# Patient Record
Sex: Female | Born: 1960 | Race: Black or African American | Hispanic: No | Smoking: Never smoker
Health system: Southern US, Community
[De-identification: ages and names within clinical notes are randomized; demographics above are authoritative.]

## PROBLEM LIST (undated history)

## (undated) DIAGNOSIS — E785 Hyperlipidemia, unspecified: Secondary | ICD-10-CM

## (undated) DIAGNOSIS — M5136 Other intervertebral disc degeneration, lumbar region: Secondary | ICD-10-CM

## (undated) DIAGNOSIS — H15009 Unspecified scleritis, unspecified eye: Secondary | ICD-10-CM

## (undated) DIAGNOSIS — M5030 Other cervical disc degeneration, unspecified cervical region: Secondary | ICD-10-CM

## (undated) DIAGNOSIS — E119 Type 2 diabetes mellitus without complications: Secondary | ICD-10-CM

## (undated) DIAGNOSIS — J302 Other seasonal allergic rhinitis: Secondary | ICD-10-CM

## (undated) DIAGNOSIS — I10 Essential (primary) hypertension: Secondary | ICD-10-CM

## (undated) DIAGNOSIS — J45909 Unspecified asthma, uncomplicated: Secondary | ICD-10-CM

## (undated) DIAGNOSIS — M069 Rheumatoid arthritis, unspecified: Secondary | ICD-10-CM

## (undated) DIAGNOSIS — K219 Gastro-esophageal reflux disease without esophagitis: Secondary | ICD-10-CM

## (undated) HISTORY — PX: KNEE ARTHROSCOPY: SHX127

## (undated) HISTORY — DX: Hyperlipidemia, unspecified: E78.5

## (undated) HISTORY — DX: Gastro-esophageal reflux disease without esophagitis: K21.9

## (undated) HISTORY — DX: Other cervical disc degeneration, unspecified cervical region: M50.30

## (undated) HISTORY — DX: Rheumatoid arthritis, unspecified: M06.9

## (undated) HISTORY — PX: KNEE ARTHROPLASTY: SHX992

## (undated) HISTORY — DX: Other intervertebral disc degeneration, lumbar region: M51.36

## (undated) HISTORY — PX: CATARACT EXTRACTION W/ INTRAOCULAR LENS  IMPLANT, BILATERAL: SHX1307

## (undated) HISTORY — DX: Unspecified scleritis, unspecified eye: H15.009

## (undated) HISTORY — PX: MYOMECTOMY: SHX85

---
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
## 2006-02-17 ENCOUNTER — Emergency Department (HOSPITAL_COMMUNITY): Admission: EM | Admit: 2006-02-17 | Discharge: 2006-02-17 | Payer: Self-pay | Admitting: Emergency Medicine

## 2009-05-07 ENCOUNTER — Encounter: Admission: RE | Admit: 2009-05-07 | Discharge: 2009-05-07 | Payer: Self-pay | Admitting: Family Medicine

## 2010-12-28 ENCOUNTER — Encounter: Payer: 59 | Attending: Physical Medicine and Rehabilitation | Admitting: Physical Medicine and Rehabilitation

## 2010-12-28 ENCOUNTER — Other Ambulatory Visit: Payer: Self-pay | Admitting: Physical Medicine and Rehabilitation

## 2010-12-28 ENCOUNTER — Ambulatory Visit (HOSPITAL_COMMUNITY)
Admission: RE | Admit: 2010-12-28 | Discharge: 2010-12-28 | Disposition: A | Discharge status: Home / Self Care | Payer: 59 | Source: Ambulatory Visit | Attending: Physical Medicine and Rehabilitation | Admitting: Physical Medicine and Rehabilitation

## 2010-12-28 DIAGNOSIS — M79609 Pain in unspecified limb: Secondary | ICD-10-CM | POA: Insufficient documentation

## 2010-12-28 DIAGNOSIS — M542 Cervicalgia: Secondary | ICD-10-CM | POA: Insufficient documentation

## 2010-12-28 DIAGNOSIS — M25569 Pain in unspecified knee: Secondary | ICD-10-CM | POA: Insufficient documentation

## 2010-12-28 DIAGNOSIS — Z79899 Other long term (current) drug therapy: Secondary | ICD-10-CM | POA: Insufficient documentation

## 2010-12-28 DIAGNOSIS — M069 Rheumatoid arthritis, unspecified: Secondary | ICD-10-CM | POA: Insufficient documentation

## 2010-12-28 DIAGNOSIS — M25559 Pain in unspecified hip: Secondary | ICD-10-CM | POA: Insufficient documentation

## 2010-12-28 DIAGNOSIS — R52 Pain, unspecified: Secondary | ICD-10-CM

## 2010-12-28 DIAGNOSIS — M545 Low back pain, unspecified: Secondary | ICD-10-CM

## 2010-12-28 DIAGNOSIS — R209 Unspecified disturbances of skin sensation: Secondary | ICD-10-CM | POA: Insufficient documentation

## 2010-12-28 DIAGNOSIS — I10 Essential (primary) hypertension: Secondary | ICD-10-CM | POA: Insufficient documentation

## 2010-12-28 DIAGNOSIS — M25549 Pain in joints of unspecified hand: Secondary | ICD-10-CM

## 2010-12-28 DIAGNOSIS — M4000 Postural kyphosis, site unspecified: Secondary | ICD-10-CM | POA: Insufficient documentation

## 2010-12-28 DIAGNOSIS — R29898 Other symptoms and signs involving the musculoskeletal system: Secondary | ICD-10-CM | POA: Insufficient documentation

## 2010-12-28 DIAGNOSIS — M47812 Spondylosis without myelopathy or radiculopathy, cervical region: Secondary | ICD-10-CM | POA: Insufficient documentation
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
## 2011-01-05 ENCOUNTER — Other Ambulatory Visit: Payer: Self-pay | Admitting: Physical Medicine and Rehabilitation

## 2011-01-05 DIAGNOSIS — R52 Pain, unspecified: Secondary | ICD-10-CM

## 2011-01-11 ENCOUNTER — Encounter: Payer: 59 | Attending: Physical Medicine and Rehabilitation | Admitting: Physical Medicine and Rehabilitation

## 2011-01-11 ENCOUNTER — Ambulatory Visit: Payer: Self-pay | Admitting: Physical Medicine and Rehabilitation

## 2011-01-11 DIAGNOSIS — M5030 Other cervical disc degeneration, unspecified cervical region: Secondary | ICD-10-CM | POA: Insufficient documentation

## 2011-01-11 DIAGNOSIS — M25569 Pain in unspecified knee: Secondary | ICD-10-CM | POA: Insufficient documentation

## 2011-01-11 DIAGNOSIS — M542 Cervicalgia: Secondary | ICD-10-CM

## 2011-01-11 DIAGNOSIS — M25676 Stiffness of unspecified foot, not elsewhere classified: Secondary | ICD-10-CM | POA: Insufficient documentation

## 2011-01-11 DIAGNOSIS — R209 Unspecified disturbances of skin sensation: Secondary | ICD-10-CM

## 2011-01-11 DIAGNOSIS — M25673 Stiffness of unspecified ankle, not elsewhere classified: Secondary | ICD-10-CM | POA: Insufficient documentation

## 2011-01-11 DIAGNOSIS — M069 Rheumatoid arthritis, unspecified: Secondary | ICD-10-CM | POA: Insufficient documentation

## 2011-01-11 DIAGNOSIS — M79609 Pain in unspecified limb: Secondary | ICD-10-CM

## 2011-01-11 DIAGNOSIS — M25539 Pain in unspecified wrist: Secondary | ICD-10-CM | POA: Insufficient documentation

## 2011-01-11 DIAGNOSIS — M47812 Spondylosis without myelopathy or radiculopathy, cervical region: Secondary | ICD-10-CM | POA: Insufficient documentation

## 2011-01-11 DIAGNOSIS — G894 Chronic pain syndrome: Secondary | ICD-10-CM

## 2011-01-12 ENCOUNTER — Ambulatory Visit (HOSPITAL_COMMUNITY)
Admission: RE | Admit: 2011-01-12 | Discharge: 2011-01-12 | Disposition: A | Discharge status: Home / Self Care | Payer: 59 | Source: Ambulatory Visit | Attending: Physical Medicine and Rehabilitation | Admitting: Physical Medicine and Rehabilitation

## 2011-01-12 DIAGNOSIS — M542 Cervicalgia: Secondary | ICD-10-CM | POA: Insufficient documentation

## 2011-01-12 DIAGNOSIS — M47812 Spondylosis without myelopathy or radiculopathy, cervical region: Secondary | ICD-10-CM | POA: Insufficient documentation

## 2011-01-12 DIAGNOSIS — R52 Pain, unspecified: Secondary | ICD-10-CM

## 2011-01-12 DIAGNOSIS — M5030 Other cervical disc degeneration, unspecified cervical region: Secondary | ICD-10-CM | POA: Insufficient documentation

## 2011-01-25 ENCOUNTER — Ambulatory Visit (HOSPITAL_COMMUNITY)
Admission: RE | Admit: 2011-01-25 | Discharge: 2011-01-25 | Disposition: A | Discharge status: Home / Self Care | Payer: 59 | Source: Ambulatory Visit | Attending: Physical Medicine and Rehabilitation | Admitting: Physical Medicine and Rehabilitation

## 2011-01-25 ENCOUNTER — Other Ambulatory Visit: Payer: Self-pay | Admitting: Physical Medicine and Rehabilitation

## 2011-01-25 ENCOUNTER — Encounter: Payer: 59 | Attending: Physical Medicine and Rehabilitation | Admitting: Physical Medicine and Rehabilitation

## 2011-01-25 DIAGNOSIS — M47812 Spondylosis without myelopathy or radiculopathy, cervical region: Secondary | ICD-10-CM | POA: Insufficient documentation

## 2011-01-25 DIAGNOSIS — R52 Pain, unspecified: Secondary | ICD-10-CM

## 2011-01-25 DIAGNOSIS — G894 Chronic pain syndrome: Secondary | ICD-10-CM

## 2011-01-25 DIAGNOSIS — M542 Cervicalgia: Secondary | ICD-10-CM | POA: Insufficient documentation

## 2011-01-25 DIAGNOSIS — M25676 Stiffness of unspecified foot, not elsewhere classified: Secondary | ICD-10-CM | POA: Insufficient documentation

## 2011-01-25 DIAGNOSIS — M25569 Pain in unspecified knee: Secondary | ICD-10-CM | POA: Insufficient documentation

## 2011-01-25 DIAGNOSIS — M25539 Pain in unspecified wrist: Secondary | ICD-10-CM | POA: Insufficient documentation

## 2011-01-25 DIAGNOSIS — M25549 Pain in joints of unspecified hand: Secondary | ICD-10-CM

## 2011-01-25 DIAGNOSIS — M069 Rheumatoid arthritis, unspecified: Secondary | ICD-10-CM | POA: Insufficient documentation

## 2011-01-25 DIAGNOSIS — M7989 Other specified soft tissue disorders: Secondary | ICD-10-CM | POA: Insufficient documentation

## 2011-01-25 DIAGNOSIS — M25579 Pain in unspecified ankle and joints of unspecified foot: Secondary | ICD-10-CM | POA: Insufficient documentation

## 2011-01-25 DIAGNOSIS — M25673 Stiffness of unspecified ankle, not elsewhere classified: Secondary | ICD-10-CM | POA: Insufficient documentation

## 2011-01-26 NOTE — Assessment & Plan Note (Signed)
[name] is a very pleasant 50 year old African American woman who is the patient of Dr. Providence Lanius.  She also had been seeing Dr. Kellie Simmering, Rheumatology, however, he has discontinued seeing him.  She is back in today for brief recheck.  She has multiple pain complaints including cervicalgia, bilateral wrist pain, some ankle stiffness, left knee pain and cervicalgia.  She also was noted to have hyperreflexia in her upper extremities and cervical MRI was obtained to rule out evidence of myelopathy.  MRI was done January 12, 2011, and was reviewed with her.  There is no significant abnormal cord signal identified.  She does have cervical spondylosis and degenerative disk disease resulting in varying degrees with foraminal impingement C3-4, C4-5, C6-7, C7-T1 and T1-T2.  This was reviewed with her today.  Her average pain is about 7 on a scale of 10.  Sleep is fair.  Pain is worse with inactivity; improves with heat or ice.  She gets fair relief with her current medication.  She has been on prednisone per primary care.  She is not using any medications through [facility].  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  No changes with respect to bowel or bladder.  Denies depression, anxiety or suicidal ideation.  REVIEW OF SYSTEMS:  Otherwise negative.  PAST MEDICAL, SOCIAL AND FAMILY HISTORY:  Unchanged.  On exam, her blood pressure is 145/81, pulse 64, respirations 14 and 96% saturated on room air.  She is a well-developed and mildly obese [place] American woman who does not appear in any distress.  She is oriented x3. Speech is clear.  Affect is bright, alert, cooperative and pleasant. Follows commands without difficulty, answers my questions appropriately. Cranial nerves and coordination are intact.  Her reflexes are slightly brisk both upper and lower extremities.  Hoffmann sign is negative.  No clonus is  appreciated.  She reports no sensory deficits.  Her motor strength is quite good in both upper and lower extremities.  She has some mildly decreased range of motion in her neck and mild difficulty with tandem gait.  She has some mild joint thickening in both wrists, suggesting pannus also noted in her ankles.  She has some tenderness at her elbows.  The joints did not feel particularly warm and are not red today.  She does have some thickening, however.  She has had tenderness at the MCPs, DIPs and PIP joints today.  I do not appreciate any obvious swelling, however, or erythema in the hands.  IMPRESSION: 1. Suggestion of pannus involving wrist and ankle tenderness in the     hands, wrists, elbows and ankles. 2. History of rheumatoid arthritis. 3. Multilevel cervical spondylosis per recent MRI without cord     compression. 4. History of left knee pain without instability.  PLAN:  We will obtain some blood work today including uric acid, rheumatoid factor and ANA.  We will obtain a cervical collar for her to use intermittently at work.  I have asked her to follow back up with rheumatologist.  We will start her on some gabapentin for the cervicalgia/shoulder pain.  We started a low-dose 300 mg at night titrating up to 3 times a day.  I have also written for her to start water based arthritis program at the [facility] if she can find sometime to do that.  She is in agreement with these recommendations.  I have answered all her questions.     Wendy Woods  Wendy Woods, M.D. Electronically Signed    DMK/MedQ D:  01/25/2011 12:58:21  T:  01/25/2011 14:05:56  Job #:  [id number]

## 2011-02-20 ENCOUNTER — Encounter: Payer: 59 | Attending: Physical Medicine and Rehabilitation | Admitting: Physical Medicine and Rehabilitation

## 2011-02-20 DIAGNOSIS — G894 Chronic pain syndrome: Secondary | ICD-10-CM

## 2011-02-20 DIAGNOSIS — M545 Low back pain, unspecified: Secondary | ICD-10-CM | POA: Insufficient documentation

## 2011-02-20 DIAGNOSIS — M1710 Unilateral primary osteoarthritis, unspecified knee: Secondary | ICD-10-CM

## 2011-02-20 DIAGNOSIS — M069 Rheumatoid arthritis, unspecified: Secondary | ICD-10-CM | POA: Insufficient documentation

## 2011-02-20 DIAGNOSIS — M47812 Spondylosis without myelopathy or radiculopathy, cervical region: Secondary | ICD-10-CM | POA: Insufficient documentation

## 2011-02-20 DIAGNOSIS — M25539 Pain in unspecified wrist: Secondary | ICD-10-CM

## 2011-02-20 DIAGNOSIS — M542 Cervicalgia: Secondary | ICD-10-CM | POA: Insufficient documentation

## 2011-02-20 DIAGNOSIS — M25569 Pain in unspecified knee: Secondary | ICD-10-CM | POA: Insufficient documentation

## 2011-02-21 NOTE — Assessment & Plan Note (Signed)
[name] is a very pleasant 50 year old African American woman who is a patient of Dr. Providence Lanius.  She has also seen Dr. Kellie Simmering for rheumatoid arthritis, however she has discontinued seeing him.  She tells me today that she does have an appointment set up with another rheumatologist in the next couple of weeks.  She is back in today and reports somewhat of an improvement in her wrist. She does have continued neck pain, occasional low back pain, and her left knee, although it is somewhat achy, is somewhat improved from last visit as well.  Her average pain scores are about 5 on a scale of 10, which is somewhat of an improvement from previous month which was a 7 on a scale of 10.  Her sleep is fair.  She reports good relief with the current medications.  She was trialed on gabapentin 300 mg 3 times a day.  She found this to be somewhat helpful, especially with her neck pain.  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  She is independent with self-care, living independently.  She can climb stairs.  She is driving.  No new problems with respect to bowel or bladder, numbness, tremors, tingling.  Denies depression, anxiety, or suicidal ideation.  She does report slight weight gain.  No other change in past medical, social, or family history.  PHYSICAL EXAMINATION:  VITAL SIGNS:  On exam, her blood pressure is 148/77, pulse 69, respirations 18, 96% saturated on room air. GENERAL:  She is a well-developed, mildly obese, African American woman who does not appear in any distress. NEUROLOGIC:  She is oriented x3.  Speech is clear.  Affect is bright. She is alert, cooperative, and pleasant.  Follows commands without difficulty.  Answers my questions appropriately.  Cranial nerves, coordination are intact.  Her reflexes are slightly brisk, both upper and lower extremities.  Hoffmann sign is negative.  No clonus is noted. No sensory deficits are  appreciated. MUSCULOSKELETAL: She has good strength in both upper and lower extremities.  Her range in neck motion as seems somewhat improved today. Tandem gait and Romberg test are performed adequately. Her joints are evaluated, she has some mild synovial thickening in both wrists which seems somewhat improved from previous visit.  Also at the ankles, a little tenderness is noted in her lateral elbow region.  None of the joints are warm or red.  Bilateral hand radiographs were done January 25, 2011, showing some mild soft tissue swelling about the wrist bilaterally.  Blood work which was done on January 25, 2011, sed rate was slightly elevated at 36.  Uric acid was within normal limits at 6.3, normal range 2.4 to 7.0, rheumatoid factor elevated 41, normal less than 14 and ANA was negative.  IMPRESSION: 1. Pannus involvement in the wrist and ankles, possible elbows. 2. History of rheumatoid arthritis. 3. Multilevel cervical spondylosis per MRI without cord compression. 4. History of left knee pain, overall improved.  PLAN:  I encouraged her to follow up with Rheumatology for further evaluation and possible treatment.  I will refill her gabapentin today, increase it slightly from 300 mg t.i.d. to 300 mg q.i.d.  She understands risks and benefits of this medication as well as side effects.  I will see her back in a month.  I have answered all her questions.  She is comfortable with our plan at this time.     Brantley Stage, M.D. Electronically Signed  DMK/MedQ D:  02/20/2011 12:00:08  T:  02/21/2011 01:30:44  Job #:  [id number]

## 2011-03-03 ENCOUNTER — Ambulatory Visit (HOSPITAL_COMMUNITY)
Admission: RE | Admit: 2011-03-03 | Discharge: 2011-03-03 | Disposition: A | Discharge status: Home / Self Care | Payer: 59 | Source: Ambulatory Visit | Attending: Rheumatology | Admitting: Rheumatology

## 2011-03-03 ENCOUNTER — Other Ambulatory Visit (HOSPITAL_COMMUNITY): Payer: Self-pay | Admitting: Rheumatology

## 2011-03-03 DIAGNOSIS — M129 Arthropathy, unspecified: Secondary | ICD-10-CM | POA: Insufficient documentation

## 2011-03-03 DIAGNOSIS — M4120 Other idiopathic scoliosis, site unspecified: Secondary | ICD-10-CM | POA: Insufficient documentation

## 2011-03-03 DIAGNOSIS — Z0189 Encounter for other specified special examinations: Secondary | ICD-10-CM

## 2011-03-03 DIAGNOSIS — Z01818 Encounter for other preprocedural examination: Secondary | ICD-10-CM | POA: Insufficient documentation

## 2011-05-05 ENCOUNTER — Ambulatory Visit: Payer: 59 | Admitting: Physical Medicine and Rehabilitation

## 2011-05-12 ENCOUNTER — Encounter: Payer: 59 | Attending: Physical Medicine and Rehabilitation | Admitting: Physical Medicine and Rehabilitation

## 2011-09-30 ENCOUNTER — Encounter (HOSPITAL_COMMUNITY): Payer: Self-pay | Admitting: Emergency Medicine

## 2011-09-30 ENCOUNTER — Emergency Department (HOSPITAL_COMMUNITY)
Admission: EM | Admit: 2011-09-30 | Discharge: 2011-09-30 | Disposition: A | Discharge status: Home / Self Care | Payer: 59 | Attending: Emergency Medicine | Admitting: Emergency Medicine

## 2011-09-30 ENCOUNTER — Emergency Department (HOSPITAL_COMMUNITY): Payer: 59

## 2011-09-30 DIAGNOSIS — G5600 Carpal tunnel syndrome, unspecified upper limb: Secondary | ICD-10-CM

## 2011-09-30 DIAGNOSIS — I10 Essential (primary) hypertension: Secondary | ICD-10-CM | POA: Insufficient documentation

## 2011-09-30 HISTORY — DX: Essential (primary) hypertension: I10

## 2011-09-30 HISTORY — DX: Rheumatoid arthritis, unspecified: M06.9

## 2011-09-30 MED ORDER — HYDROCODONE-ACETAMINOPHEN 5-325 MG PO TABS
2.0000 | ORAL_TABLET | Freq: Once | ORAL | Status: AC
  Administered 2011-09-30: 2 via ORAL
  Filled 2011-09-30: qty 2

## 2011-09-30 NOTE — ED Notes (Signed)
Pt c/o right wrist pain that began Thursday. Pt is unsure of origin of pain and does not remember injuring the wrist. Pt is unable to move wrist due to pain.

## 2011-09-30 NOTE — Discharge Instructions (Signed)
Carpal Tunnel Syndrome The carpal tunnel is a narrow hollow area in the wrist. It is formed by the wrist bones and ligaments. Nerves, blood vessels, and tendons (cord like structures which attach muscle to bone) on the palm side (the side of your hand in the direction your fingers bend) of your hand pass through the carpal tunnel. Repeated wrist motion or certain diseases may cause swelling within the tunnel. (That is why these are called repetitive trauma (damage caused by over use) disorders. It is also a common problem in late pregnancy.) This swelling pinches the main nerve in the wrist (median nerve) and causes the painful condition called carpal tunnel syndrome. A feeling of "pins and needles" may be noticed in the fingers or hand; however, the entire arm may ache from this condition. Carpal tunnel syndrome may clear up by itself. Cortisone injections may help. Sometimes, an operation may be needed to free the pinched nerve. An electromyogram (a type of test) may be needed to confirm this diagnosis (learning what is wrong). This is a test which measures nerve conduction. The nerve conduction is usually slowed in a carpal tunnel syndrome. HOME CARE INSTRUCTIONS   If your caregiver prescribed medication to help reduce swelling, take as directed.   If you were given a splint to keep your wrist from bending, use it as instructed. It is important to wear the splint at night. Use the splint for as long as you have pain or numbness in your hand, arm or wrist. This may take 1 to 2 months.   If you have pain at night, it may help to rub or shake your hand, or elevate your hand above the level of your heart (the center of your chest).   It is important to give your wrist a rest by stopping the activities that are causing the problem. If your symptoms (problems) are work-related, you may need to talk to your employer about changing to a job that does not require using your wrist.   Only take over-the-counter  or prescription medicines for pain, discomfort, or fever as directed by your caregiver.   Following periods of extended use, particularly strenuous use, apply an ice pack wrapped in a towel to the anterior (palm) side of the affected wrist for 20 to 30 minutes. Repeat as needed three to four times per day. This will help reduce the swelling.   Follow all instructions for follow-up with your caregiver. This includes any orthopedic referrals, physical therapy, and rehabilitation. Any delay in obtaining necessary care could result in a delay or failure of your condition to heal.  SEEK IMMEDIATE MEDICAL CARE IF:   You are still having pain and numbness following a week of treatment.   You develop new, unexplained symptoms.   Your current symptoms are getting worse and are not helped or controlled with medications.  MAKE SURE YOU:   Understand these instructions.   Will watch your condition.   Will get help right away if you are not doing well or get worse.  Document Released: 04/14/2000 Document Revised: 04/06/2011 Document Reviewed: 03/03/2011 Exit[employer] Patient Information 2012 [employer]. 

## 2011-09-30 NOTE — ED Provider Notes (Signed)
History     CSN: [id number]  Arrival date & time 09/30/11  2124   First MD Initiated Contact with Patient 09/30/11 2220      Chief Complaint  Patient presents with  . Wrist Injury    (Consider location/radiation/quality/duration/timing/severity/associated sxs/prior treatment) HPI Comments: Patient comes in today with a chief complaint of pain of the right wrist for 2 days.  No acute injury or trauma.  She reports that she first noticed the pain after hearing a pop while throwing a football with her son.  She reports that the pain is associated with numbness of the 1st, 2nd, and 3rd digits.  She reports that her job involves a lot of typing and that several of her coworkers have been diagnosed with carpal tunnel.  She denies any erythema, edema, or warmth of the wrist.  No prior history of Gout or Arthritis.    Patient is a 51 y.o. female presenting with wrist pain. The history is provided by the patient.  Wrist Pain This is a new problem. Episode onset: two days ago. The problem has been gradually worsening. Associated symptoms include numbness. Pertinent negatives include no chills, fever, joint swelling or weakness. The symptoms are aggravated by bending. She has tried NSAIDs for the symptoms. The treatment provided mild relief.    Past Medical History  Diagnosis Date  . Hypertension   . Rheumatoid arthritis     History reviewed. No pertinent past surgical history.  History reviewed. No pertinent family history.  History  Substance Use Topics  . Smoking status: Not on file  . Smokeless tobacco: Not on file  . Alcohol Use:     OB History    Grav Para Term Preterm Abortions TAB SAB Ect Mult Living                  Review of Systems  Constitutional: Negative for fever and chills.  Respiratory: Negative for shortness of breath.   Musculoskeletal: Negative for joint swelling.  Skin: Negative for color change and wound.  Neurological: Positive for numbness. Negative for  weakness.    Allergies  Latex and Penicillins  Home Medications   Current Outpatient Rx  Name Route Sig Dispense Refill  . CIPROFLOXACIN HCL 0.3 % OP SOLN Both Eyes Place 1 drop into both eyes 4 (four) times daily.    Marland Kitchen ETANERCEPT 50 MG/ML [place] SOLN Subcutaneous Inject 50 mg into the skin once a week.    Marland Kitchen FERROUS FUMARATE 325 (106 FE) MG PO TABS Oral Take 1 tablet by mouth daily.    Marland Kitchen LISINOPRIL 10 MG PO TABS Oral Take 10 mg by mouth daily.      BP 154/76  Pulse 74  Temp(Src) 98.3 F (36.8 C) (Oral)  Resp 18  SpO2 98%  LMP 09/07/2011  Physical Exam  Nursing note and vitals reviewed. Constitutional: She appears well-developed and well-nourished. No distress.  HENT:  Head: Normocephalic and atraumatic.  Neck: Normal range of motion. Neck supple.  Cardiovascular: Normal rate, regular rhythm and normal heart sounds.   Pulses:      Radial pulses are 2+ on the right side, and 2+ on the left side.  Pulmonary/Chest: Effort normal and breath sounds normal.  Musculoskeletal:       Right elbow: She exhibits normal range of motion, no swelling and no deformity. no tenderness found.       Right wrist: She exhibits no bony tenderness, no swelling, no effusion and no deformity.  Positive Tinel's sign Positive Phalen's sign Pain with ROM of right wrist  Neurological: She is alert.  Skin: Skin is warm and dry. No bruising noted. She is not diaphoretic. No erythema.       No erythema, edema, or warmth to the touch of right wrist  Psychiatric: She has a normal mood and affect.    ED Course  Procedures (including critical care time)  Labs Reviewed - No data to display Dg Wrist Complete Right  09/30/2011  *RADIOLOGY REPORT*  Clinical Data: Ulnar right wrist pain after hearing an audible pop.  RIGHT WRIST - COMPLETE 3+ VIEW  Comparison: None.  Findings: Normal appearing bones and soft tissues without fracture or dislocation.  IMPRESSION: Normal examination.  Original Report  Authenticated By: Darrol Angel, M.D.     No diagnosis found.    MDM  Negative xray.  Numbness of the 2nd and 3rd digits along with Positive Tinel's sign and Phalen's sign is consistent with Carpal Tunnel Syndrome.  Along with patient's job that requires a lot of typing.  No erythema, swelling, or warmth of the wrist.  Patient given wrist splint and instructed to follow up with PCP.        Pascal Lux [place], PA-C 10/01/11 1147

## 2011-10-01 NOTE — ED Provider Notes (Signed)
Medical screening examination/treatment/procedure(s) were performed by non-physician practitioner and as supervising physician I was immediately available for consultation/collaboration.  Toy Baker, MD 10/01/11 [phone]

## 2011-10-19 ENCOUNTER — Other Ambulatory Visit: Payer: Self-pay | Admitting: Orthopedic Surgery

## 2011-10-19 DIAGNOSIS — M25532 Pain in left wrist: Secondary | ICD-10-CM

## 2011-10-27 ENCOUNTER — Ambulatory Visit
Admission: RE | Admit: 2011-10-27 | Discharge: 2011-10-27 | Disposition: A | Discharge status: Home / Self Care | Payer: 59 | Source: Ambulatory Visit | Attending: Orthopedic Surgery | Admitting: Orthopedic Surgery

## 2011-10-27 DIAGNOSIS — M25532 Pain in left wrist: Secondary | ICD-10-CM

## 2011-10-27 MED ORDER — IOHEXOL 180 MG/ML  SOLN: 1.5000 mL | Freq: Once | INTRAMUSCULAR | Status: AC | PRN

## 2012-07-03 ENCOUNTER — Encounter (HOSPITAL_COMMUNITY): Payer: Self-pay | Admitting: *Deleted

## 2012-07-03 ENCOUNTER — Observation Stay (HOSPITAL_COMMUNITY)
Admission: EM | Admit: 2012-07-03 | Discharge: 2012-07-06 | Disposition: A | Discharge status: Home / Self Care | Payer: 59 | Attending: Internal Medicine | Admitting: Internal Medicine

## 2012-07-03 DIAGNOSIS — T50905A Adverse effect of unspecified drugs, medicaments and biological substances, initial encounter: Secondary | ICD-10-CM

## 2012-07-03 DIAGNOSIS — IMO0002 Reserved for concepts with insufficient information to code with codable children: Secondary | ICD-10-CM | POA: Insufficient documentation

## 2012-07-03 DIAGNOSIS — E1100 Type 2 diabetes mellitus with hyperosmolarity without nonketotic hyperglycemic-hyperosmolar coma (NKHHC): Secondary | ICD-10-CM

## 2012-07-03 DIAGNOSIS — E871 Hypo-osmolality and hyponatremia: Secondary | ICD-10-CM | POA: Insufficient documentation

## 2012-07-03 DIAGNOSIS — E1101 Type 2 diabetes mellitus with hyperosmolarity with coma: Secondary | ICD-10-CM

## 2012-07-03 DIAGNOSIS — E119 Type 2 diabetes mellitus without complications: Secondary | ICD-10-CM | POA: Diagnosis present

## 2012-07-03 DIAGNOSIS — K219 Gastro-esophageal reflux disease without esophagitis: Secondary | ICD-10-CM | POA: Insufficient documentation

## 2012-07-03 DIAGNOSIS — M069 Rheumatoid arthritis, unspecified: Secondary | ICD-10-CM | POA: Diagnosis present

## 2012-07-03 DIAGNOSIS — E118 Type 2 diabetes mellitus with unspecified complications: Principal | ICD-10-CM | POA: Insufficient documentation

## 2012-07-03 DIAGNOSIS — I10 Essential (primary) hypertension: Secondary | ICD-10-CM | POA: Diagnosis present

## 2012-07-03 DIAGNOSIS — Z7952 Long term (current) use of systemic steroids: Secondary | ICD-10-CM

## 2012-07-03 LAB — LACTIC ACID, PLASMA: Lactic Acid, Venous: 2.4 mmol/L — ABNORMAL HIGH (ref 0.5–2.2)

## 2012-07-03 LAB — CBC WITH DIFFERENTIAL/PLATELET
Basophils Absolute: 0 10*3/uL (ref 0.0–0.1)
Basophils Relative: 0 % (ref 0–1)
Eosinophils Absolute: 0 10*3/uL (ref 0.0–0.7)
Eosinophils Relative: 0 % (ref 0–5)
HCT: 36.3 % (ref 36.0–46.0)
Hemoglobin: 13.3 g/dL (ref 12.0–15.0)
Lymphocytes Relative: 22 % (ref 12–46)
Lymphs Abs: 4 10*3/uL (ref 0.7–4.0)
MCH: 31.6 pg (ref 26.0–34.0)
MCHC: 36.6 g/dL — ABNORMAL HIGH (ref 30.0–36.0)
MCV: 86.2 fL (ref 78.0–100.0)
Monocytes Absolute: 0.8 10*3/uL (ref 0.1–1.0)
Monocytes Relative: 4 % (ref 3–12)
Neutro Abs: 13.5 10*3/uL — ABNORMAL HIGH (ref 1.7–7.7)
Neutrophils Relative %: 74 % (ref 43–77)
Platelets: 401 10*3/uL — ABNORMAL HIGH (ref 150–400)
RBC: 4.21 MIL/uL (ref 3.87–5.11)
RDW: 13.1 % (ref 11.5–15.5)
WBC: 18.3 10*3/uL — ABNORMAL HIGH (ref 4.0–10.5)

## 2012-07-03 LAB — COMPREHENSIVE METABOLIC PANEL
ALT: 19 U/L (ref 0–35)
AST: 10 U/L (ref 0–37)
Albumin: 4.3 g/dL (ref 3.5–5.2)
Alkaline Phosphatase: 67 U/L (ref 39–117)
BUN: 32 mg/dL — ABNORMAL HIGH (ref 6–23)
CO2: 28 mEq/L (ref 19–32)
Calcium: 11.4 mg/dL — ABNORMAL HIGH (ref 8.4–10.5)
Chloride: 80 mEq/L — ABNORMAL LOW (ref 96–112)
Creatinine, Ser: 1.07 mg/dL (ref 0.50–1.10)
GFR calc Af Amer: 68 mL/min — ABNORMAL LOW (ref >90)
GFR calc non Af Amer: 59 mL/min — ABNORMAL LOW (ref >90)
Glucose, Bld: 664 mg/dL (ref 70–99)
Potassium: 4.4 mEq/L (ref 3.5–5.1)
Sodium: 124 mEq/L — ABNORMAL LOW (ref 135–145)
Total Bilirubin: 0.3 mg/dL (ref 0.3–1.2)
Total Protein: 8.5 g/dL — ABNORMAL HIGH (ref 6.0–8.3)

## 2012-07-03 LAB — GLUCOSE, CAPILLARY: Glucose-Capillary: 403 mg/dL — ABNORMAL HIGH (ref 70–99)

## 2012-07-03 LAB — POCT I-STAT 3, VENOUS BLOOD GAS (G3P V)
Acid-Base Excess: 7 mmol/L — ABNORMAL HIGH (ref 0.0–2.0)
Bicarbonate: 32.8 mEq/L — ABNORMAL HIGH (ref 20.0–24.0)
O2 Saturation: 61 %
TCO2: 34 mmol/L (ref 0–100)
pCO2, Ven: 49.8 mmHg (ref 45.0–50.0)
pH, Ven: 7.426 — ABNORMAL HIGH (ref 7.250–7.300)
pO2, Ven: 32 mmHg (ref 30.0–45.0)

## 2012-07-03 LAB — URINALYSIS, ROUTINE W REFLEX MICROSCOPIC
Bilirubin Urine: NEGATIVE
Glucose, UA: >1000 mg/dL — AB
Ketones, ur: NEGATIVE mg/dL
Leukocytes, UA: NEGATIVE
Nitrite: NEGATIVE
Protein, ur: NEGATIVE mg/dL
Specific Gravity, Urine: 1.035 — ABNORMAL HIGH (ref 1.005–1.030)
Urobilinogen, UA: 0.2 mg/dL (ref 0.0–1.0)
pH: 5 (ref 5.0–8.0)

## 2012-07-03 LAB — URINE MICROSCOPIC-ADD ON

## 2012-07-03 LAB — PREGNANCY, URINE: Preg Test, Ur: NEGATIVE

## 2012-07-03 MED ORDER — METFORMIN HCL 500 MG PO TABS
500.0000 mg | ORAL_TABLET | Freq: Two times a day (BID) | ORAL | Status: DC
  Administered 2012-07-04 – 2012-07-06 (×5): 500 mg via ORAL
  Filled 2012-07-03 (×7): qty 1

## 2012-07-03 MED ORDER — GLIPIZIDE 5 MG PO TABS
5.0000 mg | ORAL_TABLET | Freq: Every day | ORAL | Status: DC
  Administered 2012-07-04 – 2012-07-06 (×3): 5 mg via ORAL
  Filled 2012-07-03 (×4): qty 1

## 2012-07-03 MED ORDER — ACETAMINOPHEN 500 MG PO TABS
1000.0000 mg | ORAL_TABLET | ORAL | Status: DC | PRN
  Administered 2012-07-04 – 2012-07-05 (×5): 1000 mg via ORAL
  Filled 2012-07-03 (×6): qty 2

## 2012-07-03 MED ORDER — SODIUM CHLORIDE 0.9 % IV BOLUS (SEPSIS)
1000.0000 mL | Freq: Once | INTRAVENOUS | Status: AC
  Administered 2012-07-03: 1000 mL via INTRAVENOUS

## 2012-07-03 MED ORDER — METHOTREXATE 2.5 MG PO TABS: 2.5000 mg | ORAL_TABLET | ORAL | Status: DC

## 2012-07-03 MED ORDER — PREDNISONE 10 MG PO TABS: 10.0000 mg | ORAL_TABLET | Freq: Every day | ORAL | Status: DC

## 2012-07-03 MED ORDER — INSULIN ASPART 100 UNIT/ML ~~LOC~~ SOLN
0.0000 [IU] | Freq: Every day | SUBCUTANEOUS | Status: DC
  Administered 2012-07-03: 5 [IU] via SUBCUTANEOUS
  Administered 2012-07-04: 3 [IU] via SUBCUTANEOUS

## 2012-07-03 MED ORDER — ONDANSETRON HCL 4 MG PO TABS: 4.0000 mg | ORAL_TABLET | Freq: Four times a day (QID) | ORAL | Status: DC | PRN

## 2012-07-03 MED ORDER — INSULIN REGULAR HUMAN 100 UNIT/ML IJ SOLN
INTRAMUSCULAR | Status: DC
  Filled 2012-07-03: qty 1

## 2012-07-03 MED ORDER — SODIUM CHLORIDE 0.9 % IJ SOLN: 3.0000 mL | Freq: Two times a day (BID) | INTRAMUSCULAR | Status: DC

## 2012-07-03 MED ORDER — PREDNISONE 5 MG PO TABS: 15.0000 mg | ORAL_TABLET | Freq: Every day | ORAL | Status: DC

## 2012-07-03 MED ORDER — INSULIN ASPART 100 UNIT/ML ~~LOC~~ SOLN
0.0000 [IU] | Freq: Three times a day (TID) | SUBCUTANEOUS | Status: DC
  Administered 2012-07-04: 7 [IU] via SUBCUTANEOUS
  Administered 2012-07-04 – 2012-07-05 (×3): 11 [IU] via SUBCUTANEOUS

## 2012-07-03 MED ORDER — PREDNISONE 20 MG PO TABS: 20.0000 mg | ORAL_TABLET | Freq: Every day | ORAL | Status: DC

## 2012-07-03 MED ORDER — ONDANSETRON HCL 4 MG/2ML IJ SOLN
4.0000 mg | Freq: Four times a day (QID) | INTRAMUSCULAR | Status: DC | PRN
  Administered 2012-07-03 – 2012-07-04 (×2): 4 mg via INTRAVENOUS
  Filled 2012-07-03 (×2): qty 2

## 2012-07-03 MED ORDER — POTASSIUM CHLORIDE IN NACL 20-0.9 MEQ/L-% IV SOLN
INTRAVENOUS | Status: DC
  Administered 2012-07-03: 1000 mL via INTRAVENOUS
  Administered 2012-07-04 – 2012-07-05 (×3): via INTRAVENOUS
  Filled 2012-07-03 (×6): qty 1000

## 2012-07-03 MED ORDER — PREDNISONE 5 MG PO TABS
25.0000 mg | ORAL_TABLET | Freq: Every day | ORAL | Status: DC
  Administered 2012-07-04 – 2012-07-06 (×3): 25 mg via ORAL
  Filled 2012-07-03 (×5): qty 1

## 2012-07-03 MED ORDER — ONDANSETRON HCL 4 MG/2ML IJ SOLN
4.0000 mg | Freq: Once | INTRAMUSCULAR | Status: AC
  Administered 2012-07-03: 4 mg via INTRAVENOUS
  Filled 2012-07-03: qty 2

## 2012-07-03 MED ORDER — LISINOPRIL 40 MG PO TABS
40.0000 mg | ORAL_TABLET | Freq: Every day | ORAL | Status: DC
  Administered 2012-07-04 – 2012-07-05 (×2): 40 mg via ORAL
  Filled 2012-07-03 (×3): qty 1

## 2012-07-03 NOTE — Progress Notes (Signed)
Report obtained from [facility], RN in ED.  Requested pt's b.s. Be obtained again d/t last b.s. Being greater than 650.

## 2012-07-03 NOTE — H&P (Signed)
[facility] History and Physical  [name] WUJ:[id number] DOB: 1961-04-08    PCP:   None.  She goes to the urgent care.  Has a rheumatologist.   Chief Complaint: Blurry vision.  HPI: Wendy Woods is an 52 y.o. female with history of rheumatoid arthritis on chronic tapering steroids currently on 5mg  per day, hx of HTN, sent in from the ER as she was found to have BS over 600.  She denied any chest pain or shortness of breath, lightheadedness, fever or chills.  Evaluation in the ER showed BS 664, Cr of 1.07, Na 124, normal LFTs and bicarb of 28 with ABG PH of 7.42.  She was also found to have BP 150/105.  Hospitalist was asked to admit patient for steroid induced hyperglycemia.  Rewiew of Systems:  Constitutional: Negative for malaise, fever and chills. No significant weight loss or weight gain Eyes: Negative for eye pain, redness and discharge, diplopia,  or flashes of light. ENMT: Negative for ear pain, hoarseness, nasal congestion, sinus pressure and sore throat. No headaches; tinnitus, drooling, or problem swallowing. Cardiovascular: Negative for chest pain, palpitations, diaphoresis, dyspnea and peripheral edema. ; No orthopnea, PND Respiratory: Negative for cough, hemoptysis, wheezing and stridor. No pleuritic chestpain. Gastrointestinal: Negative for nausea, vomiting, diarrhea, constipation, abdominal pain, melena, blood in stool, hematemesis, jaundice and rectal bleeding.    Genitourinary: Negative for frequency, dysuria, incontinence,flank pain and hematuria; Musculoskeletal: Negative for back pain and neck pain. Negative for swelling and trauma.;  Skin: . Negative for pruritus, rash, abrasions, bruising and skin lesion.; ulcerations Neuro: Negative for headache, lightheadedness and neck stiffness. Negative for weakness, altered level of consciousness , altered mental status, extremity weakness, burning feet, involuntary movement, seizure and syncope.  Psych: negative for  anxiety, depression, insomnia, tearfulness, panic attacks, hallucinations, paranoia, suicidal or homicidal ideation.   Past Medical History  Diagnosis Date  . Hypertension   . Rheumatoid arthritis     History reviewed. No pertinent past surgical history.  Medications:  HOME MEDS: Prior to Admission medications   Medication Sig Start Date End Date Taking? Authorizing Provider  acetaminophen (TYLENOL) 500 MG tablet Take 1,000 mg by mouth every 6 (six) hours as needed for pain.   Yes Historical Provider, MD  lisinopril-hydrochlorothiazide (PRINZIDE,ZESTORETIC) 20-25 MG per tablet Take 1 tablet by mouth daily.   Yes Historical Provider, MD  methotrexate (RHEUMATREX) 2.5 MG tablet Take 2.5 mg by mouth once a week. Caution:Chemotherapy. Protect from light.   Yes Historical Provider, MD  predniSONE (DELTASONE) 5 MG tablet Take 10-40 mg by mouth daily. Started 8 tabs daily for 2 weeks; then decrease to 7 tabs daily for 2 weeks, then decrease to 6 tabs daily for 2 weeks, continue to decrease by 1 tablet daily every 2 weeks until goal of 2 tablets daily (continuously) is achieved.  Currently at 5 tablets daily for 2 weeks dose @ 07/03/12.   Yes Historical Provider, MD  sodium chloride 0.9 % SOLN with inFLIXimab 100 MG SOLR Inject 5 mg/kg into the vein every 8 (eight) weeks.   Yes Historical Provider, MD     Allergies:  Allergies  Allergen Reactions  . Latex Anaphylaxis, Hives, Itching, Swelling, Rash and Other (See Comments)    Causes bad reactions.    . Penicillins Anaphylaxis, Hives, Itching, Nausea And Vomiting, Rash and Other (See Comments)    Loss of consciousness also.     Social History:   reports that she has never smoked. She does not have any smokeless  tobacco history on file. She reports that she does not drink alcohol. Her drug history is not on file.  Family History: No family history on file.   Physical Exam: Filed Vitals:   07/03/12 1630 07/03/12 2030 07/03/12 2116  BP:  129/91 140/73 155/78  Pulse: 105 101 105  Temp: 97.9 F (36.6 C)    Resp: 20 16 14   SpO2: 98% 98% 99%   Blood pressure 155/78, pulse 105, temperature 97.9 F (36.6 C), resp. rate 14, last menstrual period 07/03/2012, SpO2 99.00%.  GEN:  Pleasant patient lying in the stretcher in no acute distress; cooperative with exam. PSYCH:  alert and oriented x4; does not appear anxious or depressed; affect is appropriate. HEENT: Mucous membranes pink and anicteric; PERRLA; EOM intact; no cervical lymphadenopathy nor thyromegaly or carotid bruit; no JVD; There were no stridor. Neck is very supple. Breasts:: Not examined CHEST WALL: No tenderness CHEST: Normal respiration, clear to auscultation bilaterally.  HEART: Regular rate and rhythm.  There are no murmur, rub, or gallops.   BACK: No kyphosis or scoliosis; no CVA tenderness ABDOMEN: soft and non-tender; no masses, no organomegaly, normal abdominal bowel sounds; no pannus; no intertriginous candida. There is no rebound and no distention. Rectal Exam: Not done EXTREMITIES: No bone or joint deformity; age-appropriate arthropathy of the hands and knees; no edema; no ulcerations.  There is no calf tenderness. Genitalia: not examined PULSES: 2+ and symmetric SKIN: Normal hydration no rash or ulceration CNS: Cranial nerves 2-12 grossly intact no focal lateralizing neurologic deficit.  Speech is fluent; uvula elevated with phonation, facial symmetry and tongue midline. DTR are normal bilaterally, cerebella exam is intact, barbinski is negative and strengths are equaled bilaterally.  No sensory loss.   Labs on Admission:  Basic Metabolic Panel:  Recent Labs Lab 07/03/12 1636  NA 124*  K 4.4  CL 80*  CO2 28  GLUCOSE 664*  BUN 32*  CREATININE 1.07  CALCIUM 11.4*   Liver Function Tests:  Recent Labs Lab 07/03/12 1636  AST 10  ALT 19  ALKPHOS 67  BILITOT 0.3  PROT 8.5*  ALBUMIN 4.3   No results found for this basename: LIPASE, AMYLASE,   in the last 168 hours No results found for this basename: AMMONIA,  in the last 168 hours CBC:  Recent Labs Lab 07/03/12 1636  WBC 18.3*  NEUTROABS 13.5*  HGB 13.3  HCT 36.3  MCV 86.2  PLT 401*   Cardiac Enzymes: No results found for this basename: CKTOTAL, CKMB, CKMBINDEX, TROPONINI,  in the last 168 hours  CBG: No results found for this basename: GLUCAP,  in the last 168 hours   Radiological Exams on Admission: No results found.   Assessment/Plan Present on Admission:  . Type 2 diabetes mellitus with hyperosmolar nonketotic hyperglycemia . HTN (hypertension) . Rheumatoid arthritis Hypercalcemia Volume depletion.  PLAN:  She clearly is volume depleted and is having hyperosmolar hyperglycemia.  Her elevated Calcium is from the HCTZ.  Her BP is elevated as well,  and she has been on Zestreretic.  Will admit her for IVF, give SSI, and start her on Metformin along with Glipizide.  For her BP, will continue the lisinopril and d/c the HCTZ.  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  She is stable, full code, and will be admitted to [facility] service.  I have consulted diabetic teaching as well.  Thank you for allowing me to partake  in the care of your patient.  Other plans as per orders.  Code Status:  Full code.   Houston Siren, MD. [facility] Pager [phone] 7pm to 7am.  07/03/2012, 9:58 PM

## 2012-07-03 NOTE — ED Notes (Signed)
Waiting on insulin to be delivered from pharmacy; per ED protocol, insulin drip to be started after NS liter bolus completed.

## 2012-07-03 NOTE — ED Notes (Signed)
Per LAB GLUCOSE 664

## 2012-07-03 NOTE — ED Notes (Signed)
Calling report now. 

## 2012-07-03 NOTE — ED Notes (Signed)
CBG 403 

## 2012-07-03 NOTE — ED Notes (Signed)
Report given to Melody, RN on 6N.  No further questions/concerns.  Informed RN that she can call back with any questions/concerns once patient arrives to floor.  Preparing patient for transport.

## 2012-07-03 NOTE — ED Notes (Signed)
The pt  Was seen at her eye doctor and her blood glucose was in the 600s.  She takes prednisone and she has not been diagnosed with diabetes

## 2012-07-03 NOTE — ED Notes (Signed)
Patient reconnected to monitor after ambulated back from restroom; patient denies any other needs at this time.

## 2012-07-03 NOTE — ED Notes (Signed)
Patient given water and warm blankets, per request.

## 2012-07-03 NOTE — ED Notes (Signed)
Resident currently at bedside assessing patient.

## 2012-07-03 NOTE — ED Provider Notes (Signed)
History     CSN: [id number]  Arrival date & time 07/03/12  1626   First MD Initiated Contact with Patient 07/03/12 1901      Chief Complaint  Patient presents with  . Hyperglycemia    HPI 52 year old female presents in the emergency department after referral from her ophthalmologist for concern of hyperglycemia. Patient seeing ophthalmology for chronic blurry vision and cataracts. At ophthalmologist glucose was checked and found to be greater than 600. Patient on prednisone. Ophthalmologist spoke with rheumatologist and patient referred here for further evaluation. Review of systems she notes that she has had polyuria and polydipsia for the last week. No history of diabetes. Not taking any diabetes medications. He does take prednisone for rheumatoid arthritis.  Past Medical History  Diagnosis Date  . Hypertension   . Rheumatoid arthritis     History reviewed. No pertinent past surgical history.  Family History: Reviewed.  None pertinent.    History  Substance Use Topics  . Smoking status: Never Smoker   . Smokeless tobacco: Not on file  . Alcohol Use: No    OB History   Grav Para Term Preterm Abortions TAB SAB Ect Mult Living                  Review of Systems  Constitutional: Negative for fever and chills.  HENT: Negative for congestion, rhinorrhea, neck pain and neck stiffness.   Respiratory: Negative for cough and shortness of breath.   Cardiovascular: Negative for chest pain.  Gastrointestinal: Negative for nausea, vomiting, abdominal pain, diarrhea and abdominal distention.  Endocrine: Positive for polydipsia, polyphagia and polyuria.  Genitourinary: Negative for dysuria.  Skin: Negative for rash.  Neurological: Negative for headaches.  Psychiatric/Behavioral: Negative.   All other systems reviewed and are negative.    Allergies  Latex and Penicillins  Home Medications   Current Outpatient Rx  Name  Route  Sig  Dispense  Refill  . acetaminophen  (TYLENOL) 500 MG tablet   Oral   Take 1,000 mg by mouth every 6 (six) hours as needed for pain.         Marland Kitchen lisinopril-hydrochlorothiazide (PRINZIDE,ZESTORETIC) 20-25 MG per tablet   Oral   Take 1 tablet by mouth daily.         . methotrexate (RHEUMATREX) 2.5 MG tablet   Oral   Take 2.5 mg by mouth once a week. Caution:Chemotherapy. Protect from light.         . predniSONE (DELTASONE) 5 MG tablet   Oral   Take 10-40 mg by mouth daily. Started 8 tabs daily for 2 weeks; then decrease to 7 tabs daily for 2 weeks, then decrease to 6 tabs daily for 2 weeks, continue to decrease by 1 tablet daily every 2 weeks until goal of 2 tablets daily (continuously) is achieved.  Currently at 5 tablets daily for 2 weeks dose @ 07/03/12.         . sodium chloride 0.9 % SOLN with inFLIXimab 100 MG SOLR   Intravenous   Inject 5 mg/kg into the vein every 8 (eight) weeks.           BP 129/91  Pulse 105  Temp(Src) 97.9 F (36.6 C)  Resp 20  SpO2 98%  LMP 07/03/2012  Physical Exam  Nursing note and vitals reviewed. Constitutional: She is oriented to person, place, and time. She appears well-developed and well-nourished. No distress.  HENT:  Head: Normocephalic and atraumatic.  Right Ear: External ear normal.  Left Ear:  External ear normal.  Nose: Nose normal.  Mouth/Throat: Oropharynx is clear and moist. No oropharyngeal exudate.  Eyes: EOM are normal. Pupils are equal, round, and reactive to light.  Neck: Normal range of motion. Neck supple. No tracheal deviation present.  Cardiovascular: Normal rate.   Pulmonary/Chest: Effort normal and breath sounds normal. No stridor. No respiratory distress. She has no wheezes. She has no rales.  Abdominal: Soft. She exhibits no distension. There is no tenderness. There is no rebound.  Musculoskeletal: Normal range of motion.  Neurological: She is alert and oriented to person, place, and time.  Skin: Skin is warm and dry. She is not diaphoretic.     ED Course  Procedures (including critical care time)  Labs Reviewed  URINALYSIS, ROUTINE W REFLEX MICROSCOPIC - Abnormal; Notable for the following:    Specific Gravity, Urine 1.035 (*)    Glucose, UA >1000 (*)    Hgb urine dipstick TRACE (*)    All other components within normal limits  CBC WITH DIFFERENTIAL - Abnormal; Notable for the following:    WBC 18.3 (*)    MCHC 36.6 (*)    Platelets 401 (*)    Neutro Abs 13.5 (*)    All other components within normal limits  COMPREHENSIVE METABOLIC PANEL - Abnormal; Notable for the following:    Sodium 124 (*)    Chloride 80 (*)    Glucose, Bld 664 (*)    BUN 32 (*)    Calcium 11.4 (*)    Total Protein 8.5 (*)    GFR calc non Af Amer 59 (*)    GFR calc Af Amer 68 (*)    All other components within normal limits  LACTIC ACID, PLASMA - Abnormal; Notable for the following:    Lactic Acid, Venous 2.4 (*)    All other components within normal limits  POCT I-STAT 3, BLOOD GAS (G3P V) - Abnormal; Notable for the following:    pH, Ven 7.426 (*)    Bicarbonate 32.8 (*)    Acid-Base Excess 7.0 (*)    All other components within normal limits  PREGNANCY, URINE  URINE MICROSCOPIC-ADD ON  BLOOD GAS, VENOUS   No results found.   1. Type 2 diabetes mellitus with hyperosmolar nonketotic hyperglycemia   2. Chronic use of steroids   3. HTN (hypertension)   4. 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   5. Rheumatoid arthritis     MDM   52 year old female presents to the emergency department with new-onset diabetes. History of rheumatoid arthritis on prednisone. Labs remarkable for a glucose of 664 with gap of 16. VBG not indicating acidosis.  Contraction alkalosis present.  Patient rehydrated with normal saline. Hospitalists consulted for admission.  Patient admitted.  No other acute concerns while in ED.       Arloa Koh, MD 07/03/12 2211

## 2012-07-03 NOTE — ED Notes (Signed)
Received bedside report from [place], Charity fundraiser.  Patient currently resting quietly in bed; no respiratory or acute distress noted.  Patient updated on plan of care; informed patient that EDP has made consult to internal medicine.  Patient denies any other needs at this time; will continue to monitor.

## 2012-07-04 LAB — COMPREHENSIVE METABOLIC PANEL
BUN: 23 mg/dL (ref 6–23)
CO2: 26 mEq/L (ref 19–32)
Calcium: 8.8 mg/dL (ref 8.4–10.5)
Creatinine, Ser: 0.9 mg/dL (ref 0.50–1.10)
GFR calc Af Amer: 84 mL/min — ABNORMAL LOW (ref >90)
GFR calc non Af Amer: 73 mL/min — ABNORMAL LOW (ref >90)
Glucose, Bld: 304 mg/dL — ABNORMAL HIGH (ref 70–99)
Total Protein: 5.8 g/dL — ABNORMAL LOW (ref 6.0–8.3)

## 2012-07-04 LAB — HEMOGLOBIN A1C
Hgb A1c MFr Bld: 12.9 % — ABNORMAL HIGH (ref <5.7)
Mean Plasma Glucose: 324 mg/dL — ABNORMAL HIGH (ref <117)

## 2012-07-04 LAB — GLUCOSE, CAPILLARY
Glucose-Capillary: 283 mg/dL — ABNORMAL HIGH (ref 70–99)
Glucose-Capillary: 223 mg/dL — ABNORMAL HIGH (ref 70–99)

## 2012-07-04 LAB — CBC
Hemoglobin: 10.5 g/dL — ABNORMAL LOW (ref 12.0–15.0)
MCHC: 35.7 g/dL (ref 30.0–36.0)
RBC: 3.39 MIL/uL — ABNORMAL LOW (ref 3.87–5.11)
WBC: 13.8 10*3/uL — ABNORMAL HIGH (ref 4.0–10.5)

## 2012-07-04 MED ORDER — INSULIN ASPART 100 UNIT/ML ~~LOC~~ SOLN
5.0000 [IU] | Freq: Three times a day (TID) | SUBCUTANEOUS | Status: DC
  Administered 2012-07-04 – 2012-07-06 (×3): 5 [IU] via SUBCUTANEOUS

## 2012-07-04 MED ORDER — PANTOPRAZOLE SODIUM 40 MG PO TBEC
40.0000 mg | DELAYED_RELEASE_TABLET | Freq: Two times a day (BID) | ORAL | Status: DC
  Administered 2012-07-04 – 2012-07-06 (×4): 40 mg via ORAL
  Filled 2012-07-04 (×4): qty 1

## 2012-07-04 MED ORDER — METHOTREXATE 2.5 MG PO TABS
10.0000 mg | ORAL_TABLET | ORAL | Status: DC
  Administered 2012-07-05: 10 mg via ORAL
  Filled 2012-07-04: qty 4

## 2012-07-04 MED ORDER — LIVING WELL WITH DIABETES BOOK
Freq: Once | Status: DC
  Filled 2012-07-04: qty 1

## 2012-07-04 MED ORDER — INSULIN GLARGINE 100 UNIT/ML ~~LOC~~ SOLN
15.0000 [IU] | Freq: Every day | SUBCUTANEOUS | Status: DC
  Administered 2012-07-04 – 2012-07-05 (×2): 15 [IU] via SUBCUTANEOUS

## 2012-07-04 NOTE — Progress Notes (Signed)
RN did some diabetic teaching with pt.  Pt given several exit care notes:  Diabetes and Standards of Medical Care, Diabetic Neuropathy, FAQs, and Meal Planning Guide.  Will cont to monitor.

## 2012-07-04 NOTE — Progress Notes (Signed)
[facility] PROGRESS NOTE  Wendy Woods ZOX:[id number] DOB: January 22, 1961 DOA: 07/03/2012 PCP: Pcp Not In System  Assessment/Plan: Type 2 diabetes with hyperosmolar nonketotic hyperglycemia worsened due to steroids. Patient on glipizide.  Continue metformin.  Sliding scale insulin.  Hemoglobin A1c pending.  If hemoglobin A1c greater than 10 will likely need insulin.  Continue diabetic diet.  Hypertension Stable.  Continue  Hyponatremia Likely due to hyperosmolar hyperglycemia, improved with hydration and blood sugar control.  Hypercalcemia Resolved with hydration.  Dehydration/volume depletion Improved with hydration.  Headaches/blurry vision Likely due to uncontrolled diabetes.  Continue to monitor.  Neurologically intact.  Rheumatoid arthritis Continue prednisone.  Management as per her rheumatologist.  GERD Start PPI.  Code Status: Full code. Family Communication: No family present. Disposition Plan: Pending.   Consultants:  None  Procedures:  None.  Antibiotics:  None.  HPI/Subjective: Complaining of headache with blurry vision which she has had for the last few days.  Also has been complaining of heartburn symptoms for over the last week.  Objective: Filed Vitals:   07/03/12 2030 07/03/12 2116 07/03/12 2230 07/04/12 0515  BP: 140/73 155/78 159/95 107/69  Pulse: 101 105 111 100  Temp:   98 F (36.7 C) 98.3 F (36.8 C)  TempSrc:   Oral Oral  Resp: 16 14 16 18   SpO2: 98% 99% 99% 99%    Intake/Output Summary (Last 24 hours) at 07/04/12 1347 Last data filed at 07/04/12 0400  Gross per 24 hour  Intake 1428.33 ml  Output      0 ml  Net 1428.33 ml   There were no vitals filed for this visit.  Exam: Physical Exam: General: Awake, Oriented, No acute distress. HEENT: EOMI. Neck: Supple CV: S1 and S2 Lungs: Clear to ascultation bilaterally Abdomen: Soft, Nontender, Nondistended, +bowel sounds. Ext: Good pulses. Trace edema.  Data  Reviewed: Basic Metabolic Panel:  Recent Labs Lab 07/03/12 1636 07/04/12 0635  NA 124* 132*  K 4.4 3.9  CL 80* 95*  CO2 28 26  GLUCOSE 664* 304*  BUN 32* 23  CREATININE 1.07 0.90  CALCIUM 11.4* 8.8   Liver Function Tests:  Recent Labs Lab 07/03/12 1636 07/04/12 0635  AST 10 9  ALT 19 12  ALKPHOS 67 43  BILITOT 0.3 0.3  PROT 8.5* 5.8*  ALBUMIN 4.3 2.7*   No results found for this basename: LIPASE, AMYLASE,  in the last 168 hours No results found for this basename: AMMONIA,  in the last 168 hours CBC:  Recent Labs Lab 07/03/12 1636 07/04/12 0635  WBC 18.3* 13.8*  NEUTROABS 13.5*  --   HGB 13.3 10.5*  HCT 36.3 29.4*  MCV 86.2 86.7  PLT 401* 292   Cardiac Enzymes: No results found for this basename: CKTOTAL, CKMB, CKMBINDEX, TROPONINI,  in the last 168 hours BNP (last 3 results) No results found for this basename: PROBNP,  in the last 8760 hours CBG:  Recent Labs Lab 07/03/12 2201 07/04/12 0754 07/04/12 1153  GLUCAP 403* 283* 277*    No results found for this or any previous visit (from the past 240 hour(s)).   Studies: No results found.  Scheduled Meds: . glipiZIDE  5 mg Oral QAC breakfast  . insulin aspart  0-20 Units Subcutaneous TID WC  . insulin aspart  0-5 Units Subcutaneous QHS  . lisinopril  40 mg Oral Daily  . metFORMIN  500 mg Oral BID WC  . [START ON 07/05/2012] methotrexate  10 mg Oral Weekly  . pantoprazole  40 mg Oral BID AC  . predniSONE  25 mg Oral Q breakfast   Followed by  . [START ON 07/14/2012] predniSONE  20 mg Oral Q breakfast   Followed by  . [START ON 07/28/2012] predniSONE  15 mg Oral Q breakfast   Followed by  . [START ON 08/11/2012] predniSONE  10 mg Oral Q breakfast  . sodium chloride  3 mL Intravenous Q12H   Continuous Infusions: . 0.9 % NaCl with KCl 20 mEq / L 100 mL/hr at 07/04/12 0945    Principal Problem:   Type 2 diabetes mellitus with hyperosmolar nonketotic hyperglycemia Active Problems:   HTN  (hypertension)   Rheumatoid arthritis   Chronic use of steroids   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    REDDY,SRIKAR A, MD  [facility] Pager [phone]. If 7PM-7AM, please contact night-coverage at [email or web address], password [facility] 07/04/2012, 1:47 PM  LOS: 1 day

## 2012-07-04 NOTE — Progress Notes (Signed)
Inpatient Diabetes Program Recommendations  AACE/ADA: New Consensus Statement on Inpatient Glycemic Control (2013)  Target Ranges:  Prepandial:   less than 140 mg/dL      Peak postprandial:   less than 180 mg/dL (1-2 hours)      Critically ill patients:  140 - 180 mg/dL  Results for MATILYNN, DACEY (MRN [id number]) as of 07/04/2012 16:20  Ref. Range 07/03/2012 22:01 07/04/2012 07:54 07/04/2012 11:53  Glucose-Capillary Latest Range: 70-99 mg/dL 782 (H) 956 (H) 213 (H)   Inpatient Diabetes Program Recommendations Insulin - Basal: Start Lantus 10 units Diabetes Coordinator spoke with patient concerning A1C=12.9 and no previous diagnosis of DM.  Patient states that this is all new to her.   She understands that steroids plays a role but that she will have to manage her glucose at home.  Discussed getting a glucose meter for home use and going to the [facility] (Nutrition and Diabetes Management Center).  She is willing to go for OP education.  Also discussed the possibility of going home on insulin.  Patient understands that it may be necessary and maybe temporarily.  RN has begun basic bedside education. Will follow. Thank you  Piedad Climes BSN, RN,CDE Inpatient Diabetes Coordinator [phone] (team pager)

## 2012-07-05 LAB — CBC
MCH: 31.3 pg (ref 26.0–34.0)
MCHC: 35.4 g/dL (ref 30.0–36.0)
Platelets: 264 10*3/uL (ref 150–400)
RBC: 3.1 MIL/uL — ABNORMAL LOW (ref 3.87–5.11)
RDW: 13.3 % (ref 11.5–15.5)

## 2012-07-05 LAB — BASIC METABOLIC PANEL
Calcium: 8.6 mg/dL (ref 8.4–10.5)
GFR calc Af Amer: >90 mL/min (ref >90)
GFR calc non Af Amer: 80 mL/min — ABNORMAL LOW (ref >90)
Sodium: 133 mEq/L — ABNORMAL LOW (ref 135–145)

## 2012-07-05 LAB — GLUCOSE, CAPILLARY
Glucose-Capillary: 186 mg/dL — ABNORMAL HIGH (ref 70–99)
Glucose-Capillary: 281 mg/dL — ABNORMAL HIGH (ref 70–99)

## 2012-07-05 MED ORDER — INSULIN ASPART 100 UNIT/ML ~~LOC~~ SOLN
0.0000 [IU] | Freq: Three times a day (TID) | SUBCUTANEOUS | Status: DC
  Administered 2012-07-05: 5 [IU] via SUBCUTANEOUS
  Administered 2012-07-06: 3 [IU] via SUBCUTANEOUS

## 2012-07-05 MED ORDER — INSULIN ASPART 100 UNIT/ML ~~LOC~~ SOLN: 0.0000 [IU] | Freq: Every day | SUBCUTANEOUS | Status: DC

## 2012-07-05 NOTE — Progress Notes (Signed)
Inpatient Diabetes Program Recommendations  AACE/ADA: New Consensus Statement on Inpatient Glycemic Control (2013)  Target Ranges:  Prepandial:   less than 140 mg/dL      Peak postprandial:   less than 180 mg/dL (1-2 hours)      Critically ill patients:  140 - 180 mg/dL    Order placed for Outpatient DM education at the [facility] Nutrition and DM Management center today per Dr. Jae Dire orders.  Ambrose Finland RN, MSN, CDE Diabetes Coordinator Inpatient Diabetes Program [phone]

## 2012-07-05 NOTE — Progress Notes (Signed)
  RD consulted for nutrition education regarding diabetes.   Lab Results  Component Value Date   HGBA1C 12.9* 07/04/2012    RD provided "Carbohydrate Counting for People with Diabetes" handout from the Academy of Nutrition and Dietetics. Discussed different food groups and their effects on blood sugar, emphasizing carbohydrate-containing foods. Provided list of carbohydrates and recommended serving sizes of common foods.  Discussed importance of controlled and consistent carbohydrate intake throughout the day. Provided examples of ways to balance meals/snacks and encouraged intake of high-fiber, whole grain complex carbohydrates.  Also discussed wt management and diabetes. Teach back method used.  Expect good compliance. Pt with appropriate and well-thought questions.  She reports strong desire for self-care and improvement in blood sugars.   Current diet order is CHO Modified Med, patient is consuming approximately 75% of meals at this time per her report. Labs and medications reviewed. No further nutrition interventions warranted at this time. RD contact information provided. If additional nutrition issues arise, please re-consult RD.  Loyce Dys, MS RD LDN Clinical Inpatient Dietitian Pager: [phone] Weekend/After hours pager: [phone]

## 2012-07-05 NOTE — Progress Notes (Signed)
[facility] PROGRESS NOTE  Jessika Rothery WUJ:[id number] DOB: 01-28-1961 DOA: 07/03/2012 PCP: Pcp Not In System  Assessment/Plan: Type 2 diabetes with hyperosmolar nonketotic hyperglycemia worsened due to steroids. Patient on glipizide.  Continue metformin.  Sliding scale insulin.  Hemoglobin A1c 12.9, suggesting an average blood sugar of 324.  Continue diabetic diet.  Started on Lantus 15 units each bedtime, NovoLog 5 units with meals.  Patient had relative hypoglycemia with a blood sugar of 82 and was symptomatic, changed resistant sliding scale to sensitive SSI. Suspect as patient is weaned of prednisone her insulin requirements may decline. Will arrange for outpatient diabetes education.  Hypertension Stable.  Continue  Hyponatremia Likely due to hyperosmolar hyperglycemia, improved with hydration and blood sugar control.  Hypercalcemia Resolved with hydration.  Dehydration/volume depletion Improved with hydration.  Headaches/blurry vision Likely due to uncontrolled diabetes.  Continue to monitor.  Neurologically intact.  Rheumatoid arthritis Continue prednisone.  Management as per her rheumatologist.  GERD Start PPI.  Code Status: Full code. Family Communication: No family present. Disposition Plan: Pending. Likely plan for discharge tomorrow.   Consultants:  None  Procedures:  None.  Antibiotics:  None.  HPI/Subjective: Was symptomatic with a blood sugar on 82. No other specific concerns.  Objective: Filed Vitals:   07/04/12 0515 07/04/12 1414 07/04/12 2136 07/05/12 0525  BP: 107/69 144/83 115/85 127/83  Pulse: 100 109 98 97  Temp: 98.3 F (36.8 C) 98.2 F (36.8 C) 98.4 F (36.9 C) 98.1 F (36.7 C)  TempSrc: Oral Oral Oral Oral  Resp: 18 16 18 16   SpO2: 99% 98% 98% 100%    Intake/Output Summary (Last 24 hours) at 07/05/12 1317 Last data filed at 07/05/12 0900  Gross per 24 hour  Intake   2040 ml  Output      0 ml  Net   2040 ml   There  were no vitals filed for this visit.  Exam: Physical Exam: General: Awake, Oriented, No acute distress. HEENT: EOMI. Neck: Supple CV: S1 and S2 Lungs: Clear to ascultation bilaterally Abdomen: Soft, Nontender, Nondistended, +bowel sounds. Ext: Good pulses. Trace edema.  Data Reviewed: Basic Metabolic Panel:  Recent Labs Lab 07/03/12 1636 07/04/12 0635 07/05/12 0432  NA 124* 132* 133*  K 4.4 3.9 4.4  CL 80* 95* 99  CO2 28 26 24   GLUCOSE 664* 304* 258*  BUN 32* 23 13  CREATININE 1.07 0.90 0.83  CALCIUM 11.4* 8.8 8.6   Liver Function Tests:  Recent Labs Lab 07/03/12 1636 07/04/12 0635  AST 10 9  ALT 19 12  ALKPHOS 67 43  BILITOT 0.3 0.3  PROT 8.5* 5.8*  ALBUMIN 4.3 2.7*   No results found for this basename: LIPASE, AMYLASE,  in the last 168 hours No results found for this basename: AMMONIA,  in the last 168 hours CBC:  Recent Labs Lab 07/03/12 1636 07/04/12 0635 07/05/12 0432  WBC 18.3* 13.8* 15.3*  NEUTROABS 13.5*  --   --   HGB 13.3 10.5* 9.7*  HCT 36.3 29.4* 27.4*  MCV 86.2 86.7 88.4  PLT 401* 292 264   Cardiac Enzymes: No results found for this basename: CKTOTAL, CKMB, CKMBINDEX, TROPONINI,  in the last 168 hours BNP (last 3 results) No results found for this basename: PROBNP,  in the last 8760 hours CBG:  Recent Labs Lab 07/04/12 0754 07/04/12 1153 07/04/12 1712 07/04/12 2140 07/05/12 0819  GLUCAP 283* 277* 223* 280* 254*    No results found for this or any previous visit (  from the past 240 hour(s)).   Studies: No results found.  Scheduled Meds: . glipiZIDE  5 mg Oral QAC breakfast  . insulin aspart  0-5 Units Subcutaneous QHS  . insulin aspart  0-9 Units Subcutaneous TID WC  . insulin aspart  5 Units Subcutaneous TID WC  . insulin glargine  15 Units Subcutaneous QHS  . lisinopril  40 mg Oral Daily  . living well with diabetes book   Does not apply Once  . metFORMIN  500 mg Oral BID WC  . methotrexate  10 mg Oral Weekly  .  pantoprazole  40 mg Oral BID AC  . predniSONE  25 mg Oral Q breakfast   Followed by  . [START ON 07/14/2012] predniSONE  20 mg Oral Q breakfast   Followed by  . [START ON 07/28/2012] predniSONE  15 mg Oral Q breakfast   Followed by  . [START ON 08/11/2012] predniSONE  10 mg Oral Q breakfast  . sodium chloride  3 mL Intravenous Q12H   Continuous Infusions:    Principal Problem:   Type 2 diabetes mellitus with hyperosmolar nonketotic hyperglycemia Active Problems:   HTN (hypertension)   Rheumatoid arthritis   Chronic use of steroids   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    Hanford Lust A, MD  [facility] Pager [phone]. If 7PM-7AM, please contact night-coverage at [email or web address], password [facility] 07/05/2012, 1:17 PM  LOS: 2 days

## 2012-07-06 MED ORDER — METFORMIN HCL 500 MG PO TABS: 500.0000 mg | ORAL_TABLET | Freq: Two times a day (BID) | ORAL | Status: DC

## 2012-07-06 MED ORDER — INSULIN GLARGINE 100 UNIT/ML ~~LOC~~ SOLN: 15.0000 [IU] | Freq: Every day | SUBCUTANEOUS | Status: DC

## 2012-07-06 MED ORDER — INSULIN ASPART 100 UNIT/ML ~~LOC~~ SOLN: 5.0000 [IU] | Freq: Three times a day (TID) | SUBCUTANEOUS | Status: DC

## 2012-07-06 MED ORDER — OMEPRAZOLE 20 MG PO CPDR: 20.0000 mg | DELAYED_RELEASE_CAPSULE | Freq: Every day | ORAL | Status: DC

## 2012-07-06 MED ORDER — GLIPIZIDE 5 MG PO TABS: 5.0000 mg | ORAL_TABLET | Freq: Every day | ORAL | Status: DC

## 2012-07-06 NOTE — Progress Notes (Signed)
Pt discharged to home by self, Rx given and explained.  Pt will obtain CBG meter for home use.  Pt will follow up with PCP and will monitor glucose at home.  Packet of information given and explained pt feels secure in her home self care.

## 2012-07-06 NOTE — Discharge Summary (Signed)
Physician Discharge Summary  Wendy Woods ONG:[id number] DOB: [birth year] DOA: 07/03/2012  PCP: [facility]  Admit date: 07/03/2012 Discharge date: 07/06/2012  Time spent: 25 minutes  Recommendations for Outpatient Follow-up:  Followup with [facility] (PCP) in 1 week.  Discharge Diagnoses:  Principal Problem:   Type 2 diabetes mellitus with hyperosmolar nonketotic hyperglycemia Active Problems:   HTN (hypertension)   Rheumatoid arthritis   Chronic use of steroids   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   Discharge Condition: Stable  Diet recommendation: Diabetic diet.  There were no vitals filed for this visit.  History of present illness:  On admission: "Wendy Woods is an 52 y.o. female with history of rheumatoid arthritis on chronic tapering steroids currently on 5mg  per day, hx of HTN, sent in from the ER as she was found to have BS over 600."  Hospital Course:  New diagnosis of type 2 diabetes with hyperosmolar nonketotic hyperglycemia worsened due to steroids. Patient on glipizide.  Continue metformin.  Sliding scale insulin.  Hemoglobin A1c 12.9, suggesting an average blood sugar of 324.  Continue diabetic diet.  Continue on Lantus 15 units each bedtime, NovoLog 5 units with meals.  Suspect as patient is weaned of prednisone her insulin requirements may decline. Will arrange for outpatient diabetes education. To followup with PCP in 1 week for further management.  Hypertension Stable.  Continue  Hyponatremia Likely due to hyperosmolar hyperglycemia, improved with hydration and blood sugar control.  Hypercalcemia Resolved with hydration.  Dehydration/volume depletion Improved with hydration.  Headaches/blurry vision Likely due to uncontrolled diabetes, improved during the hospital stay.  Continue to monitor.  Neurologically intact.  Rheumatoid arthritis Continue prednisone.  Management as per her rheumatologist.  GERD Continue PPI while on  steroids.  Consultants:  None  Procedures:  None.  Antibiotics:  None.  Discharge Exam: Filed Vitals:   07/05/12 1359 07/05/12 1828 07/05/12 2233 07/06/12 0623  BP: 120/73 140/90 111/64 111/51  Pulse: 82 88 71 71  Temp: 98.1 F (36.7 C) 98.3 F (36.8 C) 97.9 F (36.6 C) 98.6 F (37 C)  TempSrc:   Oral Oral  Resp: 18 18 18 15   SpO2: 98% 100% 100% 99%   Discharge Instructions      Discharge Orders   Future Orders Complete By Expires     Ambulatory referral to Nutrition and Diabetic Education  As directed     Comments:      New onset diabetes this admission.  A1c 12.9%.  May go home on insulin.    Diet Carb Modified  As directed     Discharge instructions  As directed     Comments:      Please followup with [facility] (PCP) in 1 week.    Increase activity slowly  As directed         Medication List    TAKE these medications       acetaminophen 500 MG tablet  Commonly known as:  TYLENOL  Take 1,000 mg by mouth every 6 (six) hours as needed for pain.     glipiZIDE 5 MG tablet  Commonly known as:  GLUCOTROL  Take 1 tablet (5 mg total) by mouth daily before breakfast.     insulin aspart 100 UNIT/ML injection  Commonly known as:  novoLOG  Inject 5 Units into the skin 3 (three) times daily with meals.     insulin glargine 100 UNIT/ML injection  Commonly known as:  LANTUS  Inject 15 Units into the skin at bedtime.  lisinopril-hydrochlorothiazide 20-25 MG per tablet  Commonly known as:  PRINZIDE,ZESTORETIC  Take 1 tablet by mouth daily.     metFORMIN 500 MG tablet  Commonly known as:  GLUCOPHAGE  Take 1 tablet (500 mg total) by mouth 2 (two) times daily with a meal.     methotrexate 2.5 MG tablet  Commonly known as:  RHEUMATREX  Take 10 mg by mouth once a week. Caution:Chemotherapy. Protect from light.     omeprazole 20 MG capsule  Commonly known as:  PRILOSEC  Take 1 capsule (20 mg total) by mouth daily.     predniSONE 5 MG tablet  Commonly  known as:  DELTASONE  Take 10-40 mg by mouth daily. Started 8 tabs daily for 2 weeks; then decrease to 7 tabs daily for 2 weeks, then decrease to 6 tabs daily for 2 weeks, continue to decrease by 1 tablet daily every 2 weeks until goal of 2 tablets daily (continuously) is achieved.  Currently at 5 tablets daily for 2 weeks dose @ 07/03/12.     sodium chloride 0.9 % SOLN with inFLIXimab 100 MG SOLR  Inject 5 mg/kg into the vein every 8 (eight) weeks.       Follow-up Information   Follow up with [facility]. Schedule an appointment as soon as possible for a visit in 1 week.       The results of significant diagnostics from this hospitalization (including imaging, microbiology, ancillary and laboratory) are listed below for reference.    Significant Diagnostic Studies: No results found.  Microbiology: No results found for this or any previous visit (from the past 240 hour(s)).   Labs: Basic Metabolic Panel:  Recent Labs Lab 07/03/12 1636 07/04/12 0635 07/05/12 0432  NA 124* 132* 133*  K 4.4 3.9 4.4  CL 80* 95* 99  CO2 28 26 24   GLUCOSE 664* 304* 258*  BUN 32* 23 13  CREATININE 1.07 0.90 0.83  CALCIUM 11.4* 8.8 8.6   Liver Function Tests:  Recent Labs Lab 07/03/12 1636 07/04/12 0635  AST 10 9  ALT 19 12  ALKPHOS 67 43  BILITOT 0.3 0.3  PROT 8.5* 5.8*  ALBUMIN 4.3 2.7*   No results found for this basename: LIPASE, AMYLASE,  in the last 168 hours No results found for this basename: AMMONIA,  in the last 168 hours CBC:  Recent Labs Lab 07/03/12 1636 07/04/12 0635 07/05/12 0432  WBC 18.3* 13.8* 15.3*  NEUTROABS 13.5*  --   --   HGB 13.3 10.5* 9.7*  HCT 36.3 29.4* 27.4*  MCV 86.2 86.7 88.4  PLT 401* 292 264   Cardiac Enzymes: No results found for this basename: CKTOTAL, CKMB, CKMBINDEX, TROPONINI,  in the last 168 hours BNP: BNP (last 3 results) No results found for this basename: PROBNP,  in the last 8760 hours CBG:  Recent Labs Lab 07/05/12 1242  07/05/12 1417 07/05/12 1724 07/05/12 2231 07/06/12 0759  GLUCAP 82 186* 281* 198* 221*       Signed:  REDDY,SRIKAR A  [facility] 07/06/2012, 8:44 AM

## 2012-07-06 NOTE — Progress Notes (Signed)
[facility] PROGRESS NOTE  Wendy Woods ZOX:[id number] DOB: 1960/08/12 DOA: 07/03/2012 PCP: [facility].  Assessment/Plan: Type 2 diabetes with hyperosmolar nonketotic hyperglycemia worsened due to steroids. Patient on glipizide.  Continue metformin.  Sliding scale insulin.  Hemoglobin A1c 12.9, suggesting an average blood sugar of 324.  Continue diabetic diet.  Continue on Lantus 15 units each bedtime, NovoLog 5 units with meals.  Suspect as patient is weaned of prednisone her insulin requirements may decline. Will arrange for outpatient diabetes education. To followup with PCP in 1 week for further management.  Hypertension Stable.  Continue  Hyponatremia Likely due to hyperosmolar hyperglycemia, improved with hydration and blood sugar control.  Hypercalcemia Resolved with hydration.  Dehydration/volume depletion Improved with hydration.  Headaches/blurry vision Likely due to uncontrolled diabetes.  Continue to monitor.  Neurologically intact.  Rheumatoid arthritis Continue prednisone.  Management as per her rheumatologist.  GERD Start PPI.  Code Status: Full code. Family Communication: No family present. Disposition Plan:DC home today.   Consultants:  None  Procedures:  None.  Antibiotics:  None.  HPI/Subjective: No specific concerns except about her diabetes.  Objective: Filed Vitals:   07/05/12 1359 07/05/12 1828 07/05/12 2233 07/06/12 0623  BP: 120/73 140/90 111/64 111/51  Pulse: 82 88 71 71  Temp: 98.1 F (36.7 C) 98.3 F (36.8 C) 97.9 F (36.6 C) 98.6 F (37 C)  TempSrc:   Oral Oral  Resp: 18 18 18 15   SpO2: 98% 100% 100% 99%    Intake/Output Summary (Last 24 hours) at 07/06/12 8119 Last data filed at 07/05/12 1459  Gross per 24 hour  Intake   1400 ml  Output      0 ml  Net   1400 ml   There were no vitals filed for this visit.  Exam: Physical Exam: General: Awake, Oriented, No acute distress. HEENT: EOMI. Neck: Supple CV: S1  and S2 Lungs: Clear to ascultation bilaterally Abdomen: Soft, Nontender, Nondistended, +bowel sounds. Ext: Good pulses. Trace edema.  Data Reviewed: Basic Metabolic Panel:  Recent Labs Lab 07/03/12 1636 07/04/12 0635 07/05/12 0432  NA 124* 132* 133*  K 4.4 3.9 4.4  CL 80* 95* 99  CO2 28 26 24   GLUCOSE 664* 304* 258*  BUN 32* 23 13  CREATININE 1.07 0.90 0.83  CALCIUM 11.4* 8.8 8.6   Liver Function Tests:  Recent Labs Lab 07/03/12 1636 07/04/12 0635  AST 10 9  ALT 19 12  ALKPHOS 67 43  BILITOT 0.3 0.3  PROT 8.5* 5.8*  ALBUMIN 4.3 2.7*   No results found for this basename: LIPASE, AMYLASE,  in the last 168 hours No results found for this basename: AMMONIA,  in the last 168 hours CBC:  Recent Labs Lab 07/03/12 1636 07/04/12 0635 07/05/12 0432  WBC 18.3* 13.8* 15.3*  NEUTROABS 13.5*  --   --   HGB 13.3 10.5* 9.7*  HCT 36.3 29.4* 27.4*  MCV 86.2 86.7 88.4  PLT 401* 292 264   Cardiac Enzymes: No results found for this basename: CKTOTAL, CKMB, CKMBINDEX, TROPONINI,  in the last 168 hours BNP (last 3 results) No results found for this basename: PROBNP,  in the last 8760 hours CBG:  Recent Labs Lab 07/05/12 1242 07/05/12 1417 07/05/12 1724 07/05/12 2231 07/06/12 0759  GLUCAP 82 186* 281* 198* 221*    No results found for this or any previous visit (from the past 240 hour(s)).   Studies: No results found.  Scheduled Meds: . glipiZIDE  5 mg Oral QAC  breakfast  . insulin aspart  0-5 Units Subcutaneous QHS  . insulin aspart  0-9 Units Subcutaneous TID WC  . insulin aspart  5 Units Subcutaneous TID WC  . insulin glargine  15 Units Subcutaneous QHS  . lisinopril  40 mg Oral Daily  . living well with diabetes book   Does not apply Once  . metFORMIN  500 mg Oral BID WC  . methotrexate  10 mg Oral Weekly  . pantoprazole  40 mg Oral BID AC  . predniSONE  25 mg Oral Q breakfast   Followed by  . [START ON 07/14/2012] predniSONE  20 mg Oral Q breakfast    Followed by  . [START ON 07/28/2012] predniSONE  15 mg Oral Q breakfast   Followed by  . [START ON 08/11/2012] predniSONE  10 mg Oral Q breakfast  . sodium chloride  3 mL Intravenous Q12H   Continuous Infusions:    Principal Problem:   Type 2 diabetes mellitus with hyperosmolar nonketotic hyperglycemia Active Problems:   HTN (hypertension)   Rheumatoid arthritis   Chronic use of steroids   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    REDDY,SRIKAR A, MD  [facility] Pager [phone]. If 7PM-7AM, please contact night-coverage at [email or web address], password [facility] 07/06/2012, 8:33 AM  LOS: 3 days

## 2012-07-11 NOTE — ED Provider Notes (Signed)
I saw and evaluated the patient, reviewed the resident's note and I agree with the findings and plan.   Raeford Razor, MD 07/11/12 [phone]

## 2012-08-06 ENCOUNTER — Encounter: Payer: 59 | Attending: Internal Medicine

## 2012-08-09 ENCOUNTER — Encounter: Payer: Self-pay | Admitting: *Deleted

## 2012-11-27 ENCOUNTER — Other Ambulatory Visit (HOSPITAL_COMMUNITY): Payer: Self-pay | Admitting: *Deleted

## 2012-11-28 ENCOUNTER — Encounter (HOSPITAL_COMMUNITY)
Admission: RE | Admit: 2012-11-28 | Discharge: 2012-11-28 | Disposition: A | Discharge status: Home / Self Care | Payer: 59 | Source: Ambulatory Visit | Attending: Rheumatology | Admitting: Rheumatology

## 2012-11-28 DIAGNOSIS — M069 Rheumatoid arthritis, unspecified: Secondary | ICD-10-CM | POA: Insufficient documentation

## 2012-11-28 LAB — CBC
Hemoglobin: 11.2 g/dL — ABNORMAL LOW (ref 12.0–15.0)
MCH: 30 pg (ref 26.0–34.0)
MCHC: 33.6 g/dL (ref 30.0–36.0)
MCV: 89.3 fL (ref 78.0–100.0)
Platelets: 473 10*3/uL — ABNORMAL HIGH (ref 150–400)
RBC: 3.73 MIL/uL — ABNORMAL LOW (ref 3.87–5.11)

## 2012-11-28 LAB — DIFFERENTIAL
Lymphs Abs: 1.9 10*3/uL (ref 0.7–4.0)
Monocytes Absolute: 0.3 10*3/uL (ref 0.1–1.0)
Monocytes Relative: 3 % (ref 3–12)
Neutro Abs: 7.7 10*3/uL (ref 1.7–7.7)
Neutrophils Relative %: 76 % (ref 43–77)

## 2012-11-28 LAB — ALBUMIN: Albumin: 3.3 g/dL — ABNORMAL LOW (ref 3.5–5.2)

## 2012-11-28 MED ORDER — DIPHENHYDRAMINE HCL 25 MG PO CAPS
ORAL_CAPSULE | ORAL | Status: AC
  Administered 2012-11-28: 25 mg
  Filled 2012-11-28: qty 1

## 2012-11-28 MED ORDER — ACETAMINOPHEN 325 MG PO TABS: 650.0000 mg | ORAL_TABLET | ORAL | Status: DC

## 2012-11-28 MED ORDER — SODIUM CHLORIDE 0.9 % IV SOLN: INTRAVENOUS | Status: DC

## 2012-11-28 MED ORDER — ACETAMINOPHEN 325 MG PO TABS
ORAL_TABLET | ORAL | Status: AC
  Administered 2012-11-28: 650 mg
  Filled 2012-11-28: qty 2

## 2012-11-28 MED ORDER — DIPHENHYDRAMINE HCL 25 MG PO TABS
25.0000 mg | ORAL_TABLET | ORAL | Status: DC
  Filled 2012-11-28: qty 1

## 2012-11-28 MED ORDER — SODIUM CHLORIDE 0.9 % IV SOLN
500.0000 mg | INTRAVENOUS | Status: DC
  Administered 2012-11-28: 500 mg via INTRAVENOUS
  Filled 2012-11-28: qty 50

## 2013-01-09 ENCOUNTER — Encounter (HOSPITAL_COMMUNITY)
Admission: RE | Admit: 2013-01-09 | Discharge: 2013-01-09 | Disposition: A | Discharge status: Home / Self Care | Payer: 59 | Source: Ambulatory Visit | Attending: Rheumatology | Admitting: Rheumatology

## 2013-01-09 ENCOUNTER — Other Ambulatory Visit (HOSPITAL_COMMUNITY): Payer: Self-pay | Admitting: *Deleted

## 2013-01-09 DIAGNOSIS — M069 Rheumatoid arthritis, unspecified: Secondary | ICD-10-CM | POA: Insufficient documentation

## 2013-01-09 MED ORDER — ACETAMINOPHEN 325 MG PO TABS: 650.0000 mg | ORAL_TABLET | ORAL | Status: DC

## 2013-01-09 MED ORDER — SODIUM CHLORIDE 0.9 % IV SOLN: INTRAVENOUS | Status: DC

## 2013-01-09 MED ORDER — DIPHENHYDRAMINE HCL 25 MG PO TABS: 25.0000 mg | ORAL_TABLET | ORAL | Status: DC

## 2013-01-09 MED ORDER — SODIUM CHLORIDE 0.9 % IV SOLN: 500.0000 mg | INTRAVENOUS | Status: DC

## 2013-01-09 NOTE — Progress Notes (Signed)
Patient's bp was high on admission today. Rechecked several times. Called Dr. Fatima Sanger office and reported to nurse  Misty Stanley. Orders received from Dr. Corliss Skains. Patient verbalizes understanding. Sent instructions home with patient in writing.

## 2013-01-23 ENCOUNTER — Encounter (HOSPITAL_COMMUNITY)
Admission: RE | Admit: 2013-01-23 | Discharge: 2013-01-23 | Disposition: A | Discharge status: Home / Self Care | Payer: 59 | Source: Ambulatory Visit | Attending: Rheumatology | Admitting: Rheumatology

## 2013-01-23 LAB — CREATININE, SERUM
Creatinine, Ser: 0.98 mg/dL (ref 0.50–1.10)
GFR calc non Af Amer: 66 mL/min — ABNORMAL LOW (ref >90)

## 2013-01-23 LAB — CBC
Hemoglobin: 12.8 g/dL (ref 12.0–15.0)
MCHC: 33 g/dL (ref 30.0–36.0)
Platelets: 485 10*3/uL — ABNORMAL HIGH (ref 150–400)
RBC: 4.47 MIL/uL (ref 3.87–5.11)

## 2013-01-23 LAB — ALT: ALT: 11 U/L (ref 0–35)

## 2013-01-23 LAB — DIFFERENTIAL
Basophils Absolute: 0 10*3/uL (ref 0.0–0.1)
Basophils Relative: 0 % (ref 0–1)
Eosinophils Absolute: 0 10*3/uL (ref 0.0–0.7)
Monocytes Absolute: 0.5 10*3/uL (ref 0.1–1.0)
Neutro Abs: 9.7 10*3/uL — ABNORMAL HIGH (ref 1.7–7.7)
Neutrophils Relative %: 77 % (ref 43–77)

## 2013-01-23 LAB — AST: AST: 13 U/L (ref 0–37)

## 2013-01-23 LAB — ALBUMIN: Albumin: 3.7 g/dL (ref 3.5–5.2)

## 2013-01-23 MED ORDER — ACETAMINOPHEN 325 MG PO TABS: 650.0000 mg | ORAL_TABLET | Freq: Once | ORAL | Status: DC

## 2013-01-23 MED ORDER — ACETAMINOPHEN 325 MG PO TABS
ORAL_TABLET | ORAL | Status: AC
  Administered 2013-01-23: 650 mg
  Filled 2013-01-23: qty 2

## 2013-01-23 MED ORDER — SODIUM CHLORIDE 0.9 % IV SOLN
500.0000 mg | Freq: Once | INTRAVENOUS | Status: AC
  Administered 2013-01-23: 500 mg via INTRAVENOUS
  Filled 2013-01-23: qty 50

## 2013-01-23 MED ORDER — DIPHENHYDRAMINE HCL 25 MG PO TABS
25.0000 mg | ORAL_TABLET | Freq: Once | ORAL | Status: DC
  Filled 2013-01-23: qty 1

## 2013-01-23 MED ORDER — DIPHENHYDRAMINE HCL 25 MG PO CAPS
ORAL_CAPSULE | ORAL | Status: AC
  Administered 2013-01-23: 25 mg
  Filled 2013-01-23: qty 1

## 2013-01-23 MED ORDER — SODIUM CHLORIDE 0.9 % IV SOLN
Freq: Once | INTRAVENOUS | Status: AC
  Administered 2013-01-23: 09:00:00 via INTRAVENOUS

## 2013-03-06 ENCOUNTER — Encounter (HOSPITAL_COMMUNITY)
Admission: RE | Admit: 2013-03-06 | Discharge: 2013-03-06 | Disposition: A | Discharge status: Home / Self Care | Payer: 59 | Source: Ambulatory Visit | Attending: Rheumatology | Admitting: Rheumatology

## 2013-03-06 ENCOUNTER — Other Ambulatory Visit: Payer: Self-pay

## 2013-03-06 DIAGNOSIS — M069 Rheumatoid arthritis, unspecified: Secondary | ICD-10-CM | POA: Insufficient documentation

## 2013-03-06 LAB — DIFFERENTIAL
Basophils Absolute: 0 10*3/uL (ref 0.0–0.1)
Eosinophils Absolute: 0.1 10*3/uL (ref 0.0–0.7)
Eosinophils Relative: 1 % (ref 0–5)
Lymphocytes Relative: 22 % (ref 12–46)
Neutrophils Relative %: 72 % (ref 43–77)

## 2013-03-06 LAB — CBC
MCH: 28.9 pg (ref 26.0–34.0)
MCV: 87.3 fL (ref 78.0–100.0)
Platelets: 391 10*3/uL (ref 150–400)
RDW: 14.8 % (ref 11.5–15.5)
WBC: 8.3 10*3/uL (ref 4.0–10.5)

## 2013-03-06 LAB — COMPREHENSIVE METABOLIC PANEL
ALT: 13 U/L (ref 0–35)
AST: 15 U/L (ref 0–37)
Alkaline Phosphatase: 36 U/L — ABNORMAL LOW (ref 39–117)
CO2: 26 mEq/L (ref 19–32)
Calcium: 9.3 mg/dL (ref 8.4–10.5)
Chloride: 100 mEq/L (ref 96–112)
GFR calc non Af Amer: 70 mL/min — ABNORMAL LOW (ref >90)
Glucose, Bld: 172 mg/dL — ABNORMAL HIGH (ref 70–99)
Potassium: 3.2 mEq/L — ABNORMAL LOW (ref 3.5–5.1)
Sodium: 138 mEq/L (ref 135–145)
Total Protein: 7.2 g/dL (ref 6.0–8.3)

## 2013-03-06 MED ORDER — DIPHENHYDRAMINE HCL 25 MG PO CAPS
ORAL_CAPSULE | ORAL | Status: AC
  Administered 2013-03-06: 25 mg via ORAL
  Filled 2013-03-06: qty 1

## 2013-03-06 MED ORDER — SODIUM CHLORIDE 0.9 % IV SOLN
INTRAVENOUS | Status: DC
  Administered 2013-03-06: 10:00:00 via INTRAVENOUS

## 2013-03-06 MED ORDER — DIPHENHYDRAMINE HCL 25 MG PO TABS
25.0000 mg | ORAL_TABLET | ORAL | Status: DC
  Administered 2013-03-06: 25 mg via ORAL
  Filled 2013-03-06: qty 1

## 2013-03-06 MED ORDER — ACETAMINOPHEN 325 MG PO TABS
650.0000 mg | ORAL_TABLET | ORAL | Status: DC
  Administered 2013-03-06: 650 mg via ORAL

## 2013-03-06 MED ORDER — SODIUM CHLORIDE 0.9 % IV SOLN
6.0000 mg/kg | INTRAVENOUS | Status: DC
  Administered 2013-03-06: 500 mg via INTRAVENOUS
  Filled 2013-03-06: qty 50

## 2013-03-06 MED ORDER — ACETAMINOPHEN 325 MG PO TABS
ORAL_TABLET | ORAL | Status: AC
  Filled 2013-03-06: qty 2

## 2013-04-16 ENCOUNTER — Other Ambulatory Visit (HOSPITAL_COMMUNITY): Payer: Self-pay | Admitting: *Deleted

## 2013-04-17 ENCOUNTER — Encounter (HOSPITAL_COMMUNITY)
Admission: RE | Admit: 2013-04-17 | Discharge: 2013-04-17 | Disposition: A | Discharge status: Home / Self Care | Payer: 59 | Source: Ambulatory Visit | Attending: Rheumatology | Admitting: Rheumatology

## 2013-04-17 DIAGNOSIS — M069 Rheumatoid arthritis, unspecified: Secondary | ICD-10-CM | POA: Insufficient documentation

## 2013-04-17 LAB — COMPREHENSIVE METABOLIC PANEL
Albumin: 3.6 g/dL (ref 3.5–5.2)
BUN: 13 mg/dL (ref 6–23)
Chloride: 99 mEq/L (ref 96–112)
Creatinine, Ser: 0.88 mg/dL (ref 0.50–1.10)
GFR calc non Af Amer: 75 mL/min — ABNORMAL LOW (ref >90)
Total Bilirubin: 0.3 mg/dL (ref 0.3–1.2)
Total Protein: 8 g/dL (ref 6.0–8.3)

## 2013-04-17 LAB — CBC WITH DIFFERENTIAL/PLATELET
Basophils Relative: 1 % (ref 0–1)
Eosinophils Absolute: 0.2 10*3/uL (ref 0.0–0.7)
Eosinophils Relative: 3 % (ref 0–5)
HCT: 39.1 % (ref 36.0–46.0)
Hemoglobin: 12.9 g/dL (ref 12.0–15.0)
Lymphs Abs: 1.8 10*3/uL (ref 0.7–4.0)
MCH: 28.5 pg (ref 26.0–34.0)
MCHC: 33 g/dL (ref 30.0–36.0)
Monocytes Absolute: 0.5 10*3/uL (ref 0.1–1.0)
Monocytes Relative: 6 % (ref 3–12)
Neutro Abs: 5.6 10*3/uL (ref 1.7–7.7)
Neutrophils Relative %: 68 % (ref 43–77)
RDW: 16 % — ABNORMAL HIGH (ref 11.5–15.5)
WBC: 8.1 10*3/uL (ref 4.0–10.5)

## 2013-04-17 MED ORDER — DIPHENHYDRAMINE HCL 25 MG PO TABS
25.0000 mg | ORAL_TABLET | ORAL | Status: DC
  Administered 2013-04-17: 25 mg via ORAL
  Filled 2013-04-17: qty 1

## 2013-04-17 MED ORDER — DIPHENHYDRAMINE HCL 25 MG PO CAPS
ORAL_CAPSULE | ORAL | Status: AC
  Filled 2013-04-17: qty 1

## 2013-04-17 MED ORDER — ACETAMINOPHEN 325 MG PO TABS
ORAL_TABLET | ORAL | Status: AC
  Filled 2013-04-17: qty 2

## 2013-04-17 MED ORDER — ACETAMINOPHEN 325 MG PO TABS
650.0000 mg | ORAL_TABLET | ORAL | Status: DC
  Administered 2013-04-17: 650 mg via ORAL

## 2013-04-17 MED ORDER — SODIUM CHLORIDE 0.9 % IV SOLN
6.0000 mg/kg | INTRAVENOUS | Status: DC
  Administered 2013-04-17: 500 mg via INTRAVENOUS
  Filled 2013-04-17: qty 50

## 2013-04-17 MED ORDER — SODIUM CHLORIDE 0.9 % IV SOLN
INTRAVENOUS | Status: DC
  Administered 2013-04-17: 08:00:00 via INTRAVENOUS

## 2013-05-29 ENCOUNTER — Encounter (HOSPITAL_COMMUNITY)
Admission: RE | Admit: 2013-05-29 | Discharge: 2013-05-29 | Disposition: A | Discharge status: Home / Self Care | Payer: 59 | Source: Ambulatory Visit | Attending: Rheumatology | Admitting: Rheumatology

## 2013-05-29 DIAGNOSIS — M069 Rheumatoid arthritis, unspecified: Secondary | ICD-10-CM | POA: Insufficient documentation

## 2013-05-29 LAB — DIFFERENTIAL
Basophils Absolute: 0.1 10*3/uL (ref 0.0–0.1)
Basophils Relative: 1 % (ref 0–1)
Eosinophils Absolute: 0.1 10*3/uL (ref 0.0–0.7)
Eosinophils Relative: 1 % (ref 0–5)
LYMPHS PCT: 45 % (ref 12–46)
Lymphs Abs: 5.1 10*3/uL — ABNORMAL HIGH (ref 0.7–4.0)
MONOS PCT: 6 % (ref 3–12)
Monocytes Absolute: 0.7 10*3/uL (ref 0.1–1.0)
NEUTROS PCT: 47 % (ref 43–77)
Neutro Abs: 5.4 10*3/uL (ref 1.7–7.7)

## 2013-05-29 LAB — COMPREHENSIVE METABOLIC PANEL
ALBUMIN: 3.6 g/dL (ref 3.5–5.2)
ALT: 12 U/L (ref 0–35)
AST: 14 U/L (ref 0–37)
Alkaline Phosphatase: 40 U/L (ref 39–117)
BILIRUBIN TOTAL: 0.3 mg/dL (ref 0.3–1.2)
BUN: 13 mg/dL (ref 6–23)
CHLORIDE: 98 meq/L (ref 96–112)
CO2: 29 mEq/L (ref 19–32)
CREATININE: 0.92 mg/dL (ref 0.50–1.10)
Calcium: 9.6 mg/dL (ref 8.4–10.5)
GFR calc Af Amer: 82 mL/min — ABNORMAL LOW (ref >90)
GFR, EST NON AFRICAN AMERICAN: 71 mL/min — AB (ref >90)
Glucose, Bld: 99 mg/dL (ref 70–99)
Potassium: 3.7 mEq/L (ref 3.7–5.3)
Sodium: 140 mEq/L (ref 137–147)
Total Protein: 7.8 g/dL (ref 6.0–8.3)

## 2013-05-29 LAB — CBC
HCT: 37.7 % (ref 36.0–46.0)
Hemoglobin: 12.8 g/dL (ref 12.0–15.0)
MCH: 30.5 pg (ref 26.0–34.0)
MCHC: 34 g/dL (ref 30.0–36.0)
MCV: 89.8 fL (ref 78.0–100.0)
PLATELETS: 475 10*3/uL — AB (ref 150–400)
RBC: 4.2 MIL/uL (ref 3.87–5.11)
RDW: 14.2 % (ref 11.5–15.5)
WBC: 11.4 10*3/uL — AB (ref 4.0–10.5)

## 2013-05-29 MED ORDER — DIPHENHYDRAMINE HCL 25 MG PO TABS
25.0000 mg | ORAL_TABLET | ORAL | Status: DC
  Administered 2013-05-29: 25 mg via ORAL
  Filled 2013-05-29: qty 1

## 2013-05-29 MED ORDER — SODIUM CHLORIDE 0.9 % IV SOLN
INTRAVENOUS | Status: DC
  Administered 2013-05-29: 09:00:00 via INTRAVENOUS

## 2013-05-29 MED ORDER — INFLIXIMAB 100 MG IV SOLR
6.0000 mg/kg | INTRAVENOUS | Status: DC
  Administered 2013-05-29: 500 mg via INTRAVENOUS
  Filled 2013-05-29: qty 50

## 2013-05-29 MED ORDER — DIPHENHYDRAMINE HCL 25 MG PO CAPS
ORAL_CAPSULE | ORAL | Status: AC
  Filled 2013-05-29: qty 1

## 2013-05-29 MED ORDER — ACETAMINOPHEN 325 MG PO TABS
ORAL_TABLET | ORAL | Status: AC
  Filled 2013-05-29: qty 2

## 2013-05-29 MED ORDER — ACETAMINOPHEN 325 MG PO TABS
650.0000 mg | ORAL_TABLET | ORAL | Status: DC
  Administered 2013-05-29: 650 mg via ORAL

## 2013-07-09 ENCOUNTER — Other Ambulatory Visit (HOSPITAL_COMMUNITY): Payer: Self-pay | Admitting: *Deleted

## 2013-07-10 ENCOUNTER — Encounter (HOSPITAL_COMMUNITY)
Admission: RE | Admit: 2013-07-10 | Discharge: 2013-07-10 | Disposition: A | Discharge status: Home / Self Care | Payer: 59 | Source: Ambulatory Visit | Attending: Rheumatology | Admitting: Rheumatology

## 2013-07-10 DIAGNOSIS — M069 Rheumatoid arthritis, unspecified: Secondary | ICD-10-CM | POA: Insufficient documentation

## 2013-07-10 LAB — DIFFERENTIAL
BASOS ABS: 0.1 10*3/uL (ref 0.0–0.1)
Basophils Relative: 1 % (ref 0–1)
Eosinophils Absolute: 0.2 10*3/uL (ref 0.0–0.7)
Eosinophils Relative: 2 % (ref 0–5)
Lymphocytes Relative: 48 % — ABNORMAL HIGH (ref 12–46)
Lymphs Abs: 4.7 10*3/uL — ABNORMAL HIGH (ref 0.7–4.0)
MONO ABS: 0.6 10*3/uL (ref 0.1–1.0)
Monocytes Relative: 6 % (ref 3–12)
NEUTROS ABS: 4.4 10*3/uL (ref 1.7–7.7)
Neutrophils Relative %: 44 % (ref 43–77)

## 2013-07-10 LAB — COMPREHENSIVE METABOLIC PANEL
ALT: 11 U/L (ref 0–35)
AST: 14 U/L (ref 0–37)
Albumin: 3.3 g/dL — ABNORMAL LOW (ref 3.5–5.2)
Alkaline Phosphatase: 33 U/L — ABNORMAL LOW (ref 39–117)
BUN: 16 mg/dL (ref 6–23)
CHLORIDE: 98 meq/L (ref 96–112)
CO2: 29 meq/L (ref 19–32)
Calcium: 9.6 mg/dL (ref 8.4–10.5)
Creatinine, Ser: 0.94 mg/dL (ref 0.50–1.10)
GFR, EST AFRICAN AMERICAN: 79 mL/min — AB (ref >90)
GFR, EST NON AFRICAN AMERICAN: 69 mL/min — AB (ref >90)
GLUCOSE: 103 mg/dL — AB (ref 70–99)
Potassium: 3.8 mEq/L (ref 3.7–5.3)
Sodium: 139 mEq/L (ref 137–147)
Total Bilirubin: <0.2 mg/dL — ABNORMAL LOW (ref 0.3–1.2)
Total Protein: 7.1 g/dL (ref 6.0–8.3)

## 2013-07-10 LAB — CBC
HEMATOCRIT: 36.9 % (ref 36.0–46.0)
HEMOGLOBIN: 12.3 g/dL (ref 12.0–15.0)
MCH: 30.3 pg (ref 26.0–34.0)
MCHC: 33.3 g/dL (ref 30.0–36.0)
MCV: 90.9 fL (ref 78.0–100.0)
Platelets: 371 10*3/uL (ref 150–400)
RBC: 4.06 MIL/uL (ref 3.87–5.11)
RDW: 13.3 % (ref 11.5–15.5)
WBC: 10 10*3/uL (ref 4.0–10.5)

## 2013-07-10 MED ORDER — DIPHENHYDRAMINE HCL 25 MG PO CAPS
25.0000 mg | ORAL_CAPSULE | ORAL | Status: DC
  Administered 2013-07-10: 25 mg via ORAL

## 2013-07-10 MED ORDER — SODIUM CHLORIDE 0.9 % IV SOLN
6.0000 mg/kg | INTRAVENOUS | Status: DC
  Administered 2013-07-10: 500 mg via INTRAVENOUS
  Filled 2013-07-10: qty 50

## 2013-07-10 MED ORDER — ACETAMINOPHEN 325 MG PO TABS
ORAL_TABLET | ORAL | Status: AC
  Filled 2013-07-10: qty 2

## 2013-07-10 MED ORDER — ACETAMINOPHEN 325 MG PO TABS
650.0000 mg | ORAL_TABLET | ORAL | Status: DC
  Administered 2013-07-10: 650 mg via ORAL

## 2013-07-10 MED ORDER — DIPHENHYDRAMINE HCL 25 MG PO CAPS
ORAL_CAPSULE | ORAL | Status: AC
  Filled 2013-07-10: qty 1

## 2013-07-10 MED ORDER — SODIUM CHLORIDE 0.9 % IV SOLN
INTRAVENOUS | Status: DC
  Administered 2013-07-10: 09:00:00 via INTRAVENOUS

## 2013-08-21 ENCOUNTER — Encounter (HOSPITAL_COMMUNITY)
Admission: RE | Admit: 2013-08-21 | Discharge: 2013-08-21 | Disposition: A | Discharge status: Home / Self Care | Payer: 59 | Source: Ambulatory Visit | Attending: Rheumatology | Admitting: Rheumatology

## 2013-08-21 DIAGNOSIS — M069 Rheumatoid arthritis, unspecified: Secondary | ICD-10-CM | POA: Insufficient documentation

## 2013-08-21 LAB — COMPREHENSIVE METABOLIC PANEL
ALK PHOS: 35 U/L — AB (ref 39–117)
ALT: 13 U/L (ref 0–35)
AST: 14 U/L (ref 0–37)
Albumin: 3.2 g/dL — ABNORMAL LOW (ref 3.5–5.2)
BUN: 13 mg/dL (ref 6–23)
CHLORIDE: 98 meq/L (ref 96–112)
CO2: 26 mEq/L (ref 19–32)
Calcium: 9.5 mg/dL (ref 8.4–10.5)
Creatinine, Ser: 0.86 mg/dL (ref 0.50–1.10)
GFR calc Af Amer: 88 mL/min — ABNORMAL LOW (ref >90)
GFR calc non Af Amer: 76 mL/min — ABNORMAL LOW (ref >90)
Glucose, Bld: 136 mg/dL — ABNORMAL HIGH (ref 70–99)
Potassium: 3.8 mEq/L (ref 3.7–5.3)
SODIUM: 138 meq/L (ref 137–147)
Total Protein: 7.1 g/dL (ref 6.0–8.3)

## 2013-08-21 LAB — CBC WITH DIFFERENTIAL/PLATELET
BASOS PCT: 0 % (ref 0–1)
Basophils Absolute: 0 10*3/uL (ref 0.0–0.1)
Eosinophils Absolute: 0.1 10*3/uL (ref 0.0–0.7)
Eosinophils Relative: 1 % (ref 0–5)
HCT: 38.5 % (ref 36.0–46.0)
Hemoglobin: 12.8 g/dL (ref 12.0–15.0)
LYMPHS ABS: 4.4 10*3/uL — AB (ref 0.7–4.0)
Lymphocytes Relative: 30 % (ref 12–46)
MCH: 30.3 pg (ref 26.0–34.0)
MCHC: 33.2 g/dL (ref 30.0–36.0)
MCV: 91.2 fL (ref 78.0–100.0)
MONOS PCT: 5 % (ref 3–12)
Monocytes Absolute: 0.8 10*3/uL (ref 0.1–1.0)
NEUTROS ABS: 9.4 10*3/uL — AB (ref 1.7–7.7)
Neutrophils Relative %: 64 % (ref 43–77)
PLATELETS: 386 10*3/uL (ref 150–400)
RBC: 4.22 MIL/uL (ref 3.87–5.11)
RDW: 13.8 % (ref 11.5–15.5)
WBC: 14.8 10*3/uL — ABNORMAL HIGH (ref 4.0–10.5)

## 2013-08-21 MED ORDER — ACETAMINOPHEN 325 MG PO TABS
ORAL_TABLET | ORAL | Status: AC
  Administered 2013-08-21: 650 mg
  Filled 2013-08-21: qty 2

## 2013-08-21 MED ORDER — SODIUM CHLORIDE 0.9 % IV SOLN
6.0000 mg/kg | INTRAVENOUS | Status: DC
  Administered 2013-08-21: 500 mg via INTRAVENOUS
  Filled 2013-08-21: qty 50

## 2013-08-21 MED ORDER — DIPHENHYDRAMINE HCL 25 MG PO CAPS
ORAL_CAPSULE | ORAL | Status: AC
  Filled 2013-08-21: qty 1

## 2013-08-21 MED ORDER — DIPHENHYDRAMINE HCL 25 MG PO CAPS
ORAL_CAPSULE | ORAL | Status: AC
  Administered 2013-08-21: 25 mg
  Filled 2013-08-21: qty 1

## 2013-08-21 MED ORDER — ACETAMINOPHEN 325 MG PO TABS: 650.0000 mg | ORAL_TABLET | ORAL | Status: DC

## 2013-08-21 MED ORDER — DIPHENHYDRAMINE HCL 25 MG PO CAPS: 25.0000 mg | ORAL_CAPSULE | ORAL | Status: DC

## 2013-08-21 MED ORDER — SODIUM CHLORIDE 0.9 % IV SOLN: INTRAVENOUS | Status: DC

## 2013-10-01 ENCOUNTER — Other Ambulatory Visit (HOSPITAL_COMMUNITY): Payer: Self-pay | Admitting: *Deleted

## 2013-10-02 ENCOUNTER — Encounter (HOSPITAL_COMMUNITY)
Admission: RE | Admit: 2013-10-02 | Discharge: 2013-10-02 | Disposition: A | Discharge status: Home / Self Care | Payer: 59 | Source: Ambulatory Visit | Attending: Rheumatology | Admitting: Rheumatology

## 2013-10-02 DIAGNOSIS — M069 Rheumatoid arthritis, unspecified: Secondary | ICD-10-CM | POA: Insufficient documentation

## 2013-10-02 LAB — DIFFERENTIAL
BASOS PCT: 0 % (ref 0–1)
Basophils Absolute: 0 10*3/uL (ref 0.0–0.1)
Eosinophils Absolute: 0.4 10*3/uL (ref 0.0–0.7)
Eosinophils Relative: 5 % (ref 0–5)
Lymphocytes Relative: 24 % (ref 12–46)
Lymphs Abs: 2.3 10*3/uL (ref 0.7–4.0)
Monocytes Absolute: 0.6 10*3/uL (ref 0.1–1.0)
Monocytes Relative: 6 % (ref 3–12)
NEUTROS PCT: 65 % (ref 43–77)
Neutro Abs: 6.2 10*3/uL (ref 1.7–7.7)

## 2013-10-02 LAB — CBC
HEMATOCRIT: 38.9 % (ref 36.0–46.0)
Hemoglobin: 13.5 g/dL (ref 12.0–15.0)
MCH: 31.6 pg (ref 26.0–34.0)
MCHC: 34.7 g/dL (ref 30.0–36.0)
MCV: 91.1 fL (ref 78.0–100.0)
PLATELETS: 407 10*3/uL — AB (ref 150–400)
RBC: 4.27 MIL/uL (ref 3.87–5.11)
RDW: 13.6 % (ref 11.5–15.5)
WBC: 9.6 10*3/uL (ref 4.0–10.5)

## 2013-10-02 LAB — COMPREHENSIVE METABOLIC PANEL
ALT: 15 U/L (ref 0–35)
AST: 18 U/L (ref 0–37)
Albumin: 3.7 g/dL (ref 3.5–5.2)
Alkaline Phosphatase: 47 U/L (ref 39–117)
BILIRUBIN TOTAL: 0.3 mg/dL (ref 0.3–1.2)
BUN: 25 mg/dL — AB (ref 6–23)
CHLORIDE: 98 meq/L (ref 96–112)
CO2: 27 mEq/L (ref 19–32)
CREATININE: 1.08 mg/dL (ref 0.50–1.10)
Calcium: 10 mg/dL (ref 8.4–10.5)
GFR calc Af Amer: 67 mL/min — ABNORMAL LOW (ref >90)
GFR calc non Af Amer: 58 mL/min — ABNORMAL LOW (ref >90)
Glucose, Bld: 125 mg/dL — ABNORMAL HIGH (ref 70–99)
Potassium: 4.2 mEq/L (ref 3.7–5.3)
Sodium: 139 mEq/L (ref 137–147)
TOTAL PROTEIN: 8 g/dL (ref 6.0–8.3)

## 2013-10-02 MED ORDER — SODIUM CHLORIDE 0.9 % IV SOLN
500.0000 mg | INTRAVENOUS | Status: DC
  Administered 2013-10-02: 500 mg via INTRAVENOUS
  Filled 2013-10-02: qty 50

## 2013-10-02 MED ORDER — SODIUM CHLORIDE 0.9 % IV SOLN
INTRAVENOUS | Status: DC
  Administered 2013-10-02: 09:00:00 via INTRAVENOUS

## 2013-10-02 MED ORDER — ACETAMINOPHEN 325 MG PO TABS
ORAL_TABLET | ORAL | Status: AC
  Filled 2013-10-02: qty 2

## 2013-10-02 MED ORDER — ACETAMINOPHEN 325 MG PO TABS
650.0000 mg | ORAL_TABLET | ORAL | Status: DC
  Administered 2013-10-02: 650 mg via ORAL

## 2013-10-02 MED ORDER — DIPHENHYDRAMINE HCL 25 MG PO CAPS
ORAL_CAPSULE | ORAL | Status: AC
  Filled 2013-10-02: qty 1

## 2013-10-02 MED ORDER — DIPHENHYDRAMINE HCL 25 MG PO TABS
25.0000 mg | ORAL_TABLET | ORAL | Status: DC
  Administered 2013-10-02: 25 mg via ORAL
  Filled 2013-10-02: qty 1

## 2013-11-13 ENCOUNTER — Encounter (HOSPITAL_COMMUNITY)
Admission: RE | Admit: 2013-11-13 | Discharge: 2013-11-13 | Disposition: A | Discharge status: Home / Self Care | Payer: 59 | Source: Ambulatory Visit | Attending: Rheumatology | Admitting: Rheumatology

## 2013-11-13 DIAGNOSIS — M069 Rheumatoid arthritis, unspecified: Secondary | ICD-10-CM | POA: Diagnosis present

## 2013-11-13 LAB — COMPREHENSIVE METABOLIC PANEL
ALK PHOS: 33 U/L — AB (ref 39–117)
ALT: 18 U/L (ref 0–35)
ANION GAP: 16 — AB (ref 5–15)
AST: 37 U/L (ref 0–37)
Albumin: 3.7 g/dL (ref 3.5–5.2)
BUN: 20 mg/dL (ref 6–23)
CO2: 27 meq/L (ref 19–32)
Calcium: 9.4 mg/dL (ref 8.4–10.5)
Chloride: 96 mEq/L (ref 96–112)
Creatinine, Ser: 1.04 mg/dL (ref 0.50–1.10)
GFR, EST AFRICAN AMERICAN: 70 mL/min — AB (ref >90)
GFR, EST NON AFRICAN AMERICAN: 61 mL/min — AB (ref >90)
GLUCOSE: 215 mg/dL — AB (ref 70–99)
POTASSIUM: 5.3 meq/L (ref 3.7–5.3)
SODIUM: 139 meq/L (ref 137–147)
Total Bilirubin: 0.2 mg/dL — ABNORMAL LOW (ref 0.3–1.2)
Total Protein: 8.2 g/dL (ref 6.0–8.3)

## 2013-11-13 LAB — CBC
HEMATOCRIT: 40.5 % (ref 36.0–46.0)
Hemoglobin: 13.5 g/dL (ref 12.0–15.0)
MCH: 31 pg (ref 26.0–34.0)
MCHC: 33.3 g/dL (ref 30.0–36.0)
MCV: 92.9 fL (ref 78.0–100.0)
Platelets: 397 10*3/uL (ref 150–400)
RBC: 4.36 MIL/uL (ref 3.87–5.11)
RDW: 14.4 % (ref 11.5–15.5)
WBC: 9.9 10*3/uL (ref 4.0–10.5)

## 2013-11-13 LAB — DIFFERENTIAL
BASOS ABS: 0 10*3/uL (ref 0.0–0.1)
BASOS PCT: 0 % (ref 0–1)
EOS PCT: 2 % (ref 0–5)
Eosinophils Absolute: 0.2 10*3/uL (ref 0.0–0.7)
Lymphocytes Relative: 21 % (ref 12–46)
Lymphs Abs: 2 10*3/uL (ref 0.7–4.0)
Monocytes Absolute: 0.4 10*3/uL (ref 0.1–1.0)
Monocytes Relative: 4 % (ref 3–12)
Neutro Abs: 7.3 10*3/uL (ref 1.7–7.7)
Neutrophils Relative %: 73 % (ref 43–77)

## 2013-11-13 MED ORDER — ACETAMINOPHEN 325 MG PO TABS
650.0000 mg | ORAL_TABLET | ORAL | Status: DC
  Administered 2013-11-13: 650 mg via ORAL

## 2013-11-13 MED ORDER — SODIUM CHLORIDE 0.9 % IV SOLN
500.0000 mg | INTRAVENOUS | Status: DC
  Administered 2013-11-13: 500 mg via INTRAVENOUS
  Filled 2013-11-13: qty 50

## 2013-11-13 MED ORDER — SODIUM CHLORIDE 0.9 % IV SOLN
INTRAVENOUS | Status: DC
  Administered 2013-11-13: 09:00:00 via INTRAVENOUS

## 2013-11-13 MED ORDER — DIPHENHYDRAMINE HCL 25 MG PO CAPS
ORAL_CAPSULE | ORAL | Status: AC
  Filled 2013-11-13: qty 1

## 2013-11-13 MED ORDER — ACETAMINOPHEN 325 MG PO TABS
ORAL_TABLET | ORAL | Status: AC
  Filled 2013-11-13: qty 2

## 2013-11-13 MED ORDER — DIPHENHYDRAMINE HCL 25 MG PO TABS
25.0000 mg | ORAL_TABLET | ORAL | Status: DC
  Administered 2013-11-13: 25 mg via ORAL
  Filled 2013-11-13: qty 1

## 2013-12-25 ENCOUNTER — Inpatient Hospital Stay (HOSPITAL_COMMUNITY): Admission: RE | Admit: 2013-12-25 | Payer: 59 | Source: Ambulatory Visit

## 2014-01-14 ENCOUNTER — Other Ambulatory Visit (HOSPITAL_COMMUNITY): Payer: Self-pay | Admitting: *Deleted

## 2014-01-15 ENCOUNTER — Encounter (HOSPITAL_COMMUNITY)
Admission: RE | Admit: 2014-01-15 | Discharge: 2014-01-15 | Disposition: A | Discharge status: Home / Self Care | Payer: 59 | Source: Ambulatory Visit | Attending: Rheumatology | Admitting: Rheumatology

## 2014-01-15 DIAGNOSIS — M069 Rheumatoid arthritis, unspecified: Secondary | ICD-10-CM | POA: Diagnosis present

## 2014-01-15 LAB — CBC
HCT: 38.6 % (ref 36.0–46.0)
HEMOGLOBIN: 12.9 g/dL (ref 12.0–15.0)
MCH: 30.7 pg (ref 26.0–34.0)
MCHC: 33.4 g/dL (ref 30.0–36.0)
MCV: 91.9 fL (ref 78.0–100.0)
Platelets: 410 10*3/uL — ABNORMAL HIGH (ref 150–400)
RBC: 4.2 MIL/uL (ref 3.87–5.11)
RDW: 13.2 % (ref 11.5–15.5)
WBC: 9 10*3/uL (ref 4.0–10.5)

## 2014-01-15 LAB — COMPREHENSIVE METABOLIC PANEL
ALK PHOS: 39 U/L (ref 39–117)
ALT: 12 U/L (ref 0–35)
AST: 14 U/L (ref 0–37)
Albumin: 3.4 g/dL — ABNORMAL LOW (ref 3.5–5.2)
Anion gap: 14 (ref 5–15)
BUN: 21 mg/dL (ref 6–23)
CALCIUM: 9.3 mg/dL (ref 8.4–10.5)
CO2: 27 meq/L (ref 19–32)
Chloride: 98 mEq/L (ref 96–112)
Creatinine, Ser: 1.17 mg/dL — ABNORMAL HIGH (ref 0.50–1.10)
GFR calc Af Amer: 61 mL/min — ABNORMAL LOW (ref >90)
GFR, EST NON AFRICAN AMERICAN: 53 mL/min — AB (ref >90)
Glucose, Bld: 152 mg/dL — ABNORMAL HIGH (ref 70–99)
POTASSIUM: 3.4 meq/L — AB (ref 3.7–5.3)
SODIUM: 139 meq/L (ref 137–147)
Total Bilirubin: <0.2 mg/dL — ABNORMAL LOW (ref 0.3–1.2)
Total Protein: 7.2 g/dL (ref 6.0–8.3)

## 2014-01-15 MED ORDER — DIPHENHYDRAMINE HCL 25 MG PO CAPS
25.0000 mg | ORAL_CAPSULE | ORAL | Status: DC
  Administered 2014-01-15: 25 mg via ORAL

## 2014-01-15 MED ORDER — SODIUM CHLORIDE 0.9 % IV SOLN
6.0000 mg/kg | INTRAVENOUS | Status: DC
  Administered 2014-01-15: 500 mg via INTRAVENOUS
  Filled 2014-01-15: qty 50

## 2014-01-15 MED ORDER — SODIUM CHLORIDE 0.9 % IV SOLN
INTRAVENOUS | Status: DC
  Administered 2014-01-15: 250 mL via INTRAVENOUS

## 2014-01-15 MED ORDER — ACETAMINOPHEN 325 MG PO TABS
650.0000 mg | ORAL_TABLET | ORAL | Status: DC
  Administered 2014-01-15: 650 mg via ORAL

## 2014-01-15 MED ORDER — DIPHENHYDRAMINE HCL 25 MG PO CAPS
ORAL_CAPSULE | ORAL | Status: AC
  Administered 2014-01-15: 25 mg via ORAL
  Filled 2014-01-15: qty 1

## 2014-01-15 MED ORDER — ACETAMINOPHEN 325 MG PO TABS
ORAL_TABLET | ORAL | Status: AC
  Administered 2014-01-15: 650 mg via ORAL
  Filled 2014-01-15: qty 2

## 2014-02-25 ENCOUNTER — Other Ambulatory Visit (HOSPITAL_COMMUNITY): Payer: Self-pay | Admitting: *Deleted

## 2014-02-26 ENCOUNTER — Encounter (HOSPITAL_COMMUNITY)
Admission: RE | Admit: 2014-02-26 | Discharge: 2014-02-26 | Disposition: A | Discharge status: Home / Self Care | Payer: 59 | Source: Ambulatory Visit | Attending: Rheumatology | Admitting: Rheumatology

## 2014-02-26 DIAGNOSIS — M069 Rheumatoid arthritis, unspecified: Secondary | ICD-10-CM | POA: Diagnosis present

## 2014-02-26 LAB — COMPREHENSIVE METABOLIC PANEL
ALT: 14 U/L (ref 0–35)
AST: 14 U/L (ref 0–37)
Albumin: 3.6 g/dL (ref 3.5–5.2)
Alkaline Phosphatase: 51 U/L (ref 39–117)
Anion gap: 15 (ref 5–15)
BUN: 15 mg/dL (ref 6–23)
CALCIUM: 9.9 mg/dL (ref 8.4–10.5)
CO2: 25 meq/L (ref 19–32)
CREATININE: 0.99 mg/dL (ref 0.50–1.10)
Chloride: 98 mEq/L (ref 96–112)
GFR calc Af Amer: 75 mL/min — ABNORMAL LOW (ref >90)
GFR, EST NON AFRICAN AMERICAN: 64 mL/min — AB (ref >90)
Glucose, Bld: 131 mg/dL — ABNORMAL HIGH (ref 70–99)
Potassium: 3.8 mEq/L (ref 3.7–5.3)
Sodium: 138 mEq/L (ref 137–147)
TOTAL PROTEIN: 7.8 g/dL (ref 6.0–8.3)
Total Bilirubin: 0.3 mg/dL (ref 0.3–1.2)

## 2014-02-26 LAB — CBC WITH DIFFERENTIAL/PLATELET
BASOS ABS: 0 10*3/uL (ref 0.0–0.1)
BASOS PCT: 0 % (ref 0–1)
EOS ABS: 0.1 10*3/uL (ref 0.0–0.7)
EOS PCT: 1 % (ref 0–5)
HCT: 40.4 % (ref 36.0–46.0)
Hemoglobin: 13.5 g/dL (ref 12.0–15.0)
Lymphocytes Relative: 18 % (ref 12–46)
Lymphs Abs: 1.6 10*3/uL (ref 0.7–4.0)
MCH: 30.8 pg (ref 26.0–34.0)
MCHC: 33.4 g/dL (ref 30.0–36.0)
MCV: 92 fL (ref 78.0–100.0)
MONO ABS: 0.4 10*3/uL (ref 0.1–1.0)
Monocytes Relative: 5 % (ref 3–12)
Neutro Abs: 6.8 10*3/uL (ref 1.7–7.7)
Neutrophils Relative %: 76 % (ref 43–77)
Platelets: 407 10*3/uL — ABNORMAL HIGH (ref 150–400)
RBC: 4.39 MIL/uL (ref 3.87–5.11)
RDW: 13.2 % (ref 11.5–15.5)
WBC: 8.9 10*3/uL (ref 4.0–10.5)

## 2014-02-26 MED ORDER — ACETAMINOPHEN 325 MG PO TABS
ORAL_TABLET | ORAL | Status: AC
  Filled 2014-02-26: qty 2

## 2014-02-26 MED ORDER — SODIUM CHLORIDE 0.9 % IV SOLN
6.0000 mg/kg | INTRAVENOUS | Status: DC
  Administered 2014-02-26: 500 mg via INTRAVENOUS
  Filled 2014-02-26: qty 50

## 2014-02-26 MED ORDER — DIPHENHYDRAMINE HCL 25 MG PO CAPS
25.0000 mg | ORAL_CAPSULE | ORAL | Status: DC
  Administered 2014-02-26: 25 mg via ORAL

## 2014-02-26 MED ORDER — SODIUM CHLORIDE 0.9 % IV SOLN
INTRAVENOUS | Status: DC
  Administered 2014-02-26: 09:00:00 via INTRAVENOUS

## 2014-02-26 MED ORDER — DIPHENHYDRAMINE HCL 25 MG PO CAPS
ORAL_CAPSULE | ORAL | Status: AC
  Filled 2014-02-26: qty 1

## 2014-02-26 MED ORDER — ACETAMINOPHEN 325 MG PO TABS
650.0000 mg | ORAL_TABLET | ORAL | Status: DC
  Administered 2014-02-26: 650 mg via ORAL

## 2014-04-09 ENCOUNTER — Encounter (HOSPITAL_COMMUNITY)
Admission: RE | Admit: 2014-04-09 | Discharge: 2014-04-09 | Disposition: A | Discharge status: Home / Self Care | Payer: 59 | Source: Ambulatory Visit | Attending: Rheumatology | Admitting: Rheumatology

## 2014-04-09 DIAGNOSIS — M069 Rheumatoid arthritis, unspecified: Secondary | ICD-10-CM | POA: Insufficient documentation

## 2014-04-09 LAB — CBC WITH DIFFERENTIAL/PLATELET
Basophils Absolute: 0.1 10*3/uL (ref 0.0–0.1)
Basophils Relative: 2 % — ABNORMAL HIGH (ref 0–1)
EOS PCT: 6 % — AB (ref 0–5)
Eosinophils Absolute: 0.3 10*3/uL (ref 0.0–0.7)
HCT: 41.3 % (ref 36.0–46.0)
Hemoglobin: 13.5 g/dL (ref 12.0–15.0)
LYMPHS ABS: 1.6 10*3/uL (ref 0.7–4.0)
LYMPHS PCT: 32 % (ref 12–46)
MCH: 29.3 pg (ref 26.0–34.0)
MCHC: 32.7 g/dL (ref 30.0–36.0)
MCV: 89.8 fL (ref 78.0–100.0)
Monocytes Absolute: 0.4 10*3/uL (ref 0.1–1.0)
Monocytes Relative: 7 % (ref 3–12)
Neutro Abs: 2.8 10*3/uL (ref 1.7–7.7)
Neutrophils Relative %: 53 % (ref 43–77)
Platelets: 278 10*3/uL (ref 150–400)
RBC: 4.6 MIL/uL (ref 3.87–5.11)
RDW: 13.4 % (ref 11.5–15.5)
WBC: 5.2 10*3/uL (ref 4.0–10.5)

## 2014-04-09 LAB — COMPREHENSIVE METABOLIC PANEL
ALT: 17 U/L (ref 0–35)
AST: 22 U/L (ref 0–37)
Albumin: 4 g/dL (ref 3.5–5.2)
Alkaline Phosphatase: 42 U/L (ref 39–117)
Anion gap: 14 (ref 5–15)
BILIRUBIN TOTAL: 0.4 mg/dL (ref 0.3–1.2)
BUN: 21 mg/dL (ref 6–23)
CALCIUM: 10.2 mg/dL (ref 8.4–10.5)
CO2: 26 meq/L (ref 19–32)
Chloride: 97 mEq/L (ref 96–112)
Creatinine, Ser: 0.92 mg/dL (ref 0.50–1.10)
GFR, EST AFRICAN AMERICAN: 82 mL/min — AB (ref >90)
GFR, EST NON AFRICAN AMERICAN: 70 mL/min — AB (ref >90)
GLUCOSE: 161 mg/dL — AB (ref 70–99)
POTASSIUM: 4.3 meq/L (ref 3.7–5.3)
Sodium: 137 mEq/L (ref 137–147)
Total Protein: 8 g/dL (ref 6.0–8.3)

## 2014-04-09 MED ORDER — ACETAMINOPHEN 325 MG PO TABS
650.0000 mg | ORAL_TABLET | ORAL | Status: AC
  Administered 2014-04-09: 650 mg via ORAL

## 2014-04-09 MED ORDER — SODIUM CHLORIDE 0.9 % IV SOLN
6.0000 mg/kg | INTRAVENOUS | Status: AC
  Administered 2014-04-09: 500 mg via INTRAVENOUS
  Filled 2014-04-09: qty 50

## 2014-04-09 MED ORDER — DIPHENHYDRAMINE HCL 25 MG PO CAPS
ORAL_CAPSULE | ORAL | Status: AC
  Filled 2014-04-09: qty 1

## 2014-04-09 MED ORDER — ACETAMINOPHEN 325 MG PO TABS
ORAL_TABLET | ORAL | Status: AC
  Filled 2014-04-09: qty 2

## 2014-04-09 MED ORDER — DIPHENHYDRAMINE HCL 25 MG PO CAPS
25.0000 mg | ORAL_CAPSULE | ORAL | Status: AC
  Administered 2014-04-09: 25 mg via ORAL

## 2014-04-09 MED ORDER — SODIUM CHLORIDE 0.9 % IV SOLN
INTRAVENOUS | Status: AC
  Administered 2014-04-09: 250 mL via INTRAVENOUS

## 2014-04-10 ENCOUNTER — Other Ambulatory Visit: Payer: Self-pay

## 2014-04-10 DIAGNOSIS — Z1231 Encounter for screening mammogram for malignant neoplasm of breast: Secondary | ICD-10-CM

## 2014-04-13 ENCOUNTER — Other Ambulatory Visit: Payer: Self-pay | Admitting: Internal Medicine

## 2014-04-13 ENCOUNTER — Encounter: Payer: Self-pay | Admitting: Internal Medicine

## 2014-04-13 DIAGNOSIS — E2839 Other primary ovarian failure: Secondary | ICD-10-CM

## 2014-05-04 ENCOUNTER — Ambulatory Visit
Admission: RE | Admit: 2014-05-04 | Discharge: 2014-05-04 | Disposition: A | Discharge status: Home / Self Care | Payer: Medicaid Other | Source: Ambulatory Visit

## 2014-05-04 DIAGNOSIS — Z1231 Encounter for screening mammogram for malignant neoplasm of breast: Secondary | ICD-10-CM

## 2014-05-07 ENCOUNTER — Ambulatory Visit
Admission: RE | Admit: 2014-05-07 | Discharge: 2014-05-07 | Disposition: A | Discharge status: Home / Self Care | Payer: Medicaid Other | Source: Ambulatory Visit | Attending: Internal Medicine | Admitting: Internal Medicine

## 2014-05-07 DIAGNOSIS — E2839 Other primary ovarian failure: Secondary | ICD-10-CM

## 2014-05-21 ENCOUNTER — Encounter (HOSPITAL_COMMUNITY)
Admission: RE | Admit: 2014-05-21 | Discharge: 2014-05-21 | Disposition: A | Discharge status: Home / Self Care | Payer: Medicaid Other | Source: Ambulatory Visit | Attending: Rheumatology | Admitting: Rheumatology

## 2014-05-21 DIAGNOSIS — M069 Rheumatoid arthritis, unspecified: Secondary | ICD-10-CM | POA: Insufficient documentation

## 2014-05-21 LAB — COMPREHENSIVE METABOLIC PANEL
ALK PHOS: 40 U/L (ref 39–117)
ALT: 17 U/L (ref 0–35)
AST: 23 U/L (ref 0–37)
Albumin: 3.6 g/dL (ref 3.5–5.2)
Anion gap: 6 (ref 5–15)
BILIRUBIN TOTAL: 0.5 mg/dL (ref 0.3–1.2)
BUN: 14 mg/dL (ref 6–23)
CALCIUM: 9.2 mg/dL (ref 8.4–10.5)
CO2: 28 mmol/L (ref 19–32)
Chloride: 100 mEq/L (ref 96–112)
Creatinine, Ser: 1.15 mg/dL — ABNORMAL HIGH (ref 0.50–1.10)
GFR calc Af Amer: 62 mL/min — ABNORMAL LOW (ref >90)
GFR, EST NON AFRICAN AMERICAN: 54 mL/min — AB (ref >90)
Glucose, Bld: 219 mg/dL — ABNORMAL HIGH (ref 70–99)
Potassium: 3.7 mmol/L (ref 3.5–5.1)
SODIUM: 134 mmol/L — AB (ref 135–145)
Total Protein: 7.4 g/dL (ref 6.0–8.3)

## 2014-05-21 LAB — CBC WITH DIFFERENTIAL/PLATELET
BASOS PCT: 0 % (ref 0–1)
Basophils Absolute: 0 10*3/uL (ref 0.0–0.1)
Eosinophils Absolute: 0.1 10*3/uL (ref 0.0–0.7)
Eosinophils Relative: 1 % (ref 0–5)
HCT: 39.8 % (ref 36.0–46.0)
HEMOGLOBIN: 13.4 g/dL (ref 12.0–15.0)
LYMPHS PCT: 15 % (ref 12–46)
Lymphs Abs: 1.4 10*3/uL (ref 0.7–4.0)
MCH: 30.3 pg (ref 26.0–34.0)
MCHC: 33.7 g/dL (ref 30.0–36.0)
MCV: 90 fL (ref 78.0–100.0)
Monocytes Absolute: 0.5 10*3/uL (ref 0.1–1.0)
Monocytes Relative: 5 % (ref 3–12)
Neutro Abs: 7.6 10*3/uL (ref 1.7–7.7)
Neutrophils Relative %: 79 % — ABNORMAL HIGH (ref 43–77)
PLATELETS: 372 10*3/uL (ref 150–400)
RBC: 4.42 MIL/uL (ref 3.87–5.11)
RDW: 13.9 % (ref 11.5–15.5)
WBC: 9.5 10*3/uL (ref 4.0–10.5)

## 2014-05-21 MED ORDER — DIPHENHYDRAMINE HCL 25 MG PO CAPS
ORAL_CAPSULE | ORAL | Status: AC
Start: 2014-05-21 — End: 2014-05-21
  Filled 2014-05-21: qty 1

## 2014-05-21 MED ORDER — SODIUM CHLORIDE 0.9 % IV SOLN
INTRAVENOUS | Status: DC
  Administered 2014-05-21: 09:00:00 via INTRAVENOUS

## 2014-05-21 MED ORDER — ACETAMINOPHEN 325 MG PO TABS
ORAL_TABLET | ORAL | Status: AC
  Filled 2014-05-21: qty 2

## 2014-05-21 MED ORDER — SODIUM CHLORIDE 0.9 % IV SOLN
500.0000 mg | INTRAVENOUS | Status: DC
  Administered 2014-05-21: 500 mg via INTRAVENOUS
  Filled 2014-05-21: qty 50

## 2014-05-21 MED ORDER — ACETAMINOPHEN 325 MG PO TABS
650.0000 mg | ORAL_TABLET | ORAL | Status: DC
  Administered 2014-05-21: 650 mg via ORAL

## 2014-05-21 MED ORDER — DIPHENHYDRAMINE HCL 25 MG PO CAPS
25.0000 mg | ORAL_CAPSULE | ORAL | Status: DC
  Administered 2014-05-21: 25 mg via ORAL

## 2014-05-25 LAB — QUANTIFERON IN TUBE
QFT TB AG MINUS NIL VALUE: 0 [IU]/mL
QUANTIFERON MITOGEN VALUE: 2.77 IU/mL
QUANTIFERON TB AG VALUE: 0.02 IU/mL
QUANTIFERON TB GOLD: NEGATIVE
Quantiferon Nil Value: 0.02 IU/mL

## 2014-05-25 LAB — QUANTIFERON TB GOLD ASSAY (BLOOD)

## 2014-06-15 ENCOUNTER — Ambulatory Visit (AMBULATORY_SURGERY_CENTER): Payer: Medicaid Other | Admitting: *Deleted

## 2014-06-15 VITALS — Ht 64.0 in | Wt 190.0 lb

## 2014-06-15 DIAGNOSIS — Z1211 Encounter for screening for malignant neoplasm of colon: Secondary | ICD-10-CM

## 2014-06-15 MED ORDER — MOVIPREP 100 G PO SOLR: 1.0000 | Freq: Once | ORAL | Status: DC

## 2014-06-15 NOTE — Progress Notes (Signed)
No home 02 use No diet pills No egg or soy allergy No issues with past sedation emmi video to e mail

## 2014-06-29 ENCOUNTER — Ambulatory Visit (AMBULATORY_SURGERY_CENTER): Payer: Medicaid Other | Admitting: Internal Medicine

## 2014-06-29 ENCOUNTER — Encounter: Payer: Self-pay | Admitting: Internal Medicine

## 2014-06-29 VITALS — BP 129/63 | HR 72 | Temp 99.0°F | Resp 15 | Ht 64.0 in | Wt 190.0 lb

## 2014-06-29 DIAGNOSIS — Z1211 Encounter for screening for malignant neoplasm of colon: Secondary | ICD-10-CM

## 2014-06-29 LAB — GLUCOSE, CAPILLARY
Glucose-Capillary: 87 mg/dL (ref 70–99)
Glucose-Capillary: 81 mg/dL (ref 70–99)

## 2014-06-29 MED ORDER — SODIUM CHLORIDE 0.9 % IV SOLN: 500.0000 mL | INTRAVENOUS | Status: DC

## 2014-06-29 NOTE — Patient Instructions (Signed)
YOU HAD AN ENDOSCOPIC PROCEDURE TODAY AT THE  ENDOSCOPY CENTER:   Refer to the procedure report that was given to you for any specific questions about what was found during the examination.  If the procedure report does not answer your questions, please call your gastroenterologist to clarify.  If you requested that your care partner not be given the details of your procedure findings, then the procedure report has been included in a sealed envelope for you to review at your convenience later.  YOU SHOULD EXPECT: Some feelings of bloating in the abdomen. Passage of more gas than usual.  Walking can help get rid of the air that was put into your GI tract during the procedure and reduce the bloating. If you had a lower endoscopy (such as a colonoscopy or flexible sigmoidoscopy) you may notice spotting of blood in your stool or on the toilet paper. If you underwent a bowel prep for your procedure, you may not have a normal bowel movement for a few days.  Please Note:  You might notice some irritation and congestion in your nose or some drainage.  This is from the oxygen used during your procedure.  There is no need for concern and it should clear up in a day or so.  SYMPTOMS TO REPORT IMMEDIATELY:   Following lower endoscopy (colonoscopy or flexible sigmoidoscopy):  Excessive amounts of blood in the stool  Significant tenderness or worsening of abdominal pains  Swelling of the abdomen that is new, acute  Fever of 100F or higher   A gastroenterologist can be reached at any hour by calling [phone].   DIET: Your first meal following the procedure should be a small meal and then it is ok to progress to your normal diet. Heavy or fried foods are harder to digest and may make you feel nauseous or bloated.  Likewise, meals heavy in dairy and vegetables can increase bloating.  Drink plenty of fluids but you should avoid alcoholic beverages for 24 hours.  ACTIVITY:  You should plan to take it  easy for the rest of today and you should NOT DRIVE or use heavy machinery until tomorrow (because of the sedation medicines used during the test).    FOLLOW UP: Our staff will call the number listed on your records the next business day following your procedure to check on you and address any questions or concerns that you may have regarding the information given to you following your procedure. If we do not reach you, we will leave a message.  However, if you are feeling well and you are not experiencing any problems, there is no need to return our call.  We will assume that you have returned to your regular daily activities without incident.  If any biopsies were taken you will be contacted by phone or by letter within the next 1-3 weeks.  Please call us at [phone] if you have not heard about the biopsies in 3 weeks.    SIGNATURES/CONFIDENTIALITY: You and/or your care partner have signed paperwork which will be entered into your electronic medical record.  These signatures attest to the fact that that the information above on your After Visit Summary has been reviewed and is understood.  Full responsibility of the confidentiality of this discharge information lies with you and/or your care-partner. 

## 2014-06-29 NOTE — Progress Notes (Signed)
Report to PACU, RN, vss, BBS= Clear.  

## 2014-06-29 NOTE — Op Note (Signed)
[facility] [address] [name], [address]   COLONOSCOPY PROCEDURE REPORT  PATIENT: Wendy Woods, Wendy Woods  MR#: [id number] BIRTHDATE: [birth year] , 54  yrs. old GENDER: female ENDOSCOPIST: Beverley Fiedler, MD REFERRED XM:IWOEH Concepcion Elk, M.D. PROCEDURE DATE:  06/29/2014 PROCEDURE:   Colonoscopy, screening First Screening Colonoscopy - Avg.  risk and is 50 yrs.  old or older Yes.  Prior Negative Screening - Now for repeat screening. N/A  History of Adenoma - Now for follow-up colonoscopy & has been > or = to 3 yrs.  N/A  Polyps Removed Today? No.  Polyps Removed Today? No.  Recommend repeat exam, <10 yrs? Polyps Removed Today? No.  Recommend repeat exam, <10 yrs? No. ASA CLASS:   Class III INDICATIONS:average risk patient for colon cancer and 1st colonoscopy. MEDICATIONS: Monitored anesthesia care and Propofol 200 mg IV  DESCRIPTION OF PROCEDURE:   After the risks benefits and alternatives of the procedure were thoroughly explained, informed consent was obtained.  The digital rectal exam revealed no abnormalities of the rectum.   The [id number]  endoscope was introduced through the anus and advanced to the cecum, which was identified by both the appendix and ileocecal valve. No adverse events experienced.   The quality of the prep was good, using MoviPrep  The instrument was then slowly withdrawn as the colon was fully examined.   COLON FINDINGS: A normal appearing cecum, ileocecal valve, and appendiceal orifice were identified.  the ascending, transverse, descending, sigmoid colon, and rectum appeared unremarkable. Retroflexed views revealed no abnormalities. The time to cecum=3 minutes 16 seconds.  Withdrawal time=7 minutes 53 seconds.  The scope was withdrawn and the procedure completed.  COMPLICATIONS: There were no complications.  ENDOSCOPIC IMPRESSION: Normal colonoscopy  RECOMMENDATIONS: You should continue to follow colorectal cancer screening  guidelines for "routine risk" patients with a repeat colonoscopy in 10 years. There is no need for FOBT (stool) testing for at least 5 years.  eSigned:  Beverley Fiedler, MD 06/29/2014 11:08 AM   cc: Fleet Contras, MD and The Patient

## 2014-06-30 ENCOUNTER — Telehealth: Payer: Self-pay | Admitting: *Deleted

## 2014-06-30 NOTE — Telephone Encounter (Signed)
  Follow up Call-  Call back number 06/29/2014  Post procedure Call Back phone  # [phone]  Permission to leave phone message Yes     Patient questions:  Do you have a fever, pain , or abdominal swelling? No. Pain Score  0 *  Have you tolerated food without any problems? Yes.    Have you been able to return to your normal activities? Yes.    Do you have any questions about your discharge instructions: Diet   No. Medications  No. Follow up visit  No.  Do you have questions or concerns about your Care? No.  Actions: * If pain score is 4 or above: No action needed, pain <4.

## 2014-07-01 ENCOUNTER — Other Ambulatory Visit (HOSPITAL_COMMUNITY): Payer: Self-pay | Admitting: *Deleted

## 2014-07-02 ENCOUNTER — Encounter (HOSPITAL_COMMUNITY)
Admission: RE | Admit: 2014-07-02 | Discharge: 2014-07-02 | Disposition: A | Discharge status: Home / Self Care | Payer: Medicaid Other | Source: Ambulatory Visit | Attending: Rheumatology | Admitting: Rheumatology

## 2014-07-02 ENCOUNTER — Encounter (HOSPITAL_COMMUNITY): Payer: 59

## 2014-07-02 DIAGNOSIS — M069 Rheumatoid arthritis, unspecified: Secondary | ICD-10-CM | POA: Insufficient documentation

## 2014-07-02 LAB — CBC WITH DIFFERENTIAL/PLATELET
Basophils Absolute: 0.1 10*3/uL (ref 0.0–0.1)
Basophils Relative: 1 % (ref 0–1)
EOS PCT: 4 % (ref 0–5)
Eosinophils Absolute: 0.4 10*3/uL (ref 0.0–0.7)
HCT: 42.3 % (ref 36.0–46.0)
Hemoglobin: 14.1 g/dL (ref 12.0–15.0)
LYMPHS PCT: 35 % (ref 12–46)
Lymphs Abs: 3.2 10*3/uL (ref 0.7–4.0)
MCH: 30.5 pg (ref 26.0–34.0)
MCHC: 33.3 g/dL (ref 30.0–36.0)
MCV: 91.4 fL (ref 78.0–100.0)
Monocytes Absolute: 0.7 10*3/uL (ref 0.1–1.0)
Monocytes Relative: 7 % (ref 3–12)
NEUTROS PCT: 53 % (ref 43–77)
Neutro Abs: 5 10*3/uL (ref 1.7–7.7)
PLATELETS: 410 10*3/uL — AB (ref 150–400)
RBC: 4.63 MIL/uL (ref 3.87–5.11)
RDW: 13.8 % (ref 11.5–15.5)
WBC: 9.3 10*3/uL (ref 4.0–10.5)

## 2014-07-02 LAB — COMPREHENSIVE METABOLIC PANEL
ALT: 17 U/L (ref 0–35)
AST: 21 U/L (ref 0–37)
Albumin: 3.9 g/dL (ref 3.5–5.2)
Alkaline Phosphatase: 40 U/L (ref 39–117)
Anion gap: 6 (ref 5–15)
BUN: 16 mg/dL (ref 6–23)
CALCIUM: 9.3 mg/dL (ref 8.4–10.5)
CO2: 30 mmol/L (ref 19–32)
Chloride: 99 mmol/L (ref 96–112)
Creatinine, Ser: 1.14 mg/dL — ABNORMAL HIGH (ref 0.50–1.10)
GFR, EST AFRICAN AMERICAN: 62 mL/min — AB (ref >90)
GFR, EST NON AFRICAN AMERICAN: 54 mL/min — AB (ref >90)
GLUCOSE: 119 mg/dL — AB (ref 70–99)
Potassium: 3.5 mmol/L (ref 3.5–5.1)
SODIUM: 135 mmol/L (ref 135–145)
Total Bilirubin: 0.6 mg/dL (ref 0.3–1.2)
Total Protein: 7.8 g/dL (ref 6.0–8.3)

## 2014-07-02 MED ORDER — DIPHENHYDRAMINE HCL 25 MG PO CAPS
ORAL_CAPSULE | ORAL | Status: AC
  Filled 2014-07-02: qty 1

## 2014-07-02 MED ORDER — ACETAMINOPHEN 325 MG PO TABS
ORAL_TABLET | ORAL | Status: AC
  Filled 2014-07-02: qty 2

## 2014-07-02 MED ORDER — SODIUM CHLORIDE 0.9 % IV SOLN
INTRAVENOUS | Status: DC
  Administered 2014-07-02: 10:00:00 via INTRAVENOUS

## 2014-07-02 MED ORDER — SODIUM CHLORIDE 0.9 % IV SOLN
6.0000 mg/kg | INTRAVENOUS | Status: DC
  Administered 2014-07-02: 500 mg via INTRAVENOUS
  Filled 2014-07-02: qty 50

## 2014-07-02 MED ORDER — ACETAMINOPHEN 325 MG PO TABS
650.0000 mg | ORAL_TABLET | Freq: Four times a day (QID) | ORAL | Status: DC | PRN
  Administered 2014-07-02: 650 mg via ORAL

## 2014-07-02 MED ORDER — DIPHENHYDRAMINE HCL 25 MG PO TABS: 25.0000 mg | ORAL_TABLET | Freq: Once | ORAL | Status: DC

## 2014-07-02 MED ORDER — DIPHENHYDRAMINE HCL 25 MG PO CAPS
25.0000 mg | ORAL_CAPSULE | Freq: Once | ORAL | Status: AC
  Administered 2014-07-02: 25 mg via ORAL

## 2014-07-15 ENCOUNTER — Observation Stay (HOSPITAL_COMMUNITY)
Admission: EM | Admit: 2014-07-15 | Discharge: 2014-07-16 | Disposition: A | Discharge status: Home / Self Care | Payer: Medicaid Other | Attending: Internal Medicine | Admitting: Internal Medicine

## 2014-07-15 ENCOUNTER — Encounter (HOSPITAL_COMMUNITY): Payer: Self-pay | Admitting: Family Medicine

## 2014-07-15 DIAGNOSIS — K219 Gastro-esophageal reflux disease without esophagitis: Secondary | ICD-10-CM | POA: Insufficient documentation

## 2014-07-15 DIAGNOSIS — Z91040 Latex allergy status: Secondary | ICD-10-CM | POA: Diagnosis not present

## 2014-07-15 DIAGNOSIS — E785 Hyperlipidemia, unspecified: Secondary | ICD-10-CM | POA: Diagnosis not present

## 2014-07-15 DIAGNOSIS — Z9841 Cataract extraction status, right eye: Secondary | ICD-10-CM | POA: Diagnosis not present

## 2014-07-15 DIAGNOSIS — M069 Rheumatoid arthritis, unspecified: Secondary | ICD-10-CM | POA: Diagnosis not present

## 2014-07-15 DIAGNOSIS — I10 Essential (primary) hypertension: Secondary | ICD-10-CM | POA: Diagnosis present

## 2014-07-15 DIAGNOSIS — E119 Type 2 diabetes mellitus without complications: Secondary | ICD-10-CM

## 2014-07-15 DIAGNOSIS — Z9049 Acquired absence of other specified parts of digestive tract: Secondary | ICD-10-CM | POA: Diagnosis not present

## 2014-07-15 DIAGNOSIS — E1101 Type 2 diabetes mellitus with hyperosmolarity with coma: Secondary | ICD-10-CM

## 2014-07-15 DIAGNOSIS — T7840XA Allergy, unspecified, initial encounter: Secondary | ICD-10-CM

## 2014-07-15 DIAGNOSIS — E876 Hypokalemia: Secondary | ICD-10-CM | POA: Diagnosis not present

## 2014-07-15 DIAGNOSIS — Z9842 Cataract extraction status, left eye: Secondary | ICD-10-CM | POA: Diagnosis not present

## 2014-07-15 DIAGNOSIS — Z880 Allergy status to penicillin: Secondary | ICD-10-CM | POA: Diagnosis not present

## 2014-07-15 DIAGNOSIS — Z794 Long term (current) use of insulin: Secondary | ICD-10-CM | POA: Diagnosis not present

## 2014-07-15 DIAGNOSIS — T783XXA Angioneurotic edema, initial encounter: Principal | ICD-10-CM | POA: Diagnosis present

## 2014-07-15 HISTORY — DX: Type 2 diabetes mellitus without complications: E11.9

## 2014-07-15 LAB — BASIC METABOLIC PANEL
Anion gap: 9 (ref 5–15)
BUN: 17 mg/dL (ref 6–23)
CHLORIDE: 98 mmol/L (ref 96–112)
CO2: 30 mmol/L (ref 19–32)
Calcium: 10.1 mg/dL (ref 8.4–10.5)
Creatinine, Ser: 1.11 mg/dL — ABNORMAL HIGH (ref 0.50–1.10)
GFR calc Af Amer: 65 mL/min — ABNORMAL LOW (ref >90)
GFR calc non Af Amer: 56 mL/min — ABNORMAL LOW (ref >90)
GLUCOSE: 109 mg/dL — AB (ref 70–99)
Potassium: 3.1 mmol/L — ABNORMAL LOW (ref 3.5–5.1)
SODIUM: 137 mmol/L (ref 135–145)

## 2014-07-15 LAB — CBC WITH DIFFERENTIAL/PLATELET
BASOS ABS: 0 10*3/uL (ref 0.0–0.1)
Basophils Relative: 0 % (ref 0–1)
EOS PCT: 3 % (ref 0–5)
Eosinophils Absolute: 0.3 10*3/uL (ref 0.0–0.7)
HCT: 39.7 % (ref 36.0–46.0)
Hemoglobin: 13.4 g/dL (ref 12.0–15.0)
Lymphocytes Relative: 30 % (ref 12–46)
Lymphs Abs: 3 10*3/uL (ref 0.7–4.0)
MCH: 30.8 pg (ref 26.0–34.0)
MCHC: 33.8 g/dL (ref 30.0–36.0)
MCV: 91.3 fL (ref 78.0–100.0)
Monocytes Absolute: 0.7 10*3/uL (ref 0.1–1.0)
Monocytes Relative: 7 % (ref 3–12)
Neutro Abs: 6.1 10*3/uL (ref 1.7–7.7)
Neutrophils Relative %: 60 % (ref 43–77)
PLATELETS: 420 10*3/uL — AB (ref 150–400)
RBC: 4.35 MIL/uL (ref 3.87–5.11)
RDW: 13.8 % (ref 11.5–15.5)
WBC: 10.2 10*3/uL (ref 4.0–10.5)

## 2014-07-15 LAB — GLUCOSE, CAPILLARY: GLUCOSE-CAPILLARY: 173 mg/dL — AB (ref 70–99)

## 2014-07-15 LAB — CBG MONITORING, ED
GLUCOSE-CAPILLARY: 121 mg/dL — AB (ref 70–99)
Glucose-Capillary: 127 mg/dL — ABNORMAL HIGH (ref 70–99)
Glucose-Capillary: 126 mg/dL — ABNORMAL HIGH (ref 70–99)

## 2014-07-15 LAB — MRSA PCR SCREENING: MRSA by PCR: NEGATIVE

## 2014-07-15 MED ORDER — POTASSIUM CHLORIDE 20 MEQ/15ML (10%) PO SOLN
40.0000 meq | Freq: Once | ORAL | Status: DC
Start: 2014-07-15 — End: 2014-07-16

## 2014-07-15 MED ORDER — HYDROCHLOROTHIAZIDE 25 MG PO TABS: 25.0000 mg | ORAL_TABLET | Freq: Every day | ORAL | Status: DC

## 2014-07-15 MED ORDER — POTASSIUM CHLORIDE IN NACL 20-0.9 MEQ/L-% IV SOLN
INTRAVENOUS | Status: AC
  Administered 2014-07-15: 75 mL/h via INTRAVENOUS
  Filled 2014-07-15: qty 1000

## 2014-07-15 MED ORDER — INSULIN ASPART 100 UNIT/ML ~~LOC~~ SOLN
0.0000 [IU] | Freq: Three times a day (TID) | SUBCUTANEOUS | Status: DC
  Administered 2014-07-15 – 2014-07-16 (×2): 2 [IU] via SUBCUTANEOUS
  Filled 2014-07-15: qty 1

## 2014-07-15 MED ORDER — SODIUM CHLORIDE 0.9 % IJ SOLN
3.0000 mL | Freq: Two times a day (BID) | INTRAMUSCULAR | Status: DC
  Administered 2014-07-15: 3 mL via INTRAVENOUS

## 2014-07-15 MED ORDER — INSULIN ASPART 100 UNIT/ML ~~LOC~~ SOLN
0.0000 [IU] | Freq: Every day | SUBCUTANEOUS | Status: DC
Start: 2014-07-15 — End: 2014-07-16

## 2014-07-15 MED ORDER — ACETAMINOPHEN 650 MG RE SUPP: 650.0000 mg | Freq: Four times a day (QID) | RECTAL | Status: DC | PRN

## 2014-07-15 MED ORDER — OXYCODONE HCL 5 MG PO TABS: 5.0000 mg | ORAL_TABLET | ORAL | Status: DC | PRN

## 2014-07-15 MED ORDER — DIPHENHYDRAMINE HCL 25 MG PO TABS: 25.0000 mg | ORAL_TABLET | Freq: Four times a day (QID) | ORAL | Status: DC

## 2014-07-15 MED ORDER — AMLODIPINE BESYLATE 5 MG PO TABS
5.0000 mg | ORAL_TABLET | Freq: Every day | ORAL | Status: DC
  Administered 2014-07-15 – 2014-07-16 (×2): 5 mg via ORAL
  Filled 2014-07-15 (×2): qty 1

## 2014-07-15 MED ORDER — FAMOTIDINE IN NACL 20-0.9 MG/50ML-% IV SOLN
20.0000 mg | Freq: Three times a day (TID) | INTRAVENOUS | Status: DC
  Administered 2014-07-15 – 2014-07-16 (×3): 20 mg via INTRAVENOUS
  Filled 2014-07-15 (×6): qty 50

## 2014-07-15 MED ORDER — PREDNISONE 10 MG PO TABS: 40.0000 mg | ORAL_TABLET | Freq: Every day | ORAL | Status: DC

## 2014-07-15 MED ORDER — DIPHENHYDRAMINE HCL 50 MG/ML IJ SOLN
25.0000 mg | Freq: Once | INTRAMUSCULAR | Status: AC
  Administered 2014-07-15: 25 mg via INTRAVENOUS
  Filled 2014-07-15: qty 1

## 2014-07-15 MED ORDER — ALUM & MAG HYDROXIDE-SIMETH 200-200-20 MG/5ML PO SUSP: 30.0000 mL | Freq: Four times a day (QID) | ORAL | Status: DC | PRN

## 2014-07-15 MED ORDER — FAMOTIDINE 20 MG PO TABS: 20.0000 mg | ORAL_TABLET | Freq: Two times a day (BID) | ORAL | Status: DC

## 2014-07-15 MED ORDER — INSULIN GLARGINE 100 UNIT/ML ~~LOC~~ SOLN
10.0000 [IU] | Freq: Every day | SUBCUTANEOUS | Status: DC
  Filled 2014-07-15 (×2): qty 0.1

## 2014-07-15 MED ORDER — ONDANSETRON HCL 4 MG/2ML IJ SOLN: 4.0000 mg | Freq: Four times a day (QID) | INTRAMUSCULAR | Status: DC | PRN

## 2014-07-15 MED ORDER — ONDANSETRON HCL 4 MG PO TABS: 4.0000 mg | ORAL_TABLET | Freq: Four times a day (QID) | ORAL | Status: DC | PRN

## 2014-07-15 MED ORDER — SODIUM CHLORIDE 0.9 % IV SOLN: 250.0000 mL | INTRAVENOUS | Status: DC | PRN

## 2014-07-15 MED ORDER — HEPARIN SODIUM (PORCINE) 5000 UNIT/ML IJ SOLN
5000.0000 [IU] | Freq: Three times a day (TID) | INTRAMUSCULAR | Status: DC
  Administered 2014-07-15 – 2014-07-16 (×2): 5000 [IU] via SUBCUTANEOUS
  Filled 2014-07-15 (×6): qty 1

## 2014-07-15 MED ORDER — CANAGLIFLOZIN 100 MG PO TABS
100.0000 mg | ORAL_TABLET | Freq: Every day | ORAL | Status: DC
  Administered 2014-07-15 – 2014-07-16 (×2): 100 mg via ORAL
  Filled 2014-07-15 (×3): qty 1

## 2014-07-15 MED ORDER — METHYLPREDNISOLONE SODIUM SUCC 125 MG IJ SOLR
60.0000 mg | Freq: Three times a day (TID) | INTRAMUSCULAR | Status: DC
  Administered 2014-07-15 – 2014-07-16 (×3): 60 mg via INTRAVENOUS
  Filled 2014-07-15 (×2): qty 2
  Filled 2014-07-15 (×3): qty 0.96
  Filled 2014-07-15: qty 2

## 2014-07-15 MED ORDER — METHYLPREDNISOLONE SODIUM SUCC 125 MG IJ SOLR
125.0000 mg | Freq: Once | INTRAMUSCULAR | Status: AC
  Administered 2014-07-15: 125 mg via INTRAVENOUS
  Filled 2014-07-15: qty 2

## 2014-07-15 MED ORDER — FAMOTIDINE IN NACL 20-0.9 MG/50ML-% IV SOLN
20.0000 mg | Freq: Once | INTRAVENOUS | Status: AC
  Administered 2014-07-15: 20 mg via INTRAVENOUS
  Filled 2014-07-15: qty 50

## 2014-07-15 MED ORDER — DIPHENHYDRAMINE HCL 50 MG/ML IJ SOLN
25.0000 mg | Freq: Three times a day (TID) | INTRAMUSCULAR | Status: DC
  Administered 2014-07-15 – 2014-07-16 (×3): 25 mg via INTRAVENOUS
  Filled 2014-07-15: qty 0.5
  Filled 2014-07-15 (×3): qty 1
  Filled 2014-07-15 (×2): qty 0.5

## 2014-07-15 MED ORDER — EPINEPHRINE 0.3 MG/0.3ML IJ SOAJ
0.3000 mg | INTRAMUSCULAR | Status: DC | PRN
  Filled 2014-07-15: qty 0.6

## 2014-07-15 MED ORDER — MORPHINE SULFATE 2 MG/ML IJ SOLN: 1.0000 mg | INTRAMUSCULAR | Status: DC | PRN

## 2014-07-15 MED ORDER — SODIUM CHLORIDE 0.9 % IJ SOLN: 3.0000 mL | INTRAMUSCULAR | Status: DC | PRN

## 2014-07-15 MED ORDER — ACETAMINOPHEN 325 MG PO TABS: 650.0000 mg | ORAL_TABLET | Freq: Four times a day (QID) | ORAL | Status: DC | PRN

## 2014-07-15 NOTE — ED Notes (Signed)
PO challenge started.   Patient coughing during fluid challenge.  Dr. Madilyn Hook at bedside.

## 2014-07-15 NOTE — ED Provider Notes (Signed)
CSN: [id number]     Arrival date & time 07/15/14  0802 History   First MD Initiated Contact with Patient 07/15/14 0815     Chief Complaint  Patient presents with  . Allergic Reaction      Patient is a 54 y.o. female presenting with allergic reaction. The history is provided by the patient. No language interpreter was used.  Allergic Reaction  Wendy Woods presents for evaluation of allergic reaction.  About 7 PM last night she noticed swelling to her tongue and itching rash to her right upper arm. She feels as if there is some swelling in her throat as well. She has difficulty breathing when she lays back. It is hard for her to swallow but she is able swallow. She denies any new medications or exposures to known allergens. She's had similar symptoms previously but they were much milder in the past. She took Benadryl at home for the swelling and it helped her sleep but did not seem to change the swelling at all. The tongue swelling feels similar to before, but the rash on her arm feels like it's larger and more itchy than previously. Symptoms are moderate, constant, worsening.  Past Medical History  Diagnosis Date  . Hypertension   . Rheumatoid arthritis(714.0)   . Diabetes mellitus without complication   . Cataract   . GERD (gastroesophageal reflux disease)   . Hyperlipidemia    Past Surgical History  Procedure Laterality Date  . Cataract extraction  11-2013,12-2013    bilateral  . Appendectomy    . Myomectomy     Family History  Problem Relation Age of Onset  . Colon polyps Father   . Prostate cancer Father   . Colon cancer Neg Hx    History  Substance Use Topics  . Smoking status: Never Smoker   . Smokeless tobacco: Never Used  . Alcohol Use: No   OB History    No data available     Review of Systems  All other systems reviewed and are negative.     Allergies  Latex; Penicillins; Latex; and Penicillins  Home Medications   Prior to Admission medications    Medication Sig Start Date End Date Taking? Authorizing Provider  acetaminophen (TYLENOL) 500 MG tablet Take 1,000 mg by mouth every 6 (six) hours as needed for pain.    Historical Provider, MD  gabapentin (NEURONTIN) 300 MG capsule Take 300 mg by mouth 4 (four) times daily.    Historical Provider, MD  INVOKANA 100 MG TABS tablet  06/22/14   Historical Provider, MD  lisinopril-hydrochlorothiazide (PRINZIDE,ZESTORETIC) 20-25 MG per tablet Take 1 tablet by mouth daily.    Historical Provider, MD  metFORMIN (GLUCOPHAGE) 500 MG tablet Take 1 tablet (500 mg total) by mouth 2 (two) times daily with a meal. 07/06/12   Bynum Bellows, MD  Methotrexate, PF, 10 MG/0.2ML SOAJ Inject 1 Applicatorful into the skin once a week.    Historical Provider, MD  MOVIPREP 100 G SOLR Take 1 kit (200 g total) by mouth once. moviprep as directed. No substitutions 06/15/14   Jerene Bears, MD  omeprazole (PRILOSEC) 20 MG capsule Take 1 capsule (20 mg total) by mouth daily. Patient not taking: Reported on 06/15/2014 07/06/12   Bynum Bellows, MD  predniSONE (DELTASONE) 5 MG tablet Take 10-40 mg by mouth daily. Started 8 tabs daily for 2 weeks; then decrease to 7 tabs daily for 2 weeks, then decrease to 6 tabs daily for 2 weeks, continue  to decrease by 1 tablet daily every 2 weeks until goal of 2 tablets daily (continuously) is achieved.  Currently at 5 tablets daily for 2 weeks dose @ 07/03/12.    Historical Provider, MD  sodium chloride 0.9 % SOLN with inFLIXimab 100 MG SOLR Inject 5 mg/kg into the vein every 8 (eight) weeks. Pt states every 6 weeks    Historical Provider, MD   BP 137/90 mmHg  Pulse 81  Temp(Src) 98.3 F (36.8 C) (Oral)  Resp 18  SpO2 96%  LMP 02/12/2014 Physical Exam  Constitutional: She is oriented to person, place, and time. She appears well-developed and well-nourished.  HENT:  Head: Normocephalic and atraumatic.  Moderate diffuse swelling of the tongue. There is no appreciable swelling of the posterior  oropharynx. Swallow secretions.  Cardiovascular: Normal rate and regular rhythm.   No murmur heard. Pulmonary/Chest: Effort normal and breath sounds normal. No respiratory distress.  No stridor  Abdominal: Soft. There is no tenderness. There is no rebound and no guarding.  Musculoskeletal: She exhibits no edema or tenderness.  Neurological: She is alert and oriented to person, place, and time.  Skin: Skin is warm and dry.  Giant urticaria of the right upper arm.  Psychiatric: She has a normal mood and affect. Her behavior is normal.  Nursing note and vitals reviewed.   ED Course  Procedures (including critical care time) Labs Review Labs Reviewed  BASIC METABOLIC PANEL - Abnormal; Notable for the following:    Potassium 3.1 (*)    Glucose, Bld 109 (*)    Creatinine, Ser 1.11 (*)    GFR calc non Af Amer 56 (*)    GFR calc Af Amer 65 (*)    All other components within normal limits  CBC WITH DIFFERENTIAL/PLATELET - Abnormal; Notable for the following:    Platelets 420 (*)    All other components within normal limits  CBG MONITORING, ED - Abnormal; Notable for the following:    Glucose-Capillary 121 (*)    All other components within normal limits    Imaging Review No results found.   EKG Interpretation None      MDM   Final diagnoses:  Allergic reaction, initial encounter  Angioedema, initial encounter    Patient here for evaluation of tongue swelling and swelling on her right arm, initial evaluation was unclear if this is related to allergic reaction versus angioedema. Patient had no clinical response to medications making angioedema much more likely cause of her symptoms.  Recheck 1000 pt reports feeling improved with decreased tongue/throat swelling.  Exam is unchanged from prior.   Attempted by mouth challenge in patient with coughing and difficulty swallowing with by mouth challenge. After coughing spell her breathing improved. Discussed with medicine regarding  admission for observation given her swollen airway.   Quintella Reichert, MD 07/15/14 1600

## 2014-07-15 NOTE — ED Notes (Signed)
CBG 126  

## 2014-07-15 NOTE — ED Notes (Signed)
CBG 127 

## 2014-07-15 NOTE — H&P (Signed)
[facility] History and Physical                                                                                    Wendy Woods, is a 54 y.o. female  MRN: [id number]   DOB - 1961/01/10  Admit Date - 07/15/2014  Outpatient Primary MD for the patient is Dorrene German, MD  With History of -  Past Medical History  Diagnosis Date  . Hypertension   . Rheumatoid arthritis(714.0)   . Diabetes mellitus without complication   . Cataract   . GERD (gastroesophageal reflux disease)   . Hyperlipidemia       Past Surgical History  Procedure Laterality Date  . Cataract extraction  11-2013,12-2013    bilateral  . Appendectomy    . Myomectomy      in for   Chief Complaint  Patient presents with  . Allergic Reaction     HPI 54 year old female patient with past medical history of diabetes on Invokana, rheumatoid arthritis on prednisone and hypertension on ACE inhibitor who presented to the ER with acute onset of tongue and throat swelling as well as focal urticarial rash on right upper extremity. Patient reported symptoms began 24 hours prior after consuming a Chinese meal. She did not have any shortness of breath or stridor and has not had trouble swallowing her own secretions. She reports that 2 months ago she had a similar reaction with lip edema after consuming the re-dose that had red dye in them. Patient reports she has an allergy to red dye #6.  In the ER her respiratory status was stable, she had no stridor and was able to manage oral secretions although when initially tested was unable to swallow a sip of water. She was hemodynamically stable without evidence of shock or tachycardia. Laboratory data was unremarkable except for mild hypokalemia noting patient on thiazide diuretic prior to admission. In the ER the patient was given IV Benadryl, Solu-Medrol IV 125 mg and Pepcid. She has had minimal improvement in her symptoms noting she is now able to swallow a sip of water but  still has a muffled sounding quality to her voice.   Wendy Woods  is a 55 y.o. female,   Review of Systems   In addition to the HPI above,  No Fever-chills, myalgias or other constitutional symptoms No Headache, changes with Vision or hearing, new weakness, tingling, numbness in any extremity, No problems swallowing food or Liquids, indigestion/reflux No Chest pain, Cough or Shortness of Breath, palpitations, orthopnea or DOE No Abdominal pain, N/V; no melena or hematochezia, no dark tarry stools, Bowel movements are regular, No dysuria, hematuria or flank pain No new skin rashes, lesions, masses or bruises, No new joints pains-aches No recent weight gain or loss No polyuria, polydypsia or polyphagia,  *A full 10 point Review of Systems was done, except as stated above, all other Review of Systems were negative.  Social History History  Substance Use Topics  . Smoking status: Never Smoker   . Smokeless tobacco: Never Used  . Alcohol Use: No    Family History Family History  Problem Relation Age of Onset  . Colon  polyps Father   . Prostate cancer Father   . Colon cancer Neg Hx     Prior to Admission medications   Medication Sig Start Date End Date Taking? Authorizing Provider  acetaminophen (TYLENOL) 500 MG tablet Take 1,000 mg by mouth every 6 (six) hours as needed for pain.   Yes Historical Provider, MD  gabapentin (NEURONTIN) 300 MG capsule Take 300 mg by mouth 4 (four) times daily.   Yes Historical Provider, MD  INVOKANA 100 MG TABS tablet Take 100 mg by mouth daily.  06/22/14  Yes Historical Provider, MD  metFORMIN (GLUCOPHAGE) 500 MG tablet Take 1 tablet (500 mg total) by mouth 2 (two) times daily with a meal. 07/06/12  Yes Srikar Cherlynn Kaiser, MD  Methotrexate, PF, 10 MG/0.2ML SOAJ Inject 1 Applicatorful into the skin once a week.   Yes Historical Provider, MD  sodium chloride 0.9 % SOLN with inFLIXimab 100 MG SOLR Inject 5 mg/kg into the vein every 8 (eight) weeks. Pt  states every 6 weeks   Yes Historical Provider, MD  diphenhydrAMINE (BENADRYL) 25 MG tablet Take 1 tablet (25 mg total) by mouth every 6 (six) hours. 07/15/14   Tilden Fossa, MD  famotidine (PEPCID) 20 MG tablet Take 1 tablet (20 mg total) by mouth 2 (two) times daily. 07/15/14   Tilden Fossa, MD  hydrochlorothiazide (HYDRODIURIL) 25 MG tablet Take 1 tablet (25 mg total) by mouth daily. 07/15/14   Tilden Fossa, MD  predniSONE (DELTASONE) 10 MG tablet Take 4 tablets (40 mg total) by mouth daily. 07/15/14   Tilden Fossa, MD    Allergies  Allergen Reactions  . Latex Anaphylaxis, Hives, Itching, Swelling, Rash and Other (See Comments)    Causes bad reactions.    . Penicillins Anaphylaxis, Hives, Itching, Nausea And Vomiting, Rash and Other (See Comments)    Loss of consciousness also.   . Latex   . Penicillins     Physical Exam  Vitals  Blood pressure 131/76, pulse 70, temperature 98.3 F (36.8 C), temperature source Oral, resp. rate 16, last menstrual period 02/12/2014, SpO2 96 %.   General:  In no acute distress, appears healthy and well nourished  Psych:  Normal affect, Denies Suicidal or Homicidal ideations, Awake Alert, Oriented X 3. Speech and thought patterns are clear and appropriate, no apparent short term memory deficits  Neuro:   No focal neurological deficits, CN II through XII intact, Strength 5/5 all 4 extremities, Sensation intact all 4 extremities.  ENT:  Ears and Eyes appear Normal, Conjunctivae clear, PER. Moist oral mucosa without erythema or exudates. Tongue is swollen but uvula elevates properly without any visible posterior pharyngeal airway obstruction  Neck:  Supple, No lymphadenopathy appreciated  Respiratory:  Symmetrical chest wall movement, Good air movement bilaterally, CTAB without any wheezing or stridorous respiratory effort. Room Air  Cardiac:  RRR, No Murmurs, no LE edema noted, no JVD, No carotid bruits, peripheral pulses palpable at  2+  Abdomen:  Positive bowel sounds, Soft, Non tender, Non distended,  No masses appreciated, no obvious hepatosplenomegaly  Skin:  No Cyanosis, Normal Skin Turgor, focal linear circumferential urticarial rash right upper extremity in the bicep region  Extremities: Symmetrical without obvious trauma or injury,  no effusions.  Data Review  CBC  Recent Labs Lab 07/15/14 0840  WBC 10.2  HGB 13.4  HCT 39.7  PLT 420*  MCV 91.3  MCH 30.8  MCHC 33.8  RDW 13.8  LYMPHSABS 3.0  MONOABS 0.7  EOSABS 0.3  BASOSABS  0.0    Chemistries   Recent Labs Lab 07/15/14 0840  NA 137  K 3.1*  CL 98  CO2 30  GLUCOSE 109*  BUN 17  CREATININE 1.11*  CALCIUM 10.1    estimated creatinine clearance is 60.6 mL/min (by C-G formula based on Cr of 1.11).  No results for input(s): TSH, T4TOTAL, T3FREE, THYROIDAB in the last 72 hours.  Invalid input(s): FREET3  Coagulation profile No results for input(s): INR, PROTIME in the last 168 hours.  No results for input(s): DDIMER in the last 72 hours.  Cardiac Enzymes No results for input(s): CKMB, TROPONINI, MYOGLOBIN in the last 168 hours.  Invalid input(s): CK  Invalid input(s): POCBNP  Urinalysis    Component Value Date/Time   COLORURINE YELLOW 07/03/2012 1641   APPEARANCEUR CLEAR 07/03/2012 1641   LABSPEC 1.035* 07/03/2012 1641   PHURINE 5.0 07/03/2012 1641   GLUCOSEU >1000* 07/03/2012 1641   HGBUR TRACE* 07/03/2012 1641   BILIRUBINUR NEGATIVE 07/03/2012 1641   KETONESUR NEGATIVE 07/03/2012 1641   PROTEINUR NEGATIVE 07/03/2012 1641   UROBILINOGEN 0.2 07/03/2012 1641   NITRITE NEGATIVE 07/03/2012 1641   LEUKOCYTESUR NEGATIVE 07/03/2012 1641    Imaging results:   No results found.   Assessment & Plan  Principal Problem:   Angioedema -Admit to stepdown -Continue scheduled IV Solu-Medrol, Benadryl and Pepcid -We'll need EpiPen at discharge -Normally on prednisone 5 mg daily therefore will need much higher dosing for at  least the next 5-7 days in setting of acute allergic reaction -Discontinue ACE inhibitor since this is likely culprit for angioedema although certainly food allergies contributing as well -Clear liquids until can swallow safely then advance to carb modified diet  Active Problems:   Type 2 diabetes mellitus  -Based on reported history current outpatient diabetes well-controlled on Invokana alone -Watch for hyperglycemia with higher dose steroids -As precaution began Lantus 10 units daily -Provide moderate sliding scale insulin    HTN  -Replace ACE inhibitor with Norvasc -We'll need separate prescription for above medication as well as hydrochlorothiazide 25 mg at discharge    Hypokalemia -Oral replete and check lab in a.m.    Rheumatoid arthritis -Current low-dose prednisone on hold in favor of Solu-Medrol; recommend prednisone taper as above    DVT Prophylaxis: Subcutaneous heparin  Family Communication:   No family at bedside  Code Status:   Full code  Condition:  Stable  Time spent in minutes : 60   Wendy Woods L. ANP on 07/15/2014 at 11:18 AM  Between 7am to 7pm - Pager - [phone]  After 7pm go to [email or web address] - password [email or web address]  And look for the night coverage person covering me after hours  [facility]

## 2014-07-15 NOTE — Discharge Instructions (Signed)
Your reaction is related to allergy - possibly from your blood pressure medication (lisinopril).  Stop taking this immediately.  Follow up with your doctor in the next 2-3 days for recheck.  Return to the Emergency Room immediately if you develop worsening symptoms or breathing difficulties.   Angioedema Angioedema is a sudden swelling of tissues, often of the skin. It can occur on the face or genitals or in the abdomen or other body parts. The swelling usually develops over a short period and gets better in 24 to 48 hours. It often begins during the night and is found when the person wakes up. The person may also get red, itchy patches of skin (hives). Angioedema can be dangerous if it involves swelling of the air passages.  Depending on the cause, episodes of angioedema may only happen once, come back in unpredictable patterns, or repeat for several years and then gradually fade away.  CAUSES  Angioedema can be caused by an allergic reaction to various triggers. It can also result from nonallergic causes, including reactions to drugs, immune system disorders, viral infections, or an abnormal gene that is passed to you from your parents (hereditary). For some people with angioedema, the cause is unknown.  Some things that can trigger angioedema include:   Foods.   Medicines, such as ACE inhibitors, ARBs, nonsteroidal anti-inflammatory agents, or estrogen.   Latex.   Animal saliva.   Insect stings.   Dyes used in X-rays.   Mild injury.   Dental work.  Surgery.  Stress.   Sudden changes in temperature.   Exercise. SIGNS AND SYMPTOMS   Swelling of the skin.  Hives. If these are present, there is also intense itching.  Redness in the affected area.   Pain in the affected area.  Swollen lips or tongue.  Breathing problems. This may happen if the air passages swell.  Wheezing. If internal organs are involved, there may be:   Nausea.   Abdominal pain.    Vomiting.   Difficulty swallowing.   Difficulty passing urine. DIAGNOSIS   Your health care provider will examine the affected area and take a medical and family history.  Various tests may be done to help determine the cause. Tests may include:  Allergy skin tests to see if the problem is an allergic reaction.   Blood tests to check for hereditary angioedema.   Tests to check for underlying diseases that could cause the condition.   A review of your medicines, including over-the-counter medicines, may be done. TREATMENT  Treatment will depend on the cause of the angioedema. Possible treatments include:   Removal of anything that triggered the condition (such as stopping certain medicines).   Medicines to treat symptoms or prevent attacks. Medicines given may include:   Antihistamines.   Epinephrine injection.   Steroids.   Hospitalization may be required for severe attacks. If the air passages are affected, it can be an emergency. Tubes may need to be placed to keep the airway open. HOME CARE INSTRUCTIONS   Take all medicines as directed by your health care provider.  If you were given medicines for emergency allergy treatment, always carry them with you.  Wear a medical bracelet as directed by your health care provider.   Avoid known triggers. SEEK MEDICAL CARE IF:   You have repeat attacks of angioedema.   Your attacks are more frequent or more severe despite preventive measures.   You have hereditary angioedema and are considering having children. It is important to  discuss with your health care provider the risks of passing the condition on to your children. SEEK IMMEDIATE MEDICAL CARE IF:   You have severe swelling of the mouth, tongue, or lips.  You have difficulty breathing.   You have difficulty swallowing.   You faint. MAKE SURE YOU:  Understand these instructions.  Will watch your condition.  Will get help right away if you  are not doing well or get worse. Document Released: 06/26/2001 Document Revised: 09/01/2013 Document Reviewed: 12/09/2012 [facility] Patient Information 2015 [place], [place]. This information is not intended to replace advice given to you by your health care provider. Make sure you discuss any questions you have with your health care provider. Allergies Allergies may happen from anything your body is sensitive to. This may be food, medicines, pollens, chemicals, and nearly anything around you in everyday life that produces allergens. An allergen is anything that causes an allergy producing substance. Heredity is often a factor in causing these problems. This means you may have some of the same allergies as your parents. Food allergies happen in all age groups. Food allergies are some of the most severe and life threatening. Some common food allergies are cow's milk, seafood, eggs, nuts, wheat, and soybeans. SYMPTOMS   Swelling around the mouth.  An itchy red rash or hives.  Vomiting or diarrhea.  Difficulty breathing. SEVERE ALLERGIC REACTIONS ARE LIFE-THREATENING. This reaction is called anaphylaxis. It can cause the mouth and throat to swell and cause difficulty with breathing and swallowing. In severe reactions only a trace amount of food (for example, peanut oil in a salad) may cause death within seconds. Seasonal allergies occur in all age groups. These are seasonal because they usually occur during the same season every year. They may be a reaction to molds, grass pollens, or tree pollens. Other causes of problems are house dust mite allergens, pet dander, and mold spores. The symptoms often consist of nasal congestion, a runny itchy nose associated with sneezing, and tearing itchy eyes. There is often an associated itching of the mouth and ears. The problems happen when you come in contact with pollens and other allergens. Allergens are the particles in the air that the body reacts to with an  allergic reaction. This causes you to release allergic antibodies. Through a chain of events, these eventually cause you to release histamine into the blood stream. Although it is meant to be protective to the body, it is this release that causes your discomfort. This is why you were given anti-histamines to feel better. If you are unable to pinpoint the offending allergen, it may be determined by skin or blood testing. Allergies cannot be cured but can be controlled with medicine. Hay fever is a collection of all or some of the seasonal allergy problems. It may often be treated with simple over-the-counter medicine such as diphenhydramine. Take medicine as directed. Do not drink alcohol or drive while taking this medicine. Check with your caregiver or package insert for child dosages. If these medicines are not effective, there are many new medicines your caregiver can prescribe. Stronger medicine such as nasal spray, eye drops, and corticosteroids may be used if the first things you try do not work well. Other treatments such as immunotherapy or desensitizing injections can be used if all else fails. Follow up with your caregiver if problems continue. These seasonal allergies are usually not life threatening. They are generally more of a nuisance that can often be handled using medicine. HOME CARE INSTRUCTIONS  If unsure what causes a reaction, keep a diary of foods eaten and symptoms that follow. Avoid foods that cause reactions.  If hives or rash are present:  Take medicine as directed.  You may use an over-the-counter antihistamine (diphenhydramine) for hives and itching as needed.  Apply cold compresses (cloths) to the skin or take baths in cool water. Avoid hot baths or showers. Heat will make a rash and itching worse.  If you are severely allergic:  Following a treatment for a severe reaction, hospitalization is often required for closer follow-up.  Wear a medic-alert bracelet or  necklace stating the allergy.  You and your family must learn how to give adrenaline or use an anaphylaxis kit.  If you have had a severe reaction, always carry your anaphylaxis kit or EpiPen with you. Use this medicine as directed by your caregiver if a severe reaction is occurring. Failure to do so could have a fatal outcome. SEEK MEDICAL CARE IF:  You suspect a food allergy. Symptoms generally happen within 30 minutes of eating a food.  Your symptoms have not gone away within 2 days or are getting worse.  You develop new symptoms.  You want to retest yourself or your child with a food or drink you think causes an allergic reaction. Never do this if an anaphylactic reaction to that food or drink has happened before. Only do this under the care of a caregiver. SEEK IMMEDIATE MEDICAL CARE IF:   You have difficulty breathing, are wheezing, or have a tight feeling in your chest or throat.  You have a swollen mouth, or you have hives, swelling, or itching all over your body.  You have had a severe reaction that has responded to your anaphylaxis kit or an EpiPen. These reactions may return when the medicine has worn off. These reactions should be considered life threatening. MAKE SURE YOU:   Understand these instructions.  Will watch your condition.  Will get help right away if you are not doing well or get worse. Document Released: 07/11/2002 Document Revised: 08/12/2012 Document Reviewed: 12/16/2007 [facility] Patient Information 2015 [place], [place]. This information is not intended to replace advice given to you by your health care provider. Make sure you discuss any questions you have with your health care provider.

## 2014-07-15 NOTE — ED Notes (Signed)
Pt here for tongue swelling and arm swelling. sts started yesterday.pt sig swelling to tongue. Pt sts unsure of what caused the reaction. sts troub;e breathing when lying flat.

## 2014-07-15 NOTE — ED Notes (Signed)
Dr. Rees at bedside  

## 2014-07-15 NOTE — ED Notes (Signed)
Patient has swelling to the underside of the right upper arm below the armpit.  Area under arm is swollen with tenderness.

## 2014-07-16 DIAGNOSIS — E119 Type 2 diabetes mellitus without complications: Secondary | ICD-10-CM

## 2014-07-16 DIAGNOSIS — I10 Essential (primary) hypertension: Secondary | ICD-10-CM

## 2014-07-16 DIAGNOSIS — E876 Hypokalemia: Secondary | ICD-10-CM | POA: Diagnosis not present

## 2014-07-16 DIAGNOSIS — T783XXA Angioneurotic edema, initial encounter: Secondary | ICD-10-CM | POA: Diagnosis not present

## 2014-07-16 LAB — BASIC METABOLIC PANEL
Anion gap: 9 (ref 5–15)
BUN: 18 mg/dL (ref 6–23)
CALCIUM: 9.4 mg/dL (ref 8.4–10.5)
CHLORIDE: 102 mmol/L (ref 96–112)
CO2: 25 mmol/L (ref 19–32)
CREATININE: 1 mg/dL (ref 0.50–1.10)
GFR, EST AFRICAN AMERICAN: 73 mL/min — AB (ref >90)
GFR, EST NON AFRICAN AMERICAN: 63 mL/min — AB (ref >90)
Glucose, Bld: 128 mg/dL — ABNORMAL HIGH (ref 70–99)
Potassium: 3.6 mmol/L (ref 3.5–5.1)
SODIUM: 136 mmol/L (ref 135–145)

## 2014-07-16 LAB — GLUCOSE, CAPILLARY: Glucose-Capillary: 136 mg/dL — ABNORMAL HIGH (ref 70–99)

## 2014-07-16 MED ORDER — INSULIN PEN NEEDLE 29G X 12.7MM MISC
10.0000 [IU] | Freq: Every day | Status: DC
Start: 2014-07-16 — End: 2017-08-06

## 2014-07-16 MED ORDER — FAMOTIDINE 20 MG PO TABS: 20.0000 mg | ORAL_TABLET | Freq: Two times a day (BID) | ORAL | Status: AC

## 2014-07-16 MED ORDER — EPINEPHRINE 0.3 MG/0.3ML IJ SOAJ: 0.3000 mg | Freq: Once | INTRAMUSCULAR | Status: DC

## 2014-07-16 MED ORDER — INSULIN GLARGINE 100 UNIT/ML SOLOSTAR PEN
10.0000 [IU] | PEN_INJECTOR | Freq: Every day | SUBCUTANEOUS | Status: DC
Start: 2014-07-16 — End: 2014-07-16

## 2014-07-16 MED ORDER — PREDNISONE 10 MG PO TABS: 40.0000 mg | ORAL_TABLET | Freq: Every day | ORAL | Status: DC

## 2014-07-16 MED ORDER — DIPHENHYDRAMINE HCL 25 MG PO TABS: 25.0000 mg | ORAL_TABLET | Freq: Four times a day (QID) | ORAL | Status: DC

## 2014-07-16 MED ORDER — AMLODIPINE BESYLATE 5 MG PO TABS: 5.0000 mg | ORAL_TABLET | Freq: Every day | ORAL | Status: AC

## 2014-07-16 MED ORDER — INSULIN GLARGINE 100 UNIT/ML SOLOSTAR PEN: 10.0000 [IU] | PEN_INJECTOR | Freq: Every day | SUBCUTANEOUS | Status: DC

## 2014-07-16 MED ORDER — EPINEPHRINE HCL 1 MG/ML IJ SOLN: 1.0000 mg | INTRAMUSCULAR | Status: DC | PRN

## 2014-07-16 MED ORDER — HYDROCHLOROTHIAZIDE 25 MG PO TABS: 25.0000 mg | ORAL_TABLET | Freq: Every day | ORAL | Status: DC

## 2014-07-16 MED ORDER — AMLODIPINE BESYLATE 5 MG PO TABS: 5.0000 mg | ORAL_TABLET | Freq: Every day | ORAL | Status: DC

## 2014-07-16 MED ORDER — INJ DEVICE NEEDLE-FREE/INSULIN DEVI
10.0000 [IU] | Freq: Every day | Status: DC
Start: 2014-07-16 — End: 2014-07-16

## 2014-07-16 MED ORDER — PREDNISONE 50 MG PO TABS
60.0000 mg | ORAL_TABLET | Freq: Every day | ORAL | Status: DC
  Filled 2014-07-16: qty 1

## 2014-07-16 NOTE — Discharge Summary (Addendum)
Physician Discharge Summary  Nashira Mcglynn LAG:[id number] DOB: Mar 13, 1961 DOA: 07/15/2014  PCP: Dorrene German, MD  Admit date: 07/15/2014 Discharge date: 07/16/2014  Time spent: 40 minutes  Recommendations for Outpatient Follow-up:  Angioedema -Continue scheduled IV Solu-Medrol, Benadryl and Pepcid -Discharge with EpiPen -Normally on prednisone 5 mg daily therefore will discharge on prednisone 40 mg daily for 7 days in setting of acute allergic reaction. -Discontinue ACE inhibitor since this is likely culprit for angioedema although certainly food allergies contributing as well -Follow-up with PCP I recommended that patient have an updated allergy test performed   Type 2 diabetes mellitus  -Based on reported history current outpatient diabetes well-controlled on Invokana alone -Watch for hyperglycemia with higher dose steroids -Discharge on Lantus 10 units daily, secondary to increased steroid use -Follow-up with PCP   HTN  -Replace ACE inhibitor with Norvasc 5 mg daily -HCTZ 25 mg daily -We'll need separate prescription for above medication as well as hydrochlorothiazide 25 mg at discharge   Hypokalemia -Oral replete and check lab in a.m.   Rheumatoid arthritis -Will be covered by high-dose steroids for 7 days, then return to her previous home dose of Current low-dose prednisone 5 mg daily. -Follow-up with PCP within one week   Discharge Diagnoses:  Principal Problem:   Angioedema Active Problems:   Diabetes mellitus, controlled   HTN (hypertension)   Rheumatoid arthritis   Hypokalemia   Discharge Condition: Stable  Diet recommendation: Heart healthy/diabetic  Filed Weights   07/15/14 1835  Weight: 85.14 kg (187 lb 11.2 oz)    History of present illness:  54 year old BF PMHx diabetes on Invokana, rheumatoid arthritis on prednisone and hypertension on ACE inhibitor who presented to the ER with acute onset of tongue and throat swelling as well as focal  urticarial rash on right upper extremity. Patient reported symptoms began 24 hours prior after consuming a Chinese meal. She did not have any shortness of breath or stridor and has not had trouble swallowing her own secretions. She reports that 2 months ago she had a similar reaction with lip edema after consuming the re-dose that had red dye in them. Patient reports she has an allergy to red dye #6.  In the ER her respiratory status was stable, she had no stridor and was able to manage oral secretions although when initially tested was unable to swallow a sip of water. She was hemodynamically stable without evidence of shock or tachycardia. Laboratory data was unremarkable except for mild hypokalemia noting patient on thiazide diuretic prior to admission. In the ER the patient was given IV Benadryl, Solu-Medrol IV 125 mg and Pepcid. She has had minimal improvement in her symptoms noting she is now able to swallow a sip of water but still has a muffled sounding quality to her voice. Patient currently back to baseline, facial swelling, lip swelling, tongue swelling, shortness of breath has all resolved. Patient believes it was secondary team the Duck sauce at a [employer].     Discharge Exam: Filed Vitals:   07/16/14 0729 07/16/14 0800 07/16/14 1000 07/16/14 1141  BP: 115/66 132/61 122/61 122/61  Pulse: 57 50 68   Temp: 97.9 F (36.6 C)     TempSrc: Oral     Resp: 14 13 14    Height:      Weight:      SpO2: 100% 96% 100%     General: A/O 4, NAD, angioedema completely resolved. Cardiovascular: Regular rhythm or rate, negative murmurs rubs or gallops, normal S1/S2 Respiratory:  Clear to auscultation bilateral  Discharge Instructions     Medication List    STOP taking these medications        lisinopril-hydrochlorothiazide 20-25 MG per tablet  Commonly known as:  PRINZIDE,ZESTORETIC     MOVIPREP 100 G Solr  Generic drug:  peg 3350 powder     omeprazole 20 MG capsule   Commonly known as:  PRILOSEC     sodium chloride 0.9 % SOLN with inFLIXimab 100 MG SOLR      TAKE these medications        acetaminophen 500 MG tablet  Commonly known as:  TYLENOL  Take 1,000 mg by mouth every 6 (six) hours as needed for pain.     amLODipine 5 MG tablet  Commonly known as:  NORVASC  Take 1 tablet (5 mg total) by mouth daily.     diphenhydrAMINE 25 MG tablet  Commonly known as:  BENADRYL  Take 1 tablet (25 mg total) by mouth every 6 (six) hours.     EPINEPHrine 0.3 mg/0.3 mL Soaj injection  Commonly known as:  EPIPEN 2-PAK  Inject 0.3 mLs (0.3 mg total) into the muscle once.     famotidine 20 MG tablet  Commonly known as:  PEPCID  Take 1 tablet (20 mg total) by mouth 2 (two) times daily.     gabapentin 300 MG capsule  Commonly known as:  NEURONTIN  Take 300 mg by mouth 4 (four) times daily.     hydrochlorothiazide 25 MG tablet  Commonly known as:  HYDRODIURIL  Take 1 tablet (25 mg total) by mouth daily.     Insulin Glargine 100 UNIT/ML Solostar Pen  Commonly known as:  LANTUS SOLOSTAR  Inject 10 Units into the skin daily at 10 pm.     Insulin Pen Needle 29G X 12.7MM Misc  10 Units by Does not apply route at bedtime.     INVOKANA 100 MG Tabs tablet  Generic drug:  canagliflozin  Take 100 mg by mouth daily.     metFORMIN 500 MG tablet  Commonly known as:  GLUCOPHAGE  Take 1 tablet (500 mg total) by mouth 2 (two) times daily with a meal.     Methotrexate (PF) 10 MG/0.2ML Soaj  Inject 1 Applicatorful into the skin once a week.     predniSONE 10 MG tablet  Commonly known as:  DELTASONE  Take 4 tablets (40 mg total) by mouth daily.       Allergies  Allergen Reactions  . Latex Anaphylaxis, Hives, Itching, Swelling, Rash and Other (See Comments)    Causes bad reactions.    . Penicillins Anaphylaxis, Hives, Itching, Nausea And Vomiting, Rash and Other (See Comments)    Loss of consciousness also.   . Latex   . Penicillins    Follow-up  Information    Follow up with AVBUERE,EDWIN A, MD In 2 days.   Specialty:  Internal Medicine   Contact information:   [address] [name] [address] [phone]        The results of significant diagnostics from this hospitalization (including imaging, microbiology, ancillary and laboratory) are listed below for reference.    Significant Diagnostic Studies: No results found.  Microbiology: Recent Results (from the past 240 hour(s))  MRSA PCR Screening     Status: None   Collection Time: 07/15/14  6:00 PM  Result Value Ref Range Status   MRSA by PCR NEGATIVE NEGATIVE Final    Comment:  The GeneXpert MRSA Assay (FDA approved for NASAL specimens only), is one component of a comprehensive MRSA colonization surveillance program. It is not intended to diagnose MRSA infection nor to guide or monitor treatment for MRSA infections.      Labs: Basic Metabolic Panel:  Recent Labs Lab 07/15/14 0840 07/16/14 0312  NA 137 136  K 3.1* 3.6  CL 98 102  CO2 30 25  GLUCOSE 109* 128*  BUN 17 18  CREATININE 1.11* 1.00  CALCIUM 10.1 9.4   Liver Function Tests: No results for input(s): AST, ALT, ALKPHOS, BILITOT, PROT, ALBUMIN in the last 168 hours. No results for input(s): LIPASE, AMYLASE in the last 168 hours. No results for input(s): AMMONIA in the last 168 hours. CBC:  Recent Labs Lab 07/15/14 0840  WBC 10.2  NEUTROABS 6.1  HGB 13.4  HCT 39.7  MCV 91.3  PLT 420*   Cardiac Enzymes: No results for input(s): CKTOTAL, CKMB, CKMBINDEX, TROPONINI in the last 168 hours. BNP: BNP (last 3 results) No results for input(s): BNP in the last 8760 hours.  ProBNP (last 3 results) No results for input(s): PROBNP in the last 8760 hours.  CBG:  Recent Labs Lab 07/15/14 1220 07/15/14 1646 07/15/14 1742 07/15/14 2104 07/16/14 0736  GLUCAP 121* 127* 126* 173* 136*       Signed:  Carolyne Littles, MD [facility] [phone] pager

## 2014-07-16 NOTE — Progress Notes (Addendum)
[facility] called to seek clarification regarding d/c medication orders. Dr. Joseph Art paged. Await response.

## 2014-07-16 NOTE — Progress Notes (Signed)
Spoke with Dr. Joseph Art. He clarified pt orders to continue HCTZ with Norvasc in lieu of ace inhibitor to take lantus per order while on prednisone.  Pt advised of this and to be sure to follow up with PCP in 2 days. RN called PCP office and scheduled for first available appointment which is Tuesday 3/22 at 1115 at [facility]. Pt given information and address of MD office with D/C instructions. Pt d/c home at 1302.

## 2014-08-13 ENCOUNTER — Encounter (HOSPITAL_COMMUNITY): Payer: Self-pay

## 2014-08-17 ENCOUNTER — Encounter (HOSPITAL_COMMUNITY): Payer: Self-pay

## 2014-08-27 ENCOUNTER — Encounter (HOSPITAL_COMMUNITY)
Admission: RE | Admit: 2014-08-27 | Discharge: 2014-08-27 | Disposition: A | Discharge status: Home / Self Care | Payer: Medicaid Other | Source: Ambulatory Visit | Attending: Rheumatology | Admitting: Rheumatology

## 2014-08-27 DIAGNOSIS — M069 Rheumatoid arthritis, unspecified: Secondary | ICD-10-CM | POA: Diagnosis present

## 2014-08-27 LAB — CBC
HEMATOCRIT: 40.6 % (ref 36.0–46.0)
Hemoglobin: 13.8 g/dL (ref 12.0–15.0)
MCH: 30.7 pg (ref 26.0–34.0)
MCHC: 34 g/dL (ref 30.0–36.0)
MCV: 90.4 fL (ref 78.0–100.0)
Platelets: 140 10*3/uL — ABNORMAL LOW (ref 150–400)
RBC: 4.49 MIL/uL (ref 3.87–5.11)
RDW: 13.7 % (ref 11.5–15.5)
WBC: 7.6 10*3/uL (ref 4.0–10.5)

## 2014-08-27 LAB — COMPREHENSIVE METABOLIC PANEL
ALBUMIN: 3.6 g/dL (ref 3.5–5.2)
ALT: 19 U/L (ref 0–35)
AST: 44 U/L — ABNORMAL HIGH (ref 0–37)
Alkaline Phosphatase: 42 U/L (ref 39–117)
Anion gap: 11 (ref 5–15)
BUN: 20 mg/dL (ref 6–23)
CALCIUM: 9.6 mg/dL (ref 8.4–10.5)
CHLORIDE: 96 mmol/L (ref 96–112)
CO2: 28 mmol/L (ref 19–32)
CREATININE: 1.07 mg/dL (ref 0.50–1.10)
GFR calc Af Amer: 67 mL/min — ABNORMAL LOW (ref >90)
GFR calc non Af Amer: 58 mL/min — ABNORMAL LOW (ref >90)
GLUCOSE: 151 mg/dL — AB (ref 70–99)
Potassium: 4.7 mmol/L (ref 3.5–5.1)
Sodium: 135 mmol/L (ref 135–145)
TOTAL PROTEIN: 6.9 g/dL (ref 6.0–8.3)
Total Bilirubin: 1.4 mg/dL — ABNORMAL HIGH (ref 0.3–1.2)

## 2014-08-27 LAB — DIFFERENTIAL
Basophils Absolute: 0 10*3/uL (ref 0.0–0.1)
Basophils Relative: 1 % (ref 0–1)
Eosinophils Absolute: 0.1 10*3/uL (ref 0.0–0.7)
Eosinophils Relative: 2 % (ref 0–5)
Lymphocytes Relative: 40 % (ref 12–46)
Lymphs Abs: 3.1 10*3/uL (ref 0.7–4.0)
MONO ABS: 0.5 10*3/uL (ref 0.1–1.0)
MONOS PCT: 6 % (ref 3–12)
NEUTROS ABS: 3.9 10*3/uL (ref 1.7–7.7)
NEUTROS PCT: 51 % (ref 43–77)

## 2014-08-27 MED ORDER — ACETAMINOPHEN 325 MG PO TABS
650.0000 mg | ORAL_TABLET | Freq: Once | ORAL | Status: AC
  Administered 2014-08-27: 650 mg via ORAL

## 2014-08-27 MED ORDER — INFLIXIMAB 100 MG IV SOLR
500.0000 mg | INTRAVENOUS | Status: DC
  Administered 2014-08-27: 500 mg via INTRAVENOUS
  Filled 2014-08-27: qty 50

## 2014-08-27 MED ORDER — DIPHENHYDRAMINE HCL 25 MG PO CAPS
50.0000 mg | ORAL_CAPSULE | Freq: Once | ORAL | Status: AC
  Administered 2014-08-27: 25 mg via ORAL

## 2014-08-27 MED ORDER — SODIUM CHLORIDE 0.9 % IV SOLN
INTRAVENOUS | Status: DC
  Administered 2014-08-27: 09:00:00 via INTRAVENOUS

## 2014-08-27 MED ORDER — ACETAMINOPHEN 325 MG PO TABS
ORAL_TABLET | ORAL | Status: AC
  Administered 2014-08-27: 650 mg via ORAL
  Filled 2014-08-27: qty 2

## 2014-08-27 MED ORDER — DIPHENHYDRAMINE HCL 25 MG PO CAPS
ORAL_CAPSULE | ORAL | Status: AC
  Administered 2014-08-27: 25 mg via ORAL
  Filled 2014-08-27: qty 2

## 2014-10-07 ENCOUNTER — Other Ambulatory Visit (HOSPITAL_COMMUNITY): Payer: Self-pay | Admitting: *Deleted

## 2014-10-08 ENCOUNTER — Encounter (HOSPITAL_COMMUNITY)
Admission: RE | Admit: 2014-10-08 | Discharge: 2014-10-08 | Disposition: A | Discharge status: Home / Self Care | Payer: Medicaid Other | Source: Ambulatory Visit | Attending: Rheumatology | Admitting: Rheumatology

## 2014-10-08 DIAGNOSIS — M069 Rheumatoid arthritis, unspecified: Secondary | ICD-10-CM | POA: Insufficient documentation

## 2014-10-08 LAB — CBC
HCT: 40 % (ref 36.0–46.0)
Hemoglobin: 13.4 g/dL (ref 12.0–15.0)
MCH: 29.8 pg (ref 26.0–34.0)
MCHC: 33.5 g/dL (ref 30.0–36.0)
MCV: 89.1 fL (ref 78.0–100.0)
PLATELETS: 466 10*3/uL — AB (ref 150–400)
RBC: 4.49 MIL/uL (ref 3.87–5.11)
RDW: 13.1 % (ref 11.5–15.5)
WBC: 8 10*3/uL (ref 4.0–10.5)

## 2014-10-08 LAB — COMPREHENSIVE METABOLIC PANEL
ALT: 15 U/L (ref 14–54)
ANION GAP: 10 (ref 5–15)
AST: 21 U/L (ref 15–41)
Albumin: 3.4 g/dL — ABNORMAL LOW (ref 3.5–5.0)
Alkaline Phosphatase: 36 U/L — ABNORMAL LOW (ref 38–126)
BUN: 15 mg/dL (ref 6–20)
CO2: 28 mmol/L (ref 22–32)
CREATININE: 0.99 mg/dL (ref 0.44–1.00)
Calcium: 9.4 mg/dL (ref 8.9–10.3)
Chloride: 98 mmol/L — ABNORMAL LOW (ref 101–111)
GFR calc non Af Amer: >60 mL/min (ref >60)
GLUCOSE: 175 mg/dL — AB (ref 65–99)
POTASSIUM: 3.1 mmol/L — AB (ref 3.5–5.1)
SODIUM: 136 mmol/L (ref 135–145)
Total Bilirubin: 0.4 mg/dL (ref 0.3–1.2)
Total Protein: 6.9 g/dL (ref 6.5–8.1)

## 2014-10-08 LAB — DIFFERENTIAL
BASOS PCT: 0 % (ref 0–1)
Basophils Absolute: 0 10*3/uL (ref 0.0–0.1)
EOS PCT: 1 % (ref 0–5)
Eosinophils Absolute: 0 10*3/uL (ref 0.0–0.7)
LYMPHS PCT: 22 % (ref 12–46)
Lymphs Abs: 1.8 10*3/uL (ref 0.7–4.0)
MONOS PCT: 5 % (ref 3–12)
Monocytes Absolute: 0.4 10*3/uL (ref 0.1–1.0)
Neutro Abs: 5.8 10*3/uL (ref 1.7–7.7)
Neutrophils Relative %: 72 % (ref 43–77)

## 2014-10-08 MED ORDER — ACETAMINOPHEN 325 MG PO TABS
ORAL_TABLET | ORAL | Status: AC
  Filled 2014-10-08: qty 2

## 2014-10-08 MED ORDER — DIPHENHYDRAMINE HCL 25 MG PO CAPS
ORAL_CAPSULE | ORAL | Status: AC
  Filled 2014-10-08: qty 2

## 2014-10-08 MED ORDER — DIPHENHYDRAMINE HCL 25 MG PO CAPS
50.0000 mg | ORAL_CAPSULE | Freq: Once | ORAL | Status: AC
  Administered 2014-10-08: 50 mg via ORAL

## 2014-10-08 MED ORDER — INFLIXIMAB 100 MG IV SOLR
500.0000 mg | INTRAVENOUS | Status: DC
  Administered 2014-10-08: 500 mg via INTRAVENOUS
  Filled 2014-10-08: qty 50

## 2014-10-08 MED ORDER — ACETAMINOPHEN 325 MG PO TABS
650.0000 mg | ORAL_TABLET | Freq: Once | ORAL | Status: AC
  Administered 2014-10-08: 650 mg via ORAL

## 2014-10-08 MED ORDER — SODIUM CHLORIDE 0.9 % IV SOLN
INTRAVENOUS | Status: AC
  Administered 2014-10-08: 09:00:00 via INTRAVENOUS

## 2014-10-22 DIAGNOSIS — M069 Rheumatoid arthritis, unspecified: Secondary | ICD-10-CM | POA: Diagnosis not present

## 2014-10-26 ENCOUNTER — Other Ambulatory Visit: Payer: Self-pay

## 2014-11-18 ENCOUNTER — Encounter (HOSPITAL_COMMUNITY): Admission: RE | Admit: 2014-11-18 | Payer: Medicaid Other | Source: Ambulatory Visit

## 2014-11-19 ENCOUNTER — Encounter (HOSPITAL_COMMUNITY)
Admission: RE | Admit: 2014-11-19 | Discharge: 2014-11-19 | Disposition: A | Discharge status: Home / Self Care | Payer: Medicaid Other | Source: Ambulatory Visit | Attending: Rheumatology | Admitting: Rheumatology

## 2014-11-19 DIAGNOSIS — M069 Rheumatoid arthritis, unspecified: Secondary | ICD-10-CM | POA: Insufficient documentation

## 2014-11-19 LAB — COMPREHENSIVE METABOLIC PANEL
ALK PHOS: 44 U/L (ref 38–126)
ALT: 17 U/L (ref 14–54)
ANION GAP: 9 (ref 5–15)
AST: 20 U/L (ref 15–41)
Albumin: 3.7 g/dL (ref 3.5–5.0)
BUN: 15 mg/dL (ref 6–20)
CO2: 31 mmol/L (ref 22–32)
Calcium: 9.8 mg/dL (ref 8.9–10.3)
Chloride: 96 mmol/L — ABNORMAL LOW (ref 101–111)
Creatinine, Ser: 1.06 mg/dL — ABNORMAL HIGH (ref 0.44–1.00)
GFR calc Af Amer: >60 mL/min (ref >60)
GFR calc non Af Amer: 59 mL/min — ABNORMAL LOW (ref >60)
GLUCOSE: 220 mg/dL — AB (ref 65–99)
POTASSIUM: 3.1 mmol/L — AB (ref 3.5–5.1)
Sodium: 136 mmol/L (ref 135–145)
Total Bilirubin: 0.6 mg/dL (ref 0.3–1.2)
Total Protein: 8.1 g/dL (ref 6.5–8.1)

## 2014-11-19 LAB — CBC WITH DIFFERENTIAL/PLATELET
Basophils Absolute: 0 10*3/uL (ref 0.0–0.1)
Basophils Relative: 1 % (ref 0–1)
EOS ABS: 0.2 10*3/uL (ref 0.0–0.7)
Eosinophils Relative: 2 % (ref 0–5)
HCT: 41.8 % (ref 36.0–46.0)
HEMOGLOBIN: 13.8 g/dL (ref 12.0–15.0)
Lymphocytes Relative: 25 % (ref 12–46)
Lymphs Abs: 1.7 10*3/uL (ref 0.7–4.0)
MCH: 29.6 pg (ref 26.0–34.0)
MCHC: 33 g/dL (ref 30.0–36.0)
MCV: 89.7 fL (ref 78.0–100.0)
Monocytes Absolute: 0.4 10*3/uL (ref 0.1–1.0)
Monocytes Relative: 6 % (ref 3–12)
Neutro Abs: 4.7 10*3/uL (ref 1.7–7.7)
Neutrophils Relative %: 66 % (ref 43–77)
Platelets: 432 10*3/uL — ABNORMAL HIGH (ref 150–400)
RBC: 4.66 MIL/uL (ref 3.87–5.11)
RDW: 13.4 % (ref 11.5–15.5)
WBC: 7.1 10*3/uL (ref 4.0–10.5)

## 2014-11-19 MED ORDER — ACETAMINOPHEN 325 MG PO TABS
650.0000 mg | ORAL_TABLET | Freq: Once | ORAL | Status: AC
  Administered 2014-11-19: 650 mg via ORAL

## 2014-11-19 MED ORDER — SODIUM CHLORIDE 0.9 % IV SOLN
500.0000 mg | INTRAVENOUS | Status: AC
  Administered 2014-11-19: 500 mg via INTRAVENOUS
  Filled 2014-11-19: qty 50

## 2014-11-19 MED ORDER — ACETAMINOPHEN 325 MG PO TABS
ORAL_TABLET | ORAL | Status: AC
  Filled 2014-11-19: qty 2

## 2014-11-19 MED ORDER — DIPHENHYDRAMINE HCL 25 MG PO CAPS
50.0000 mg | ORAL_CAPSULE | Freq: Once | ORAL | Status: AC
  Administered 2014-11-19: 50 mg via ORAL

## 2014-11-19 MED ORDER — SODIUM CHLORIDE 0.9 % IV SOLN
INTRAVENOUS | Status: AC
  Administered 2014-11-19: 09:00:00 via INTRAVENOUS

## 2014-11-19 MED ORDER — DIPHENHYDRAMINE HCL 25 MG PO CAPS
ORAL_CAPSULE | ORAL | Status: AC
  Filled 2014-11-19: qty 1

## 2014-12-30 ENCOUNTER — Other Ambulatory Visit (HOSPITAL_COMMUNITY): Payer: Self-pay | Admitting: *Deleted

## 2014-12-31 ENCOUNTER — Encounter (HOSPITAL_COMMUNITY)
Admission: RE | Admit: 2014-12-31 | Discharge: 2014-12-31 | Disposition: A | Discharge status: Home / Self Care | Payer: Medicaid Other | Source: Ambulatory Visit | Attending: Rheumatology | Admitting: Rheumatology

## 2014-12-31 DIAGNOSIS — M069 Rheumatoid arthritis, unspecified: Secondary | ICD-10-CM | POA: Diagnosis present

## 2014-12-31 LAB — DIFFERENTIAL
BASOS ABS: 0.1 10*3/uL (ref 0.0–0.1)
Basophils Relative: 1 % (ref 0–1)
EOS ABS: 0.1 10*3/uL (ref 0.0–0.7)
Eosinophils Relative: 2 % (ref 0–5)
Lymphocytes Relative: 25 % (ref 12–46)
Lymphs Abs: 1.9 10*3/uL (ref 0.7–4.0)
MONOS PCT: 7 % (ref 3–12)
Monocytes Absolute: 0.5 10*3/uL (ref 0.1–1.0)
NEUTROS ABS: 4.9 10*3/uL (ref 1.7–7.7)
Neutrophils Relative %: 65 % (ref 43–77)

## 2014-12-31 LAB — COMPREHENSIVE METABOLIC PANEL
ALK PHOS: 45 U/L (ref 38–126)
ALT: 17 U/L (ref 14–54)
ANION GAP: 10 (ref 5–15)
AST: 23 U/L (ref 15–41)
Albumin: 3.6 g/dL (ref 3.5–5.0)
BILIRUBIN TOTAL: 0.6 mg/dL (ref 0.3–1.2)
BUN: 18 mg/dL (ref 6–20)
CO2: 27 mmol/L (ref 22–32)
CREATININE: 1.09 mg/dL — AB (ref 0.44–1.00)
Calcium: 9.6 mg/dL (ref 8.9–10.3)
Chloride: 99 mmol/L — ABNORMAL LOW (ref 101–111)
GFR, EST NON AFRICAN AMERICAN: 57 mL/min — AB (ref >60)
Glucose, Bld: 163 mg/dL — ABNORMAL HIGH (ref 65–99)
Potassium: 3.6 mmol/L (ref 3.5–5.1)
Sodium: 136 mmol/L (ref 135–145)
TOTAL PROTEIN: 7.9 g/dL (ref 6.5–8.1)

## 2014-12-31 LAB — CBC
HEMATOCRIT: 41.5 % (ref 36.0–46.0)
HEMOGLOBIN: 13.7 g/dL (ref 12.0–15.0)
MCH: 29.5 pg (ref 26.0–34.0)
MCHC: 33 g/dL (ref 30.0–36.0)
MCV: 89.2 fL (ref 78.0–100.0)
Platelets: 423 10*3/uL — ABNORMAL HIGH (ref 150–400)
RBC: 4.65 MIL/uL (ref 3.87–5.11)
RDW: 13.9 % (ref 11.5–15.5)
WBC: 7.5 10*3/uL (ref 4.0–10.5)

## 2014-12-31 MED ORDER — ACETAMINOPHEN 325 MG PO TABS
ORAL_TABLET | ORAL | Status: AC
  Filled 2014-12-31: qty 2

## 2014-12-31 MED ORDER — DIPHENHYDRAMINE HCL 25 MG PO TABS
50.0000 mg | ORAL_TABLET | ORAL | Status: DC
  Administered 2014-12-31: 50 mg via ORAL
  Filled 2014-12-31: qty 2

## 2014-12-31 MED ORDER — ACETAMINOPHEN 325 MG PO TABS
650.0000 mg | ORAL_TABLET | ORAL | Status: DC
  Administered 2014-12-31: 650 mg via ORAL

## 2014-12-31 MED ORDER — INFLIXIMAB 100 MG IV SOLR
6.0000 mg/kg | INTRAVENOUS | Status: DC
  Administered 2014-12-31: 500 mg via INTRAVENOUS
  Filled 2014-12-31: qty 50

## 2014-12-31 MED ORDER — DIPHENHYDRAMINE HCL 25 MG PO CAPS
ORAL_CAPSULE | ORAL | Status: AC
  Filled 2014-12-31: qty 2

## 2014-12-31 MED ORDER — SODIUM CHLORIDE 0.9 % IV SOLN
INTRAVENOUS | Status: DC
  Administered 2014-12-31: 09:00:00 via INTRAVENOUS

## 2015-02-11 ENCOUNTER — Encounter (HOSPITAL_COMMUNITY)
Admission: RE | Admit: 2015-02-11 | Discharge: 2015-02-11 | Disposition: A | Discharge status: Home / Self Care | Payer: Medicaid Other | Source: Ambulatory Visit | Attending: Rheumatology | Admitting: Rheumatology

## 2015-02-11 DIAGNOSIS — M069 Rheumatoid arthritis, unspecified: Secondary | ICD-10-CM | POA: Insufficient documentation

## 2015-02-11 LAB — CBC WITH DIFFERENTIAL/PLATELET
Basophils Absolute: 0 10*3/uL (ref 0.0–0.1)
Basophils Relative: 1 %
EOS PCT: 5 %
Eosinophils Absolute: 0.3 10*3/uL (ref 0.0–0.7)
HCT: 41.7 % (ref 36.0–46.0)
Hemoglobin: 13.7 g/dL (ref 12.0–15.0)
LYMPHS ABS: 3.1 10*3/uL (ref 0.7–4.0)
LYMPHS PCT: 45 %
MCH: 29.4 pg (ref 26.0–34.0)
MCHC: 32.9 g/dL (ref 30.0–36.0)
MCV: 89.5 fL (ref 78.0–100.0)
MONO ABS: 0.8 10*3/uL (ref 0.1–1.0)
MONOS PCT: 11 %
Neutro Abs: 2.6 10*3/uL (ref 1.7–7.7)
Neutrophils Relative %: 38 %
PLATELETS: 357 10*3/uL (ref 150–400)
RBC: 4.66 MIL/uL (ref 3.87–5.11)
RDW: 14 % (ref 11.5–15.5)
WBC: 6.9 10*3/uL (ref 4.0–10.5)

## 2015-02-11 LAB — COMPREHENSIVE METABOLIC PANEL
ALT: 20 U/L (ref 14–54)
AST: 28 U/L (ref 15–41)
Albumin: 3.6 g/dL (ref 3.5–5.0)
Alkaline Phosphatase: 40 U/L (ref 38–126)
Anion gap: 11 (ref 5–15)
BUN: 12 mg/dL (ref 6–20)
CHLORIDE: 101 mmol/L (ref 101–111)
CO2: 28 mmol/L (ref 22–32)
Calcium: 9.7 mg/dL (ref 8.9–10.3)
Creatinine, Ser: 1.02 mg/dL — ABNORMAL HIGH (ref 0.44–1.00)
Glucose, Bld: 121 mg/dL — ABNORMAL HIGH (ref 65–99)
POTASSIUM: 3.8 mmol/L (ref 3.5–5.1)
Sodium: 140 mmol/L (ref 135–145)
TOTAL PROTEIN: 7.4 g/dL (ref 6.5–8.1)
Total Bilirubin: 1.2 mg/dL (ref 0.3–1.2)

## 2015-02-11 MED ORDER — ACETAMINOPHEN 325 MG PO TABS
650.0000 mg | ORAL_TABLET | ORAL | Status: DC
  Administered 2015-02-11: 650 mg via ORAL

## 2015-02-11 MED ORDER — SODIUM CHLORIDE 0.9 % IV SOLN
INTRAVENOUS | Status: DC
  Administered 2015-02-11: 08:00:00 via INTRAVENOUS

## 2015-02-11 MED ORDER — ACETAMINOPHEN 325 MG PO TABS
ORAL_TABLET | ORAL | Status: AC
  Filled 2015-02-11: qty 2

## 2015-02-11 MED ORDER — SODIUM CHLORIDE 0.9 % IV SOLN
6.0000 mg/kg | INTRAVENOUS | Status: DC
  Administered 2015-02-11: 500 mg via INTRAVENOUS
  Filled 2015-02-11: qty 50

## 2015-02-11 MED ORDER — DIPHENHYDRAMINE HCL 25 MG PO CAPS: 50.0000 mg | ORAL_CAPSULE | ORAL | Status: DC

## 2015-02-11 MED ORDER — DIPHENHYDRAMINE HCL 25 MG PO CAPS: 25.0000 mg | ORAL_CAPSULE | ORAL | Status: DC

## 2015-02-11 MED ORDER — DIPHENHYDRAMINE HCL 25 MG PO CAPS
ORAL_CAPSULE | ORAL | Status: AC
  Administered 2015-02-11: 25 mg
  Filled 2015-02-11: qty 1

## 2015-03-24 ENCOUNTER — Encounter (HOSPITAL_COMMUNITY)
Admission: RE | Admit: 2015-03-24 | Discharge: 2015-03-24 | Disposition: A | Discharge status: Home / Self Care | Payer: Medicaid Other | Source: Ambulatory Visit | Attending: Rheumatology | Admitting: Rheumatology

## 2015-03-24 DIAGNOSIS — M069 Rheumatoid arthritis, unspecified: Secondary | ICD-10-CM | POA: Insufficient documentation

## 2015-03-24 LAB — CBC WITH DIFFERENTIAL/PLATELET
BASOS PCT: 1 %
Basophils Absolute: 0.1 10*3/uL (ref 0.0–0.1)
EOS ABS: 0.4 10*3/uL (ref 0.0–0.7)
EOS PCT: 5 %
HCT: 43 % (ref 36.0–46.0)
Hemoglobin: 14.5 g/dL (ref 12.0–15.0)
LYMPHS ABS: 3 10*3/uL (ref 0.7–4.0)
Lymphocytes Relative: 44 %
MCH: 30.4 pg (ref 26.0–34.0)
MCHC: 33.7 g/dL (ref 30.0–36.0)
MCV: 90.1 fL (ref 78.0–100.0)
MONO ABS: 0.5 10*3/uL (ref 0.1–1.0)
Monocytes Relative: 7 %
NEUTROS ABS: 3 10*3/uL (ref 1.7–7.7)
Neutrophils Relative %: 43 %
PLATELETS: 431 10*3/uL — AB (ref 150–400)
RBC: 4.77 MIL/uL (ref 3.87–5.11)
RDW: 13.7 % (ref 11.5–15.5)
WBC: 7 10*3/uL (ref 4.0–10.5)

## 2015-03-24 LAB — COMPREHENSIVE METABOLIC PANEL
ALT: 17 U/L (ref 14–54)
AST: 27 U/L (ref 15–41)
Albumin: 3.6 g/dL (ref 3.5–5.0)
Alkaline Phosphatase: 41 U/L (ref 38–126)
Anion gap: 10 (ref 5–15)
BUN: 15 mg/dL (ref 6–20)
CHLORIDE: 99 mmol/L — AB (ref 101–111)
CO2: 28 mmol/L (ref 22–32)
Calcium: 9.7 mg/dL (ref 8.9–10.3)
Creatinine, Ser: 1.06 mg/dL — ABNORMAL HIGH (ref 0.44–1.00)
GFR calc Af Amer: >60 mL/min (ref >60)
GFR calc non Af Amer: 59 mL/min — ABNORMAL LOW (ref >60)
GLUCOSE: 143 mg/dL — AB (ref 65–99)
POTASSIUM: 3.3 mmol/L — AB (ref 3.5–5.1)
Sodium: 137 mmol/L (ref 135–145)
Total Bilirubin: 1.1 mg/dL (ref 0.3–1.2)
Total Protein: 7.6 g/dL (ref 6.5–8.1)

## 2015-03-24 MED ORDER — SODIUM CHLORIDE 0.9 % IV SOLN
6.0000 mg/kg | Freq: Once | INTRAVENOUS | Status: AC
  Administered 2015-03-24: 500 mg via INTRAVENOUS
  Filled 2015-03-24: qty 50

## 2015-03-24 MED ORDER — SODIUM CHLORIDE 0.9 % IV SOLN
Freq: Once | INTRAVENOUS | Status: AC
  Administered 2015-03-24: 09:00:00 via INTRAVENOUS

## 2015-03-24 MED ORDER — DIPHENHYDRAMINE HCL 25 MG PO CAPS: 25.0000 mg | ORAL_CAPSULE | Freq: Once | ORAL | Status: DC

## 2015-03-24 MED ORDER — ACETAMINOPHEN 325 MG PO TABS: 650.0000 mg | ORAL_TABLET | Freq: Once | ORAL | Status: DC

## 2015-05-05 ENCOUNTER — Other Ambulatory Visit (HOSPITAL_COMMUNITY): Payer: Self-pay | Admitting: *Deleted

## 2015-05-06 ENCOUNTER — Encounter (HOSPITAL_COMMUNITY)
Admission: RE | Admit: 2015-05-06 | Discharge: 2015-05-06 | Disposition: A | Discharge status: Home / Self Care | Payer: Medicaid Other | Source: Ambulatory Visit | Attending: Rheumatology | Admitting: Rheumatology

## 2015-05-06 DIAGNOSIS — M069 Rheumatoid arthritis, unspecified: Secondary | ICD-10-CM | POA: Insufficient documentation

## 2015-05-06 LAB — CBC WITH DIFFERENTIAL/PLATELET
BASOS ABS: 0 10*3/uL (ref 0.0–0.1)
BASOS PCT: 1 %
Eosinophils Absolute: 0.2 10*3/uL (ref 0.0–0.7)
Eosinophils Relative: 3 %
HEMATOCRIT: 42.9 % (ref 36.0–46.0)
Hemoglobin: 14.3 g/dL (ref 12.0–15.0)
Lymphocytes Relative: 33 %
Lymphs Abs: 2.3 10*3/uL (ref 0.7–4.0)
MCH: 29.9 pg (ref 26.0–34.0)
MCHC: 33.3 g/dL (ref 30.0–36.0)
MCV: 89.7 fL (ref 78.0–100.0)
MONO ABS: 0.4 10*3/uL (ref 0.1–1.0)
Monocytes Relative: 5 %
NEUTROS ABS: 4.1 10*3/uL (ref 1.7–7.7)
NEUTROS PCT: 58 %
PLATELETS: 389 10*3/uL (ref 150–400)
RBC: 4.78 MIL/uL (ref 3.87–5.11)
RDW: 13.3 % (ref 11.5–15.5)
WBC: 7 10*3/uL (ref 4.0–10.5)

## 2015-05-06 LAB — COMPREHENSIVE METABOLIC PANEL
ALBUMIN: 3.3 g/dL — AB (ref 3.5–5.0)
ALT: 23 U/L (ref 14–54)
AST: 27 U/L (ref 15–41)
Alkaline Phosphatase: 44 U/L (ref 38–126)
Anion gap: 7 (ref 5–15)
BILIRUBIN TOTAL: 0.9 mg/dL (ref 0.3–1.2)
BUN: 16 mg/dL (ref 6–20)
CHLORIDE: 102 mmol/L (ref 101–111)
CO2: 29 mmol/L (ref 22–32)
Calcium: 9.5 mg/dL (ref 8.9–10.3)
Creatinine, Ser: 0.91 mg/dL (ref 0.44–1.00)
GFR calc Af Amer: >60 mL/min (ref >60)
GFR calc non Af Amer: >60 mL/min (ref >60)
GLUCOSE: 133 mg/dL — AB (ref 65–99)
POTASSIUM: 3.3 mmol/L — AB (ref 3.5–5.1)
Sodium: 138 mmol/L (ref 135–145)
Total Protein: 7.6 g/dL (ref 6.5–8.1)

## 2015-05-06 MED ORDER — SODIUM CHLORIDE 0.9 % IV SOLN
6.0000 mg/kg | INTRAVENOUS | Status: DC
  Administered 2015-05-06: 500 mg via INTRAVENOUS
  Filled 2015-05-06: qty 50

## 2015-05-06 MED ORDER — ACETAMINOPHEN 325 MG PO TABS
650.0000 mg | ORAL_TABLET | ORAL | Status: DC
  Administered 2015-05-06: 650 mg via ORAL

## 2015-05-06 MED ORDER — ACETAMINOPHEN 325 MG PO TABS
ORAL_TABLET | ORAL | Status: AC
  Filled 2015-05-06: qty 2

## 2015-05-06 MED ORDER — DIPHENHYDRAMINE HCL 25 MG PO CAPS
ORAL_CAPSULE | ORAL | Status: AC
  Filled 2015-05-06: qty 1

## 2015-05-06 MED ORDER — SODIUM CHLORIDE 0.9 % IV SOLN
INTRAVENOUS | Status: DC
  Administered 2015-05-06: 08:00:00 via INTRAVENOUS

## 2015-05-06 MED ORDER — DIPHENHYDRAMINE HCL 25 MG PO CAPS
25.0000 mg | ORAL_CAPSULE | ORAL | Status: DC
  Administered 2015-05-06: 25 mg via ORAL

## 2015-05-08 LAB — QUANTIFERON IN TUBE
QFT TB AG MINUS NIL VALUE: 0.01 [IU]/mL
QUANTIFERON NIL VALUE: 0.04 [IU]/mL
QUANTIFERON TB AG VALUE: 0.05 IU/mL
QUANTIFERON TB GOLD: NEGATIVE

## 2015-05-08 LAB — QUANTIFERON TB GOLD ASSAY (BLOOD)

## 2015-06-16 ENCOUNTER — Other Ambulatory Visit (HOSPITAL_COMMUNITY): Payer: Self-pay

## 2015-06-17 ENCOUNTER — Ambulatory Visit (HOSPITAL_COMMUNITY): Admission: RE | Admit: 2015-06-17 | Payer: Self-pay | Source: Ambulatory Visit

## 2015-07-01 ENCOUNTER — Ambulatory Visit (HOSPITAL_COMMUNITY)
Admission: RE | Admit: 2015-07-01 | Discharge: 2015-07-01 | Disposition: A | Discharge status: Home / Self Care | Payer: Medicaid Other | Source: Ambulatory Visit | Attending: Rheumatology | Admitting: Rheumatology

## 2015-07-01 DIAGNOSIS — M069 Rheumatoid arthritis, unspecified: Secondary | ICD-10-CM | POA: Diagnosis present

## 2015-07-01 LAB — CBC WITH DIFFERENTIAL/PLATELET
BASOS ABS: 0 10*3/uL (ref 0.0–0.1)
Basophils Relative: 1 %
EOS ABS: 0.3 10*3/uL (ref 0.0–0.7)
Eosinophils Relative: 6 %
HCT: 42.7 % (ref 36.0–46.0)
HEMOGLOBIN: 14.3 g/dL (ref 12.0–15.0)
LYMPHS PCT: 45 %
Lymphs Abs: 2 10*3/uL (ref 0.7–4.0)
MCH: 29.8 pg (ref 26.0–34.0)
MCHC: 33.5 g/dL (ref 30.0–36.0)
MCV: 89 fL (ref 78.0–100.0)
Monocytes Absolute: 0.4 10*3/uL (ref 0.1–1.0)
Monocytes Relative: 10 %
NEUTROS PCT: 38 %
Neutro Abs: 1.7 10*3/uL (ref 1.7–7.7)
Platelets: ADEQUATE 10*3/uL (ref 150–400)
RBC: 4.8 MIL/uL (ref 3.87–5.11)
RDW: 14 % (ref 11.5–15.5)
WBC: 4.4 10*3/uL (ref 4.0–10.5)

## 2015-07-01 LAB — COMPREHENSIVE METABOLIC PANEL
ALBUMIN: 3.6 g/dL (ref 3.5–5.0)
ALK PHOS: 43 U/L (ref 38–126)
ALT: 31 U/L (ref 14–54)
ANION GAP: 13 (ref 5–15)
AST: 45 U/L — ABNORMAL HIGH (ref 15–41)
BUN: 12 mg/dL (ref 6–20)
CALCIUM: 9.6 mg/dL (ref 8.9–10.3)
CHLORIDE: 100 mmol/L — AB (ref 101–111)
CO2: 26 mmol/L (ref 22–32)
Creatinine, Ser: 1.01 mg/dL — ABNORMAL HIGH (ref 0.44–1.00)
GFR calc Af Amer: >60 mL/min (ref >60)
GFR calc non Af Amer: >60 mL/min (ref >60)
GLUCOSE: 127 mg/dL — AB (ref 65–99)
Potassium: 4.1 mmol/L (ref 3.5–5.1)
SODIUM: 139 mmol/L (ref 135–145)
Total Bilirubin: 1 mg/dL (ref 0.3–1.2)
Total Protein: 6.8 g/dL (ref 6.5–8.1)

## 2015-07-01 MED ORDER — ACETAMINOPHEN 325 MG PO TABS
ORAL_TABLET | ORAL | Status: AC
  Filled 2015-07-01: qty 2

## 2015-07-01 MED ORDER — ACETAMINOPHEN 325 MG PO TABS
650.0000 mg | ORAL_TABLET | Freq: Once | ORAL | Status: AC
  Administered 2015-07-01: 650 mg via ORAL

## 2015-07-01 MED ORDER — DIPHENHYDRAMINE HCL 25 MG PO CAPS
25.0000 mg | ORAL_CAPSULE | Freq: Once | ORAL | Status: DC
Start: 2015-07-01 — End: 2015-07-02

## 2015-07-01 MED ORDER — ACETAMINOPHEN 325 MG PO TABS: 650.0000 mg | ORAL_TABLET | Freq: Once | ORAL | Status: DC

## 2015-07-01 MED ORDER — DIPHENHYDRAMINE HCL 25 MG PO CAPS
25.0000 mg | ORAL_CAPSULE | Freq: Once | ORAL | Status: AC
  Administered 2015-07-01: 25 mg via ORAL

## 2015-07-01 MED ORDER — DIPHENHYDRAMINE HCL 25 MG PO CAPS
ORAL_CAPSULE | ORAL | Status: AC
  Filled 2015-07-01: qty 1

## 2015-07-01 MED ORDER — SODIUM CHLORIDE 0.9 % IV SOLN: INTRAVENOUS | Status: DC

## 2015-07-01 MED ORDER — INFLIXIMAB 100 MG IV SOLR
6.0000 mg/kg | INTRAVENOUS | Status: DC
  Administered 2015-07-01: 500 mg via INTRAVENOUS
  Filled 2015-07-01: qty 50

## 2015-08-11 ENCOUNTER — Other Ambulatory Visit (HOSPITAL_COMMUNITY): Payer: Self-pay | Admitting: *Deleted

## 2015-08-12 ENCOUNTER — Encounter (HOSPITAL_COMMUNITY)
Admission: RE | Admit: 2015-08-12 | Discharge: 2015-08-12 | Disposition: A | Discharge status: Home / Self Care | Payer: Medicaid Other | Source: Ambulatory Visit | Attending: Rheumatology | Admitting: Rheumatology

## 2015-08-12 DIAGNOSIS — M069 Rheumatoid arthritis, unspecified: Secondary | ICD-10-CM | POA: Insufficient documentation

## 2015-08-12 LAB — CBC WITH DIFFERENTIAL/PLATELET
Basophils Absolute: 0 10*3/uL (ref 0.0–0.1)
Basophils Relative: 0 %
EOS ABS: 0.2 10*3/uL (ref 0.0–0.7)
EOS PCT: 5 %
HCT: 41.9 % (ref 36.0–46.0)
Hemoglobin: 13.9 g/dL (ref 12.0–15.0)
LYMPHS ABS: 1.9 10*3/uL (ref 0.7–4.0)
Lymphocytes Relative: 35 %
MCH: 30 pg (ref 26.0–34.0)
MCHC: 33.2 g/dL (ref 30.0–36.0)
MCV: 90.3 fL (ref 78.0–100.0)
Monocytes Absolute: 0.4 10*3/uL (ref 0.1–1.0)
Monocytes Relative: 7 %
Neutro Abs: 2.8 10*3/uL (ref 1.7–7.7)
Neutrophils Relative %: 53 %
PLATELETS: 327 10*3/uL (ref 150–400)
RBC: 4.64 MIL/uL (ref 3.87–5.11)
RDW: 13.8 % (ref 11.5–15.5)
WBC: 5.3 10*3/uL (ref 4.0–10.5)

## 2015-08-12 LAB — COMPREHENSIVE METABOLIC PANEL
ALT: 32 U/L (ref 14–54)
ANION GAP: 9 (ref 5–15)
AST: 25 U/L (ref 15–41)
Albumin: 3.6 g/dL (ref 3.5–5.0)
Alkaline Phosphatase: 49 U/L (ref 38–126)
BUN: 18 mg/dL (ref 6–20)
CHLORIDE: 103 mmol/L (ref 101–111)
CO2: 28 mmol/L (ref 22–32)
CREATININE: 1.01 mg/dL — AB (ref 0.44–1.00)
Calcium: 9.6 mg/dL (ref 8.9–10.3)
GFR calc non Af Amer: >60 mL/min (ref >60)
Glucose, Bld: 143 mg/dL — ABNORMAL HIGH (ref 65–99)
POTASSIUM: 4.3 mmol/L (ref 3.5–5.1)
SODIUM: 140 mmol/L (ref 135–145)
Total Bilirubin: 0.5 mg/dL (ref 0.3–1.2)
Total Protein: 7.4 g/dL (ref 6.5–8.1)

## 2015-08-12 MED ORDER — SODIUM CHLORIDE 0.9 % IV SOLN
6.0000 mg/kg | INTRAVENOUS | Status: DC
  Administered 2015-08-12: 500 mg via INTRAVENOUS
  Filled 2015-08-12: qty 50

## 2015-08-12 MED ORDER — ACETAMINOPHEN 325 MG PO TABS
ORAL_TABLET | ORAL | Status: AC
  Administered 2015-08-12: 650 mg
  Filled 2015-08-12: qty 2

## 2015-08-12 MED ORDER — ACETAMINOPHEN 325 MG PO TABS: 650.0000 mg | ORAL_TABLET | ORAL | Status: DC

## 2015-08-12 MED ORDER — DIPHENHYDRAMINE HCL 25 MG PO CAPS
ORAL_CAPSULE | ORAL | Status: AC
  Administered 2015-08-12: 25 mg
  Filled 2015-08-12: qty 1

## 2015-08-12 MED ORDER — SODIUM CHLORIDE 0.9 % IV SOLN
INTRAVENOUS | Status: DC
  Administered 2015-08-12: 09:00:00 via INTRAVENOUS

## 2015-08-12 MED ORDER — DIPHENHYDRAMINE HCL 25 MG PO CAPS: 25.0000 mg | ORAL_CAPSULE | ORAL | Status: DC

## 2015-09-22 ENCOUNTER — Other Ambulatory Visit (HOSPITAL_COMMUNITY): Payer: Self-pay | Admitting: *Deleted

## 2015-09-23 ENCOUNTER — Encounter (HOSPITAL_COMMUNITY)
Admission: RE | Admit: 2015-09-23 | Discharge: 2015-09-23 | Disposition: A | Discharge status: Home / Self Care | Payer: Medicaid Other | Source: Ambulatory Visit | Attending: Rheumatology | Admitting: Rheumatology

## 2015-09-23 DIAGNOSIS — Z91040 Latex allergy status: Secondary | ICD-10-CM | POA: Insufficient documentation

## 2015-09-23 DIAGNOSIS — Z880 Allergy status to penicillin: Secondary | ICD-10-CM | POA: Insufficient documentation

## 2015-09-23 DIAGNOSIS — M069 Rheumatoid arthritis, unspecified: Secondary | ICD-10-CM | POA: Diagnosis present

## 2015-09-23 LAB — DIFFERENTIAL
Basophils Absolute: 0.1 10*3/uL (ref 0.0–0.1)
Basophils Relative: 1 %
EOS PCT: 6 %
Eosinophils Absolute: 0.4 10*3/uL (ref 0.0–0.7)
LYMPHS ABS: 2.6 10*3/uL (ref 0.7–4.0)
LYMPHS PCT: 46 %
Monocytes Absolute: 0.6 10*3/uL (ref 0.1–1.0)
Monocytes Relative: 10 %
NEUTROS ABS: 2.2 10*3/uL (ref 1.7–7.7)
NEUTROS PCT: 37 %

## 2015-09-23 LAB — COMPREHENSIVE METABOLIC PANEL
ALBUMIN: 3.4 g/dL — AB (ref 3.5–5.0)
ALK PHOS: 51 U/L (ref 38–126)
ALT: 18 U/L (ref 14–54)
ANION GAP: 10 (ref 5–15)
AST: 24 U/L (ref 15–41)
BILIRUBIN TOTAL: 0.4 mg/dL (ref 0.3–1.2)
BUN: 18 mg/dL (ref 6–20)
CHLORIDE: 102 mmol/L (ref 101–111)
CO2: 25 mmol/L (ref 22–32)
Calcium: 9.8 mg/dL (ref 8.9–10.3)
Creatinine, Ser: 0.91 mg/dL (ref 0.44–1.00)
GFR calc Af Amer: >60 mL/min (ref >60)
GFR calc non Af Amer: >60 mL/min (ref >60)
Glucose, Bld: 143 mg/dL — ABNORMAL HIGH (ref 65–99)
POTASSIUM: 3.3 mmol/L — AB (ref 3.5–5.1)
SODIUM: 137 mmol/L (ref 135–145)
TOTAL PROTEIN: 7 g/dL (ref 6.5–8.1)

## 2015-09-23 LAB — CBC
HEMATOCRIT: 41.1 % (ref 36.0–46.0)
HEMOGLOBIN: 13.6 g/dL (ref 12.0–15.0)
MCH: 29.4 pg (ref 26.0–34.0)
MCHC: 33.1 g/dL (ref 30.0–36.0)
MCV: 88.8 fL (ref 78.0–100.0)
Platelets: 289 10*3/uL (ref 150–400)
RBC: 4.63 MIL/uL (ref 3.87–5.11)
RDW: 13.2 % (ref 11.5–15.5)
WBC: 5.8 10*3/uL (ref 4.0–10.5)

## 2015-09-23 MED ORDER — ACETAMINOPHEN 325 MG PO TABS: 650.0000 mg | ORAL_TABLET | ORAL | Status: DC

## 2015-09-23 MED ORDER — DIPHENHYDRAMINE HCL 25 MG PO CAPS: 25.0000 mg | ORAL_CAPSULE | ORAL | Status: DC

## 2015-09-23 MED ORDER — ACETAMINOPHEN 325 MG PO TABS
ORAL_TABLET | ORAL | Status: AC
  Administered 2015-09-23: 650 mg
  Filled 2015-09-23: qty 2

## 2015-09-23 MED ORDER — SODIUM CHLORIDE 0.9 % IV SOLN
INTRAVENOUS | Status: DC
  Administered 2015-09-23: 09:00:00 via INTRAVENOUS

## 2015-09-23 MED ORDER — SODIUM CHLORIDE 0.9 % IV SOLN
500.0000 mg | INTRAVENOUS | Status: DC
  Administered 2015-09-23: 500 mg via INTRAVENOUS
  Filled 2015-09-23: qty 50

## 2015-09-23 MED ORDER — DIPHENHYDRAMINE HCL 25 MG PO CAPS
ORAL_CAPSULE | ORAL | Status: AC
  Administered 2015-09-23: 25 mg
  Filled 2015-09-23: qty 1

## 2015-11-03 ENCOUNTER — Other Ambulatory Visit (HOSPITAL_COMMUNITY): Payer: Self-pay | Admitting: *Deleted

## 2015-11-04 ENCOUNTER — Encounter (HOSPITAL_COMMUNITY)
Admission: RE | Admit: 2015-11-04 | Discharge: 2015-11-04 | Disposition: A | Discharge status: Home / Self Care | Payer: Medicaid Other | Source: Ambulatory Visit | Attending: Rheumatology | Admitting: Rheumatology

## 2015-11-04 DIAGNOSIS — M069 Rheumatoid arthritis, unspecified: Secondary | ICD-10-CM | POA: Diagnosis present

## 2015-11-04 LAB — CBC WITH DIFFERENTIAL/PLATELET
BASOS ABS: 0 10*3/uL (ref 0.0–0.1)
BASOS PCT: 1 %
Eosinophils Absolute: 0.4 10*3/uL (ref 0.0–0.7)
Eosinophils Relative: 7 %
HEMATOCRIT: 41.7 % (ref 36.0–46.0)
Hemoglobin: 13.9 g/dL (ref 12.0–15.0)
LYMPHS PCT: 42 %
Lymphs Abs: 2.8 10*3/uL (ref 0.7–4.0)
MCH: 30 pg (ref 26.0–34.0)
MCHC: 33.3 g/dL (ref 30.0–36.0)
MCV: 90.1 fL (ref 78.0–100.0)
MONO ABS: 0.5 10*3/uL (ref 0.1–1.0)
Monocytes Relative: 7 %
NEUTROS ABS: 2.9 10*3/uL (ref 1.7–7.7)
Neutrophils Relative %: 43 %
PLATELETS: 385 10*3/uL (ref 150–400)
RBC: 4.63 MIL/uL (ref 3.87–5.11)
RDW: 13 % (ref 11.5–15.5)
WBC: 6.6 10*3/uL (ref 4.0–10.5)

## 2015-11-04 LAB — COMPREHENSIVE METABOLIC PANEL
ALBUMIN: 3.7 g/dL (ref 3.5–5.0)
ALT: 20 U/L (ref 14–54)
AST: 23 U/L (ref 15–41)
Alkaline Phosphatase: 57 U/L (ref 38–126)
Anion gap: 7 (ref 5–15)
BILIRUBIN TOTAL: 0.5 mg/dL (ref 0.3–1.2)
BUN: 16 mg/dL (ref 6–20)
CALCIUM: 10.3 mg/dL (ref 8.9–10.3)
CHLORIDE: 101 mmol/L (ref 101–111)
CO2: 30 mmol/L (ref 22–32)
Creatinine, Ser: 0.95 mg/dL (ref 0.44–1.00)
GFR calc Af Amer: >60 mL/min (ref >60)
GFR calc non Af Amer: >60 mL/min (ref >60)
GLUCOSE: 127 mg/dL — AB (ref 65–99)
POTASSIUM: 3.2 mmol/L — AB (ref 3.5–5.1)
Sodium: 138 mmol/L (ref 135–145)
TOTAL PROTEIN: 8.2 g/dL — AB (ref 6.5–8.1)

## 2015-11-04 MED ORDER — SODIUM CHLORIDE 0.9 % IV SOLN
500.0000 mg | INTRAVENOUS | Status: DC
  Administered 2015-11-04: 500 mg via INTRAVENOUS
  Filled 2015-11-04: qty 50

## 2015-11-04 MED ORDER — ACETAMINOPHEN 325 MG PO TABS
ORAL_TABLET | ORAL | Status: AC
  Filled 2015-11-04: qty 2

## 2015-11-04 MED ORDER — ACETAMINOPHEN 325 MG PO TABS
650.0000 mg | ORAL_TABLET | ORAL | Status: DC
  Administered 2015-11-04: 650 mg via ORAL

## 2015-11-04 MED ORDER — DIPHENHYDRAMINE HCL 25 MG PO CAPS
25.0000 mg | ORAL_CAPSULE | ORAL | Status: DC
  Administered 2015-11-04: 25 mg via ORAL

## 2015-11-04 MED ORDER — SODIUM CHLORIDE 0.9 % IV SOLN
INTRAVENOUS | Status: DC
  Administered 2015-11-04: 08:00:00 via INTRAVENOUS

## 2015-11-04 MED ORDER — DIPHENHYDRAMINE HCL 25 MG PO CAPS
ORAL_CAPSULE | ORAL | Status: AC
  Filled 2015-11-04: qty 1

## 2015-12-15 ENCOUNTER — Other Ambulatory Visit (HOSPITAL_COMMUNITY): Payer: Self-pay

## 2015-12-16 ENCOUNTER — Encounter (HOSPITAL_COMMUNITY)
Admission: RE | Admit: 2015-12-16 | Discharge: 2015-12-16 | Disposition: A | Discharge status: Home / Self Care | Payer: Medicaid Other | Source: Ambulatory Visit | Attending: Rheumatology | Admitting: Rheumatology

## 2015-12-16 DIAGNOSIS — M069 Rheumatoid arthritis, unspecified: Secondary | ICD-10-CM | POA: Insufficient documentation

## 2015-12-16 LAB — CBC WITH DIFFERENTIAL/PLATELET
BASOS ABS: 0.1 10*3/uL (ref 0.0–0.1)
Basophils Relative: 1 %
EOS ABS: 0.3 10*3/uL (ref 0.0–0.7)
EOS PCT: 6 %
HCT: 40.9 % (ref 36.0–46.0)
Hemoglobin: 13.5 g/dL (ref 12.0–15.0)
Lymphocytes Relative: 47 %
Lymphs Abs: 2.9 10*3/uL (ref 0.7–4.0)
MCH: 29.9 pg (ref 26.0–34.0)
MCHC: 33 g/dL (ref 30.0–36.0)
MCV: 90.7 fL (ref 78.0–100.0)
Monocytes Absolute: 0.3 10*3/uL (ref 0.1–1.0)
Monocytes Relative: 5 %
NEUTROS PCT: 41 %
Neutro Abs: 2.5 10*3/uL (ref 1.7–7.7)
PLATELETS: 323 10*3/uL (ref 150–400)
RBC: 4.51 MIL/uL (ref 3.87–5.11)
RDW: 13.2 % (ref 11.5–15.5)
WBC: 6 10*3/uL (ref 4.0–10.5)

## 2015-12-16 LAB — COMPREHENSIVE METABOLIC PANEL
ALBUMIN: 3.6 g/dL (ref 3.5–5.0)
ALT: 20 U/L (ref 14–54)
AST: 23 U/L (ref 15–41)
Alkaline Phosphatase: 50 U/L (ref 38–126)
Anion gap: 7 (ref 5–15)
BUN: 8 mg/dL (ref 6–20)
CHLORIDE: 100 mmol/L — AB (ref 101–111)
CO2: 29 mmol/L (ref 22–32)
CREATININE: 1.02 mg/dL — AB (ref 0.44–1.00)
Calcium: 9.8 mg/dL (ref 8.9–10.3)
GFR calc Af Amer: >60 mL/min (ref >60)
GFR calc non Af Amer: >60 mL/min (ref >60)
GLUCOSE: 139 mg/dL — AB (ref 65–99)
Potassium: 3 mmol/L — ABNORMAL LOW (ref 3.5–5.1)
SODIUM: 136 mmol/L (ref 135–145)
Total Bilirubin: 0.5 mg/dL (ref 0.3–1.2)
Total Protein: 7.1 g/dL (ref 6.5–8.1)

## 2015-12-16 MED ORDER — ACETAMINOPHEN 325 MG PO TABS: 650.0000 mg | ORAL_TABLET | ORAL | Status: DC

## 2015-12-16 MED ORDER — DIPHENHYDRAMINE HCL 25 MG PO CAPS
ORAL_CAPSULE | ORAL | Status: AC
  Administered 2015-12-16: 25 mg
  Filled 2015-12-16: qty 1

## 2015-12-16 MED ORDER — SODIUM CHLORIDE 0.9 % IV SOLN
INTRAVENOUS | Status: AC
  Administered 2015-12-16: 09:00:00 via INTRAVENOUS

## 2015-12-16 MED ORDER — INFLIXIMAB 100 MG IV SOLR
500.0000 mg | INTRAVENOUS | Status: AC
  Administered 2015-12-16: 500 mg via INTRAVENOUS
  Filled 2015-12-16: qty 30

## 2015-12-16 MED ORDER — DIPHENHYDRAMINE HCL 25 MG PO CAPS: 25.0000 mg | ORAL_CAPSULE | ORAL | Status: DC

## 2015-12-16 MED ORDER — ACETAMINOPHEN 325 MG PO TABS
ORAL_TABLET | ORAL | Status: AC
  Administered 2015-12-16: 650 mg
  Filled 2015-12-16: qty 2

## 2016-01-03 ENCOUNTER — Encounter: Payer: Self-pay | Admitting: Radiology

## 2016-01-03 DIAGNOSIS — M0579 Rheumatoid arthritis with rheumatoid factor of multiple sites without organ or systems involvement: Secondary | ICD-10-CM

## 2016-01-26 ENCOUNTER — Other Ambulatory Visit (HOSPITAL_COMMUNITY): Payer: Self-pay | Admitting: *Deleted

## 2016-01-27 ENCOUNTER — Encounter (HOSPITAL_COMMUNITY)
Admission: RE | Admit: 2016-01-27 | Discharge: 2016-01-27 | Disposition: A | Discharge status: Home / Self Care | Payer: Medicaid Other | Source: Ambulatory Visit | Attending: Rheumatology | Admitting: Rheumatology

## 2016-01-27 DIAGNOSIS — M069 Rheumatoid arthritis, unspecified: Secondary | ICD-10-CM | POA: Diagnosis present

## 2016-01-27 LAB — CBC WITH DIFFERENTIAL/PLATELET
BASOS ABS: 0 10*3/uL (ref 0.0–0.1)
BASOS PCT: 1 %
EOS ABS: 0.5 10*3/uL (ref 0.0–0.7)
Eosinophils Relative: 7 %
HCT: 41.3 % (ref 36.0–46.0)
Hemoglobin: 13.5 g/dL (ref 12.0–15.0)
LYMPHS ABS: 2.9 10*3/uL (ref 0.7–4.0)
LYMPHS PCT: 43 %
MCH: 29.8 pg (ref 26.0–34.0)
MCHC: 32.7 g/dL (ref 30.0–36.0)
MCV: 91.2 fL (ref 78.0–100.0)
MONOS PCT: 8 %
Monocytes Absolute: 0.5 10*3/uL (ref 0.1–1.0)
NEUTROS PCT: 41 %
Neutro Abs: 2.7 10*3/uL (ref 1.7–7.7)
PLATELETS: 411 10*3/uL — AB (ref 150–400)
RBC: 4.53 MIL/uL (ref 3.87–5.11)
RDW: 13.6 % (ref 11.5–15.5)
WBC: 6.7 10*3/uL (ref 4.0–10.5)

## 2016-01-27 LAB — COMPREHENSIVE METABOLIC PANEL
ALT: 15 U/L (ref 14–54)
AST: 21 U/L (ref 15–41)
Albumin: 3.6 g/dL (ref 3.5–5.0)
Alkaline Phosphatase: 56 U/L (ref 38–126)
Anion gap: 6 (ref 5–15)
BUN: 10 mg/dL (ref 6–20)
CALCIUM: 9.5 mg/dL (ref 8.9–10.3)
CO2: 32 mmol/L (ref 22–32)
CREATININE: 0.94 mg/dL (ref 0.44–1.00)
Chloride: 100 mmol/L — ABNORMAL LOW (ref 101–111)
GLUCOSE: 139 mg/dL — AB (ref 65–99)
Potassium: 3.4 mmol/L — ABNORMAL LOW (ref 3.5–5.1)
Sodium: 138 mmol/L (ref 135–145)
TOTAL PROTEIN: 7.3 g/dL (ref 6.5–8.1)
Total Bilirubin: 0.5 mg/dL (ref 0.3–1.2)

## 2016-01-27 MED ORDER — SODIUM CHLORIDE 0.9 % IV SOLN
6.0000 mg/kg | INTRAVENOUS | Status: DC
  Administered 2016-01-27: 500 mg via INTRAVENOUS
  Filled 2016-01-27: qty 50

## 2016-01-27 MED ORDER — ACETAMINOPHEN 325 MG PO TABS
ORAL_TABLET | ORAL | Status: AC
  Filled 2016-01-27: qty 2

## 2016-01-27 MED ORDER — DIPHENHYDRAMINE HCL 25 MG PO CAPS
ORAL_CAPSULE | ORAL | Status: AC
  Filled 2016-01-27: qty 1

## 2016-01-27 MED ORDER — SODIUM CHLORIDE 0.9 % IV SOLN
INTRAVENOUS | Status: AC
  Administered 2016-01-27: 08:00:00 via INTRAVENOUS

## 2016-01-27 MED ORDER — ACETAMINOPHEN 325 MG PO TABS
650.0000 mg | ORAL_TABLET | ORAL | Status: DC
  Administered 2016-01-27: 650 mg via ORAL

## 2016-01-27 MED ORDER — DIPHENHYDRAMINE HCL 25 MG PO CAPS
25.0000 mg | ORAL_CAPSULE | ORAL | Status: DC
Start: 2016-01-27 — End: 2016-01-28
  Administered 2016-01-27: 25 mg via ORAL

## 2016-02-27 ENCOUNTER — Other Ambulatory Visit: Payer: Self-pay | Admitting: Radiology

## 2016-02-27 DIAGNOSIS — M0579 Rheumatoid arthritis with rheumatoid factor of multiple sites without organ or systems involvement: Secondary | ICD-10-CM

## 2016-02-27 DIAGNOSIS — Z79899 Other long term (current) drug therapy: Secondary | ICD-10-CM

## 2016-02-28 ENCOUNTER — Other Ambulatory Visit: Payer: Self-pay | Admitting: Radiology

## 2016-03-09 ENCOUNTER — Encounter (HOSPITAL_COMMUNITY)
Admission: RE | Admit: 2016-03-09 | Discharge: 2016-03-09 | Disposition: A | Discharge status: Home / Self Care | Payer: Medicare Other | Source: Ambulatory Visit | Attending: Rheumatology | Admitting: Rheumatology

## 2016-03-09 DIAGNOSIS — Z79899 Other long term (current) drug therapy: Secondary | ICD-10-CM

## 2016-03-09 DIAGNOSIS — M0579 Rheumatoid arthritis with rheumatoid factor of multiple sites without organ or systems involvement: Secondary | ICD-10-CM

## 2016-03-09 LAB — COMPREHENSIVE METABOLIC PANEL
ALBUMIN: 3.4 g/dL — AB (ref 3.5–5.0)
ALT: 18 U/L (ref 14–54)
AST: 24 U/L (ref 15–41)
Alkaline Phosphatase: 50 U/L (ref 38–126)
Anion gap: 8 (ref 5–15)
BUN: 11 mg/dL (ref 6–20)
CHLORIDE: 101 mmol/L (ref 101–111)
CO2: 28 mmol/L (ref 22–32)
Calcium: 9.3 mg/dL (ref 8.9–10.3)
Creatinine, Ser: 0.94 mg/dL (ref 0.44–1.00)
GFR calc Af Amer: >60 mL/min (ref >60)
Glucose, Bld: 135 mg/dL — ABNORMAL HIGH (ref 65–99)
POTASSIUM: 3.3 mmol/L — AB (ref 3.5–5.1)
SODIUM: 137 mmol/L (ref 135–145)
Total Bilirubin: 0.5 mg/dL (ref 0.3–1.2)
Total Protein: 6.9 g/dL (ref 6.5–8.1)

## 2016-03-09 LAB — CBC WITH DIFFERENTIAL/PLATELET
BASOS ABS: 0.1 10*3/uL (ref 0.0–0.1)
BASOS PCT: 1 %
EOS ABS: 0.3 10*3/uL (ref 0.0–0.7)
EOS PCT: 5 %
HCT: 42 % (ref 36.0–46.0)
Hemoglobin: 14.2 g/dL (ref 12.0–15.0)
Lymphocytes Relative: 54 %
Lymphs Abs: 3 10*3/uL (ref 0.7–4.0)
MCH: 30.3 pg (ref 26.0–34.0)
MCHC: 33.8 g/dL (ref 30.0–36.0)
MCV: 89.7 fL (ref 78.0–100.0)
MONO ABS: 0.4 10*3/uL (ref 0.1–1.0)
Monocytes Relative: 6 %
Neutro Abs: 1.8 10*3/uL (ref 1.7–7.7)
Neutrophils Relative %: 34 %
PLATELETS: 331 10*3/uL (ref 150–400)
RBC: 4.68 MIL/uL (ref 3.87–5.11)
RDW: 13.5 % (ref 11.5–15.5)
WBC: 5.5 10*3/uL (ref 4.0–10.5)

## 2016-03-09 MED ORDER — ACETAMINOPHEN 325 MG PO TABS
ORAL_TABLET | ORAL | Status: AC
  Filled 2016-03-09: qty 2

## 2016-03-09 MED ORDER — DIPHENHYDRAMINE HCL 25 MG PO CAPS: 25.0000 mg | ORAL_CAPSULE | Freq: Once | ORAL | Status: DC

## 2016-03-09 MED ORDER — ACETAMINOPHEN 325 MG PO TABS
650.0000 mg | ORAL_TABLET | Freq: Once | ORAL | Status: AC
  Administered 2016-03-09: 650 mg via ORAL

## 2016-03-09 MED ORDER — SODIUM CHLORIDE 0.9 % IV SOLN
6.0000 mg/kg | INTRAVENOUS | Status: DC
  Administered 2016-03-09: 500 mg via INTRAVENOUS
  Filled 2016-03-09: qty 50

## 2016-03-22 ENCOUNTER — Encounter: Payer: Self-pay | Admitting: *Deleted

## 2016-03-22 DIAGNOSIS — H15009 Unspecified scleritis, unspecified eye: Secondary | ICD-10-CM | POA: Insufficient documentation

## 2016-03-22 DIAGNOSIS — M51369 Other intervertebral disc degeneration, lumbar region without mention of lumbar back pain or lower extremity pain: Secondary | ICD-10-CM | POA: Insufficient documentation

## 2016-03-22 DIAGNOSIS — M5030 Other cervical disc degeneration, unspecified cervical region: Secondary | ICD-10-CM

## 2016-03-22 DIAGNOSIS — M5136 Other intervertebral disc degeneration, lumbar region: Secondary | ICD-10-CM

## 2016-03-22 HISTORY — DX: Unspecified scleritis, unspecified eye: H15.009

## 2016-03-22 HISTORY — DX: Other intervertebral disc degeneration, lumbar region without mention of lumbar back pain or lower extremity pain: M51.369

## 2016-03-22 HISTORY — DX: Other cervical disc degeneration, unspecified cervical region: M50.30

## 2016-03-22 HISTORY — DX: Other intervertebral disc degeneration, lumbar region: M51.36

## 2016-03-23 NOTE — Progress Notes (Signed)
Office Visit Note  Patient: Wendy Woods             Date of Birth: 06-30-1960           MRN: [id number]             PCP: Dorrene German, MD Referring: Fleet Contras, MD Visit Date: 03/27/2016 Occupation: @GUAROCC @    Subjective:  Left shoulder pain  History of Present Illness: Wendy Woods is a 55 y.o. female with rheumatoid arthritis. She states she's been having discomfort in her left shoulder. She states she has had problems with left rotator cuff in the past. She was recently lifting groceries and felt that her left shoulder joint was strained again. She is also having some discomfort in her left middle finger, left wrist joint and bilateral knee joints. She denies any joint swelling. Her last Remicade infusion was on 03/09/2016. She is tolerating her medications well.  Activities of Daily Living:  Patient reports morning stiffness for 5-10 minutes.   Patient Reports nocturnal pain.  Difficulty dressing/grooming: Reports Difficulty climbing stairs: Denies Difficulty getting out of chair: Denies Difficulty using hands for taps, buttons, cutlery, and/or writing: Reports   Review of Systems  Constitutional: Positive for fatigue and weakness. Negative for night sweats, weight gain and weight loss.  HENT: Positive for mouth dryness. Negative for mouth sores, trouble swallowing, trouble swallowing and nose dryness.   Eyes: Positive for dryness. Negative for pain, redness and visual disturbance.  Respiratory: Negative for cough, shortness of breath and difficulty breathing.   Cardiovascular: Negative for chest pain, palpitations, hypertension, irregular heartbeat and swelling in legs/feet.  Gastrointestinal: Negative for blood in stool, constipation and diarrhea.  Endocrine: Negative for increased urination.  Genitourinary: Negative for vaginal dryness.  Musculoskeletal: Positive for arthralgias, joint pain and morning stiffness. Negative for joint swelling, myalgias, muscle  weakness, muscle tenderness and myalgias.  Skin: Negative for color change, rash, hair loss, skin tightness, ulcers and sensitivity to sunlight.  Allergic/Immunologic: Negative for susceptible to infections.  Neurological: Negative for dizziness, memory loss and night sweats.  Hematological: Negative for swollen glands.  Psychiatric/Behavioral: Negative for depressed mood and sleep disturbance. The patient is nervous/anxious.     PMFS History:  Patient Active Problem List   Diagnosis Date Noted  . DDD (degenerative disc disease), cervical 03/22/2016  . DDD (degenerative disc disease), lumbar 03/22/2016  . 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 Angioedema 07/15/2014  . Hypokalemia 07/15/2014  . Diabetes mellitus, controlled (HCC) 07/03/2012  . HTN (hypertension) 07/03/2012  . Rheumatoid arthritis (HCC) 07/03/2012  . Current chronic use of systemic steroids 07/03/2012  . 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 DDD (degenerative disc disease), cervical 03/22/2016  . DDD (degenerative disc disease), lumbar 03/22/2016  . GERD (gastroesophageal reflux disease)   . Hyperlipidemia   . Hypertension   . Rheumatoid arthritis(714.0)   . 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 Type II diabetes mellitus (HCC)     Family History  Problem Relation Age of Onset  . Colon polyps Father   . Prostate cancer Father   . Colon cancer Neg Hx    Past Surgical History:  Procedure Laterality Date  . APPENDECTOMY  1968  . CATARACT EXTRACTION W/ INTRAOCULAR LENS  IMPLANT, BILATERAL Bilateral 11/2013-12/2013  . KNEE ARTHROSCOPY Left 1980's-1990's X 3  . MYOMECTOMY  1990's   Social History   Social History Narrative  . No narrative on file     Objective:  Vital Signs: BP 137/87 (BP Location: Left Arm, Patient Position: Sitting, Cuff Size: Large)   Pulse 64   Resp 14   Ht 5\' 4"  (1.626 m)   Wt 172 lb (78 kg)   BMI 29.52 kg/m    Physical Exam  Constitutional: She is oriented to  person, place, and time. She appears well-developed and well-nourished.  HENT:  Head: Normocephalic and atraumatic.  Eyes: Conjunctivae and EOM are normal.  Neck: Normal range of motion.  Cardiovascular: Normal rate, regular rhythm, normal heart sounds and intact distal pulses.   Pulmonary/Chest: Effort normal and breath sounds normal.  Abdominal: Soft. Bowel sounds are normal.  Lymphadenopathy:    She has no cervical adenopathy.  Neurological: She is alert and oriented to person, place, and time.  Skin: Skin is warm and dry. Capillary refill takes less than 2 seconds.  Psychiatric: She has a normal mood and affect. Her behavior is normal.  Nursing note and vitals reviewed.    Musculoskeletal Exam: C-spine, thoracic, lumbar spine good range of motion. No SI joint tenderness. Right shoulder joint was good range of motion. Left shoulder joints she has painful range of motion. Her abduction was limited to 50. Elbow joints good range of motion she has tenderness over bilateral wrist joints with synovial thickening over her left wrist joint. She has tenderness on palpation over her left third digit DIP which is subluxed but no synovitis was noted. She is good range of motion of bilateral hip joints knee joints ankles with no synovitis.  CDAI Exam: CDAI Homunculus Exam:   Tenderness:  LUE: wrist Left hand: 3rd MCP  Joint Counts:  CDAI Tender Joint count: 2 CDAI Swollen Joint count: 0  Global Assessments:  Patient Global Assessment: 8 Provider Global Assessment: 6  CDAI Calculated Score: 16    Investigation: Findings:  January 2017 TB negative, generally 2012 chest x-ray normal, 01/27/2016 CMP glucose 139, CBC normal    Imaging: No results found.  Speciality Comments: No specialty comments available.    Procedures:  Large Joint Inj Date/Time: 03/27/2016 9:29 AM Performed by: Pollyann Savoy Authorized by: Pollyann Savoy   Consent Given by:  Patient Site marked:  the procedure site was marked   Timeout: prior to procedure the correct patient, procedure, and site was verified   Indications:  Pain Location:  Shoulder Site:  L glenohumeral Prep: patient was prepped and draped in usual sterile fashion   Needle Size:  27 G Needle Length:  1.5 inches Approach:  Posterior Ultrasound Guidance: No   Fluoroscopic Guidance: No   Arthrogram: No   Medications:  1 mL lidocaine 1 %; 30 mg triamcinolone acetonide 40 MG/ML Aspiration Attempted: Yes   Aspirate amount (mL):  0 Patient tolerance:  Patient tolerated the procedure well with no immediate complications   Allergies: Latex; Lisinopril; and Penicillins   Assessment / Plan:     Visit Diagnoses: Rheumatoid arthritis - RF positive, CCP positive, ANA negative. She's doing much better on Remicade infusions and methotrexate combination. She has some joint tenderness but not much synovitis. For the left third digit DIP subluxation and discomfort I will refer her to physical therapy for her wearing the splint. I'll also obtain x-ray of her bilateral hands today. Her x-ray showed mild changes consistent with thumb inflammatory arthritis. She also had left third digit DIP subluxation.  Left shoulder joint tendinopathy and discomfort: She is painful limited range of motion of her left shoulder after informed consent was obtained the left shoulder joint  was injected with cortisone and lidocaine as described above which she tolerated well. She reports some immediate relief after the injection. I obtain x-ray of her left shoulder joint today which was unremarkable except for type II acromion I will refer her to physical therapy for left shoulder joint tendinopathy.  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 Patient denies any recent episodes of scleritis.  High risk medication use - Remicade 6 mg/kg every 6 weeks, Otrexup 20 mg every week. Her labs have been stable.  Current chronic use of systemic  steroids in the past she is off prednisone and doing well.  DDD (degenerative disc disease), cervical  DDD (degenerative disc disease), lumbar  Trochanteric bursitis, left hip  Diabetes mellitus: Have advised her to monitor her blood sugars closely.  Essential hypertension: Advised her to monitor blood pressure closely after the cortisone injection.   Orders: Orders Placed This Encounter  Procedures  . Large Joint Injection/Arthrocentesis  . XR Shoulder Left  . XR Hand 2 View Right  . XR Hand 2 View Left   No orders of the defined types were placed in this encounter.   Face-to-face time spent with patient was 30 minutes. 50% of time was spent in counseling and coordination of care.  Follow-Up Instructions: Return in about 4 months (around 07/25/2016) for Rheumatoid arthritis.   Pollyann Savoy, MD

## 2016-03-27 ENCOUNTER — Ambulatory Visit (INDEPENDENT_AMBULATORY_CARE_PROVIDER_SITE_OTHER): Payer: Medicare Other

## 2016-03-27 ENCOUNTER — Ambulatory Visit (INDEPENDENT_AMBULATORY_CARE_PROVIDER_SITE_OTHER): Payer: Medicare Other | Admitting: Rheumatology

## 2016-03-27 ENCOUNTER — Telehealth: Payer: Self-pay | Admitting: Radiology

## 2016-03-27 ENCOUNTER — Encounter: Payer: Self-pay | Admitting: Rheumatology

## 2016-03-27 VITALS — BP 137/87 | HR 64 | Resp 14 | Ht 64.0 in | Wt 172.0 lb

## 2016-03-27 DIAGNOSIS — M0579 Rheumatoid arthritis with rheumatoid factor of multiple sites without organ or systems involvement: Secondary | ICD-10-CM | POA: Diagnosis not present

## 2016-03-27 DIAGNOSIS — M67912 Unspecified disorder of synovium and tendon, left shoulder: Secondary | ICD-10-CM | POA: Diagnosis not present

## 2016-03-27 DIAGNOSIS — M7062 Trochanteric bursitis, left hip: Secondary | ICD-10-CM | POA: Diagnosis not present

## 2016-03-27 DIAGNOSIS — E119 Type 2 diabetes mellitus without complications: Secondary | ICD-10-CM | POA: Diagnosis not present

## 2016-03-27 DIAGNOSIS — M25512 Pain in left shoulder: Secondary | ICD-10-CM

## 2016-03-27 DIAGNOSIS — M79642 Pain in left hand: Secondary | ICD-10-CM

## 2016-03-27 DIAGNOSIS — M5136 Other intervertebral disc degeneration, lumbar region: Secondary | ICD-10-CM | POA: Diagnosis not present

## 2016-03-27 DIAGNOSIS — Z79899 Other long term (current) drug therapy: Secondary | ICD-10-CM

## 2016-03-27 DIAGNOSIS — G8929 Other chronic pain: Secondary | ICD-10-CM | POA: Diagnosis not present

## 2016-03-27 DIAGNOSIS — Z7952 Long term (current) use of systemic steroids: Secondary | ICD-10-CM | POA: Diagnosis not present

## 2016-03-27 DIAGNOSIS — I10 Essential (primary) hypertension: Secondary | ICD-10-CM

## 2016-03-27 DIAGNOSIS — M5030 Other cervical disc degeneration, unspecified cervical region: Secondary | ICD-10-CM

## 2016-03-27 DIAGNOSIS — M79641 Pain in right hand: Secondary | ICD-10-CM

## 2016-03-27 DIAGNOSIS — H15009 Unspecified scleritis, unspecified eye: Secondary | ICD-10-CM

## 2016-03-27 MED ORDER — TRIAMCINOLONE ACETONIDE 40 MG/ML IJ SUSP
30.0000 mg | INTRAMUSCULAR | Status: AC | PRN
  Administered 2016-03-27: 30 mg via INTRA_ARTICULAR

## 2016-03-27 MED ORDER — LIDOCAINE HCL 1 % IJ SOLN
1.0000 mL | INTRAMUSCULAR | Status: AC | PRN
  Administered 2016-03-27: 1 mL

## 2016-03-27 NOTE — Progress Notes (Signed)
Rheumatology Medication Review by a Pharmacist Does the patient feel that his/her medications are working for him/her?  Yes Has the patient been experiencing any side effects to the medications prescribed?  No Does the patient have any problems obtaining medications?  No  Issues to address at subsequent visits: None   Pharmacist comments:  Wendy Woods is a pleasant 55 yo F who presents for follow up of her rheumatoid arthritis (positive rheumatoid factor, positive CCP).  Patient had most recent Remicade infusion on 03/09/16.  She had standing labs done at that time.  Most recent TB Gold was in January 2017.  She will be due for TB Gold again in January 2018.  Patient denied any medication related questions at this time.    Wendy Woods, Pharm.D., BCPS Clinical Pharmacist Pager: [phone] Phone: [phone] 03/27/2016 8:59 AM

## 2016-03-27 NOTE — Telephone Encounter (Signed)
Discussed Xrays with Dr Deveshwar/ my chart message sent.

## 2016-04-20 ENCOUNTER — Other Ambulatory Visit: Payer: Self-pay | Admitting: *Deleted

## 2016-04-20 ENCOUNTER — Encounter (HOSPITAL_COMMUNITY)
Admission: RE | Admit: 2016-04-20 | Discharge: 2016-04-20 | Disposition: A | Discharge status: Home / Self Care | Payer: Medicare Other | Source: Ambulatory Visit | Attending: Rheumatology | Admitting: Rheumatology

## 2016-04-20 DIAGNOSIS — M0579 Rheumatoid arthritis with rheumatoid factor of multiple sites without organ or systems involvement: Secondary | ICD-10-CM | POA: Insufficient documentation

## 2016-04-20 LAB — CBC WITH DIFFERENTIAL/PLATELET
BASOS PCT: 1 %
Basophils Absolute: 0 10*3/uL (ref 0.0–0.1)
EOS ABS: 0.2 10*3/uL (ref 0.0–0.7)
Eosinophils Relative: 2 %
HCT: 44.3 % (ref 36.0–46.0)
HEMOGLOBIN: 14.8 g/dL (ref 12.0–15.0)
Lymphocytes Relative: 53 %
Lymphs Abs: 3.7 10*3/uL (ref 0.7–4.0)
MCH: 29.9 pg (ref 26.0–34.0)
MCHC: 33.4 g/dL (ref 30.0–36.0)
MCV: 89.5 fL (ref 78.0–100.0)
MONO ABS: 0.4 10*3/uL (ref 0.1–1.0)
MONOS PCT: 5 %
NEUTROS PCT: 39 %
Neutro Abs: 2.7 10*3/uL (ref 1.7–7.7)
PLATELETS: 302 10*3/uL (ref 150–400)
RBC: 4.95 MIL/uL (ref 3.87–5.11)
RDW: 13.1 % (ref 11.5–15.5)
WBC: 7 10*3/uL (ref 4.0–10.5)

## 2016-04-20 LAB — COMPREHENSIVE METABOLIC PANEL
ALBUMIN: 3.7 g/dL (ref 3.5–5.0)
ALT: 19 U/L (ref 14–54)
ANION GAP: 9 (ref 5–15)
AST: 22 U/L (ref 15–41)
Alkaline Phosphatase: 64 U/L (ref 38–126)
BUN: 16 mg/dL (ref 6–20)
CHLORIDE: 99 mmol/L — AB (ref 101–111)
CO2: 30 mmol/L (ref 22–32)
Calcium: 10 mg/dL (ref 8.9–10.3)
Creatinine, Ser: 1.2 mg/dL — ABNORMAL HIGH (ref 0.44–1.00)
GFR calc Af Amer: 58 mL/min — ABNORMAL LOW (ref >60)
GFR calc non Af Amer: 50 mL/min — ABNORMAL LOW (ref >60)
GLUCOSE: 142 mg/dL — AB (ref 65–99)
POTASSIUM: 3.5 mmol/L (ref 3.5–5.1)
SODIUM: 138 mmol/L (ref 135–145)
Total Bilirubin: 0.6 mg/dL (ref 0.3–1.2)
Total Protein: 7.8 g/dL (ref 6.5–8.1)

## 2016-04-20 MED ORDER — METHOTREXATE (PF) 15 MG/0.4ML ~~LOC~~ SOAJ: 15.0000 mg | SUBCUTANEOUS | 2 refills | Status: DC

## 2016-04-20 MED ORDER — ACETAMINOPHEN 325 MG PO TABS
ORAL_TABLET | ORAL | Status: AC
  Administered 2016-04-20: 650 mg
  Filled 2016-04-20: qty 2

## 2016-04-20 MED ORDER — DIPHENHYDRAMINE HCL 25 MG PO CAPS: 25.0000 mg | ORAL_CAPSULE | ORAL | Status: DC

## 2016-04-20 MED ORDER — ACETAMINOPHEN 325 MG PO TABS: 650.0000 mg | ORAL_TABLET | ORAL | Status: DC

## 2016-04-20 MED ORDER — SODIUM CHLORIDE 0.9 % IV SOLN
6.0000 mg/kg | INTRAVENOUS | Status: DC
  Administered 2016-04-20: 500 mg via INTRAVENOUS
  Filled 2016-04-20: qty 50

## 2016-04-20 MED ORDER — DIPHENHYDRAMINE HCL 25 MG PO CAPS
ORAL_CAPSULE | ORAL | Status: AC
  Administered 2016-04-20: 25 mg
  Filled 2016-04-20: qty 1

## 2016-04-20 NOTE — Progress Notes (Signed)
Please ask patient to decrease of trucks up to 15 mg every every week. Repeat BMP with GFR in one month

## 2016-05-04 ENCOUNTER — Telehealth: Payer: Self-pay | Admitting: Pharmacist

## 2016-05-04 NOTE — Telephone Encounter (Signed)
I called patient to discuss.  Left message asking her to call me back.

## 2016-05-04 NOTE — Telephone Encounter (Signed)
Received a letter from [facility] stating patient was given a temporary supply of Otrexup but this medication is no longer on patient's insurance formulary.    Reviewed patient's chart and noted she was switched from oral methotrexate to [facility] in January 2015.    Also noted that following the most recent lab results decision was made to reduce Otrexup from 20 mg to 15 mg based on her renal function.  Chart reviewed and noted that patient has been taking Otrexup 10 mg weekly.  I spoke to patient who confirmed that she has been on Otrexup 10 mg.  She states she has not started the 15 mg dose at this time.    Dr. Corliss Skains, please advise what dose of methotrexate you would like for her to be on.

## 2016-05-04 NOTE — Telephone Encounter (Signed)
Her Cr is higher. Please, rx MTX 10mg  q wk.

## 2016-05-04 NOTE — Telephone Encounter (Signed)
Patient returned Rachel's call, please call back at [phone]. If patient doesn't answer, please leave a message

## 2016-05-04 NOTE — Telephone Encounter (Addendum)
Decrease methotrexate to 7.5 mg SQ every week. Patient may, in office for nurse visit for education.

## 2016-05-04 NOTE — Telephone Encounter (Signed)
MTX 10mg  weekly is what she was on when her Cr increased, do you want to reduce dose?  Patient would need to switch from Otrexup to MTX vials in order to reduce dose.

## 2016-05-04 NOTE — Telephone Encounter (Signed)
Called patient back.  She did not answer.  I left a message informing her that Dr. Corliss Skains wants her to reduce the dose to 7.5 mg SQ weekly.  Advised her that the pens do not come in that strength and she will need to switch to vials and syringe.  Advised patient we can educate her on how to use vial and syringe in the office.  Asked patient to call me back to discuss further.

## 2016-05-05 MED ORDER — METHOTREXATE SODIUM CHEMO INJECTION 50 MG/2ML: 7.5000 mg | INTRAMUSCULAR | 2 refills | Status: DC

## 2016-05-05 MED ORDER — "TUBERCULIN-ALLERGY SYRINGES 27G X 1/2"" 1 ML KIT": 1.0000 | PACK | 0 refills | Status: DC

## 2016-05-05 NOTE — Telephone Encounter (Signed)
Called and spoke to patient.  Reviewed with patient that Dr. Corliss Skains wants patient to reduce methotrexate dose to 7.5 mg (0.3 mL) SQ weekly.  Reviewed that Otrexup is not available in this dose and we will have to switch her to methotrexate vial and syringe.  Discussed having patient come to the office for education on vial and syringe.  Patient reports she has used vial and syringe before.  Reviewed with patient the appropriate storage of methotrexate and injection instructions.  Advised patient to call if she has any questions.  Patient voiced understanding.  Advised patient to continue taking folic acid daily.     Lilla Shook, Pharm.D., BCPS Clinical Pharmacist Pager: [phone] Phone: [phone] 05/05/2016 8:23 AM

## 2016-05-25 ENCOUNTER — Other Ambulatory Visit: Payer: Self-pay | Admitting: Radiology

## 2016-06-01 ENCOUNTER — Encounter (HOSPITAL_COMMUNITY)
Admission: RE | Admit: 2016-06-01 | Discharge: 2016-06-01 | Disposition: A | Discharge status: Home / Self Care | Payer: Medicare Other | Source: Ambulatory Visit | Attending: Rheumatology | Admitting: Rheumatology

## 2016-06-01 DIAGNOSIS — M0579 Rheumatoid arthritis with rheumatoid factor of multiple sites without organ or systems involvement: Secondary | ICD-10-CM | POA: Insufficient documentation

## 2016-06-01 MED ORDER — DIPHENHYDRAMINE HCL 25 MG PO CAPS
ORAL_CAPSULE | ORAL | Status: AC
  Filled 2016-06-01: qty 1

## 2016-06-01 MED ORDER — ACETAMINOPHEN 325 MG PO TABS
650.0000 mg | ORAL_TABLET | ORAL | Status: DC
  Administered 2016-06-01: 650 mg via ORAL

## 2016-06-01 MED ORDER — SODIUM CHLORIDE 0.9 % IV SOLN
INTRAVENOUS | Status: DC
  Administered 2016-06-01: 08:00:00 via INTRAVENOUS

## 2016-06-01 MED ORDER — ACETAMINOPHEN 325 MG PO TABS
ORAL_TABLET | ORAL | Status: AC
  Filled 2016-06-01: qty 2

## 2016-06-01 MED ORDER — DIPHENHYDRAMINE HCL 25 MG PO TABS
25.0000 mg | ORAL_TABLET | Freq: Once | ORAL | Status: AC
  Administered 2016-06-01: 25 mg via ORAL
  Filled 2016-06-01: qty 1

## 2016-06-01 MED ORDER — SODIUM CHLORIDE 0.9 % IV SOLN
6.0000 mg/kg | INTRAVENOUS | Status: AC
  Administered 2016-06-01: 500 mg via INTRAVENOUS
  Filled 2016-06-01: qty 50

## 2016-06-16 ENCOUNTER — Other Ambulatory Visit: Payer: Self-pay | Admitting: Internal Medicine

## 2016-06-16 DIAGNOSIS — Z1231 Encounter for screening mammogram for malignant neoplasm of breast: Secondary | ICD-10-CM

## 2016-06-16 DIAGNOSIS — E2839 Other primary ovarian failure: Secondary | ICD-10-CM

## 2016-06-27 ENCOUNTER — Other Ambulatory Visit: Payer: Self-pay | Admitting: Radiology

## 2016-06-27 DIAGNOSIS — M0579 Rheumatoid arthritis with rheumatoid factor of multiple sites without organ or systems involvement: Secondary | ICD-10-CM

## 2016-06-27 NOTE — Progress Notes (Signed)
Orders placed for CBC CMP every other infusion / tylenol benadryl 60 minutes with each infusion, and Remicade 6mg / kg

## 2016-07-04 ENCOUNTER — Ambulatory Visit
Admission: RE | Admit: 2016-07-04 | Discharge: 2016-07-04 | Disposition: A | Discharge status: Home / Self Care | Payer: Medicare Other | Source: Ambulatory Visit | Attending: Internal Medicine | Admitting: Internal Medicine

## 2016-07-04 DIAGNOSIS — E2839 Other primary ovarian failure: Secondary | ICD-10-CM

## 2016-07-04 DIAGNOSIS — Z1231 Encounter for screening mammogram for malignant neoplasm of breast: Secondary | ICD-10-CM

## 2016-07-12 ENCOUNTER — Other Ambulatory Visit (HOSPITAL_COMMUNITY): Payer: Self-pay | Admitting: *Deleted

## 2016-07-13 ENCOUNTER — Encounter (HOSPITAL_COMMUNITY)
Admission: RE | Admit: 2016-07-13 | Discharge: 2016-07-13 | Disposition: A | Discharge status: Home / Self Care | Payer: Medicare Other | Source: Ambulatory Visit | Attending: Rheumatology | Admitting: Rheumatology

## 2016-07-13 DIAGNOSIS — M0579 Rheumatoid arthritis with rheumatoid factor of multiple sites without organ or systems involvement: Secondary | ICD-10-CM | POA: Insufficient documentation

## 2016-07-13 LAB — CBC WITH DIFFERENTIAL/PLATELET
BASOS PCT: 1 %
Basophils Absolute: 0.1 10*3/uL (ref 0.0–0.1)
EOS PCT: 4 %
Eosinophils Absolute: 0.3 10*3/uL (ref 0.0–0.7)
HEMATOCRIT: 40.9 % (ref 36.0–46.0)
HEMOGLOBIN: 14.1 g/dL (ref 12.0–15.0)
LYMPHS PCT: 47 %
Lymphs Abs: 2.9 10*3/uL (ref 0.7–4.0)
MCH: 30.5 pg (ref 26.0–34.0)
MCHC: 34.5 g/dL (ref 30.0–36.0)
MCV: 88.5 fL (ref 78.0–100.0)
MONOS PCT: 8 %
Monocytes Absolute: 0.5 10*3/uL (ref 0.1–1.0)
NEUTROS ABS: 2.5 10*3/uL (ref 1.7–7.7)
Neutrophils Relative %: 40 %
Platelets: ADEQUATE 10*3/uL (ref 150–400)
RBC: 4.62 MIL/uL (ref 3.87–5.11)
RDW: 13.2 % (ref 11.5–15.5)
WBC: 6.3 10*3/uL (ref 4.0–10.5)

## 2016-07-13 LAB — COMPREHENSIVE METABOLIC PANEL
ALK PHOS: 51 U/L (ref 38–126)
ALT: 14 U/L (ref 14–54)
AST: 27 U/L (ref 15–41)
Albumin: 3.6 g/dL (ref 3.5–5.0)
Anion gap: 10 (ref 5–15)
BUN: 17 mg/dL (ref 6–20)
CALCIUM: 9.7 mg/dL (ref 8.9–10.3)
CO2: 29 mmol/L (ref 22–32)
CREATININE: 0.93 mg/dL (ref 0.44–1.00)
Chloride: 98 mmol/L — ABNORMAL LOW (ref 101–111)
GFR calc non Af Amer: >60 mL/min (ref >60)
GLUCOSE: 112 mg/dL — AB (ref 65–99)
Potassium: 3.5 mmol/L (ref 3.5–5.1)
SODIUM: 137 mmol/L (ref 135–145)
Total Bilirubin: 1 mg/dL (ref 0.3–1.2)
Total Protein: 7.3 g/dL (ref 6.5–8.1)

## 2016-07-13 MED ORDER — DIPHENHYDRAMINE HCL 25 MG PO CAPS
ORAL_CAPSULE | ORAL | Status: AC
  Filled 2016-07-13: qty 1

## 2016-07-13 MED ORDER — ACETAMINOPHEN 325 MG PO TABS
ORAL_TABLET | ORAL | Status: AC
  Filled 2016-07-13: qty 2

## 2016-07-13 MED ORDER — DIPHENHYDRAMINE HCL 25 MG PO CAPS
25.0000 mg | ORAL_CAPSULE | ORAL | Status: DC
  Administered 2016-07-13: 25 mg via ORAL

## 2016-07-13 MED ORDER — SODIUM CHLORIDE 0.9 % IV SOLN
6.0000 mg/kg | INTRAVENOUS | Status: DC
  Administered 2016-07-13: 500 mg via INTRAVENOUS
  Filled 2016-07-13: qty 50

## 2016-07-13 MED ORDER — ACETAMINOPHEN 325 MG PO TABS
650.0000 mg | ORAL_TABLET | ORAL | Status: DC
  Administered 2016-07-13: 650 mg via ORAL

## 2016-07-13 NOTE — Progress Notes (Signed)
Labs stable

## 2016-07-27 ENCOUNTER — Other Ambulatory Visit: Payer: Self-pay | Admitting: Radiology

## 2016-08-24 ENCOUNTER — Encounter (HOSPITAL_COMMUNITY): Payer: Self-pay

## 2016-08-25 ENCOUNTER — Other Ambulatory Visit: Payer: Self-pay | Admitting: Radiology

## 2016-08-25 NOTE — Progress Notes (Signed)
remicade CBC CMP Tylenol and bendadryl orders are in system TB gold due now, have placed order

## 2016-09-11 ENCOUNTER — Encounter (HOSPITAL_COMMUNITY): Payer: Self-pay

## 2016-09-15 ENCOUNTER — Other Ambulatory Visit (HOSPITAL_COMMUNITY): Payer: Self-pay | Admitting: *Deleted

## 2016-09-18 ENCOUNTER — Encounter (HOSPITAL_COMMUNITY)
Admission: RE | Admit: 2016-09-18 | Discharge: 2016-09-18 | Disposition: A | Discharge status: Home / Self Care | Payer: Medicare Other | Source: Ambulatory Visit | Attending: Rheumatology | Admitting: Rheumatology

## 2016-09-18 DIAGNOSIS — M0579 Rheumatoid arthritis with rheumatoid factor of multiple sites without organ or systems involvement: Secondary | ICD-10-CM | POA: Diagnosis present

## 2016-09-18 LAB — COMPREHENSIVE METABOLIC PANEL
ALT: 23 U/L (ref 14–54)
AST: 26 U/L (ref 15–41)
Albumin: 3.5 g/dL (ref 3.5–5.0)
Alkaline Phosphatase: 55 U/L (ref 38–126)
Anion gap: 9 (ref 5–15)
BILIRUBIN TOTAL: 0.4 mg/dL (ref 0.3–1.2)
BUN: 11 mg/dL (ref 6–20)
CO2: 31 mmol/L (ref 22–32)
CREATININE: 0.9 mg/dL (ref 0.44–1.00)
Calcium: 9.7 mg/dL (ref 8.9–10.3)
Chloride: 98 mmol/L — ABNORMAL LOW (ref 101–111)
GFR calc Af Amer: >60 mL/min (ref >60)
Glucose, Bld: 146 mg/dL — ABNORMAL HIGH (ref 65–99)
POTASSIUM: 3.5 mmol/L (ref 3.5–5.1)
Sodium: 138 mmol/L (ref 135–145)
TOTAL PROTEIN: 7 g/dL (ref 6.5–8.1)

## 2016-09-18 LAB — CBC WITH DIFFERENTIAL/PLATELET
BASOS ABS: 0 10*3/uL (ref 0.0–0.1)
Basophils Relative: 1 %
Eosinophils Absolute: 0.3 10*3/uL (ref 0.0–0.7)
Eosinophils Relative: 6 %
HEMATOCRIT: 39 % (ref 36.0–46.0)
Hemoglobin: 12.7 g/dL (ref 12.0–15.0)
LYMPHS ABS: 2.9 10*3/uL (ref 0.7–4.0)
LYMPHS PCT: 48 %
MCH: 29.2 pg (ref 26.0–34.0)
MCHC: 32.6 g/dL (ref 30.0–36.0)
MCV: 89.7 fL (ref 78.0–100.0)
MONO ABS: 0.5 10*3/uL (ref 0.1–1.0)
MONOS PCT: 8 %
NEUTROS ABS: 2.2 10*3/uL (ref 1.7–7.7)
Neutrophils Relative %: 37 %
Platelets: 379 10*3/uL (ref 150–400)
RBC: 4.35 MIL/uL (ref 3.87–5.11)
RDW: 13.2 % (ref 11.5–15.5)
WBC: 5.9 10*3/uL (ref 4.0–10.5)

## 2016-09-18 MED ORDER — DIPHENHYDRAMINE HCL 25 MG PO CAPS
25.0000 mg | ORAL_CAPSULE | ORAL | Status: DC
  Administered 2016-09-18: 25 mg via ORAL

## 2016-09-18 MED ORDER — SODIUM CHLORIDE 0.9 % IV SOLN
6.0000 mg/kg | INTRAVENOUS | Status: DC
  Administered 2016-09-18: 500 mg via INTRAVENOUS
  Filled 2016-09-18: qty 50

## 2016-09-18 MED ORDER — DIPHENHYDRAMINE HCL 25 MG PO CAPS
ORAL_CAPSULE | ORAL | Status: AC
  Filled 2016-09-18: qty 1

## 2016-09-18 MED ORDER — ACETAMINOPHEN 325 MG PO TABS
650.0000 mg | ORAL_TABLET | ORAL | Status: DC
  Administered 2016-09-18: 650 mg via ORAL

## 2016-09-18 MED ORDER — ACETAMINOPHEN 325 MG PO TABS
ORAL_TABLET | ORAL | Status: AC
  Filled 2016-09-18: qty 2

## 2016-09-18 NOTE — Progress Notes (Signed)
Glucose mildly elevated rest is normal

## 2016-09-20 LAB — QUANTIFERON IN TUBE
QFT TB AG MINUS NIL VALUE: 0.02 [IU]/mL
QUANTIFERON MITOGEN VALUE: 7.67 [IU]/mL
QUANTIFERON TB AG VALUE: 0.05 [IU]/mL
QUANTIFERON TB GOLD: NEGATIVE
Quantiferon Nil Value: 0.03 IU/mL

## 2016-09-20 LAB — QUANTIFERON TB GOLD ASSAY (BLOOD)

## 2016-09-20 NOTE — Progress Notes (Signed)
TB gold neg

## 2016-10-24 ENCOUNTER — Other Ambulatory Visit: Payer: Self-pay | Admitting: Radiology

## 2016-10-24 DIAGNOSIS — M0579 Rheumatoid arthritis with rheumatoid factor of multiple sites without organ or systems involvement: Secondary | ICD-10-CM

## 2016-10-24 NOTE — Progress Notes (Signed)
Infusion orders are current for patient CBC CMP Tylenol Benadryl TB gold not due yet, appointments are up to date and follow up appointment  is scheduled 

## 2016-11-06 ENCOUNTER — Encounter (HOSPITAL_COMMUNITY): Payer: Self-pay

## 2016-11-10 ENCOUNTER — Other Ambulatory Visit (HOSPITAL_COMMUNITY): Payer: Self-pay | Admitting: *Deleted

## 2016-11-13 ENCOUNTER — Encounter (HOSPITAL_COMMUNITY)
Admission: RE | Admit: 2016-11-13 | Discharge: 2016-11-13 | Disposition: A | Discharge status: Home / Self Care | Payer: Medicare Other | Source: Ambulatory Visit | Attending: Rheumatology | Admitting: Rheumatology

## 2016-11-13 DIAGNOSIS — M0579 Rheumatoid arthritis with rheumatoid factor of multiple sites without organ or systems involvement: Secondary | ICD-10-CM | POA: Diagnosis present

## 2016-11-13 LAB — COMPREHENSIVE METABOLIC PANEL
ALBUMIN: 3.5 g/dL (ref 3.5–5.0)
ALT: 20 U/L (ref 14–54)
ANION GAP: 9 (ref 5–15)
AST: 27 U/L (ref 15–41)
Alkaline Phosphatase: 57 U/L (ref 38–126)
BILIRUBIN TOTAL: 0.6 mg/dL (ref 0.3–1.2)
BUN: 13 mg/dL (ref 6–20)
CHLORIDE: 99 mmol/L — AB (ref 101–111)
CO2: 29 mmol/L (ref 22–32)
Calcium: 9.7 mg/dL (ref 8.9–10.3)
Creatinine, Ser: 1.01 mg/dL — ABNORMAL HIGH (ref 0.44–1.00)
GFR calc Af Amer: >60 mL/min (ref >60)
Glucose, Bld: 142 mg/dL — ABNORMAL HIGH (ref 65–99)
POTASSIUM: 3.1 mmol/L — AB (ref 3.5–5.1)
Sodium: 137 mmol/L (ref 135–145)
TOTAL PROTEIN: 7.4 g/dL (ref 6.5–8.1)

## 2016-11-13 LAB — CBC WITH DIFFERENTIAL/PLATELET
BASOS ABS: 0 10*3/uL (ref 0.0–0.1)
BASOS PCT: 1 %
EOS PCT: 8 %
Eosinophils Absolute: 0.7 10*3/uL (ref 0.0–0.7)
HEMATOCRIT: 37.8 % (ref 36.0–46.0)
Hemoglobin: 12.8 g/dL (ref 12.0–15.0)
Lymphocytes Relative: 38 %
Lymphs Abs: 3.1 10*3/uL (ref 0.7–4.0)
MCH: 30 pg (ref 26.0–34.0)
MCHC: 33.9 g/dL (ref 30.0–36.0)
MCV: 88.5 fL (ref 78.0–100.0)
MONOS PCT: 6 %
Monocytes Absolute: 0.5 10*3/uL (ref 0.1–1.0)
NEUTROS ABS: 3.9 10*3/uL (ref 1.7–7.7)
Neutrophils Relative %: 47 %
PLATELETS: 326 10*3/uL (ref 150–400)
RBC: 4.27 MIL/uL (ref 3.87–5.11)
RDW: 13.8 % (ref 11.5–15.5)
WBC: 8.2 10*3/uL (ref 4.0–10.5)

## 2016-11-13 MED ORDER — ACETAMINOPHEN 325 MG PO TABS
ORAL_TABLET | ORAL | Status: AC
  Filled 2016-11-13: qty 1

## 2016-11-13 MED ORDER — DIPHENHYDRAMINE HCL 25 MG PO CAPS: 25.0000 mg | ORAL_CAPSULE | ORAL | Status: DC

## 2016-11-13 MED ORDER — SODIUM CHLORIDE 0.9 % IV SOLN
6.0000 mg/kg | INTRAVENOUS | Status: DC
  Administered 2016-11-13: 500 mg via INTRAVENOUS
  Filled 2016-11-13: qty 50

## 2016-11-13 MED ORDER — ACETAMINOPHEN 325 MG PO TABS
650.0000 mg | ORAL_TABLET | ORAL | Status: DC
  Administered 2016-11-13: 325 mg via ORAL

## 2016-11-13 NOTE — Progress Notes (Signed)
We'll monitor for now Mild increase in creatinine

## 2016-11-14 ENCOUNTER — Telehealth: Payer: Self-pay | Admitting: Rheumatology

## 2016-11-14 NOTE — Telephone Encounter (Signed)
Patient returning your call.

## 2016-11-14 NOTE — Telephone Encounter (Signed)
Patient advised of lab results and verbalized understanding.  

## 2016-12-05 ENCOUNTER — Telehealth: Payer: Self-pay | Admitting: Rheumatology

## 2016-12-05 ENCOUNTER — Telehealth: Payer: Self-pay | Admitting: Radiology

## 2016-12-05 ENCOUNTER — Other Ambulatory Visit: Payer: Self-pay | Admitting: Radiology

## 2016-12-05 DIAGNOSIS — M0579 Rheumatoid arthritis with rheumatoid factor of multiple sites without organ or systems involvement: Secondary | ICD-10-CM

## 2016-12-05 NOTE — Progress Notes (Signed)
Office Visit Note  Patient: Wendy Woods             Date of Birth: [birth year]           MRN: [id number]             PCP: Fleet Contras, MD Referring: Fleet Contras, MD Visit Date: 12/08/2016 Occupation: @GUAROCC @    Subjective:  Joint is stiffness.   History of Present Illness: Wendy Woods is a 56 y.o. female medical history of sero positive rheumatoid arthritis. She states she is doing quite well on Remicade and methotrexate combination. She has noticed a bump on her left wrist she's concerned about. She also has noticed a knot on her left third finger. She is some stiffness in her joints but no joint swelling. She has some discomfort in her bilateral hands and bilateral feet off-and-on. She continues to have some discomfort in her neck and lower back. Patient reports that couple of weeks ago she had some irritation in her eyes and she was seen by Dr. 53 who gave her some teardrops.  Activities of Daily Living:  Patient reports morning stiffness for 15 minutes.   Patient Reports nocturnal pain. Lower back Difficulty dressing/grooming: Denies Difficulty climbing stairs: Denies Difficulty getting out of chair: Reports Difficulty using hands for taps, buttons, cutlery, and/or writing: Denies   Review of Systems  Constitutional: Negative for fatigue, night sweats, weight gain, weight loss and weakness.  HENT: Negative for mouth sores, trouble swallowing, trouble swallowing, mouth dryness and nose dryness.   Eyes: Positive for dryness. Negative for pain, redness and visual disturbance.  Respiratory: Negative for cough, shortness of breath and difficulty breathing.   Cardiovascular: Negative for chest pain, palpitations, hypertension, irregular heartbeat and swelling in legs/feet.  Gastrointestinal: Negative for blood in stool, constipation and diarrhea.  Endocrine: Negative for increased urination.  Genitourinary: Negative for vaginal dryness.  Musculoskeletal: Positive for  arthralgias, joint pain and morning stiffness. Negative for joint swelling, myalgias, muscle weakness, muscle tenderness and myalgias.  Skin: Negative for color change, rash, hair loss, skin tightness, ulcers and sensitivity to sunlight.  Allergic/Immunologic: Negative for susceptible to infections.  Neurological: Negative for dizziness, memory loss and night sweats.  Hematological: Negative for swollen glands.  Psychiatric/Behavioral: Negative for depressed mood. The patient is not nervous/anxious.     PMFS History:  Patient Active Problem List   Diagnosis Date Noted  . DDD (degenerative disc disease), cervical 03/22/2016  . DDD (degenerative disc disease), lumbar 03/22/2016  . 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 Angioedema 07/15/2014  . Hypokalemia 07/15/2014  . Diabetes mellitus, controlled (HCC) 07/03/2012  . HTN (hypertension) 07/03/2012  . Rheumatoid arthritis (HCC) 07/03/2012  . Current chronic use of systemic steroids 07/03/2012  . 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 DDD (degenerative disc disease), cervical 03/22/2016  . DDD (degenerative disc disease), lumbar 03/22/2016  . GERD (gastroesophageal reflux disease)   . Hyperlipidemia   . Hypertension   . Rheumatoid arthritis(714.0)   . 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 Type II diabetes mellitus (HCC)     Family History  Problem Relation Age of Onset  . Colon polyps Father   . Prostate cancer Father   . Colon cancer Neg Hx    Past Surgical History:  Procedure Laterality Date  . APPENDECTOMY  1968  . CATARACT EXTRACTION W/ INTRAOCULAR LENS  IMPLANT, BILATERAL Bilateral 11/2013-12/2013  . KNEE ARTHROPLASTY    . KNEE ARTHROSCOPY Left 1980's-1990's X  3  . MYOMECTOMY  1990's   Social History   Social History Narrative  . No narrative on file     Objective: Vital Signs: BP 134/76 (BP Location: Left Arm, Patient Position: Sitting, Cuff Size: Normal)   Pulse 65   Ht 5\' 4"  (1.626 m)   Wt  174 lb (78.9 kg)   LMP 07/09/2014 Comment: "irregular"  BMI 29.87 kg/m    Physical Exam  Constitutional: She is oriented to person, place, and time. She appears well-developed and well-nourished.  HENT:  Head: Normocephalic and atraumatic.  Eyes: Conjunctivae and EOM are normal.  Neck: Normal range of motion.  Cardiovascular: Normal rate, regular rhythm, normal heart sounds and intact distal pulses.   Pulmonary/Chest: Effort normal and breath sounds normal.  Abdominal: Soft. Bowel sounds are normal.  Lymphadenopathy:    She has no cervical adenopathy.  Neurological: She is alert and oriented to person, place, and time.  Skin: Skin is warm and dry. Capillary refill takes less than 2 seconds.  Psychiatric: She has a normal mood and affect. Her behavior is normal.  Nursing note and vitals reviewed.    Musculoskeletal Exam: C-spine and thoracic lumbar spine good range of motion. Shoulder joints elbow joints wrist joints MCPs PIPs with good range of motion with some limitation of the wrist joint. No warmth swelling or effusion was noted. She is some tenderness on palpation of her left wrist. Hip joints knee joints ankles MTPs PIPs with good range of motion with no synovitis. She describes some tenderness on palpation of her right ankle.  CDAI Exam: CDAI Homunculus Exam:   Tenderness:  LUE: wrist Right hand: 3rd DIP RLE: tibiotalar  Joint Counts:  CDAI Tender Joint count: 1 CDAI Swollen Joint count: 0  Global Assessments:  Patient Global Assessment: 5 Provider Global Assessment: 3  CDAI Calculated Score: 9    Investigation: TB GOLD 09/18/2016 negative No additional findings. CBC Latest Ref Rng & Units 11/13/2016 09/18/2016 07/13/2016  WBC 4.0 - 10.5 K/uL 8.2 5.9 6.3  Hemoglobin 12.0 - 15.0 g/dL 07/15/2016 26.8 34.1  Hematocrit 36.0 - 46.0 % 37.8 39.0 40.9  Platelets 150 - 400 K/uL 326 379 PLATELET CLUMPS NOTED ON SMEAR, COUNT APPEARS ADEQUATE   CMP     Component Value Date/Time    NA 137 11/13/2016 0827   K 3.1 (L) 11/13/2016 0827   CL 99 (L) 11/13/2016 0827   CO2 29 11/13/2016 0827   GLUCOSE 142 (H) 11/13/2016 0827   BUN 13 11/13/2016 0827   CREATININE 1.01 (H) 11/13/2016 0827   CALCIUM 9.7 11/13/2016 0827   PROT 7.4 11/13/2016 0827   ALBUMIN 3.5 11/13/2016 0827   AST 27 11/13/2016 0827   ALT 20 11/13/2016 0827   ALKPHOS 57 11/13/2016 0827   BILITOT 0.6 11/13/2016 0827   GFRNONAA >60 11/13/2016 0827   GFRAA >60 11/13/2016 0827   Imaging: Xr Foot 2 Views Left  Result Date: 12/08/2016 PIP/DIP narrowing was noted. No MTP joint narrowing or erosive changes were noted. No intertarsal joint space narrowing was noted. Impression: Findings are consistent with osteoarthritis.   Xr Foot 2 Views Right  Result Date: 12/08/2016 PIP/DIP narrowing was noted. No MTP joint narrowing or erosive changes were noted. No intertarsal joint space narrowing was noted. Impression: Findings are consistent with osteoarthritis.  Xr Hand 2 View Left  Result Date: 12/08/2016 PIP/DIP narrowing was noted. Subluxation of third digit DIP was noted. Juxta articular osteopenia was noted. Intercarpal and radiocarpal joint space narrowing was  noted. No erosive changes were noted. Impression: These findings are consistent with rheumatoid arthritis and osteoarthritis overlap.  Xr Hand 2 View Right  Result Date: 12/08/2016 Juxta articular osteopenia was noted. Narrowing of the right second MCP joint was noted. Radiocarpal and intercarpal joint space narrowing was noted. No erosive changes were noted. PIP/DIP narrowing was noted. Impression: These findings are consistent with rheumatoid arthritis and osteoarthritis overlap   Speciality Comments: No specialty comments available.    Procedures:  No procedures performed Allergies: Latex; Lisinopril; and Penicillins   Assessment / Plan:     Visit Diagnoses: Rheumatoid arthritis involving multiple sites with positive rheumatoid factor (HCC) -  + RF and + Anti-CCP, neg ANA  has had severe disease with recurrent scleritis in the past. She is currently doing well without any synovitis on examination although she complains of him arthralgias in her hands and feet.  Pain in both hands - Plan: XR Hand 2 View Right, XR Hand 2 View Left  Primary osteoarthritis of both hands: She has some DIP PIP changes consistent with osteoarthritis. She also has osteoarthritic changes in her right third digit DIP joint with mild subluxation. I've given her a prescription for finger splint which [facility] can [facility] from [facility].  Pain in both feet - Plan: XR Foot 2 Views Right, XR Foot 2 Views Left  Scleritis, unspecified laterality: Patient had recent issues with her eye. According to her she was seen by ophthalmologist and was given teardrops. I do not see any conjunctival injection today.  High risk medication use - Remicade 6 mg/kg every 6 weeks, Otrexup 20 mg every week, folic acid 2 mg by mouth daily. Her last labs show elevated glucose and low potassium. I've advised her to follow up with her PCP closely. She also sees endocrinologist. She will continue to get her labs monitored with her infusions.  DDD (degenerative disc disease), cervical: Chronic pain  DDD (degenerative disc disease), lumbar: Chronic pain  Current chronic use of systemic steroids in the past prior to control of her scleritis.  Controlled type 2 diabetes mellitus  Essential hypertension : Her blood pressure is well controlled.   Orders: Orders Placed This Encounter  Procedures  . XR Hand 2 View Right  . XR Hand 2 View Left  . XR Foot 2 Views Right  . XR Foot 2 Views Left   No orders of the defined types were placed in this encounter.   Face-to-face time spent with patient was 30 minutes. 50% of time was spent in counseling and coordination of care.  Follow-Up Instructions: Return in about 4 months (around 04/09/2017) for Rheumatoid arthritis.   Pollyann Savoy, MD  Note - This record has been created using Animal nutritionist.  Chart creation errors have been sought, but may not always  have been located. Such creation errors do not reflect on  the standard of medical care.

## 2016-12-05 NOTE — Telephone Encounter (Signed)
Left message on machine for patient to call back to schedule follow up.  

## 2016-12-05 NOTE — Telephone Encounter (Signed)
Please ask Pt to come in this Friday. We can not delay her tt for long.

## 2016-12-05 NOTE — Telephone Encounter (Signed)
Left message on machine for patient to call back to schedule for this Friday.

## 2016-12-05 NOTE — Telephone Encounter (Deleted)
-----   Message from Amy W Littrell, RT sent at 12/05/2016  2:46 PM EDT ----- Patient past due for appointment with Dr Deveshwar/ needs one scheduled before I can send medication orders to the hospital, will you call her to make appt,and let me know when it is scheduled ? 

## 2016-12-05 NOTE — Telephone Encounter (Deleted)
-----   Message from Wendy Woods, RT sent at 12/05/2016  2:46 PM EDT ----- Patient past due for appointment with Dr Deveshwar/ needs one scheduled before I can send medication orders to the hospital, will you call her to make appt,and let me know when it is scheduled ? 

## 2016-12-05 NOTE — Telephone Encounter (Signed)
-----   Message from Caffie Damme, RT sent at 12/05/2016  2:46 PM EDT ----- Patient past due for appointment with Dr Deveshwar/ needs one scheduled before I can send medication orders to the hospital, will you call her to make appt,and let me know when it is scheduled ?

## 2016-12-05 NOTE — Telephone Encounter (Signed)
Patient was last here for visit in November. She has cancelled her follow up visit. I can not put in infusion orders until she has appointment. Have sent message for front desk to make appointment for her. I will let you know when she has scheduled a follow up appointment.   To you FYI

## 2016-12-05 NOTE — Telephone Encounter (Signed)
Left message on machine for patient to call back to schedule follow up appt.  

## 2016-12-08 ENCOUNTER — Encounter: Payer: Self-pay | Admitting: Rheumatology

## 2016-12-08 ENCOUNTER — Ambulatory Visit (INDEPENDENT_AMBULATORY_CARE_PROVIDER_SITE_OTHER): Payer: Self-pay

## 2016-12-08 ENCOUNTER — Ambulatory Visit (INDEPENDENT_AMBULATORY_CARE_PROVIDER_SITE_OTHER): Payer: Medicare Other | Admitting: Rheumatology

## 2016-12-08 ENCOUNTER — Ambulatory Visit (INDEPENDENT_AMBULATORY_CARE_PROVIDER_SITE_OTHER): Payer: Medicare Other

## 2016-12-08 VITALS — BP 134/76 | HR 65 | Ht 64.0 in | Wt 174.0 lb

## 2016-12-08 DIAGNOSIS — E119 Type 2 diabetes mellitus without complications: Secondary | ICD-10-CM | POA: Diagnosis not present

## 2016-12-08 DIAGNOSIS — M79671 Pain in right foot: Secondary | ICD-10-CM

## 2016-12-08 DIAGNOSIS — M79641 Pain in right hand: Secondary | ICD-10-CM | POA: Diagnosis not present

## 2016-12-08 DIAGNOSIS — Z7952 Long term (current) use of systemic steroids: Secondary | ICD-10-CM | POA: Diagnosis not present

## 2016-12-08 DIAGNOSIS — M79642 Pain in left hand: Secondary | ICD-10-CM

## 2016-12-08 DIAGNOSIS — M51369 Other intervertebral disc degeneration, lumbar region without mention of lumbar back pain or lower extremity pain: Secondary | ICD-10-CM

## 2016-12-08 DIAGNOSIS — Z79899 Other long term (current) drug therapy: Secondary | ICD-10-CM

## 2016-12-08 DIAGNOSIS — H15009 Unspecified scleritis, unspecified eye: Secondary | ICD-10-CM | POA: Diagnosis not present

## 2016-12-08 DIAGNOSIS — M19041 Primary osteoarthritis, right hand: Secondary | ICD-10-CM

## 2016-12-08 DIAGNOSIS — M19042 Primary osteoarthritis, left hand: Secondary | ICD-10-CM

## 2016-12-08 DIAGNOSIS — M5136 Other intervertebral disc degeneration, lumbar region: Secondary | ICD-10-CM | POA: Diagnosis not present

## 2016-12-08 DIAGNOSIS — M0579 Rheumatoid arthritis with rheumatoid factor of multiple sites without organ or systems involvement: Secondary | ICD-10-CM | POA: Diagnosis not present

## 2016-12-08 DIAGNOSIS — M79672 Pain in left foot: Secondary | ICD-10-CM | POA: Diagnosis not present

## 2016-12-08 DIAGNOSIS — I10 Essential (primary) hypertension: Secondary | ICD-10-CM | POA: Diagnosis not present

## 2016-12-08 DIAGNOSIS — M5030 Other cervical disc degeneration, unspecified cervical region: Secondary | ICD-10-CM | POA: Diagnosis not present

## 2016-12-12 ENCOUNTER — Other Ambulatory Visit: Payer: Self-pay | Admitting: Radiology

## 2016-12-12 DIAGNOSIS — H15003 Unspecified scleritis, bilateral: Secondary | ICD-10-CM

## 2016-12-12 DIAGNOSIS — M0579 Rheumatoid arthritis with rheumatoid factor of multiple sites without organ or systems involvement: Secondary | ICD-10-CM

## 2016-12-12 NOTE — Progress Notes (Signed)
Infusion orders are current for patient CBC CMP Tylenol Benadryl appointments are up to date and follow up appointment  is scheduled TB gold not due yet, due on 08/2017 

## 2016-12-22 ENCOUNTER — Other Ambulatory Visit (HOSPITAL_COMMUNITY): Payer: Self-pay | Admitting: *Deleted

## 2016-12-25 ENCOUNTER — Encounter (HOSPITAL_COMMUNITY)
Admission: RE | Admit: 2016-12-25 | Discharge: 2016-12-25 | Disposition: A | Discharge status: Home / Self Care | Payer: Medicare Other | Source: Ambulatory Visit | Attending: Rheumatology | Admitting: Rheumatology

## 2016-12-25 DIAGNOSIS — M0579 Rheumatoid arthritis with rheumatoid factor of multiple sites without organ or systems involvement: Secondary | ICD-10-CM | POA: Diagnosis present

## 2016-12-25 MED ORDER — ACETAMINOPHEN 325 MG PO TABS: 650.0000 mg | ORAL_TABLET | ORAL | Status: DC

## 2016-12-25 MED ORDER — DIPHENHYDRAMINE HCL 25 MG PO CAPS: 25.0000 mg | ORAL_CAPSULE | ORAL | Status: DC

## 2016-12-25 MED ORDER — SODIUM CHLORIDE 0.9 % IV SOLN
6.0000 mg/kg | INTRAVENOUS | Status: DC
  Administered 2016-12-25: 500 mg via INTRAVENOUS
  Filled 2016-12-25: qty 50

## 2017-01-15 ENCOUNTER — Telehealth: Payer: Self-pay | Admitting: Rheumatology

## 2017-01-15 NOTE — Telephone Encounter (Signed)
Patient called in about left foot pain she is experiencing and it started yesterday. Please call about symptoms.

## 2017-01-15 NOTE — Telephone Encounter (Signed)
Should see PCP/ ortho/ urg care

## 2017-01-15 NOTE — Telephone Encounter (Signed)
Patient states she is having trouble putting pressure on her left foot. Patient states she is having the problem on the bottom of her foot. Patient states she was walking and felt like she step on something and states she has noticed swelling on the sole of her foot. Patient was requesting and appointment to be seen. Please advise.

## 2017-01-16 ENCOUNTER — Encounter (HOSPITAL_COMMUNITY): Payer: Self-pay | Admitting: *Deleted

## 2017-01-16 ENCOUNTER — Ambulatory Visit (HOSPITAL_COMMUNITY)
Admission: EM | Admit: 2017-01-16 | Discharge: 2017-01-16 | Disposition: A | Discharge status: Home / Self Care | Payer: Medicare Other | Attending: Family Medicine | Admitting: Family Medicine

## 2017-01-16 ENCOUNTER — Ambulatory Visit (INDEPENDENT_AMBULATORY_CARE_PROVIDER_SITE_OTHER): Payer: Medicare Other

## 2017-01-16 DIAGNOSIS — S93492A Sprain of other ligament of left ankle, initial encounter: Secondary | ICD-10-CM | POA: Diagnosis not present

## 2017-01-16 DIAGNOSIS — M79672 Pain in left foot: Secondary | ICD-10-CM | POA: Diagnosis not present

## 2017-01-16 NOTE — ED Provider Notes (Signed)
[facility]    CSN: [id number] Arrival date & time: 01/16/17  1000  History   Chief Complaint Chief Complaint  Patient presents with  . Foot Pain    HPI Wendy Woods is a 56 y.o. female.   HPI   Patient presents with L foot pain. Began two days ago. Reports that she thinks she stepped on something when she was walking barefoot in her house in the middle of the night and subsequently began to notice swelling on the bottom of that foot. Has also noted swelling in her L ankle as well. Last night pain became so severe that she is now having difficulty bearing weight on the L foot. Has been soaking her foot in Epsom salts and alcohol which she thinks improved the swelling. She examined her foot to see if there was anything in it but did not see any evidence of a foreign body. Denies any bleeding or discharge either at time of injury or afterwards.  Does have history of diabetes but denies neuropathy.     Past Medical History:  Diagnosis Date  . DDD (degenerative disc disease), cervical 03/22/2016  . DDD (degenerative disc disease), lumbar 03/22/2016  . GERD (gastroesophageal reflux disease)   . Hyperlipidemia   . Hypertension   . Rheumatoid arthritis(714.0)   . 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 Type II diabetes mellitus [facility])     Patient Active Problem List   Diagnosis Date Noted  . DDD (degenerative disc disease), cervical 03/22/2016  . DDD (degenerative disc disease), lumbar 03/22/2016  . 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 Angioedema 07/15/2014  . Hypokalemia 07/15/2014  . Diabetes mellitus, controlled ([place]) 07/03/2012  . HTN (hypertension) 07/03/2012  . Rheumatoid arthritis ([place]) 07/03/2012  . Current chronic use of systemic steroids 07/03/2012  . 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 APPENDECTOMY  1968  . CATARACT EXTRACTION W/ INTRAOCULAR LENS  IMPLANT, BILATERAL Bilateral 11/2013-12/2013  . KNEE ARTHROPLASTY    . KNEE  ARTHROSCOPY Left 1980's-1990's X 3  . MYOMECTOMY  1990's    OB History    No data available       Home Medications    Prior to Admission medications   Medication Sig Start Date End Date Taking? Authorizing Provider  acetaminophen (TYLENOL) 500 MG tablet Take 1,000 mg by mouth every 6 (six) hours as needed for pain.    [provider]  amLODipine (NORVASC) 5 MG tablet Take 1 tablet (5 mg total) by mouth daily. 07/16/14   Allie Bossier, MD  cholecalciferol (VITAMIN D) 1000 units tablet Take 400 Units by mouth daily.    [provider]  clindamycin-benzoyl peroxide (BENZACLIN) gel  11/27/16   [provider]  EPINEPHrine (EPIPEN 2-PAK) 0.3 mg/0.3 mL IJ SOAJ injection Inject 0.3 mLs (0.3 mg total) into the muscle once. 07/16/14   Allie Bossier, MD  famotidine (PEPCID) 20 MG tablet Take 1 tablet (20 mg total) by mouth 2 (two) times daily. 07/16/14   Allie Bossier, MD  folic acid (FOLVITE) 1 MG tablet Take 1 mg by mouth daily.    [provider]  gabapentin (NEURONTIN) 300 MG capsule Take 300 mg by mouth 4 (four) times daily.    [provider]  hydrochlorothiazide (HYDRODIURIL) 25 MG tablet Take 1 tablet (25 mg total) by mouth daily. 07/16/14   Allie Bossier, MD  inFLIXimab (REMICADE) 100 MG injection Inject into the vein.  [provider]  Insulin Glargine (LANTUS SOLOSTAR) 100 UNIT/ML Solostar Pen Inject 10 Units into the skin daily at 10 pm. Patient not taking: Reported on 03/27/2016 07/16/14   Allie Bossier, MD  Insulin Pen Needle 29G X 12.7MM MISC 10 Units by Does not apply route at bedtime. Patient not taking: Reported on 12/08/2016 07/16/14   Allie Bossier, MD  INVOKANA 100 MG TABS tablet Take 100 mg by mouth daily.  06/22/14   [provider]  metFORMIN (GLUCOPHAGE) 500 MG tablet Take 1 tablet (500 mg total) by mouth 2 (two) times daily with a meal. 07/06/12   Bynum Bellows, MD  methotrexate 50 MG/2ML injection  Inject 0.3 mLs (7.5 mg total) into the skin once a week. 05/05/16   Bo Merino, MD  Methotrexate, PF, 15 MG/0.4ML SOAJ Inject 15 mg into the skin once a week. Patient not taking: Reported on 12/08/2016 04/20/16   Bo Merino, MD  potassium chloride (K-DUR) 10 MEQ tablet Take 10 mEq by mouth daily. 03/03/16   [provider]  predniSONE (DELTASONE) 10 MG tablet Take 4 tablets (40 mg total) by mouth daily. Patient not taking: Reported on 03/27/2016 07/16/14   Allie Bossier, MD  Tuberculin-Allergy Syringes 27G X 1/2" 1 ML KIT Inject 1 Syringe into the skin once a week. 05/05/16   Bo Merino, MD    Family History Family History  Problem Relation Age of Onset  . Colon polyps Father   . Prostate cancer Father   . Colon cancer Neg Hx     Social History Social History  Substance Use Topics  . Smoking status: Never Smoker  . Smokeless tobacco: Never Used  . Alcohol use No     Allergies   Latex; Lisinopril; and Penicillins   Review of Systems Review of Systems  Skin: Negative for wound.  Neurological: Negative for dizziness, weakness and numbness.    Physical Exam Triage Vital Signs ED Triage Vitals [01/16/17 1021]  Enc Vitals Group     BP      Pulse      Resp      Temp      Temp src      SpO2      Weight      Height      Head Circumference      Peak Flow      Pain Score 10     Pain Loc      Pain Edu?      Excl. in [place]?    No data found.   Updated Vital Signs LMP 07/09/2014 Comment: "irregular"  Physical Exam  Constitutional: She is oriented to person, place, and time. She appears well-developed and well-nourished. No distress.  HENT:  Head: Normocephalic and atraumatic.  Nose: Nose normal.  Mouth/Throat: Oropharynx is clear and moist. No oropharyngeal exudate.  Eyes: Pupils are equal, round, and reactive to light. Conjunctivae and EOM are normal. Right eye exhibits no discharge. Left eye exhibits no discharge.  Neck: Normal range of  motion. Neck supple.  Cardiovascular: Normal rate, regular rhythm and normal heart sounds.   No murmur heard. Pulmonary/Chest: Effort normal and breath sounds normal. No respiratory distress. She has no wheezes.  Abdominal: Soft. Bowel sounds are normal. She exhibits no distension. There is no tenderness.  Musculoskeletal:  TTP of medial plantar surface of L foot as well as lateral L ankle and dorsal surface of L foot. Questionable bruising on ankle and plantar surface though difficult  to tell given patient's skin tone. No erythema noted. Swelling of L ankle, no swelling of plantar surface. No puncture marks on plantar surface, no bleeding or discharge or areas of fluctuance. Limited ROM of ankle due to pain. Is able to bear weight on L foot but with significant pain.   Neurological: She is alert and oriented to person, place, and time.  Sensation to light touch fully intact on feet bilaterally.   Skin: Skin is warm and dry.  Psychiatric: She has a normal mood and affect. Her behavior is normal.    UC Treatments / Results  Labs (all labs ordered are listed, but only abnormal results are displayed) Labs Reviewed - No data to display  EKG  EKG Interpretation None       Radiology Dg Foot Complete Left  Result Date: 01/16/2017 CLINICAL DATA:  Pain after dropping heavy object on foot EXAM: LEFT FOOT - COMPLETE 3+ VIEW COMPARISON:  December 08, 2016 FINDINGS: Frontal, oblique, and lateral views obtained. There is mild soft tissue swelling in the mid to forefoot regions. There is no fracture or dislocation. There is narrowing of the first MTP joint as well as all PIP and DIP joints. No erosive change. No radiopaque foreign body evident. There are foci of atherosclerotic vascular calcification posterior tibial artery as well as apparent phleboliths in the ankle region. IMPRESSION: Soft tissue swelling. No fracture or dislocation. Narrowing of multiple distal joints. No radiopaque foreign body.  Areas of atherosclerotic vascular calcification noted. Electronically Signed   By: Lowella Grip III M.D.   On: 01/16/2017 11:01    Procedures Procedures (including critical care time)  Medications Ordered in UC Medications - No data to display   Initial Impression / Assessment and Plan / UC Course  I have reviewed the triage vital signs and the nursing notes.  Pertinent labs & imaging results that were available during my care of the patient were reviewed by me and considered in my medical decision making (see chart for details).    Patient presenting with L foot and ankle pain. No signs of foreign body on exam. Plain film of foot did not display foreign body either. Radiolucent body could still be present, however difficult to determine as no signs of entry and no specific areas or erythema, fluctuance, or pain on physical exam. L ankle swollen and TTP over anterotalofibular ligament, suggesting ankle sprain. Though patient with diabetes, no history of neuropathy and full sensation on exam, which is reassuring. Most likely explanation for pain is unrealized ankle trauma during initial incident with subsequent pain in both ankle and unusual presentation of pain in plantar surface. Will treat as sprain with ibuprofen, ice, elevation, and exercises, however patient instructed to return if signs of infection present or if no improvement by the end of the week.   Final Clinical Impressions(s) / UC Diagnoses   Final diagnoses:  Sprain of anterior talofibular ligament of left ankle, initial encounter    New Prescriptions Discharge Medication List as of 01/16/2017 11:19 AM      Adin Hector, MD, MPH PGY-3     Verner Mould, MD 01/16/17 1131

## 2017-01-16 NOTE — Telephone Encounter (Addendum)
Patient states advised she should be seen by PCP, ortho or urgent care. Patient verbalized understanding.

## 2017-01-16 NOTE — ED Triage Notes (Addendum)
Patient reports Sunday night her left bottom of foot started hurting, patient unable to put pressure on foot. Concerned for infection as she is a diabetic.   On exam patient states the middle of her foot, no swelling, deformity, or infection noted.

## 2017-01-16 NOTE — Discharge Instructions (Signed)
It was nice meeting you today Ms. Chrisley!  There were no abnormalities on your Xray, so your foot pain and ankle swelling is most likely due to an ankle sprain. Take ibuprofen 400mg  (two tablets) every 6 hours for the next couple days to help with inflammation. Elevating your foot and putting ice on it can also help bring down the swelling.   If you notice that your foot is beginning to turn red, or if you start to have discharge or drainage from your foot, please return to the clinic. If you are not getting better by the end of the week, please also return to our clinic.   If you have any questions or concerns, please feel free to call the clinic.   Be well,  Dr. 

## 2017-01-22 ENCOUNTER — Other Ambulatory Visit: Payer: Self-pay | Admitting: Rheumatology

## 2017-01-22 NOTE — Progress Notes (Signed)
Infusion orders are current for patient CBC CMP Tylenol Benadryl appointments are up to date and follow up appointment  is scheduled TB gold not due yet, due on May 2019 

## 2017-02-02 ENCOUNTER — Other Ambulatory Visit (HOSPITAL_COMMUNITY): Payer: Self-pay | Admitting: *Deleted

## 2017-02-05 ENCOUNTER — Encounter (HOSPITAL_COMMUNITY)
Admission: RE | Admit: 2017-02-05 | Discharge: 2017-02-05 | Disposition: A | Discharge status: Home / Self Care | Payer: Medicare Other | Source: Ambulatory Visit | Attending: Rheumatology | Admitting: Rheumatology

## 2017-02-05 DIAGNOSIS — M0579 Rheumatoid arthritis with rheumatoid factor of multiple sites without organ or systems involvement: Secondary | ICD-10-CM | POA: Diagnosis present

## 2017-02-05 LAB — COMPREHENSIVE METABOLIC PANEL
ALBUMIN: 3.7 g/dL (ref 3.5–5.0)
ALK PHOS: 62 U/L (ref 38–126)
ALT: 18 U/L (ref 14–54)
AST: 22 U/L (ref 15–41)
Anion gap: 8 (ref 5–15)
BILIRUBIN TOTAL: 0.3 mg/dL (ref 0.3–1.2)
BUN: 14 mg/dL (ref 6–20)
CALCIUM: 9.8 mg/dL (ref 8.9–10.3)
CO2: 32 mmol/L (ref 22–32)
CREATININE: 0.91 mg/dL (ref 0.44–1.00)
Chloride: 99 mmol/L — ABNORMAL LOW (ref 101–111)
GFR calc non Af Amer: >60 mL/min (ref >60)
GLUCOSE: 120 mg/dL — AB (ref 65–99)
Potassium: 3.3 mmol/L — ABNORMAL LOW (ref 3.5–5.1)
SODIUM: 139 mmol/L (ref 135–145)
Total Protein: 7.8 g/dL (ref 6.5–8.1)

## 2017-02-05 LAB — CBC WITH DIFFERENTIAL/PLATELET
Basophils Absolute: 0 10*3/uL (ref 0.0–0.1)
Basophils Relative: 0 %
EOS ABS: 0.4 10*3/uL (ref 0.0–0.7)
Eosinophils Relative: 6 %
HEMATOCRIT: 40.2 % (ref 36.0–46.0)
HEMOGLOBIN: 13.2 g/dL (ref 12.0–15.0)
LYMPHS ABS: 3.1 10*3/uL (ref 0.7–4.0)
Lymphocytes Relative: 52 %
MCH: 29.3 pg (ref 26.0–34.0)
MCHC: 32.8 g/dL (ref 30.0–36.0)
MCV: 89.1 fL (ref 78.0–100.0)
Monocytes Absolute: 0.4 10*3/uL (ref 0.1–1.0)
Monocytes Relative: 6 %
NEUTROS ABS: 2.1 10*3/uL (ref 1.7–7.7)
NEUTROS PCT: 36 %
Platelets: 374 10*3/uL (ref 150–400)
RBC: 4.51 MIL/uL (ref 3.87–5.11)
RDW: 13.6 % (ref 11.5–15.5)
WBC: 6 10*3/uL (ref 4.0–10.5)

## 2017-02-05 MED ORDER — ACETAMINOPHEN 325 MG PO TABS
650.0000 mg | ORAL_TABLET | ORAL | Status: DC
Start: 2017-02-05 — End: 2017-02-06

## 2017-02-05 MED ORDER — INFLIXIMAB 100 MG IV SOLR
6.0000 mg/kg | INTRAVENOUS | Status: DC
  Administered 2017-02-05: 500 mg via INTRAVENOUS
  Filled 2017-02-05: qty 50

## 2017-02-05 MED ORDER — DIPHENHYDRAMINE HCL 25 MG PO CAPS: 25.0000 mg | ORAL_CAPSULE | ORAL | Status: DC

## 2017-02-05 NOTE — Progress Notes (Signed)
Potassium is low. Please notify patient and fax results to her PCP

## 2017-02-06 ENCOUNTER — Telehealth: Payer: Self-pay | Admitting: Radiology

## 2017-02-06 NOTE — Telephone Encounter (Signed)
Called pt to advise of labs

## 2017-02-06 NOTE — Telephone Encounter (Signed)
-----   Message from Pollyann Savoy, MD sent at 02/05/2017 10:15 AM EDT ----- Potassium is low. Please notify patient and fax results to her PCP.

## 2017-03-19 ENCOUNTER — Emergency Department (HOSPITAL_COMMUNITY)
Admission: EM | Admit: 2017-03-19 | Discharge: 2017-03-19 | Disposition: A | Discharge status: Home / Self Care | Payer: No Typology Code available for payment source | Attending: Emergency Medicine | Admitting: Emergency Medicine

## 2017-03-19 ENCOUNTER — Encounter (HOSPITAL_COMMUNITY)
Admission: RE | Admit: 2017-03-19 | Discharge: 2017-03-19 | Disposition: A | Discharge status: Home / Self Care | Payer: Medicare Other | Source: Ambulatory Visit | Attending: Rheumatology | Admitting: Rheumatology

## 2017-03-19 ENCOUNTER — Other Ambulatory Visit: Payer: Self-pay

## 2017-03-19 ENCOUNTER — Emergency Department (HOSPITAL_COMMUNITY): Payer: No Typology Code available for payment source

## 2017-03-19 ENCOUNTER — Other Ambulatory Visit: Payer: Self-pay | Admitting: *Deleted

## 2017-03-19 ENCOUNTER — Encounter (HOSPITAL_COMMUNITY): Payer: Self-pay | Admitting: *Deleted

## 2017-03-19 DIAGNOSIS — Y9389 Activity, other specified: Secondary | ICD-10-CM | POA: Diagnosis not present

## 2017-03-19 DIAGNOSIS — M0579 Rheumatoid arthritis with rheumatoid factor of multiple sites without organ or systems involvement: Secondary | ICD-10-CM | POA: Diagnosis present

## 2017-03-19 DIAGNOSIS — I10 Essential (primary) hypertension: Secondary | ICD-10-CM | POA: Diagnosis not present

## 2017-03-19 DIAGNOSIS — Z91040 Latex allergy status: Secondary | ICD-10-CM | POA: Diagnosis not present

## 2017-03-19 DIAGNOSIS — M25532 Pain in left wrist: Secondary | ICD-10-CM

## 2017-03-19 DIAGNOSIS — M25562 Pain in left knee: Secondary | ICD-10-CM | POA: Diagnosis not present

## 2017-03-19 DIAGNOSIS — Z96659 Presence of unspecified artificial knee joint: Secondary | ICD-10-CM | POA: Diagnosis not present

## 2017-03-19 DIAGNOSIS — Z794 Long term (current) use of insulin: Secondary | ICD-10-CM | POA: Insufficient documentation

## 2017-03-19 DIAGNOSIS — E119 Type 2 diabetes mellitus without complications: Secondary | ICD-10-CM | POA: Diagnosis not present

## 2017-03-19 DIAGNOSIS — Y92410 Unspecified street and highway as the place of occurrence of the external cause: Secondary | ICD-10-CM | POA: Diagnosis not present

## 2017-03-19 DIAGNOSIS — Y998 Other external cause status: Secondary | ICD-10-CM | POA: Insufficient documentation

## 2017-03-19 DIAGNOSIS — Z79899 Other long term (current) drug therapy: Secondary | ICD-10-CM | POA: Insufficient documentation

## 2017-03-19 DIAGNOSIS — M545 Low back pain, unspecified: Secondary | ICD-10-CM

## 2017-03-19 MED ORDER — CYCLOBENZAPRINE HCL 5 MG PO TABS: 5.0000 mg | ORAL_TABLET | Freq: Every evening | ORAL | 0 refills | Status: DC | PRN

## 2017-03-19 MED ORDER — SODIUM CHLORIDE 0.9 % IV SOLN
6.0000 mg/kg | INTRAVENOUS | Status: DC
  Administered 2017-03-19: 500 mg via INTRAVENOUS
  Filled 2017-03-19: qty 50

## 2017-03-19 MED ORDER — ACETAMINOPHEN 325 MG PO TABS
650.0000 mg | ORAL_TABLET | ORAL | Status: DC
  Administered 2017-03-19: 650 mg via ORAL

## 2017-03-19 MED ORDER — KETOROLAC TROMETHAMINE 30 MG/ML IJ SOLN
15.0000 mg | Freq: Once | INTRAMUSCULAR | Status: AC
  Administered 2017-03-19: 15 mg via INTRAVENOUS
  Filled 2017-03-19: qty 1

## 2017-03-19 MED ORDER — ACETAMINOPHEN 325 MG PO TABS
ORAL_TABLET | ORAL | Status: AC
  Filled 2017-03-19: qty 2

## 2017-03-19 MED ORDER — DIPHENHYDRAMINE HCL 25 MG PO CAPS
ORAL_CAPSULE | ORAL | Status: AC
  Filled 2017-03-19: qty 1

## 2017-03-19 MED ORDER — DIPHENHYDRAMINE HCL 25 MG PO CAPS
25.0000 mg | ORAL_CAPSULE | ORAL | Status: DC
  Administered 2017-03-19: 25 mg via ORAL
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
## 2017-03-19 NOTE — Discharge Instructions (Signed)
Please take Ibuprofen (Advil, motrin) and Tylenol (acetaminophen) to relieve your pain.  You may take up to 600 MG (3 pills) of normal strength ibuprofen every 8 hours as needed.  In between doses of ibuprofen you make take tylenol, up to 1,000 mg (two extra strength pills).  Do not take more than 3,000 mg tylenol in a 24 hour period.  Please check all medication labels as many medications such as pain and cold medications may contain tylenol.  Do not drink alcohol while taking these medications.  Do not take other NSAID'S while taking ibuprofen (such as aleve or naproxen).  Please take ibuprofen with food to decrease stomach upset.  You are being prescribed a medication which may make you sleepy (flexeril) . For 24 hours after one dose please do not drive, operate heavy machinery, care for a small child with out another adult present, or perform any activities that may cause harm to you or someone else if you were to fall asleep or be impaired.   The best way to get rid of muscle pain is by taking NSAIDS, using heat, massage therapy, and gentle stretching/range of motion exercises.  Please follow up with your primary care doctor.
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
## 2017-04-05 ENCOUNTER — Telehealth: Payer: Self-pay

## 2017-04-05 NOTE — Telephone Encounter (Signed)
Called the patient to verify benefits for 2019. Pt currently received infusions that may require a pre-certification. Spoke with patient who states that she will continue to have [employer] Medicare (Dual medicaid). Will submit an anuual reverification to [employer] and update once we received a response.   Alfretta Pinch, [place], CPhT 2:17 PM

## 2017-04-05 NOTE — Telephone Encounter (Signed)
Opened in error

## 2017-04-09 ENCOUNTER — Ambulatory Visit: Payer: Self-pay | Admitting: Rheumatology
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
## 2017-04-10 ENCOUNTER — Ambulatory Visit (INDEPENDENT_AMBULATORY_CARE_PROVIDER_SITE_OTHER): Payer: Medicare Other | Admitting: Rheumatology

## 2017-04-10 ENCOUNTER — Encounter: Payer: Self-pay | Admitting: Rheumatology

## 2017-04-10 VITALS — BP 156/81 | HR 65 | Resp 17 | Ht 64.0 in | Wt 182.0 lb

## 2017-04-10 DIAGNOSIS — M5136 Other intervertebral disc degeneration, lumbar region: Secondary | ICD-10-CM

## 2017-04-10 DIAGNOSIS — M5030 Other cervical disc degeneration, unspecified cervical region: Secondary | ICD-10-CM | POA: Diagnosis not present

## 2017-04-10 DIAGNOSIS — H15009 Unspecified scleritis, unspecified eye: Secondary | ICD-10-CM

## 2017-04-10 DIAGNOSIS — Z79899 Other long term (current) drug therapy: Secondary | ICD-10-CM

## 2017-04-10 DIAGNOSIS — M19042 Primary osteoarthritis, left hand: Secondary | ICD-10-CM

## 2017-04-10 DIAGNOSIS — Z8679 Personal history of other diseases of the circulatory system: Secondary | ICD-10-CM | POA: Diagnosis not present

## 2017-04-10 DIAGNOSIS — M0579 Rheumatoid arthritis with rheumatoid factor of multiple sites without organ or systems involvement: Secondary | ICD-10-CM | POA: Diagnosis not present

## 2017-04-10 DIAGNOSIS — Z8639 Personal history of other endocrine, nutritional and metabolic disease: Secondary | ICD-10-CM

## 2017-04-10 DIAGNOSIS — M19041 Primary osteoarthritis, right hand: Secondary | ICD-10-CM

## 2017-04-30 ENCOUNTER — Ambulatory Visit (HOSPITAL_COMMUNITY): Admission: RE | Admit: 2017-04-30 | Payer: No Typology Code available for payment source | Source: Ambulatory Visit

## 2017-05-09 ENCOUNTER — Other Ambulatory Visit: Payer: Self-pay | Admitting: *Deleted

## 2017-05-09 NOTE — Progress Notes (Signed)
Infusion orders are current for patient CBC CMP Tylenol Benadryl appointments are up to date and follow up appointment  is scheduled TB gold not due yet.  

## 2017-05-11 ENCOUNTER — Telehealth: Payer: Self-pay | Admitting: Rheumatology

## 2017-05-11 NOTE — Telephone Encounter (Signed)
Wendy Woods calling to get prior authorization for patients infusion. Please call to discuss.

## 2017-05-14 ENCOUNTER — Ambulatory Visit (HOSPITAL_COMMUNITY)
Admission: RE | Admit: 2017-05-14 | Discharge: 2017-05-14 | Disposition: A | Discharge status: Home / Self Care | Payer: Medicare Other | Source: Ambulatory Visit | Attending: Rheumatology | Admitting: Rheumatology

## 2017-05-14 DIAGNOSIS — M0579 Rheumatoid arthritis with rheumatoid factor of multiple sites without organ or systems involvement: Secondary | ICD-10-CM

## 2017-05-14 LAB — CBC
HCT: 40 % (ref 36.0–46.0)
Hemoglobin: 13.3 g/dL (ref 12.0–15.0)
MCH: 29.8 pg (ref 26.0–34.0)
MCHC: 33.3 g/dL (ref 30.0–36.0)
MCV: 89.7 fL (ref 78.0–100.0)
PLATELETS: 364 10*3/uL (ref 150–400)
RBC: 4.46 MIL/uL (ref 3.87–5.11)
RDW: 13.7 % (ref 11.5–15.5)
WBC: 6.3 10*3/uL (ref 4.0–10.5)

## 2017-05-14 MED ORDER — SODIUM CHLORIDE 0.9 % IV SOLN
6.0000 mg/kg | INTRAVENOUS | Status: DC
  Administered 2017-05-14: 500 mg via INTRAVENOUS
  Filled 2017-05-14: qty 50

## 2017-05-14 MED ORDER — DIPHENHYDRAMINE HCL 25 MG PO CAPS
25.0000 mg | ORAL_CAPSULE | ORAL | Status: DC
  Administered 2017-05-14: 25 mg via ORAL

## 2017-05-14 MED ORDER — ACETAMINOPHEN 325 MG PO TABS
ORAL_TABLET | ORAL | Status: AC
  Administered 2017-05-14: 650 mg via ORAL
  Filled 2017-05-14: qty 2

## 2017-05-14 MED ORDER — DIPHENHYDRAMINE HCL 25 MG PO CAPS
ORAL_CAPSULE | ORAL | Status: AC
  Administered 2017-05-14: 25 mg via ORAL
  Filled 2017-05-14: qty 1

## 2017-05-14 MED ORDER — ACETAMINOPHEN 325 MG PO TABS
650.0000 mg | ORAL_TABLET | ORAL | Status: DC
  Administered 2017-05-14: 650 mg via ORAL

## 2017-05-14 NOTE — Telephone Encounter (Signed)
Called pts insurance to check status of Remicaid per-certification. Spoke with Wendy Woods who states that a pre-cert is not required.   Returned Wendy Woods's call to give her an update. Left message.   Wendy Woods, [place], CPhT 11:30 AM

## 2017-05-14 NOTE — Progress Notes (Signed)
Within normal limits

## 2017-05-20 ENCOUNTER — Encounter (HOSPITAL_COMMUNITY): Payer: Self-pay | Admitting: *Deleted

## 2017-05-20 ENCOUNTER — Ambulatory Visit (HOSPITAL_COMMUNITY)
Admission: EM | Admit: 2017-05-20 | Discharge: 2017-05-20 | Disposition: A | Discharge status: Home / Self Care | Payer: Medicare Other | Attending: Internal Medicine | Admitting: Internal Medicine

## 2017-05-20 ENCOUNTER — Other Ambulatory Visit: Payer: Self-pay

## 2017-05-20 DIAGNOSIS — Z79899 Other long term (current) drug therapy: Secondary | ICD-10-CM

## 2017-05-20 DIAGNOSIS — R5381 Other malaise: Secondary | ICD-10-CM | POA: Diagnosis present

## 2017-05-20 DIAGNOSIS — J069 Acute upper respiratory infection, unspecified: Secondary | ICD-10-CM

## 2017-05-20 DIAGNOSIS — J029 Acute pharyngitis, unspecified: Secondary | ICD-10-CM | POA: Diagnosis present

## 2017-05-20 DIAGNOSIS — R0781 Pleurodynia: Secondary | ICD-10-CM | POA: Diagnosis present

## 2017-05-20 MED ORDER — PSEUDOEPHEDRINE HCL 60 MG PO TABS: 60.0000 mg | ORAL_TABLET | Freq: Three times a day (TID) | ORAL | 0 refills | Status: DC | PRN

## 2017-05-20 MED ORDER — AZITHROMYCIN 250 MG PO TABS: ORAL_TABLET | ORAL | 0 refills | Status: DC

## 2017-05-20 MED ORDER — CETIRIZINE HCL 10 MG PO TABS: 10.0000 mg | ORAL_TABLET | Freq: Every day | ORAL | 11 refills | Status: AC

## 2017-05-20 NOTE — ED Triage Notes (Signed)
Headache, nasal drainage, sore throat started yesterday.

## 2017-05-20 NOTE — Discharge Instructions (Signed)
For sore throat try using a honey-based tea. Use 3 teaspoons of honey with juice squeezed from half lemon. Place shaved pieces of ginger into 1/2-1 cup of water and warm over stove top. Then mix the ingredients and repeat every 4 hours as needed. If you have no improvement in 3-4 days with Zyrtec, Sudafed, hydrating well and rest, then fill the scrip for azithromycin to address possible bacterial infection. If you develop chest pain, shortness, fevers then you need to come back so that we can re-evaluate you. Otherwise, just check back with your PCP.

## 2017-05-20 NOTE — ED Provider Notes (Signed)
  MRN: [id number] DOB: 1960/07/15  Subjective:   Wendy Woods is a 57 y.o. female presenting for 1 day history of frontal headache, nasal congestion, mild chest congestion, rib pain, sore throat, malaise. Has tried APAP, Alka-seltzer cold/plus. Has tried to drink warm liquids, teas. Denies cough, shob, n/v, abdominal pain, ear pain, ear drainage. Has allergies, does not take anything for this. Denies smoking cigarettes.  Patient is currently on Invokana, metformin. She also takes Remicade for RA, last treatment was 05/14/2017.   Wendy Woods is allergic to latex; lisinopril; and penicillins.  Wendy Woods  has a past medical history of DDD (degenerative disc disease), cervical (03/22/2016), DDD (degenerative disc disease), lumbar (03/22/2016), GERD (gastroesophageal reflux disease), Hyperlipidemia, Hypertension, Rheumatoid arthritis(714.0), Scleritis (03/22/2016), and Type II diabetes mellitus (HCC). Also  has a past surgical history that includes Cataract extraction w/ intraocular lens  implant, bilateral (Bilateral, 11/2013-12/2013); Appendectomy (1968); Myomectomy (1990's); Knee arthroscopy (Left, [phone] X 3); and Knee Arthroplasty.  Objective:   Vitals: BP 134/64 (BP Location: Left Arm)   Pulse 63   Temp 98 F (36.7 C) (Oral)   LMP 07/09/2014 Comment: "irregular"  SpO2 100%   Physical Exam  Constitutional: She is oriented to person, place, and time. She appears well-developed and well-nourished.  HENT:  TM's intact bilaterally, no effusions or erythema. Nasal turbinates pink, dry, nasal passages patent. Maxillary sinus tenderness. Oropharynx with mild post-nasal drainage, mucous membranes moist.   Eyes: Right eye exhibits no discharge. Left eye exhibits no discharge.  Neck: Normal range of motion. Neck supple.  Cardiovascular: Normal rate, regular rhythm and intact distal pulses. Exam reveals no gallop and no friction rub.  No murmur heard. Pulmonary/Chest: No respiratory distress. She has no  wheezes. She has no rales. She exhibits no tenderness.  Lymphadenopathy:    She has cervical adenopathy.  Neurological: She is alert and oriented to person, place, and time.  Skin: Skin is warm and dry.  Psychiatric: She has a normal mood and affect.   Assessment and Plan :   Upper respiratory tract infection, unspecified type  Sore throat  Malaise  Immunosuppression due to drug therapy  Will start azithromycin given allergy to penicillin to address respiratory infection given immunosuppression. She is unable to take doxycycline due to interactions with her medications. Return-to-clinic precautions discussed, patient verbalized understanding.    Wallis Bamberg, PA-C 05/20/17 2228

## 2017-05-21 LAB — POCT RAPID STREP A: STREPTOCOCCUS, GROUP A SCREEN (DIRECT): NEGATIVE

## 2017-05-23 LAB — CULTURE, GROUP A STREP (THRC)

## 2017-05-31 ENCOUNTER — Other Ambulatory Visit: Payer: Self-pay | Admitting: Internal Medicine

## 2017-05-31 DIAGNOSIS — Z1231 Encounter for screening mammogram for malignant neoplasm of breast: Secondary | ICD-10-CM

## 2017-06-08 ENCOUNTER — Telehealth: Payer: Self-pay | Admitting: Rheumatology

## 2017-06-08 ENCOUNTER — Other Ambulatory Visit: Payer: Self-pay | Admitting: *Deleted

## 2017-06-08 NOTE — Progress Notes (Signed)
Infusion orders are current for patient CBC CMP Tylenol Benadryl appointments are up to date and follow up appointment  is scheduled TB gold not due yet.  

## 2017-06-08 NOTE — Telephone Encounter (Signed)
Wendy Woods from [employer] left a voicemail stating that they do not have a business associate agreement on file for this patient.  A fax will be sent today to be filled out.  Their fax # [phone]  Portal address:  [email or web address]

## 2017-06-14 ENCOUNTER — Ambulatory Visit (HOSPITAL_COMMUNITY): Admission: RE | Admit: 2017-06-14 | Payer: Medicare Other | Source: Ambulatory Visit

## 2017-07-02 ENCOUNTER — Other Ambulatory Visit: Payer: Self-pay | Admitting: *Deleted

## 2017-07-02 NOTE — Progress Notes (Signed)
Infusion orders are current for patient CBC CMP Tylenol Benadryl appointments are up to date and follow up appointment  is scheduled TB gold not due yet.  

## 2017-07-03 ENCOUNTER — Ambulatory Visit (HOSPITAL_COMMUNITY)
Admission: RE | Admit: 2017-07-03 | Discharge: 2017-07-03 | Disposition: A | Discharge status: Home / Self Care | Payer: Medicare Other | Source: Ambulatory Visit | Attending: Rheumatology | Admitting: Rheumatology

## 2017-07-03 DIAGNOSIS — M0579 Rheumatoid arthritis with rheumatoid factor of multiple sites without organ or systems involvement: Secondary | ICD-10-CM | POA: Insufficient documentation

## 2017-07-03 LAB — COMPREHENSIVE METABOLIC PANEL
ALT: 19 U/L (ref 14–54)
AST: 23 U/L (ref 15–41)
Albumin: 3.5 g/dL (ref 3.5–5.0)
Alkaline Phosphatase: 72 U/L (ref 38–126)
Anion gap: 9 (ref 5–15)
BUN: 12 mg/dL (ref 6–20)
CO2: 27 mmol/L (ref 22–32)
Calcium: 9.8 mg/dL (ref 8.9–10.3)
Chloride: 102 mmol/L (ref 101–111)
Creatinine, Ser: 0.99 mg/dL (ref 0.44–1.00)
GFR calc Af Amer: >60 mL/min (ref >60)
GFR calc non Af Amer: >60 mL/min (ref >60)
Glucose, Bld: 124 mg/dL — ABNORMAL HIGH (ref 65–99)
Potassium: 3.7 mmol/L (ref 3.5–5.1)
Sodium: 138 mmol/L (ref 135–145)
Total Bilirubin: 0.3 mg/dL (ref 0.3–1.2)
Total Protein: 7.6 g/dL (ref 6.5–8.1)

## 2017-07-03 LAB — CBC
HEMATOCRIT: 40 % (ref 36.0–46.0)
HEMOGLOBIN: 13.3 g/dL (ref 12.0–15.0)
MCH: 29.9 pg (ref 26.0–34.0)
MCHC: 33.3 g/dL (ref 30.0–36.0)
MCV: 89.9 fL (ref 78.0–100.0)
Platelets: 385 10*3/uL (ref 150–400)
RBC: 4.45 MIL/uL (ref 3.87–5.11)
RDW: 13.5 % (ref 11.5–15.5)
WBC: 6.5 10*3/uL (ref 4.0–10.5)

## 2017-07-03 MED ORDER — DIPHENHYDRAMINE HCL 25 MG PO CAPS
ORAL_CAPSULE | ORAL | Status: AC
  Filled 2017-07-03: qty 1

## 2017-07-03 MED ORDER — DIPHENHYDRAMINE HCL 25 MG PO CAPS
25.0000 mg | ORAL_CAPSULE | ORAL | Status: DC
  Administered 2017-07-03: 25 mg via ORAL

## 2017-07-03 MED ORDER — SODIUM CHLORIDE 0.9 % IV SOLN
6.0000 mg/kg | INTRAVENOUS | Status: DC
  Administered 2017-07-03: 500 mg via INTRAVENOUS
  Filled 2017-07-03: qty 50

## 2017-07-03 MED ORDER — ACETAMINOPHEN 325 MG PO TABS
ORAL_TABLET | ORAL | Status: AC
  Filled 2017-07-03: qty 2

## 2017-07-03 MED ORDER — ACETAMINOPHEN 325 MG PO TABS
650.0000 mg | ORAL_TABLET | ORAL | Status: DC
  Administered 2017-07-03: 650 mg via ORAL

## 2017-07-03 NOTE — Progress Notes (Signed)
stable °

## 2017-07-16 ENCOUNTER — Ambulatory Visit
Admission: RE | Admit: 2017-07-16 | Discharge: 2017-07-16 | Disposition: A | Discharge status: Home / Self Care | Payer: Medicare Other | Source: Ambulatory Visit | Attending: Internal Medicine | Admitting: Internal Medicine

## 2017-07-16 DIAGNOSIS — Z1231 Encounter for screening mammogram for malignant neoplasm of breast: Secondary | ICD-10-CM

## 2017-07-17 ENCOUNTER — Other Ambulatory Visit: Payer: Self-pay | Admitting: Internal Medicine

## 2017-07-17 DIAGNOSIS — R928 Other abnormal and inconclusive findings on diagnostic imaging of breast: Secondary | ICD-10-CM

## 2017-07-23 ENCOUNTER — Encounter (HOSPITAL_COMMUNITY): Payer: Self-pay

## 2017-07-23 ENCOUNTER — Ambulatory Visit
Admission: RE | Admit: 2017-07-23 | Discharge: 2017-07-23 | Disposition: A | Discharge status: Home / Self Care | Payer: Medicare Other | Source: Ambulatory Visit | Attending: Internal Medicine | Admitting: Internal Medicine

## 2017-07-23 ENCOUNTER — Ambulatory Visit: Payer: Self-pay

## 2017-07-23 DIAGNOSIS — R928 Other abnormal and inconclusive findings on diagnostic imaging of breast: Secondary | ICD-10-CM

## 2017-08-01 NOTE — Progress Notes (Signed)
Office Visit Note  Patient: Wendy Woods             Date of Birth: July 15, 1960           MRN: [id number]             PCP: Fleet Contras, MD Referring: Fleet Contras, MD Visit Date: 08/06/2017 Occupation: @GUAROCC @    Subjective:  Left knee pain    History of Present Illness: Wendy Woods is a 57 y.o. female with history of seropositive rheumatoid arthritis, osteoarthritis, and degenerative disc disease of the spine.  Patient states that she continues to get her Remicade infusions every 6 weeks and takes OTREXUP 20 mg weekly and folic acid 2 mg daily.  She denies any recent flares of her rheumatoid arthritis.  She states she continues to get labs with her infusions.  She states that she has some occasional discomfort in her neck and lower back.  She states that she is having pain and swelling in her left knee.  She states that the pain has been worsening over the past week.  She states that she is having some locking and catching sensation in her left knee.  She states that is also been giving way.  She states that she has recently started to gain more weight and attributes some of her pain due to the weight gain.  She denies any other joint swelling.  She states that she has pain of left trochanteric bursa.  She reports she sleeps on her left side at night.  She states she continues to have a ganglion cyst on her left wrist.  She states she experiences pain with range of motion of her left wrist and if she types for prolonged periods of time.  She reports her eyes have been itchy and dry lately.  She denies any redness.    Activities of Daily Living:  Patient reports morning stiffness for 5 minutes.   Patient Denies nocturnal pain.  Difficulty dressing/grooming: Denies Difficulty climbing stairs: Reports Difficulty getting out of chair: Reports Difficulty using hands for taps, buttons, cutlery, and/or writing: Denies   Review of Systems  Constitutional: Positive for fatigue.  HENT:  Negative for mouth sores, trouble swallowing, trouble swallowing, mouth dryness and nose dryness.   Eyes: Positive for itching and dryness. Negative for pain and visual disturbance.  Respiratory: Negative for cough, hemoptysis, shortness of breath and difficulty breathing.   Cardiovascular: Negative for chest pain, palpitations, hypertension and swelling in legs/feet.  Gastrointestinal: Negative for blood in stool, constipation and diarrhea.  Endocrine: Negative for increased urination.  Genitourinary: Negative for painful urination.  Musculoskeletal: Negative for arthralgias, joint pain, joint swelling, myalgias, muscle weakness, morning stiffness, muscle tenderness and myalgias.  Skin: Negative for color change, pallor, rash, hair loss, nodules/bumps, skin tightness, ulcers and sensitivity to sunlight.  Allergic/Immunologic: Negative for susceptible to infections.  Neurological: Negative for dizziness, numbness, headaches and weakness.  Hematological: Negative for swollen glands.  Psychiatric/Behavioral: Negative for depressed mood and sleep disturbance. The patient is not nervous/anxious.     PMFS History:  Patient Active Problem List   Diagnosis Date Noted  . DDD (degenerative disc disease), cervical 03/22/2016  . DDD (degenerative disc disease), lumbar 03/22/2016  . 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 Angioedema 07/15/2014  . Hypokalemia 07/15/2014  . Diabetes mellitus, controlled (HCC) 07/03/2012  . HTN (hypertension) 07/03/2012  . Rheumatoid arthritis (HCC) 07/03/2012  . Current chronic use of systemic steroids 07/03/2012  . 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 07/03/2012  Past Medical History:  Diagnosis Date  . DDD (degenerative disc disease), cervical 03/22/2016  . DDD (degenerative disc disease), lumbar 03/22/2016  . GERD (gastroesophageal reflux disease)   . Hyperlipidemia   . Hypertension   . Rheumatoid arthritis(714.0)   . 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 Type II diabetes mellitus (HCC)       Family History  Problem Relation Age of Onset  . Colon polyps Father   . Prostate cancer Father   . Breast cancer Cousin 26  . Colon cancer Neg Hx    Past Surgical History:  Procedure Laterality Date  . APPENDECTOMY  1968  . CATARACT EXTRACTION W/ INTRAOCULAR LENS  IMPLANT, BILATERAL Bilateral 11/2013-12/2013  . KNEE ARTHROPLASTY    . KNEE ARTHROSCOPY Left 1980's-1990's X 3  . MYOMECTOMY  1990's   Social History   Social History Narrative  . Not on file     Objective: Vital Signs: BP 126/72 (BP Location: Left Arm, Patient Position: Sitting, Cuff Size: Normal)   Pulse 84   Resp 15   Ht 5\' 4"  (1.626 m)   Wt 185 lb (83.9 kg)   LMP 07/09/2014   BMI 31.76 kg/m    Physical Exam  Constitutional: She is oriented to person, place, and time. She appears well-developed and well-nourished.  HENT:  Head: Normocephalic and atraumatic.  Eyes: Conjunctivae and EOM are normal.  Neck: Normal range of motion.  Cardiovascular: Normal rate, regular rhythm, normal heart sounds and intact distal pulses.  Pulmonary/Chest: Effort normal and breath sounds normal.  Abdominal: Soft. Bowel sounds are normal.  Lymphadenopathy:    She has no cervical adenopathy.  Neurological: She is alert and oriented to person, place, and time.  Skin: Skin is warm and dry. Capillary refill takes less than 2 seconds.  Psychiatric: She has a normal mood and affect. Her behavior is normal.  Nursing note and vitals reviewed.    Musculoskeletal Exam: She has limited range of motion of her C-spine with some discomfort. Thoracic and lumbar spine good ROM. No midline spinal tenderness.  No SI joint tenderness.  Shoulder joints, elbow joints, wrist joints, MCPs, PIPs, DIPs good range of motion with no synovitis.  Ganglion cyst of left wrist. Hip joints, knee joints, ankle joints, MTPs, PIPs, and DIPs good ROM with no synovitis. Tenderness of left trochanteric bursa. No warmth or effusion of bilateral knees.  She has  tenderness along the lateral joint line of left knee.      CDAI Exam: CDAI Homunculus Exam:   Tenderness:  LLE: tibiofemoral  Joint Counts:  CDAI Tender Joint count: 1 CDAI Swollen Joint count: 0  Global Assessments:  Patient Global Assessment: 7   CDAI Calculated Score: 8    Investigation: No additional findings. CBC Latest Ref Rng & Units 07/03/2017 05/14/2017 02/05/2017  WBC 4.0 - 10.5 K/uL 6.5 6.3 6.0  Hemoglobin 12.0 - 15.0 g/dL 04/07/2017 14.4 81.8  Hematocrit 36.0 - 46.0 % 40.0 40.0 40.2  Platelets 150 - 400 K/uL 385 364 374   CMP Latest Ref Rng & Units 07/03/2017 02/05/2017 11/13/2016  Glucose 65 - 99 mg/dL 11/15/2016) 149(F) 026(V)  BUN 6 - 20 mg/dL 12 14 13   Creatinine 0.44 - 1.00 mg/dL 785(Y 8.50)  Sodium 135 - 145 mmol/L 138 139 137  Potassium 3.5 - 5.1 mmol/L 3.7 3.3(L) 3.1(L)  Chloride 101 - 111 mmol/L 102 99(L) 99(L)  CO2 22 - 32 mmol/L 27 32 29  Calcium 8.9 - 10.3 mg/dL 9.8 9.8 9.7  Total  Protein 6.5 - 8.1 g/dL 7.6 7.8 7.4  Total Bilirubin 0.3 - 1.2 mg/dL 0.3 0.3 0.6  Alkaline Phos 38 - 126 U/L 72 62 57  AST 15 - 41 U/L 23 22 27   ALT 14 - 54 U/L 19 18 20     Imaging: Mm Diag Breast Tomo Uni Right  Result Date: 07/23/2017 CLINICAL DATA:  Patient was called back from screening mammogram for possible distortion in the right breast. EXAM: DIGITAL DIAGNOSTIC RIGHT MAMMOGRAM WITH TOMO COMPARISON:  Previous exam(s). ACR Breast Density Category b: There are scattered areas of fibroglandular density. FINDINGS: Additional imaging of the right breast was performed. No persistent mass, distortion or malignant type microcalcifications identified. IMPRESSION: No evidence of malignancy in the right breast. RECOMMENDATION: Bilateral screening mammogram in 1 year is recommended. I have discussed the findings and recommendations with the patient. Results were also provided in writing at the conclusion of the visit. If applicable, a reminder letter will be sent to the patient regarding  the next appointment. BI-RADS CATEGORY  1: Negative. Electronically Signed   By: Baird Lyons M.D.   On: 07/23/2017 15:07   Mm Screening Breast Tomo Bilateral  Result Date: 07/16/2017 CLINICAL DATA:  Screening. EXAM: DIGITAL SCREENING BILATERAL MAMMOGRAM WITH TOMO AND CAD COMPARISON:  Previous exam(s). ACR Breast Density Category c: The breast tissue is heterogeneously dense, which may obscure small masses. FINDINGS: In the right breast, possible distortion warrants further evaluation. In the left breast, no findings suspicious for malignancy. Images were processed with CAD. IMPRESSION: Further evaluation is suggested for possible distortion in the right breast. RECOMMENDATION: Diagnostic mammogram and possibly ultrasound of the right breast. (Code:[id number]) The patient will be contacted regarding the findings, and additional imaging will be scheduled. BI-RADS CATEGORY  0: Incomplete. Need additional imaging evaluation and/or prior mammograms for comparison. Electronically Signed   By: Hulan Saas M.D.   On: 07/16/2017 11:33    Speciality Comments: Remicade 6mg / kg every 6 weeks TB gold negative 08/2016    Procedures:  No procedures performed Allergies: Latex; Lisinopril; and Penicillins   Assessment / Plan:     Visit Diagnoses: Rheumatoid arthritis involving multiple sites with positive rheumatoid factor (HCC) - + RF, Anti-CCP +, ANA -: She has not had any recent flares of rheumatoid arthritis.  She continues to get Remicade infusions every 6 weeks. Otrexup 20 mg weekly, and folic acid 2 mg daily.  She feels that her rheumatoid arthritis has been very well controlled on his current treatment regimen.  She has no pain in her hands or feet at this time.  She has no synovitis on exam today.    High risk medication use - Remicade 6 mg/kg every 6 weeks, Otrexup 20 mg every week, folic acid 2 mg by mouth daily.  She has labs performed with her infusion.  Her last labs on 07/03/2017 revealed elevated  glucose labs are within normal limits.  DDD (degenerative disc disease), cervical: She has some limited range of motion of her C-spine with discomfort.  DDD (degenerative disc disease), lumbar: No midline spinal tenderness.  She experiences occasional discomfort.    Primary osteoarthritis of both hands: No discomfort at this time.  No synovitis noted.  Joint protection and muscle strengthening were discussed.    Trochanteric bursitis, left hip: She has tenderness of left trochanteric bursa on exam.  She declined a cortisone injection today in the office.  She was given a handout of exercises that she can perform at home.   Chronic  pain of left knee: She has no warmth or effusion on exam.  She has tenderness along the lateral joint line.  She has been experiencing mechanical symptoms of catching and locking.  She previously had two arthroscopic surgeries of her left knee about 20 years ago.  She feels her symptoms are similar to before her surgeries in the past.   She was given a handout of knee exercises.  She declined a cortisone injection today.  Previous cortisone injections caused hypopigmentation of the skin. She also has a history of diabetes and would not like cortisone to raise her blood glucose level. She had an x-ray of her left knee on 03/19/17 that revealed tricompartmental degenerative changes. She would like to move forward with a MRI of left knee today. We will place the order for a MRI today.  She does not have a history of claustrophobia or metal implants.   Scleritis, unspecified laterality: She reports eye dryness and itching.  She has no eye redness.  She was advised to follow up with her ophthalmologist if she continues to have worsening symptoms.    Ganglion cyst of dorsum of left wrist: She has tenderness with palpation of the cyst.  We discussed treatment options.  She would not like to move forward with an aspiration or a referral to Orthopedic surgery for removal of the cyst.     Other medical conditions are listed as follows:   History of diabetes mellitus, type II  Essential hypertension    Orders: No orders of the defined types were placed in this encounter.  No orders of the defined types were placed in this encounter.   Face-to-face time spent with patient was 30 minutes. >50% of time was spent in counseling and coordination of care.  Follow-Up Instructions: Return in about 5 months (around 01/06/2018) for Rheumatoid arthritis, DDD.   Gearldine Bienenstock, PA-C I examined and evaluated the patient with Sherron Ales PA. The plan of care was discussed as noted above.  Pollyann Savoy, MD Note - This record has been created using Animal nutritionist.  Chart creation errors have been sought, but may not always  have been located. Such creation errors do not reflect on  the standard of medical care.

## 2017-08-06 ENCOUNTER — Ambulatory Visit (INDEPENDENT_AMBULATORY_CARE_PROVIDER_SITE_OTHER): Payer: Medicare Other | Admitting: Rheumatology

## 2017-08-06 ENCOUNTER — Encounter: Payer: Self-pay | Admitting: Physician Assistant

## 2017-08-06 VITALS — BP 126/72 | HR 84 | Resp 15 | Ht 64.0 in | Wt 185.0 lb

## 2017-08-06 DIAGNOSIS — M67432 Ganglion, left wrist: Secondary | ICD-10-CM

## 2017-08-06 DIAGNOSIS — Z79899 Other long term (current) drug therapy: Secondary | ICD-10-CM

## 2017-08-06 DIAGNOSIS — M19042 Primary osteoarthritis, left hand: Secondary | ICD-10-CM | POA: Diagnosis not present

## 2017-08-06 DIAGNOSIS — M19041 Primary osteoarthritis, right hand: Secondary | ICD-10-CM | POA: Diagnosis not present

## 2017-08-06 DIAGNOSIS — M5030 Other cervical disc degeneration, unspecified cervical region: Secondary | ICD-10-CM

## 2017-08-06 DIAGNOSIS — Z8639 Personal history of other endocrine, nutritional and metabolic disease: Secondary | ICD-10-CM | POA: Diagnosis not present

## 2017-08-06 DIAGNOSIS — M5136 Other intervertebral disc degeneration, lumbar region: Secondary | ICD-10-CM

## 2017-08-06 DIAGNOSIS — M7062 Trochanteric bursitis, left hip: Secondary | ICD-10-CM | POA: Diagnosis not present

## 2017-08-06 DIAGNOSIS — M25562 Pain in left knee: Secondary | ICD-10-CM | POA: Diagnosis not present

## 2017-08-06 DIAGNOSIS — I10 Essential (primary) hypertension: Secondary | ICD-10-CM | POA: Diagnosis not present

## 2017-08-06 DIAGNOSIS — G8929 Other chronic pain: Secondary | ICD-10-CM

## 2017-08-06 DIAGNOSIS — H15009 Unspecified scleritis, unspecified eye: Secondary | ICD-10-CM

## 2017-08-06 DIAGNOSIS — M51369 Other intervertebral disc degeneration, lumbar region without mention of lumbar back pain or lower extremity pain: Secondary | ICD-10-CM

## 2017-08-06 DIAGNOSIS — M0579 Rheumatoid arthritis with rheumatoid factor of multiple sites without organ or systems involvement: Secondary | ICD-10-CM | POA: Diagnosis not present

## 2017-08-06 NOTE — Addendum Note (Signed)
Addended by: Ellen Henri on: 08/06/2017 03:53 PM   Modules accepted: Orders

## 2017-08-06 NOTE — Patient Instructions (Addendum)
Knee Exercises Ask your health care provider which exercises are safe for you. Do exercises exactly as told by your health care provider and adjust them as directed. It is normal to feel mild stretching, pulling, tightness, or discomfort as you do these exercises, but you should stop right away if you feel sudden pain or your pain gets worse.Do not begin these exercises until told by your health care provider. STRETCHING AND RANGE OF MOTION EXERCISES These exercises warm up your muscles and joints and improve the movement and flexibility of your knee. These exercises also help to relieve pain, numbness, and tingling. Exercise A: Knee Extension, Prone 1. Lie on your abdomen on a bed. 2. Place your left / right knee just beyond the edge of the surface so your knee is not on the bed. You can put a towel under your left / right thigh just above your knee for comfort. 3. Relax your leg muscles and allow gravity to straighten your knee. You should feel a stretch behind your left / right knee. 4. Hold this position for __________ seconds. 5. Scoot up so your knee is supported between repetitions. Repeat __________ times. Complete this stretch __________ times a day. Exercise B: Knee Flexion, Active  1. Lie on your back with both knees straight. If this causes back discomfort, bend your left / right knee so your foot is flat on the floor. 2. Slowly slide your left / right heel back toward your buttocks until you feel a gentle stretch in the front of your knee or thigh. 3. Hold this position for __________ seconds. 4. Slowly slide your left / right heel back to the starting position. Repeat __________ times. Complete this exercise __________ times a day. Exercise C: Quadriceps, Prone  1. Lie on your abdomen on a firm surface, such as a bed or padded floor. 2. Bend your left / right knee and hold your ankle. If you cannot reach your ankle or pant leg, loop a belt around your foot and grab the belt  instead. 3. Gently pull your heel toward your buttocks. Your knee should not slide out to the side. You should feel a stretch in the front of your thigh and knee. 4. Hold this position for __________ seconds. Repeat __________ times. Complete this stretch __________ times a day. Exercise D: Hamstring, Supine 1. Lie on your back. 2. Loop a belt or towel over the ball of your left / right foot. The ball of your foot is on the walking surface, right under your toes. 3. Straighten your left / right knee and slowly pull on the belt to raise your leg until you feel a gentle stretch behind your knee. ? Do not let your left / right knee bend while you do this. ? Keep your other leg flat on the floor. 4. Hold this position for __________ seconds. Repeat __________ times. Complete this stretch __________ times a day. STRENGTHENING EXERCISES These exercises build strength and endurance in your knee. Endurance is the ability to use your muscles for a long time, even after they get tired. Exercise E: Quadriceps, Isometric  1. Lie on your back with your left / right leg extended and your other knee bent. Put a rolled towel or small pillow under your knee if told by your health care provider. 2. Slowly tense the muscles in the front of your left / right thigh. You should see your kneecap slide up toward your hip or see increased dimpling just above the knee. This   motion will push the back of the knee toward the floor. 3. For __________ seconds, keep the muscle as tight as you can without increasing your pain. 4. Relax the muscles slowly and completely. Repeat __________ times. Complete this exercise __________ times a day. Exercise F: Straight Leg Raises - Quadriceps 1. Lie on your back with your left / right leg extended and your other knee bent. 2. Tense the muscles in the front of your left / right thigh. You should see your kneecap slide up or see increased dimpling just above the knee. Your thigh may  even shake a bit. 3. Keep these muscles tight as you raise your leg 4-6 inches (10-15 cm) off the floor. Do not let your knee bend. 4. Hold this position for __________ seconds. 5. Keep these muscles tense as you lower your leg. 6. Relax your muscles slowly and completely after each repetition. Repeat __________ times. Complete this exercise __________ times a day. Exercise G: Hamstring, Isometric 1. Lie on your back on a firm surface. 2. Bend your left / right knee approximately __________ degrees. 3. Dig your left / right heel into the surface as if you are trying to pull it toward your buttocks. Tighten the muscles in the back of your thighs to dig as hard as you can without increasing any pain. 4. Hold this position for __________ seconds. 5. Release the tension gradually and allow your muscles to relax completely for __________ seconds after each repetition. Repeat __________ times. Complete this exercise __________ times a day. Exercise H: Hamstring Curls  If told by your health care provider, do this exercise while wearing ankle weights. Begin with __________ weights. Then increase the weight by 1 lb (0.5 kg) increments. Do not wear ankle weights that are more than __________. 1. Lie on your abdomen with your legs straight. 2. Bend your left / right knee as far as you can without feeling pain. Keep your hips flat against the floor. 3. Hold this position for __________ seconds. 4. Slowly lower your leg to the starting position.  Repeat __________ times. Complete this exercise __________ times a day. Exercise I: Squats (Quadriceps) 1. Stand in front of a table, with your feet and knees pointing straight ahead. You may rest your hands on the table for balance but not for support. 2. Slowly bend your knees and lower your hips like you are going to sit in a chair. ? Keep your weight over your heels, not over your toes. ? Keep your lower legs upright so they are parallel with the table  legs. ? Do not let your hips go lower than your knees. ? Do not bend lower than told by your health care provider. ? If your knee pain increases, do not bend as low. 3. Hold the squat position for __________ seconds. 4. Slowly push with your legs to return to standing. Do not use your hands to pull yourself to standing. Repeat __________ times. Complete this exercise __________ times a day. Exercise J: Wall Slides (Quadriceps)  1. Lean your back against a smooth wall or door while you walk your feet out 18-24 inches (46-61 cm) from it. 2. Place your feet hip-width apart. 3. Slowly slide down the wall or door until your knees bend __________ degrees. Keep your knees over your heels, not over your toes. Keep your knees in line with your hips. 4. Hold for __________ seconds. Repeat __________ times. Complete this exercise __________ times a day. Exercise K: Straight Leg Raises -   Hip Abductors 1. Lie on your side with your left / right leg in the top position. Lie so your head, shoulder, knee, and hip line up. You may bend your bottom knee to help you keep your balance. 2. Roll your hips slightly forward so your hips are stacked directly over each other and your left / right knee is facing forward. 3. Leading with your heel, lift your top leg 4-6 inches (10-15 cm). You should feel the muscles in your outer hip lifting. ? Do not let your foot drift forward. ? Do not let your knee roll toward the ceiling. 4. Hold this position for __________ seconds. 5. Slowly return your leg to the starting position. 6. Let your muscles relax completely after each repetition. Repeat __________ times. Complete this exercise __________ times a day. Exercise L: Straight Leg Raises - Hip Extensors 1. Lie on your abdomen on a firm surface. You can put a pillow under your hips if that is more comfortable. 2. Tense the muscles in your buttocks and lift your left / right leg about 4-6 inches (10-15 cm). Keep your knee  straight as you lift your leg. 3. Hold this position for __________ seconds. 4. Slowly lower your leg to the starting position. 5. Let your leg relax completely after each repetition. Repeat __________ times. Complete this exercise __________ times a day. This information is not intended to replace advice given to you by your health care provider. Make sure you discuss any questions you have with your health care provider. Document Released: 03/01/2005 Document Revised: 01/10/2016 Document Reviewed: 02/21/2015 [employer] Interactive Patient Education  2018 [employer] Trochanteric Bursitis Rehab Ask your health care provider which exercises are safe for you. Do exercises exactly as told by your health care provider and adjust them as directed. It is normal to feel mild stretching, pulling, tightness, or discomfort as you do these exercises, but you should stop right away if you feel sudden pain or your pain gets worse.Do not begin these exercises until told by your health care provider. Stretching exercises These exercises warm up your muscles and joints and improve the movement and flexibility of your hip. These exercises also help to relieve pain and stiffness. Exercise A: Iliotibial band stretch  1. Lie on your side with your left / right leg in the top position. 2. Bend your left / right knee and grab your ankle. 3. Slowly bring your knee back so your thigh is behind your body. 4. Slowly lower your knee toward the floor until you feel a gentle stretch on the outside of your left / right thigh. If you do not feel a stretch and your knee will not fall farther, place the heel of your other foot on top of your outer knee and pull your thigh down farther. 5. Hold this position for __________ seconds. 6. Slowly return to the starting position. Repeat __________ times. Complete this exercise __________ times a day. Strengthening exercises These exercises build strength and endurance in your hip  and pelvis. Endurance is the ability to use your muscles for a long time, even after they get tired. Exercise B: Bridge ( hip extensors) 1. Lie on your back on a firm surface with your knees bent and your feet flat on the floor. 2. Tighten your buttocks muscles and lift your buttocks off the floor until your trunk is level with your thighs. You should feel the muscles working in your buttocks and the back of your thighs. If this exercise is  too easy, try doing it with your arms crossed over your chest. 3. Hold this position for __________ seconds. 4. Slowly return to the starting position. 5. Let your muscles relax completely between repetitions. Repeat __________ times. Complete this exercise __________ times a day. Exercise C: Squats ( knee extensors and  quadriceps) 1. Stand in front of a table, with your feet and knees pointing straight ahead. You may rest your hands on the table for balance but not for support. 2. Slowly bend your knees and lower your hips like you are going to sit in a chair. ? Keep your weight over your heels, not over your toes. ? Keep your lower legs upright so they are parallel with the table legs. ? Do not let your hips go lower than your knees. ? Do not bend lower than told by your health care provider. ? If your hip pain increases, do not bend as low. 3. Hold this position for __________ seconds. 4. Slowly push with your legs to return to standing. Do not use your hands to pull yourself to standing. Repeat __________ times. Complete this exercise __________ times a day. Exercise D: Hip hike 1. Stand sideways on a bottom step. Stand on your left / right leg with your other foot unsupported next to the step. You can hold onto the railing or wall if needed for balance. 2. Keeping your knees straight and your torso square, lift your left / right hip up toward the ceiling. 3. Hold this position for __________ seconds. 4. Slowly let your left / right hip lower toward  the floor, past the starting position. Your foot should get closer to the floor. Do not lean or bend your knees. Repeat __________ times. Complete this exercise __________ times a day. Exercise E: Single leg stand 1. Stand near a counter or door frame that you can hold onto for balance as needed. It is helpful to stand in front of a mirror for this exercise so you can watch your hip. 2. Squeeze your left / right buttock muscles then lift up your other foot. Do not let your left / right hip push out to the side. 3. Hold this position for __________ seconds. Repeat __________ times. Complete this exercise __________ times a day. This information is not intended to replace advice given to you by your health care provider. Make sure you discuss any questions you have with your health care provider. Document Released: 05/25/2004 Document Revised: 12/23/2015 Document Reviewed: 04/02/2015 [employer] Interactive Patient Education  2018 [employer]  

## 2017-08-07 ENCOUNTER — Telehealth (INDEPENDENT_AMBULATORY_CARE_PROVIDER_SITE_OTHER): Payer: Self-pay | Admitting: *Deleted

## 2017-08-07 NOTE — Telephone Encounter (Signed)
Pt is scheduled for MRI on Fri April 12 at 5pm at [facility], pt is to arrive 4:30pm. Left message on pt vm to return call for appt information

## 2017-08-10 ENCOUNTER — Ambulatory Visit (HOSPITAL_COMMUNITY)
Admission: RE | Admit: 2017-08-10 | Discharge: 2017-08-10 | Disposition: A | Discharge status: Home / Self Care | Payer: Medicare Other | Source: Ambulatory Visit | Attending: Physician Assistant | Admitting: Physician Assistant

## 2017-08-10 DIAGNOSIS — M1712 Unilateral primary osteoarthritis, left knee: Secondary | ICD-10-CM | POA: Diagnosis not present

## 2017-08-10 DIAGNOSIS — S83222A Peripheral tear of medial meniscus, current injury, left knee, initial encounter: Secondary | ICD-10-CM | POA: Insufficient documentation

## 2017-08-10 DIAGNOSIS — X58XXXA Exposure to other specified factors, initial encounter: Secondary | ICD-10-CM | POA: Insufficient documentation

## 2017-08-10 DIAGNOSIS — M25562 Pain in left knee: Secondary | ICD-10-CM | POA: Insufficient documentation

## 2017-08-10 NOTE — Telephone Encounter (Signed)
Pt aware of appt.

## 2017-08-13 ENCOUNTER — Ambulatory Visit: Payer: Self-pay | Admitting: Rheumatology

## 2017-08-14 ENCOUNTER — Telehealth: Payer: Self-pay | Admitting: Physician Assistant

## 2017-08-14 NOTE — Telephone Encounter (Signed)
I called the patient to discuss her MRI results of her left knee.  All questions were addressed.  We discussed treatment options.  She would like to move forward with a left knee cortisone injection.  She was scheduled for an injection on 08/16/2017 at 3 PM.

## 2017-08-15 ENCOUNTER — Other Ambulatory Visit: Payer: Self-pay | Admitting: *Deleted

## 2017-08-15 NOTE — Progress Notes (Signed)
Infusion orders are current for patient CBC CMP Tylenol Benadryl appointments are up to date and follow up appointment  is scheduled TB gold not due yet.  

## 2017-08-16 ENCOUNTER — Encounter: Payer: Self-pay | Admitting: Physician Assistant

## 2017-08-16 ENCOUNTER — Ambulatory Visit (INDEPENDENT_AMBULATORY_CARE_PROVIDER_SITE_OTHER): Payer: Medicare Other | Admitting: Physician Assistant

## 2017-08-16 VITALS — BP 128/79 | HR 64 | Resp 16 | Ht 64.0 in | Wt 185.0 lb

## 2017-08-16 DIAGNOSIS — M25562 Pain in left knee: Secondary | ICD-10-CM | POA: Diagnosis not present

## 2017-08-16 DIAGNOSIS — M1712 Unilateral primary osteoarthritis, left knee: Secondary | ICD-10-CM | POA: Diagnosis not present

## 2017-08-16 DIAGNOSIS — G8929 Other chronic pain: Secondary | ICD-10-CM | POA: Diagnosis not present

## 2017-08-16 MED ORDER — LIDOCAINE HCL 1 % IJ SOLN
1.5000 mL | INTRAMUSCULAR | Status: AC | PRN
  Administered 2017-08-16: 1.5 mL

## 2017-08-16 MED ORDER — TRIAMCINOLONE ACETONIDE 40 MG/ML IJ SUSP
40.0000 mg | INTRAMUSCULAR | Status: AC | PRN
  Administered 2017-08-16: 40 mg via INTRA_ARTICULAR

## 2017-08-16 NOTE — Progress Notes (Signed)
   Procedure Note  Patient: Wendy Woods             Date of Birth: May 13, 1960           MRN: [id number]             Visit Date: 08/16/2017  Procedures: Visit Diagnoses: Chronic pain of left knee  Primary osteoarthritis of left knee  Large Joint Inj: L knee on 08/16/2017 3:01 PM Indications: pain Details: 27 G 1.5 in needle, medial approach  Arthrogram: No  Medications: 1.5 mL lidocaine 1 %; 40 mg triamcinolone acetonide 40 MG/ML Aspirate: 0 mL Outcome: tolerated well, no immediate complications Procedure, treatment alternatives, risks and benefits explained, specific risks discussed. Consent was given by the patient. Immediately prior to procedure a time out was called to verify the correct patient, procedure, equipment, support staff and site/side marked as required. Patient was prepped and draped in the usual sterile fashion.     Patient tolerated the procedure well.  Sherron Ales, PA-C

## 2017-08-20 ENCOUNTER — Ambulatory Visit (HOSPITAL_COMMUNITY)
Admission: RE | Admit: 2017-08-20 | Discharge: 2017-08-20 | Disposition: A | Discharge status: Home / Self Care | Payer: Medicare Other | Source: Ambulatory Visit | Attending: Rheumatology | Admitting: Rheumatology

## 2017-08-20 DIAGNOSIS — M0579 Rheumatoid arthritis with rheumatoid factor of multiple sites without organ or systems involvement: Secondary | ICD-10-CM | POA: Diagnosis present

## 2017-08-20 MED ORDER — ACETAMINOPHEN 325 MG PO TABS
ORAL_TABLET | ORAL | Status: AC
  Filled 2017-08-20: qty 2

## 2017-08-20 MED ORDER — DIPHENHYDRAMINE HCL 25 MG PO CAPS
ORAL_CAPSULE | ORAL | Status: AC
  Filled 2017-08-20: qty 1

## 2017-08-20 MED ORDER — SODIUM CHLORIDE 0.9 % IV SOLN
6.0000 mg/kg | INTRAVENOUS | Status: DC
  Administered 2017-08-20: 500 mg via INTRAVENOUS
  Filled 2017-08-20: qty 50

## 2017-08-20 MED ORDER — DIPHENHYDRAMINE HCL 25 MG PO CAPS
25.0000 mg | ORAL_CAPSULE | ORAL | Status: DC
  Administered 2017-08-20: 25 mg via ORAL

## 2017-08-20 MED ORDER — ACETAMINOPHEN 325 MG PO TABS
650.0000 mg | ORAL_TABLET | ORAL | Status: DC
  Administered 2017-08-20: 650 mg via ORAL

## 2017-10-01 ENCOUNTER — Ambulatory Visit (HOSPITAL_COMMUNITY)
Admission: RE | Admit: 2017-10-01 | Discharge: 2017-10-01 | Disposition: A | Discharge status: Home / Self Care | Payer: Medicare Other | Source: Ambulatory Visit | Attending: Rheumatology | Admitting: Rheumatology

## 2017-10-01 DIAGNOSIS — M0579 Rheumatoid arthritis with rheumatoid factor of multiple sites without organ or systems involvement: Secondary | ICD-10-CM | POA: Insufficient documentation

## 2017-10-01 LAB — CBC
HCT: 41.1 % (ref 36.0–46.0)
Hemoglobin: 13.4 g/dL (ref 12.0–15.0)
MCH: 29.3 pg (ref 26.0–34.0)
MCHC: 32.6 g/dL (ref 30.0–36.0)
MCV: 89.9 fL (ref 78.0–100.0)
PLATELETS: 393 10*3/uL (ref 150–400)
RBC: 4.57 MIL/uL (ref 3.87–5.11)
RDW: 13.3 % (ref 11.5–15.5)
WBC: 5.9 10*3/uL (ref 4.0–10.5)

## 2017-10-01 LAB — COMPREHENSIVE METABOLIC PANEL
ALBUMIN: 3.4 g/dL — AB (ref 3.5–5.0)
ALT: 20 U/L (ref 14–54)
AST: 24 U/L (ref 15–41)
Alkaline Phosphatase: 70 U/L (ref 38–126)
Anion gap: 10 (ref 5–15)
BUN: 12 mg/dL (ref 6–20)
CHLORIDE: 98 mmol/L — AB (ref 101–111)
CO2: 30 mmol/L (ref 22–32)
CREATININE: 1.05 mg/dL — AB (ref 0.44–1.00)
Calcium: 9.8 mg/dL (ref 8.9–10.3)
GFR calc Af Amer: >60 mL/min (ref >60)
GFR, EST NON AFRICAN AMERICAN: 58 mL/min — AB (ref >60)
GLUCOSE: 118 mg/dL — AB (ref 65–99)
POTASSIUM: 3.6 mmol/L (ref 3.5–5.1)
SODIUM: 138 mmol/L (ref 135–145)
Total Bilirubin: 0.5 mg/dL (ref 0.3–1.2)
Total Protein: 7.9 g/dL (ref 6.5–8.1)

## 2017-10-01 MED ORDER — DIPHENHYDRAMINE HCL 25 MG PO CAPS
ORAL_CAPSULE | ORAL | Status: AC
  Filled 2017-10-01: qty 1

## 2017-10-01 MED ORDER — ACETAMINOPHEN 325 MG PO TABS
650.0000 mg | ORAL_TABLET | ORAL | Status: AC
  Administered 2017-10-01: 650 mg via ORAL

## 2017-10-01 MED ORDER — ACETAMINOPHEN 325 MG PO TABS
ORAL_TABLET | ORAL | Status: AC
  Filled 2017-10-01: qty 2

## 2017-10-01 MED ORDER — SODIUM CHLORIDE 0.9 % IV SOLN
6.0000 mg/kg | INTRAVENOUS | Status: DC
  Administered 2017-10-01: 500 mg via INTRAVENOUS
  Filled 2017-10-01: qty 50

## 2017-10-01 MED ORDER — DIPHENHYDRAMINE HCL 25 MG PO CAPS
25.0000 mg | ORAL_CAPSULE | ORAL | Status: AC
  Administered 2017-10-01: 25 mg via ORAL

## 2017-10-01 NOTE — Progress Notes (Signed)
Creatinine is mildly elevated.  Most likely due to diuretic use.  Please forward labs to her PCP.

## 2017-10-03 ENCOUNTER — Telehealth: Payer: Self-pay | Admitting: Rheumatology

## 2017-10-03 NOTE — Telephone Encounter (Signed)
Patient left a voicemail requesting the results from her labwork on 10/01/17.

## 2017-10-04 NOTE — Telephone Encounter (Signed)
Patient advised of lab results and copy sent to PCP.  

## 2017-11-06 ENCOUNTER — Encounter: Payer: Self-pay | Admitting: Neurology

## 2017-11-06 ENCOUNTER — Ambulatory Visit (INDEPENDENT_AMBULATORY_CARE_PROVIDER_SITE_OTHER): Payer: Medicare Other | Admitting: Neurology

## 2017-11-06 VITALS — BP 140/80 | HR 64 | Ht 64.0 in | Wt 176.0 lb

## 2017-11-06 DIAGNOSIS — R351 Nocturia: Secondary | ICD-10-CM | POA: Diagnosis not present

## 2017-11-06 DIAGNOSIS — R0683 Snoring: Secondary | ICD-10-CM

## 2017-11-06 DIAGNOSIS — E669 Obesity, unspecified: Secondary | ICD-10-CM

## 2017-11-06 DIAGNOSIS — R519 Headache, unspecified: Secondary | ICD-10-CM

## 2017-11-06 DIAGNOSIS — G4719 Other hypersomnia: Secondary | ICD-10-CM

## 2017-11-06 DIAGNOSIS — R51 Headache: Secondary | ICD-10-CM | POA: Diagnosis not present

## 2017-11-06 NOTE — Progress Notes (Signed)
Subjective:    Patient ID: Wendy Woods is a 57 y.o. female.  HPI     Star Age, MD, PhD [facility] [address], [address] [place], [place] [address]  Dear Dr. Virgina Jock,   I saw your patient, Wendy Woods, upon your kind request, in the neurologic clinic today for initial consultation of her sleep disorder, in particular, concern for underlying obstructive sleep apnea. The patient is unaccompanied today. As you know, Wendy Woods is a 57 year old right-handed woman with an underlying medical history of vitamin D deficiency, type 2 diabetes, hypertension, hyperlipidemia, rheumatoid arthritis, allergic rhinitis, and obesity, who reports snoring and excessive daytime somnolence. I reviewed your office note from 09/12/2017, which you kindly included. She has had complaints of shortness of breath. Her Epworth sleepiness score is 14 out of 24, fatigue score is 40 out of 63. She is divorced, she lives with her mother, she has no children. She is a nonsmoker and does not typically utilize any alcohol, drinks coffee occasionally, no day-to-day caffeine. Her mother moved in with her, she is 80 years old. Patient reports that she tends to listen out of her mom at night and is a light sleeper, restless as well. She has occasional or frequent morning headaches, up to 3-4 times per week, describes this as a dull achy headache. She has nocturia about once or twice per average night, she is not aware of any family history of OSA and denies restless leg symptoms or leg twitching at night. She goes to bed around 10 and rise time is around 7. She works part-time from home, in Therapist, art.  Her Past Medical History Is Significant For: Past Medical History:  Diagnosis Date  . DDD (degenerative disc disease), cervical 03/22/2016  . DDD (degenerative disc disease), lumbar 03/22/2016  . GERD (gastroesophageal reflux disease)   . Hyperlipidemia   . Hypertension   . Rheumatoid  arthritis ([place])   . Rheumatoid arthritis(714.0)   . 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 Type II diabetes mellitus ([place])     Her Past Surgical History Is Significant For: Past Surgical History:  Procedure Laterality Date  . APPENDECTOMY  1968  . CATARACT EXTRACTION W/ INTRAOCULAR LENS  IMPLANT, BILATERAL Bilateral 11/2013-12/2013  . KNEE ARTHROPLASTY    . KNEE ARTHROSCOPY Left 1980's-1990's X 3  . MYOMECTOMY  1990's    Her Family History Is Significant For: Family History  Problem Relation Age of Onset  . Colon polyps Father   . Prostate cancer Father   . Breast cancer Cousin 26  . Colon cancer Neg Hx     Her Social History Is Significant For: Social History   Socioeconomic History  . Marital status: Married    Spouse name: Not on file  . Number of children: Not on file  . Years of education: Not on file  . Highest education level: Not on file  Occupational History  . Not on file  Social Needs  . Financial resource strain: Not on file  . Food insecurity:    Worry: Not on file    Inability: Not on file  . Transportation needs:    Medical: Not on file    Non-medical: Not on file  Tobacco Use  . Smoking status: Never Smoker  . Smokeless tobacco: Never Used  Substance and Sexual Activity  . Alcohol use: No    Alcohol/week: 0.0 oz  . Drug use: No  . Sexual activity: Not Currently  Lifestyle  . Physical activity:  Days per week: Not on file    Minutes per session: Not on file  . Stress: Not on file  Relationships  . Social connections:    Talks on phone: Not on file    Gets together: Not on file    Attends religious service: Not on file    Active member of club or organization: Not on file    Attends meetings of clubs or organizations: Not on file    Relationship status: Not on file  Other Topics Concern  . Not on file  Social History Narrative  . Not on file    Her Allergies Are:  Allergies  Allergen Reactions  . Latex Anaphylaxis, Hives, Itching, Swelling,  Rash and Other (See Comments)    Causes bad reactions.    . Lisinopril Shortness Of Breath and Swelling    Face and tongue swelling, difficulty breathing, had to be admitted to the hospital  . Penicillins Anaphylaxis, Hives, Itching, Nausea And Vomiting, Rash and Other (See Comments)    Loss of consciousness also.   :   Her Current Medications Are:  Outpatient Encounter Medications as of 11/06/2017  Medication Sig  . acetaminophen (TYLENOL) 500 MG tablet Take 1,000 mg by mouth every 6 (six) hours as needed for pain.  Marland Kitchen amLODipine (NORVASC) 5 MG tablet Take 1 tablet (5 mg total) by mouth daily.  Marland Kitchen atorvastatin (LIPITOR) 40 MG tablet Take 40 mg by mouth daily.  Marland Kitchen azithromycin (ZITHROMAX) 250 MG tablet Start with 2 tablets today, then 1 daily thereafter.  . Canagliflozin-metFORMIN HCl (INVOKAMET PO) Take 300 mg by mouth daily.  . cetirizine (ZYRTEC ALLERGY) 10 MG tablet Take 1 tablet (10 mg total) by mouth daily.  . cholecalciferol (VITAMIN D) 1000 units tablet Take 400 Units by mouth daily.  . clindamycin-benzoyl peroxide (BENZACLIN) gel as needed.   . cyclobenzaprine (FLEXERIL) 5 MG tablet Take 1 tablet (5 mg total) at bedtime as needed by mouth for muscle spasms.  Marland Kitchen EPINEPHrine (EPIPEN 2-PAK) 0.3 mg/0.3 mL IJ SOAJ injection Inject 0.3 mLs (0.3 mg total) into the muscle once.  . ergocalciferol (VITAMIN D2) 50000 units capsule Take 50,000 Units by mouth once a week.  . famotidine (PEPCID) 20 MG tablet Take 1 tablet (20 mg total) by mouth 2 (two) times daily.  . folic acid (FOLVITE) 1 MG tablet Take 1 mg by mouth daily.  Marland Kitchen gabapentin (NEURONTIN) 300 MG capsule Take 300 mg by mouth 4 (four) times daily.  . hydrochlorothiazide (HYDRODIURIL) 12.5 MG tablet Take 12.5 mg by mouth daily.  Marland Kitchen inFLIXimab (REMICADE) 100 MG injection Inject into the vein.  . INVOKANA 100 MG TABS tablet Take 100 mg by mouth daily.   . metFORMIN (GLUCOPHAGE) 500 MG tablet Take 1 tablet (500 mg total) by mouth 2 (two) times  daily with a meal.  . methotrexate 50 MG/2ML injection Inject 0.3 mLs (7.5 mg total) into the skin once a week.  . Methotrexate, PF, 25 MG/0.4ML SOAJ Inject into the skin.  . potassium chloride (K-DUR) 10 MEQ tablet Take 10 mEq by mouth daily.  . pseudoephedrine (SUDAFED) 60 MG tablet Take 1 tablet (60 mg total) by mouth every 8 (eight) hours as needed for congestion.  Marland Kitchen tretinoin (RETIN-A) 0.025 % cream as needed.   . Tuberculin-Allergy Syringes 27G X 1/2" 1 ML KIT Inject 1 Syringe into the skin once a week.   No facility-administered encounter medications on file as of 11/06/2017.   :  Review of Systems:  Out  of a complete 14 point review of systems, all are reviewed and negative with the exception of these symptoms as listed below:  Review of Systems  Neurological:       Pt presents today to discuss her sleep. Pt has never had a sleep study but does endorse snoring. Pt is also complaining of a hard time with memory recall.  Epworth Sleepiness Scale 0= would never doze 1= slight chance of dozing 2= moderate chance of dozing 3= high chance of dozing  Sitting and reading: 2 Watching TV: 2 Sitting inactive in a public place (ex. Theater or meeting): 1 As a passenger in a car for an hour without a break: 2 Lying down to rest in the afternoon: 2 Sitting and talking to someone: 2 Sitting quietly after lunch (no alcohol): 2 In a car, while stopped in traffic: 1 Total: 14     Objective:  Neurological Exam  Physical Exam Physical Examination:   Vitals:   11/06/17 1601  BP: 140/80  Pulse: 64   General Examination: The patient is a very pleasant 58 y.o. female in no acute distress. She appears well-developed and well-nourished and well groomed.   HEENT: Normocephalic, atraumatic, pupils are equal, round and reactive to light and accommodation. Corrective eyeglasses in place, cataract repairs. Extraocular tracking is good without limitation to gaze excursion or nystagmus noted.  Normal smooth pursuit is noted. Hearing is grossly intact. Face is symmetric with normal facial animation and normal facial sensation. Speech is clear with no dysarthria noted. There is no hypophonia. There is no lip, neck/head, jaw or voice tremor. Neck is supple with full range of passive and active motion. There are no carotid bruits on auscultation. Oropharynx exam reveals: mild mouth dryness, adequate dental hygiene with dentures on top, mild airway crowding secondary to smaller airway entry, tonsils are the smaller side around 1+, uvula also on the smaller side. Mallampati is class II. Tongue protrudes centrally and palate elevates symmetrically. She has a minimal overbite, slightly skewed bite.   Chest: Clear to auscultation without wheezing, rhonchi or crackles noted.  Heart: S1+S2+0, regular and normal without murmurs, rubs or gallops noted.   Abdomen: Soft, non-tender and non-distended with normal bowel sounds appreciated on auscultation.  Extremities: There is no pitting edema in the distal lower extremities bilaterally. Pedal pulses are intact.  Skin: Warm and dry without trophic changes noted.  Musculoskeletal: exam reveals no obvious joint deformities, tenderness or joint swelling or erythema, scars from L knee arthroscopic surgeries.   Neurologically:  Mental status: The patient is awake, alert and oriented in all 4 spheres. Her immediate and remote memory, attention, language skills and fund of knowledge are appropriate. There is no evidence of aphasia, agnosia, apraxia or anomia. Speech is clear with normal prosody and enunciation. Thought process is linear. Mood is normal and affect is normal.  Cranial nerves II - XII are as described above under HEENT exam. In addition: shoulder shrug is normal with equal shoulder height noted. Motor exam: Normal bulk, strength and tone is noted. There is no drift, tremor or rebound. Romberg is negative. Reflexes are 2+ throughout. Fine motor  skills and coordination: intact with normal finger taps, normal hand movements, normal rapid alternating patting, normal foot taps and normal foot agility.  Cerebellar testing: No dysmetria or intention tremor on finger to nose testing. Heel to shin is unremarkable bilaterally. There is no truncal or gait ataxia.  Sensory exam: intact to light touch in the upper and lower extremities.  Gait, station and balance: She stands easily. No veering to one side is noted. No leaning to one side is noted. Posture is age-appropriate and stance is narrow based. Gait shows normal stride length and normal pace. No problems turning are noted. Tandem walk is unremarkable.   Assessment and Plan:  In summary, Tony Friscia is a very pleasant 57 y.o.-year old female with an underlying medical history of vitamin D deficiency, type 2 diabetes, hypertension, hyperlipidemia, rheumatoid arthritis, allergic rhinitis, and obesity, whose history and physical exam are concerning for obstructive sleep apnea (OSA). I had a long chat with the patient about my findings and the diagnosis of OSA, its prognosis and treatment options. We talked about medical treatments, surgical interventions and non-pharmacological approaches. I explained in particular the risks and ramifications of untreated moderate to severe OSA, especially with respect to developing cardiovascular disease down the Road, including congestive heart failure, difficult to treat hypertension, cardiac arrhythmias, or stroke. Even type 2 diabetes has, in part, been linked to untreated OSA. Symptoms of untreated OSA include daytime sleepiness, memory problems, mood irritability and mood disorder such as depression and anxiety, lack of energy, as well as recurrent headaches, especially morning headaches. We talked about trying to maintain a healthy lifestyle in general, as well as the importance of weight control. I encouraged the patient to eat healthy, exercise daily and keep well  hydrated, to keep a scheduled bedtime and wake time routine, to not skip any meals and eat healthy snacks in between meals. I advised the patient not to drive when feeling sleepy. I recommended the following at this time: sleep study with potential positive airway pressure titration. (We will score hypopneas at 4%).   I explained the sleep test procedure to the patient and also outlined possible surgical and non-surgical treatment options of OSA, including the use of a custom-made dental device (which would require a referral to a specialist dentist or oral surgeon), upper airway surgical options, such as pillar implants, radiofrequency surgery, tongue base surgery, and UPPP (which would involve a referral to an ENT surgeon). Rarely, jaw surgery such as mandibular advancement may be considered.  I also explained the CPAP treatment option to the patient, who indicated that she would be willing to try CPAP if the need arises. I explained the importance of being compliant with PAP treatment, not only for insurance purposes but primarily to improve Her symptoms, and for the patient's long term health benefit, including to reduce Her cardiovascular risks. I answered all her questions today and the patient was in agreement. I plan to see her back after the sleep study is completed and encouraged her to call with any interim questions, concerns, problems or updates.   Thank you very much for allowing me to participate in the care of this nice patient. If I can be of any further assistance to you please do not hesitate to call me at [phone].  Sincerely,   Star Age, MD, PhD

## 2017-11-06 NOTE — Patient Instructions (Signed)

## 2017-11-09 ENCOUNTER — Other Ambulatory Visit: Payer: Self-pay | Admitting: *Deleted

## 2017-11-09 DIAGNOSIS — M0579 Rheumatoid arthritis with rheumatoid factor of multiple sites without organ or systems involvement: Secondary | ICD-10-CM

## 2017-11-12 ENCOUNTER — Ambulatory Visit (HOSPITAL_COMMUNITY)
Admission: RE | Admit: 2017-11-12 | Discharge: 2017-11-12 | Disposition: A | Discharge status: Home / Self Care | Payer: Medicare Other | Source: Ambulatory Visit | Attending: Rheumatology | Admitting: Rheumatology

## 2017-11-12 DIAGNOSIS — M0579 Rheumatoid arthritis with rheumatoid factor of multiple sites without organ or systems involvement: Secondary | ICD-10-CM

## 2017-11-12 LAB — COMPREHENSIVE METABOLIC PANEL
ALBUMIN: 3.4 g/dL — AB (ref 3.5–5.0)
ALK PHOS: 58 U/L (ref 38–126)
ALT: 22 U/L (ref 0–44)
ANION GAP: 11 (ref 5–15)
AST: 28 U/L (ref 15–41)
BILIRUBIN TOTAL: 0.7 mg/dL (ref 0.3–1.2)
BUN: 14 mg/dL (ref 6–20)
CO2: 30 mmol/L (ref 22–32)
CREATININE: 1.15 mg/dL — AB (ref 0.44–1.00)
Calcium: 9.4 mg/dL (ref 8.9–10.3)
Chloride: 97 mmol/L — ABNORMAL LOW (ref 98–111)
GFR calc Af Amer: >60 mL/min (ref >60)
GFR calc non Af Amer: 52 mL/min — ABNORMAL LOW (ref >60)
GLUCOSE: 177 mg/dL — AB (ref 70–99)
Potassium: 3 mmol/L — ABNORMAL LOW (ref 3.5–5.1)
Sodium: 138 mmol/L (ref 135–145)
TOTAL PROTEIN: 7.4 g/dL (ref 6.5–8.1)

## 2017-11-12 LAB — CBC
HEMATOCRIT: 38.8 % (ref 36.0–46.0)
HEMOGLOBIN: 12.4 g/dL (ref 12.0–15.0)
MCH: 29 pg (ref 26.0–34.0)
MCHC: 32 g/dL (ref 30.0–36.0)
MCV: 90.9 fL (ref 78.0–100.0)
Platelets: 361 10*3/uL (ref 150–400)
RBC: 4.27 MIL/uL (ref 3.87–5.11)
RDW: 13.6 % (ref 11.5–15.5)
WBC: 5.9 10*3/uL (ref 4.0–10.5)

## 2017-11-12 MED ORDER — SODIUM CHLORIDE 0.9 % IV SOLN
6.0000 mg/kg | INTRAVENOUS | Status: DC
  Administered 2017-11-12: 500 mg via INTRAVENOUS
  Filled 2017-11-12: qty 50

## 2017-11-12 MED ORDER — ACETAMINOPHEN 325 MG PO TABS
ORAL_TABLET | ORAL | Status: AC
  Filled 2017-11-12: qty 2

## 2017-11-12 MED ORDER — DIPHENHYDRAMINE HCL 25 MG PO CAPS
25.0000 mg | ORAL_CAPSULE | ORAL | Status: DC
  Administered 2017-11-12: 25 mg via ORAL

## 2017-11-12 MED ORDER — DIPHENHYDRAMINE HCL 25 MG PO CAPS
ORAL_CAPSULE | ORAL | Status: AC
  Filled 2017-11-12: qty 1

## 2017-11-12 MED ORDER — ACETAMINOPHEN 325 MG PO TABS
650.0000 mg | ORAL_TABLET | ORAL | Status: DC
  Administered 2017-11-12: 650 mg via ORAL

## 2017-11-13 NOTE — Progress Notes (Signed)
Potassium is low.  Please notify patient.  She should contact her PCP to get.  Please fax results to her PCP.  Creatinine is slightly elevated.  We will monitor it.

## 2017-11-15 ENCOUNTER — Ambulatory Visit (INDEPENDENT_AMBULATORY_CARE_PROVIDER_SITE_OTHER): Payer: Medicare Other | Admitting: Neurology

## 2017-11-15 DIAGNOSIS — R0683 Snoring: Secondary | ICD-10-CM

## 2017-11-15 DIAGNOSIS — R51 Headache: Secondary | ICD-10-CM

## 2017-11-15 DIAGNOSIS — E669 Obesity, unspecified: Secondary | ICD-10-CM

## 2017-11-15 DIAGNOSIS — G4733 Obstructive sleep apnea (adult) (pediatric): Secondary | ICD-10-CM | POA: Diagnosis not present

## 2017-11-15 DIAGNOSIS — R519 Headache, unspecified: Secondary | ICD-10-CM

## 2017-11-15 DIAGNOSIS — R351 Nocturia: Secondary | ICD-10-CM

## 2017-11-15 DIAGNOSIS — G4719 Other hypersomnia: Secondary | ICD-10-CM

## 2017-11-21 ENCOUNTER — Telehealth: Payer: Self-pay

## 2017-11-21 NOTE — Telephone Encounter (Signed)
-----   Message from Huston Foley, MD sent at 11/21/2017  7:58 AM EDT ----- Patient referred by Dr. Rosemary Holms, seen by me on 11/06/17, diagnostic PSG on 11/15/17.   Please call and notify the patient that the recent sleep study showed overall borderline obstructive sleep apnea, more pronounced in supine REM sleep with a total AHI of 5.1/hour, REM AHI of 16.6/hour, and O2 nadir of 84%. Treatment with PAP or autoPAP is not warranted, but can be considered if desired. Avoidance of supine sleep position along with weight loss will likely suffice as treatment. If she would like to try autoPAP we can see about setting her up with a machine at home through a DME company.  Otherwise, I would promote wt loss and sleeping on the sides, and of course to allow for 7-8 h of sleep. She can FU prn. Thanks,  Huston Foley, MD, PhD [facility])

## 2017-11-21 NOTE — Telephone Encounter (Signed)
I called pt and explained her sleep study results. Pt declined an auto pap. Pt will pursue weight loss and avoidance of supine sleep. Pt asked that a copy of her sleep study be sent to Dr. Rosemary Holms. Pt verbalized understanding of results. Pt had no questions at this time but was encouraged to call back if questions arise.

## 2017-11-21 NOTE — Progress Notes (Signed)
Patient referred by Dr. Rosemary Holms, seen by me on 11/06/17, diagnostic PSG on 11/15/17.   Please call and notify the patient that the recent sleep study showed overall borderline obstructive sleep apnea, more pronounced in supine REM sleep with a total AHI of 5.1/hour, REM AHI of 16.6/hour, and O2 nadir of 84%. Treatment with PAP or autoPAP is not warranted, but can be considered if desired. Avoidance of supine sleep position along with weight loss will likely suffice as treatment. If she would like to try autoPAP we can see about setting her up with a machine at home through a DME company.  Otherwise, I would promote wt loss and sleeping on the sides, and of course to allow for 7-8 h of sleep. She can FU prn. Thanks,  Huston Foley, MD, PhD [facility])

## 2017-11-21 NOTE — Procedures (Signed)
PATIENT'S NAME:  W[place] Woods, Wendy Woods DOB:      December 06, 1960      MR#:    [id number]     DATE OF RECORDING: 11/15/2017 REFERRING M.D.:  Truett Mainland, MD Study Performed:   Baseline Polysomnogram HISTORY: 57 year old woman with a history of vitamin D deficiency, type 2 diabetes, hypertension, hyperlipidemia, rheumatoid arthritis, allergic rhinitis, and obesity, who reports snoring and excessive daytime somnolence. The patient endorsed the Epworth Sleepiness Scale at 14/24 points. The patient's weight 176 pounds with a height of 64 (inches), resulting in a BMI of 30.1 kg/m2. The patient's neck circumference measured 15 inches.  CURRENT MEDICATIONS: Tylenol, Norvasc, Lipitor, Zithromax, Invokamet, Zyrtec, Vitamin D, Benzaclin, Flexeril, Epipen, Pepcid, Folvite, Neurontin, Hydrodiuril, Remicade, Invokana, Glucophage, Methotrexate, K-Dur, Sudafed, Retin-A, Tuberculin-Allergy Syringes.   PROCEDURE:  This is a multichannel digital polysomnogram utilizing the Somnostar 11.2 system.  Electrodes and sensors were applied and monitored per AASM Specifications.   EEG, EOG, Chin and Limb EMG, were sampled at 200 Hz.  ECG, Snore and Nasal Pressure, Thermal Airflow, Respiratory Effort, CPAP Flow and Pressure, Oximetry was sampled at 50 Hz. Digital video and audio were recorded.      BASELINE STUDY  Lights Out was at 22:11 and Lights On at 05:13.  Total recording time (TRT) was 422.5 minutes, with a total sleep time (TST) of  377 minutes.   The patient's sleep latency was 11.5 minutes.  REM latency was 116.5 minutes.  The sleep efficiency was 89.2 %.     SLEEP ARCHITECTURE: WASO (Wake after sleep onset) was 37 minutes with mild sleep fragmentation noted. There were 19 minutes in Stage N1, 242 minutes Stage N2, 58 minutes Stage N3 and 58 minutes in Stage REM.  The percentage of Stage N1 was 5.%, Stage N2 was 64.2%, which is mildly increased, Stage N3 was 15.4%, which is normal and Stage R (REM sleep) was 15.4%, which is  mildly reduced. The arousals were noted as: 37 were spontaneous, 8 were associated with PLMs, 4 were associated with respiratory events.  RESPIRATORY ANALYSIS:  There were a total of 32 respiratory events:  5 obstructive apneas, 3 central apneas and 0 mixed apneas with a total of 8 apneas and an apnea index (AI) of 1.3 /hour. There were 24 hypopneas with a hypopnea index of 3.8 /hour. The patient also had 0 respiratory event related arousals (RERAs).      The total APNEA/HYPOPNEA INDEX (AHI) was 5.1/hour and the total RESPIRATORY DISTURBANCE INDEX was 0. 5.1 /hour.  16 events occurred in REM sleep and 29 events in NREM. The REM AHI was 16.6 /hour, versus a non-REM AHI of 3.. The patient spent 254 minutes of total sleep time in the supine position and 123 minutes in non-supine. The supine AHI was 6.9 versus a non-supine AHI of 1.5.  OXYGEN SATURATION & C02:  The Wake baseline 02 saturation was 98%, with the lowest being 84%. Time spent below 89% saturation equaled 14 minutes (overestimated, due to loss of sensor for several minutes). PERIODIC LIMB MOVEMENTS: The patient had a total of 21 Periodic Limb Movements.  The Periodic Limb Movement (PLM) index was 3.3 and the PLM Arousal index was 1.3/hour.  Audio and video analysis did not show any abnormal or unusual movements, behaviors, phonations or vocalizations. The patient took on bathroom break. Mild intermittent snoring was noted. The EKG was in keeping with normal sinus rhythm (NSR).  Post-study, the patient indicated that sleep was worse than usual.   IMPRESSION:  1.  Obstructive Sleep Apnea (OSA), borderline  RECOMMENDATIONS:  1. This study demonstrates overall borderline obstructive sleep apnea, more pronounced in supine REM sleep with a total AHI of 5.1/hour, REM AHI of 16.6/hour, and O2 nadir of 84%. Treatment with PAP or autoPAP is not warranted, but can be considered if desired. Avoidance of supine sleep position along with weight loss will  likely suffice as treatment. Other OSA treatment options may include in the right clinical setting: upper airway or jaw surgery in selected patients or the use of an oral appliance in certain patients. ENT evaluation and/or consultation with a maxillofacial surgeon or dentist may be feasible in some instances.  2. The patient should be cautioned not to drive, work at heights, or operate dangerous or heavy equipment when tired or sleepy. Review and reiteration of good sleep hygiene measures should be pursued with any patient. 3. The patient and her referring provider will be notified of the test results.  I certify that I have reviewed the entire raw data recording prior to the issuance of this report in accordance with the Standards of Accreditation of the American Academy of Sleep Medicine (AASM)   Huston Foley, MD, PhD Diplomat, American Board of Neurology and Sleep Medicine (Neurology and Sleep Medicine)

## 2017-11-21 NOTE — Telephone Encounter (Signed)
I called pt to discuss her sleep study results. No answer, left a message asking her to call me back. 

## 2017-12-20 ENCOUNTER — Other Ambulatory Visit: Payer: Self-pay | Admitting: *Deleted

## 2017-12-20 NOTE — Progress Notes (Signed)
Infusion orders are current for patient CBC CMP Tylenol Benadryl appointments are up to date and follow up appointment  is scheduled TB gold is due and order placed.  

## 2017-12-24 ENCOUNTER — Ambulatory Visit (HOSPITAL_COMMUNITY)
Admission: RE | Admit: 2017-12-24 | Discharge: 2017-12-24 | Disposition: A | Discharge status: Home / Self Care | Payer: Medicare Other | Source: Ambulatory Visit | Attending: Rheumatology | Admitting: Rheumatology

## 2017-12-24 DIAGNOSIS — M0579 Rheumatoid arthritis with rheumatoid factor of multiple sites without organ or systems involvement: Secondary | ICD-10-CM | POA: Diagnosis present

## 2017-12-24 MED ORDER — ACETAMINOPHEN 325 MG PO TABS
ORAL_TABLET | ORAL | Status: AC
  Administered 2017-12-24: 650 mg via ORAL
  Filled 2017-12-24: qty 2

## 2017-12-24 MED ORDER — ACETAMINOPHEN 325 MG PO TABS
650.0000 mg | ORAL_TABLET | ORAL | Status: DC
  Administered 2017-12-24: 650 mg via ORAL

## 2017-12-24 MED ORDER — DIPHENHYDRAMINE HCL 25 MG PO CAPS
ORAL_CAPSULE | ORAL | Status: AC
  Administered 2017-12-24: 25 mg via ORAL
  Filled 2017-12-24: qty 1

## 2017-12-24 MED ORDER — DIPHENHYDRAMINE HCL 25 MG PO CAPS
25.0000 mg | ORAL_CAPSULE | ORAL | Status: DC
  Administered 2017-12-24: 25 mg via ORAL

## 2017-12-24 MED ORDER — SODIUM CHLORIDE 0.9 % IV SOLN
6.0000 mg/kg | INTRAVENOUS | Status: DC
  Administered 2017-12-24: 500 mg via INTRAVENOUS
  Filled 2017-12-24: qty 50

## 2017-12-24 NOTE — Progress Notes (Signed)
Office Visit Note  Patient: Wendy Woods             Date of Birth: 04/01/1961           MRN: [id number]             PCP: Fleet Contras, MD Referring: Fleet Contras, MD Visit Date: 01/07/2018 Occupation: @GUAROCC @  Subjective:  Left knee pain   History of Present Illness: Wendy Woods is a 57 y.o. female with history of seropositive rheumatoid arthritis, osteoarthritis, and DDD.  Patient is on Remicade 6 mg/kg, Otrexup 20 mg every week, and folic acid 2 mg daily.  She denies any recent rheumatoid arthritis flares.  She states that she still feels like her Remicade infusions are lasting every 6 weeks.  She states that she continues to have pain in both hands and feet but denies any joint swelling.  She says she is also having increased lower back pain but denies any radiation of pain or numbness.  She states that she continues to have left knee pain is been worse over the past 2 days.  She states the pain is a 10 out of 10.  She states that her knee pain is worse with weather changes.  She is Biofreeze and Voltaren gel on a regular basis.  She has left trochanteric bursitis that has been keeping her up at night.  She states that she occasionally has right knee pain as well.  She has a ganglion cyst on the dorsal aspect of the left wrist, which is worse after typing.   Activities of Daily Living: Patient reports morning stiffness for 5  minutes.   Patient Reports nocturnal pain.  Difficulty dressing/grooming: Denies Difficulty climbing stairs: Denies Difficulty getting out of chair: Denies Difficulty using hands for taps, buttons, cutlery, and/or writing: Reports  Review of Systems  Constitutional: Positive for fatigue.  HENT: Negative for mouth sores, mouth dryness and nose dryness.   Eyes: Negative for pain, visual disturbance and dryness.  Respiratory: Negative for cough, hemoptysis, shortness of breath and difficulty breathing.   Cardiovascular: Negative for chest pain,  palpitations, hypertension and swelling in legs/feet.  Gastrointestinal: Negative for blood in stool, constipation and diarrhea.  Endocrine: Negative for increased urination.  Genitourinary: Negative for painful urination.  Musculoskeletal: Positive for arthralgias, joint pain, myalgias, morning stiffness and myalgias. Negative for joint swelling, muscle weakness and muscle tenderness.  Skin: Negative for color change, pallor, rash, hair loss, nodules/bumps, skin tightness, ulcers and sensitivity to sunlight.  Allergic/Immunologic: Negative for susceptible to infections.  Neurological: Negative for dizziness, numbness, headaches and weakness.  Hematological: Negative for swollen glands.  Psychiatric/Behavioral: Positive for sleep disturbance. Negative for depressed mood. The patient is not nervous/anxious.     PMFS History:  Patient Active Problem List   Diagnosis Date Noted  . DDD (degenerative disc disease), cervical 03/22/2016  . DDD (degenerative disc disease), lumbar 03/22/2016  . 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 Angioedema 07/15/2014  . Hypokalemia 07/15/2014  . Diabetes mellitus, controlled (HCC) 07/03/2012  . HTN (hypertension) 07/03/2012  . Rheumatoid arthritis (HCC) 07/03/2012  . Current chronic use of systemic steroids 07/03/2012  . 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 DDD (degenerative disc disease), cervical 03/22/2016  . DDD (degenerative disc disease), lumbar 03/22/2016  . GERD (gastroesophageal reflux disease)   . Hyperlipidemia   . Hypertension   . Rheumatoid arthritis (HCC)   . Rheumatoid arthritis(714.0)   . 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  Type II diabetes mellitus (HCC)     Family History  Problem Relation Age of Onset  . Colon polyps Father   . Prostate cancer Father   . Breast cancer Cousin 26  . Colon cancer Neg Hx    Past Surgical History:  Procedure Laterality Date  . APPENDECTOMY  1968  . CATARACT EXTRACTION W/  INTRAOCULAR LENS  IMPLANT, BILATERAL Bilateral 11/2013-12/2013  . KNEE ARTHROPLASTY    . KNEE ARTHROSCOPY Left 1980's-1990's X 3  . MYOMECTOMY  1990's   Social History   Social History Narrative  . Not on file    Objective: Vital Signs: BP 132/80 (BP Location: Left Arm, Patient Position: Sitting, Cuff Size: Normal)   Pulse (!) 59   Resp 13   Ht 5\' 4"  (1.626 m)   Wt 178 lb 12.8 oz (81.1 kg)   LMP 07/09/2014   BMI 30.69 kg/m    Physical Exam  Constitutional: She is oriented to person, place, and time. She appears well-developed and well-nourished.  HENT:  Head: Normocephalic and atraumatic.  Eyes: Conjunctivae and EOM are normal.  Neck: Normal range of motion.  Cardiovascular: Normal rate, regular rhythm, normal heart sounds and intact distal pulses.  Pulmonary/Chest: Effort normal and breath sounds normal.  Abdominal: Soft. Bowel sounds are normal.  Lymphadenopathy:    She has no cervical adenopathy.  Neurological: She is alert and oriented to person, place, and time.  Skin: Skin is warm and dry. Capillary refill takes less than 2 seconds.  Psychiatric: She has a normal mood and affect. Her behavior is normal.  Nursing note and vitals reviewed.    Musculoskeletal Exam: C-spine limited range of motion with lateral rotation.  Thoracic and lumbar spine good range of motion.  No midline spinal tenderness.  No SI joint tenderness.  Right shoulder full range of motion.  Left shoulder limited internal rotation.  Elbow joints, wrist joints, MCPs, PIPs, DIPs good range of motion with no synovitis.  She has complete fist formation bilaterally.  Ganglion cyst present on the dorsal aspect of left wrist.  Hip joints good range of motion with no discomfort.  She has trochanteric bursitis bilaterally.  Tenderness of bilateral knee joints.  No warmth or effusion noted.  CDAI Exam: CDAI Score: 2.9  Patient Global Assessment: 6 (mm); Provider Global Assessment: 3 (mm) Swollen: 0 ; Tender: 2    Joint Exam      Right  Left  MCP 3   Tender   Tender   There is currently no information documented on the homunculus. Go to the Rheumatology activity and complete the homunculus joint exam.  Investigation: No additional findings.  Imaging: No results found.  Recent Labs: Lab Results  Component Value Date   WBC 5.9 11/12/2017   HGB 12.4 11/12/2017   PLT 361 11/12/2017   NA 138 11/12/2017   K 3.0 (L) 11/12/2017   CL 97 (L) 11/12/2017   CO2 30 11/12/2017   GLUCOSE 177 (H) 11/12/2017   BUN 14 11/12/2017   CREATININE 1.15 (H) 11/12/2017   BILITOT 0.7 11/12/2017   ALKPHOS 58 11/12/2017   AST 28 11/12/2017   ALT 22 11/12/2017   PROT 7.4 11/12/2017   ALBUMIN 3.4 (L) 11/12/2017   CALCIUM 9.4 11/12/2017   GFRAA >60 11/12/2017   QFTBGOLD Negative 09/18/2016   QFTBGOLDPLUS Negative 12/24/2017    Speciality Comments: Remicade 6mg / kg every 6 weeks TB gold negative 08/2016  Procedures:  No procedures performed Allergies: Latex; Lisinopril; and Penicillins  Assessment / Plan:     Visit Diagnoses: Rheumatoid arthritis involving multiple sites with positive rheumatoid factor (HCC) - + RF, Anti-CCP +, ANA -: She has no synovitis on exam.  She has not had any recent rheumatoid arthritis flares.  She is clinically doing well on Remicade 6 mg/kg infusions every 6 weeks, Otrexup 20 mg once weekly, and folic acid 2 mg daily.  She does not need any refills at this time.  She will follow-up in the office in 5 months.  She was advised to notify us if she develops increased joint pain or joint swelling.  High risk medication use - Remicade 6 mg/kg every 6 weeks, Otrexup 20 mg every week, folic acid 2 mg by mouth daily.  TB gold -12/24/2017.  CBC and CMP are drawn on 11/12/2017.  She has lab work performed with her infusions.  Primary osteoarthritis of left knee - MRI of the left knee was performed on 08/10/2017 that revealed a small peripheral horizontal tear of the medial meniscus  body/posterior horn junction.  She has moderate tricompartmental osteoarthritis as well.  She had a cortisone injection performed on 08/16/2017 which provided temporary relief.  She is having severe pain in her left knee at this time.  She ranks the pain a 10 out of 10.  She uses Voltaren gel as well as Biofreeze with temporary relief.  We discussed treatment options.  She was advised to schedule an appointment with Dr. Cleophas Dunker for further evaluation.  DDD (degenerative disc disease), cervical: She has limited range of motion with lateral rotation.  No symptoms of cervical radiculopathy at this time.  DDD (degenerative disc disease), lumbar: No midline spinal tenderness.  She has chronic lower back pain.  She uses a heating pad as well as Biofreeze as needed.  Primary osteoarthritis of both hands: She has tenderness of the left CMC joint on exam.  She has mild PIP and DIP synovial thickening consistent with osteoarthritis of bilateral hands.  She has complete fist formation bilaterally.  Joint protection and muscle strengthening were discussed.  Scleritis, unspecified laterality: She has no recent recurrences.  Other medical conditions are listed as follows:  Essential hypertension  History of diabetes mellitus, type II   Orders: No orders of the defined types were placed in this encounter.  No orders of the defined types were placed in this encounter.   Face-to-face time spent with patient was 30 minutes. Greater than 50% of time was spent in counseling and coordination of care.  Follow-Up Instructions: Return in about 5 months (around 06/09/2018) for Rheumatoid arthritis, Osteoarthritis, DDD.   Gearldine Bienenstock, PA-C  Note - This record has been created using Dragon software.  Chart creation errors have been sought, but may not always  have been located. Such creation errors do not reflect on  the standard of medical care.

## 2017-12-28 LAB — QUANTIFERON-TB GOLD PLUS (RQFGPL)
QUANTIFERON TB1 AG VALUE: 0.04 [IU]/mL
QuantiFERON Nil Value: 0.02 IU/mL
QuantiFERON TB2 Ag Value: 0.02 IU/mL

## 2017-12-28 LAB — QUANTIFERON-TB GOLD PLUS: QuantiFERON-TB Gold Plus: NEGATIVE

## 2018-01-07 ENCOUNTER — Ambulatory Visit (INDEPENDENT_AMBULATORY_CARE_PROVIDER_SITE_OTHER): Payer: Medicare Other | Admitting: Physician Assistant

## 2018-01-07 ENCOUNTER — Encounter: Payer: Self-pay | Admitting: Physician Assistant

## 2018-01-07 VITALS — BP 132/80 | HR 59 | Resp 13 | Ht 64.0 in | Wt 178.8 lb

## 2018-01-07 DIAGNOSIS — M19041 Primary osteoarthritis, right hand: Secondary | ICD-10-CM

## 2018-01-07 DIAGNOSIS — M5030 Other cervical disc degeneration, unspecified cervical region: Secondary | ICD-10-CM

## 2018-01-07 DIAGNOSIS — Z8639 Personal history of other endocrine, nutritional and metabolic disease: Secondary | ICD-10-CM

## 2018-01-07 DIAGNOSIS — H15009 Unspecified scleritis, unspecified eye: Secondary | ICD-10-CM

## 2018-01-07 DIAGNOSIS — M0579 Rheumatoid arthritis with rheumatoid factor of multiple sites without organ or systems involvement: Secondary | ICD-10-CM | POA: Diagnosis not present

## 2018-01-07 DIAGNOSIS — M19042 Primary osteoarthritis, left hand: Secondary | ICD-10-CM

## 2018-01-07 DIAGNOSIS — Z79899 Other long term (current) drug therapy: Secondary | ICD-10-CM

## 2018-01-07 DIAGNOSIS — M5136 Other intervertebral disc degeneration, lumbar region: Secondary | ICD-10-CM

## 2018-01-07 DIAGNOSIS — M1712 Unilateral primary osteoarthritis, left knee: Secondary | ICD-10-CM | POA: Diagnosis not present

## 2018-01-07 DIAGNOSIS — I10 Essential (primary) hypertension: Secondary | ICD-10-CM

## 2018-01-10 ENCOUNTER — Encounter (INDEPENDENT_AMBULATORY_CARE_PROVIDER_SITE_OTHER): Payer: Self-pay | Admitting: Orthopaedic Surgery

## 2018-01-10 ENCOUNTER — Ambulatory Visit (INDEPENDENT_AMBULATORY_CARE_PROVIDER_SITE_OTHER): Payer: Self-pay

## 2018-01-10 ENCOUNTER — Ambulatory Visit (INDEPENDENT_AMBULATORY_CARE_PROVIDER_SITE_OTHER): Payer: Medicare Other | Admitting: Orthopaedic Surgery

## 2018-01-10 VITALS — BP 145/90 | HR 61 | Ht 64.0 in | Wt 177.0 lb

## 2018-01-10 DIAGNOSIS — M1712 Unilateral primary osteoarthritis, left knee: Secondary | ICD-10-CM

## 2018-01-10 DIAGNOSIS — G8929 Other chronic pain: Secondary | ICD-10-CM | POA: Diagnosis not present

## 2018-01-10 DIAGNOSIS — M170 Bilateral primary osteoarthritis of knee: Secondary | ICD-10-CM | POA: Insufficient documentation

## 2018-01-10 DIAGNOSIS — M25562 Pain in left knee: Secondary | ICD-10-CM

## 2018-01-10 NOTE — Progress Notes (Signed)
Office Visit Note   Patient: Wendy Woods           Date of Birth: 09-Sep-1960           MRN: [id number] Visit Date: 01/10/2018              Requested by: Wendy Contras, MD [address] [place], [name] [address] PCP: Wendy Contras, MD   Assessment & Plan: Visit Diagnoses:  1. Chronic pain of left knee   2. Unilateral primary osteoarthritis, left knee     Plan: Advanced arthritis left knee probably a combination of rheumatoid and osteoarthritis.  I believe her arthritis is creating the majority of her pain.  Much of her discomfort is localized beneath the patella where she is experiencing mechanical symptoms.  Long discussion over 45 minutes 50% of the time in counseling regarding diagnosis and treatment options.  I think the definitive procedure would be a knee replacement.  However, I think she needs to consider other options prior to considering that surgery.  We will try physical therapy.  She is had cortisone.  We may want to consider Visco supplementation.  I do not think arthroscopy would provide any relief of her pain.  Follow-Up Instructions: No follow-ups on file.   Orders:  Orders Placed This Encounter  Procedures  . XR KNEE 3 VIEW LEFT   No orders of the defined types were placed in this encounter.     Procedures: No procedures performed   Clinical Data: No additional findings.   Subjective: Chief Complaint  Patient presents with  . New Patient (Initial Visit)    CHRONIC KNEE PAIN JUST GETTING WORSE, HAD MRI IN 07/2017 HAD KNEE SCOPED TWICE LAST TIME WAS 20 YRS AGO IN [facility]  Wendy Woods is 57 years old and visited the office for evaluation of left knee pain.  She is had problem off and on for many years and it fact is had 2 prior knee arthroscopies.  She has been followed by Wendy Woods for rheumatoid arthritis and is on several medicines.  She recently has been having more trouble with her left knee to the point of compromise.  No recent injury or  trauma.  In April had a cortisone injection that "lasted only several days".  Been experiencing pain along the medial and lateral aspect of her left knee with some recurrent effusion.  She has tried Tylenol for pain.  She is a non-insulin-dependent diabetic. Her biggest problem walking up and down stairs with anterior knee pain with occasional "catching. MRI scan of her knee in April demonstrated tricompartmental degenerative changes there was full-thickness cartilage loss over the entirety medial patella facet and inferior apex.  There are large areas of full-thickness cartilage loss over the deep trochlear groove.  There is moderate partial-thickness cartilage loss without focal defects medially but areas of full-thickness cartilage loss. laterally there was a small horizontal tear of the periphery of the posterior body of the medial meniscus.  HPI  Review of Systems  Constitutional: Negative for fatigue and fever.  HENT: Negative for ear pain.   Eyes: Negative for pain.  Respiratory: Negative for cough.   Cardiovascular: Negative for leg swelling.  Gastrointestinal: Negative for constipation and diarrhea.  Genitourinary: Negative for difficulty urinating.  Musculoskeletal: Positive for back pain and neck pain.  Skin: Negative for rash.  Allergic/Immunologic: Negative for food allergies.  Neurological: Positive for weakness. Negative for numbness.  Hematological: Does not bruise/bleed easily.  Psychiatric/Behavioral: Negative for sleep disturbance.  Objective: Vital Signs: BP (!) 145/90 (BP Location: Left Arm, Patient Position: Sitting, Cuff Size: Normal)   Pulse 61   Ht 5\' 4"  (1.626 m)   Wt 177 lb (80.3 kg)   LMP 07/09/2014   BMI 30.38 kg/m   Physical Exam  Constitutional: She is oriented to person, place, and time. She appears well-developed and well-nourished.  HENT:  Mouth/Throat: Oropharynx is clear and moist.  Eyes: Pupils are equal, round, and reactive to light. EOM  are normal.  Pulmonary/Chest: Effort normal.  Neurological: She is alert and oriented to person, place, and time.  Skin: Skin is warm and dry.  Psychiatric: She has a normal mood and affect. Her behavior is normal.    Ortho Exam awake alert and oriented x3.  Comfortable sitting.  Well-healed arthroscopic portals left knee from prior surgery.  Very minimal effusion.  Some patellar crepitation and pain with patella compression.  Has some more medial than lateral joint pain.  Full extension flexed.  Majority of pain is anterior beneath the patella where there is quite a bit of grating.  Also has as much medial joint pain is lateral joint pain with over 105 degrees of flexion and no instability.  No distal edema.  Specialty Comments:  No specialty comments available.  Imaging: No results found.   PMFS History: Patient Active Problem List   Diagnosis Date Noted  . Unilateral primary osteoarthritis, left knee 01/10/2018  . Chronic pain of left knee 01/10/2018  . DDD (degenerative disc disease), cervical 03/22/2016  . DDD (degenerative disc disease), lumbar 03/22/2016  . 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 Angioedema 07/15/2014  . Hypokalemia 07/15/2014  . Diabetes mellitus, controlled (HCC) 07/03/2012  . HTN (hypertension) 07/03/2012  . Rheumatoid arthritis (HCC) 07/03/2012  . Current chronic use of systemic steroids 07/03/2012  . 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 DDD (degenerative disc disease), cervical 03/22/2016  . DDD (degenerative disc disease), lumbar 03/22/2016  . GERD (gastroesophageal reflux disease)   . Hyperlipidemia   . Hypertension   . Rheumatoid arthritis (HCC)   . Rheumatoid arthritis(714.0)   . 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 Type II diabetes mellitus (HCC)     Family History  Problem Relation Age of Onset  . Colon polyps Father   . Prostate cancer Father   . Breast cancer Cousin 26  . Colon cancer Neg Hx     Past Surgical  History:  Procedure Laterality Date  . APPENDECTOMY  1968  . CATARACT EXTRACTION W/ INTRAOCULAR LENS  IMPLANT, BILATERAL Bilateral 11/2013-12/2013  . KNEE ARTHROPLASTY    . KNEE ARTHROSCOPY Left 1980's-1990's X 3  . MYOMECTOMY  1990's   Social History   Occupational History  . Not on file  Tobacco Use  . Smoking status: Never Smoker  . Smokeless tobacco: Never Used  Substance and Sexual Activity  . Alcohol use: No    Alcohol/week: 0.0 standard drinks  . Drug use: No  . Sexual activity: Not Currently

## 2018-01-11 ENCOUNTER — Other Ambulatory Visit: Payer: Self-pay | Admitting: *Deleted

## 2018-01-11 NOTE — Progress Notes (Signed)
Infusion orders are current for patient CBC CMP Tylenol Benadryl appointments are up to date and follow up appointment  is scheduled TB gold not due yet.  

## 2018-01-18 ENCOUNTER — Other Ambulatory Visit: Payer: Self-pay

## 2018-01-18 ENCOUNTER — Ambulatory Visit: Payer: Medicare Other | Attending: Orthopaedic Surgery | Admitting: Physical Therapy

## 2018-01-18 ENCOUNTER — Encounter: Payer: Self-pay | Admitting: Physical Therapy

## 2018-01-18 VITALS — BP 127/80 | HR 62

## 2018-01-18 DIAGNOSIS — M6281 Muscle weakness (generalized): Secondary | ICD-10-CM | POA: Insufficient documentation

## 2018-01-18 DIAGNOSIS — M25562 Pain in left knee: Secondary | ICD-10-CM | POA: Diagnosis present

## 2018-01-18 DIAGNOSIS — G8929 Other chronic pain: Secondary | ICD-10-CM

## 2018-01-18 NOTE — Patient Instructions (Signed)
Access Code: [email or web address]  URL: [email or web address]  Date: 01/18/2018   Exercises  Standing ITB Stretch - 3-5 reps - 1 sets - 30 hold - 1x daily - 7x weekly  Straight Leg Raise with External Rotation - 10 reps - 2 sets - 1x daily - 7x weekly

## 2018-01-18 NOTE — Therapy (Signed)
[facility] [address] [place], [name], [address] Phone: [phone]   Fax:  [phone]  Physical Therapy Evaluation  Patient Details  Name: Wendy Woods MRN: [id number] Date of Birth: 01/08/1961 Referring Provider: Norlene Campbell, MD   Encounter Date: 01/18/2018  PT End of Session - 01/18/18 1257    Visit Number  1    Number of Visits  8    Date for PT Re-Evaluation  02/17/18    Authorization Type  [employer] Medicare- Requires 10th visit PN    PT Start Time  1015    PT Stop Time  1100    PT Time Calculation (min)  45 min    Activity Tolerance  Patient tolerated treatment well    Behavior During Therapy  [facility] for tasks assessed/performed       Past Medical History:  Diagnosis Date  . DDD (degenerative disc disease), cervical 03/22/2016  . DDD (degenerative disc disease), lumbar 03/22/2016  . GERD (gastroesophageal reflux disease)   . Hyperlipidemia   . Hypertension   . Rheumatoid arthritis (HCC)   . Rheumatoid arthritis(714.0)   . 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 Type II diabetes mellitus (HCC)     Past Surgical History:  Procedure Laterality Date  . APPENDECTOMY  1968  . CATARACT EXTRACTION W/ INTRAOCULAR LENS  IMPLANT, BILATERAL Bilateral 11/2013-12/2013  . KNEE ARTHROPLASTY    . KNEE ARTHROSCOPY Left 1980's-1990's X 3  . MYOMECTOMY  1990's    Vitals:   01/18/18 1023  BP: 127/80  Pulse: 62     Subjective Assessment - 01/18/18 1023    Subjective  Pt reports painful and soreness in L knee especially on medial side that increases in pain with walking. Pt reports she knows she has OA and small tears that may be contributing to her pain. Pt reports stiffness in AM 2/2 RA and OA. Pt reports sitting frequently throughout day for maximum of 2 hours before having to get up and move around. Pt reports Remicade treatments are going well.      Pertinent History  RA, osteoarthritis, degenerative disc disease  of the spine, angioedema, DM, chronic use of systemic steroids, HTN, Scleritis, Hypokalemia     Limitations  Sitting;Walking;Lifting;Standing    How long can you sit comfortably?  2 hours before having to get up and move around     How long can you walk comfortably?  Pt reports ability to walk community distances with slight pain in L knee    Patient Stated Goals  Increase L LE strength; walk as much to delay L knee surgery.     Currently in Pain?  Yes   Pt reports general pain to all joints 2/2 RA   Pain Score  5     Pain Location  Knee    Pain Orientation  Left    Pain Descriptors / Indicators  Aching;Dull    Pain Type  Chronic pain    Pain Onset  More than a month ago    Pain Frequency  Constant    Aggravating Factors   sitting, standing, walk, stairs for prolonged periods. Ascending stairs worsens pain.     Pain Relieving Factors  Biofreeze          [facility] PT Assessment - 01/18/18 1031      Assessment   Medical Diagnosis  Left knee OA     Referring Provider  Norlene Campbell, MD    Onset Date/Surgical Date  01/10/18  Hand Dominance  Right    Next MD Visit  Pt reports next visit is in October to follow up with progress and determine need for surgery.     Prior Therapy  Pt reports prior physical therapy for L knee scope procedures 20 years ago and s/p tearing her L rotator cuff years ago from fishing incident       Precautions   Precautions  Fall      Restrictions   Weight Bearing Restrictions  No      Balance Screen   Has the patient fallen in the past 6 months  No    Has the patient had a decrease in activity level because of a fear of falling?   No    Is the patient reluctant to leave their home because of a fear of falling?   No      Home Nurse, mental health  Private residence    Living Arrangements  Parent    Available Help at Discharge  Family    Type of Home  House    Home Access  Level entry    Home Layout  Multi-level    Alternate Level  Stairs-Number of Steps  13     Alternate Level Stairs-Rails  Left      Prior Function   Level of Independence  Independent    Vocation  Other (comment)   need to further assess next visit   Leisure  Pt reports she is the primary caretaker of her mother who lives with her.       Cognition   Overall Cognitive Status  Within Functional Limits for tasks assessed      Observation/Other Assessments-Edema    Edema  --   L knee edema around inferior patellar region     Sensation   Light Touch  Appears Intact      Coordination   Gross Motor Movements are Fluid and Coordinated  Yes    Fine Motor Movements are Fluid and Coordinated  Yes    Heel Shin Test  limited bilaterally 2/2 weakness and tightness with hip external rotation       Posture/Postural Control   Posture/Postural Control  No significant limitations      ROM / Strength   AROM / PROM / Strength  PROM;AROM;Strength      AROM   Overall AROM   Within functional limits for tasks performed    AROM Assessment Site  --    Right/Left Hip  --    Right/Left Knee  Right;Left    Right Knee Flexion  120    Left Knee Flexion  108      PROM   Overall PROM   Deficits    PROM Assessment Site  Knee    Right/Left Knee  Right;Left    Right Knee Flexion  123    Left Knee Flexion  120       Strength   Overall Strength  Deficits    Overall Strength Comments  submaximal effort given during strength screening    Strength Assessment Site  Hip;Knee;Ankle    Right/Left Hip  Right;Left    Right Hip Flexion  4-/5    Right Hip ABduction  4/5    Right Hip ADduction  4/5    Left Hip Flexion  3/5    Left Hip ABduction  4/5    Left Hip ADduction  4/5    Right/Left Knee  Right;Left    Right Knee Flexion  4-/5    Right Knee Extension  4/5    Left Knee Flexion  3/5    Left Knee Extension  4/5    Right/Left Ankle  Right;Left    Right Ankle Dorsiflexion  4/5    Right Ankle Plantar Flexion  4/5    Left Ankle Dorsiflexion  4/5    Left Ankle  Plantar Flexion  4/5      Palpation   Patella mobility  R=L WFL. Pt reports increased pain with passive medial patellar mobility and with quad contraction on L patella      Special Tests    Special Tests  Hip Special Tests    Hip Special Tests   Ober's Test      Ober's Test   Findings  Positive    Side  Left    Comments  Indicative of TFL tightness contributing to increase forces placed on IT band's distal insertion to Gerdy's tubercle which patient reports pain with palpation      Transfers   Transfers  Sit to Stand;Stand to Sit;Stand Pivot Transfers    Sit to Stand  7: Independent    Stand to Sit  7: Independent    Stand Pivot Transfers  7: Independent      Ambulation/Gait   Ambulation/Gait  Yes    Ambulation/Gait Assistance  7: Independent    Ambulation Distance (Feet)  10 Feet    Assistive device  None    Gait Pattern  Step-through pattern;Decreased arm swing - right;Decreased arm swing - left;Decreased step length - right;Decreased step length - left;Decreased stride length;Decreased trunk rotation    Ambulation Surface  Level;Indoor    Gait velocity  2.64 ft/sec indicative of community ambulator       Standardized Balance Assessment   Standardized Balance Assessment  Timed Up and Go Test      Timed Up and Go Test   TUG  Normal TUG    Normal TUG (seconds)  10.29   indicative of normal fall risk potential       Objective measurements completed on examination: See above findings.    Skilled Physical Therapy Intervention: Patient performs the following exercises to increase LE flexibility and strengthening demonstrating and verbalizing understanding.    Exercises  Standing ITB Stretch - 3-5 reps - 1 sets - 30 hold - 1x daily - 7x weekly  Straight Leg Raise with External Rotation - 10 reps - 2 sets - 1x daily - 7x weekly      PT Education - 01/18/18 1255    Education Details  Therapist provided education on clinical findings, HEP, POC moving forward, and review  of radiographic x-ray findings of L knee.     Person(s) Educated  Patient    Methods  Explanation    Comprehension  Verbalized understanding       PT Short Term Goals - 01/18/18 1309      PT SHORT TERM GOAL #1   Title  STG=LTG'S        PT Long Term Goals - 01/18/18 1310      PT LONG TERM GOAL #1   Title  Pt will demonstrate independence with HEP to progress with LE strengthening and improved flexibility     Time  4    Period  Weeks    Status  New    Target Date  02/17/18      PT LONG TERM GOAL #2   Title  Pt will improve gait speed to >/= 4.37 ft/sec  indicative of normal walking speed and improved functional mobility     Baseline  9/20- 2.64 ft/sec     Time  4    Period  Weeks    Status  New    Target Date  02/17/18      PT LONG TERM GOAL #3   Title  Pt will ambulate >1000 ft outdoors navigating curbs, ramps, inclines, and grassy surfaces with no AD and Mod I increasing functional independence with community distances.     Time  4    Period  Weeks    Status  New    Target Date  02/17/18      PT LONG TERM GOAL #4   Title  Pt will perform stair negotiation consisting of 12 steps using single HR with reciprocal stepping pattern with <2 points on NPRS indicating improvement with functional mobility and pain reduction    Baseline  5/10 pain when ascending stairs using single HR     Time  4    Period  Weeks    Status  New    Target Date  02/17/18      PT LONG TERM GOAL #5   Title  Pt will increase L knee flexion AROM by 5-8 degrees to demonstrate improvements in flexibility     Baseline  L knee flexion AROM currently 108 degrees     Time  4    Period  Weeks    Status  New    Target Date  02/17/18      Additional Long Term Goals   Additional Long Term Goals  Yes      PT LONG TERM GOAL #6   Title  Pt will increase LE strength to overall 4/5 when assessed in sitting to demonstrate improvement in LE strengthening     Baseline  see strength column on evaluation tab      Time  4    Period  Weeks    Status  New    Target Date  02/17/18      PT LONG TERM GOAL #7   Title  --    Baseline  --    Time  --    Period  --    Status  --    Target Date  --          Plan - 01/18/18 1258    Clinical Impression Statement  Pt is a 57 year old female referred to Neuro OPPT for evaluation of left knee osteoarthritis. Pt's PMH includes RA, osteoarthritis, degenerative disc disease of the spine, angioedema, DM, chronic use of systemic steroids, HTN, Scleritis, and hypokalemia. The following deficits were noted during pt's exam: Pt demonstrates LE strength deficits bilaterally L>R during general strength screen in sitting, deficits to knee flexion ROM bilaterally greater on L > R when measured in supine, and positive L Ober's sign indicative of tightness to L TFL and IT band, which may be contributing to patient's pain to lateral knee upon palpation. Pt demonstrates gait speed of 2.64 ft/sec which is indicative of a community ambulator and a TUG time of 10.29 seconds indicating normal fall risk potential. Pt would benefit from skilled PT to address these impairments and functional limitations to maximize functional mobility independence.     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  Clinical Presentation  Stable    Clinical Presentation due to:   RA, osteoarthritis, degenerative disc disease of the spine, angioedema, DM, chronic use of systemic steroids, HTN, Scleritis, Hypokalemia     Clinical Decision Making  Low    Rehab Potential  Good    Clinical Impairments Affecting Rehab Potential   RA, osteoarthritis, degenerative disc disease of the spine, angioedema, DM, chronic use of systemic steroids, HTN, Scleritis, Hypokalemia     PT Frequency  2x / week    PT Duration  4 weeks    PT  Treatment/Interventions  ADLs/Self Care Home Management;Gait training;Passive range of motion;Manual techniques;Stair training;Functional mobility training;Therapeutic activities;Patient/family education;Energy conservation;Taping;Therapeutic exercise;Electrical Stimulation;Balance training;Moist Heat;Cryotherapy;Iontophoresis 4mg /ml Dexamethasone    PT Next Visit Plan  establish HEP, LE strengthening, assess hip flexor tightness and implement LE flexibility exercises, ascending stairs with R HR, community ambulation, dynamic balance     PT Home Exercise Plan  [id number]    Consulted and Agree with Plan of Care  Patient       Patient will benefit from skilled therapeutic intervention in order to improve the following deficits and impairments:  Improper body mechanics, Pain, Decreased mobility, Decreased range of motion, Decreased strength, Hypomobility, Impaired perceived functional ability, Decreased balance, Decreased safety awareness, Difficulty walking, Increased edema, Impaired flexibility  Visit Diagnosis: Chronic pain of left knee  Muscle weakness (generalized)     Problem List Patient Active Problem List   Diagnosis Date Noted  . Unilateral primary osteoarthritis, left knee 01/10/2018  . Chronic pain of left knee 01/10/2018  . DDD (degenerative disc disease), cervical 03/22/2016  . DDD (degenerative disc disease), lumbar 03/22/2016  . 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 Angioedema 07/15/2014  . Hypokalemia 07/15/2014  . Diabetes mellitus, controlled (HCC) 07/03/2012  . HTN (hypertension) 07/03/2012  . Rheumatoid arthritis (HCC) 07/03/2012  . Current chronic use of systemic steroids 07/03/2012  . 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

## 2018-01-22 ENCOUNTER — Ambulatory Visit: Payer: Medicare Other | Admitting: Physical Therapy

## 2018-01-22 ENCOUNTER — Encounter: Payer: Self-pay | Admitting: Physical Therapy

## 2018-01-22 DIAGNOSIS — M6281 Muscle weakness (generalized): Secondary | ICD-10-CM

## 2018-01-22 DIAGNOSIS — M25562 Pain in left knee: Principal | ICD-10-CM

## 2018-01-22 DIAGNOSIS — G8929 Other chronic pain: Secondary | ICD-10-CM

## 2018-01-22 NOTE — Therapy (Signed)
[facility] [address] [place], [name], [address] Phone: [phone]   Fax:  [phone]  Physical Therapy Treatment  Patient Details  Name: Wendy Woods MRN: [id number] Date of Birth: Sep 28, 1960 Referring Provider: Norlene Campbell, MD   Encounter Date: 01/22/2018  PT End of Session - 01/22/18 0934    Visit Number  2    Number of Visits  8    Date for PT Re-Evaluation  02/17/18    Authorization Type  [employer] Medicare- Requires 10th visit PN    PT Start Time  0932    PT Stop Time  1015    PT Time Calculation (min)  43 min    Activity Tolerance  Patient tolerated treatment well    Behavior During Therapy  [facility] for tasks assessed/performed       Past Medical History:  Diagnosis Date  . DDD (degenerative disc disease), cervical 03/22/2016  . DDD (degenerative disc disease), lumbar 03/22/2016  . GERD (gastroesophageal reflux disease)   . Hyperlipidemia   . Hypertension   . Rheumatoid arthritis (HCC)   . Rheumatoid arthritis(714.0)   . 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 Type II diabetes mellitus (HCC)     Past Surgical History:  Procedure Laterality Date  . APPENDECTOMY  1968  . CATARACT EXTRACTION W/ INTRAOCULAR LENS  IMPLANT, BILATERAL Bilateral 11/2013-12/2013  . KNEE ARTHROPLASTY    . KNEE ARTHROSCOPY Left 1980's-1990's X 3  . MYOMECTOMY  1990's    There were no vitals filed for this visit.  Subjective Assessment - 01/22/18 0934    Subjective  knee is bothering her some - soreness; was able to do HEP    Pertinent History  RA, osteoarthritis, degenerative disc disease of the spine, angioedema, DM, chronic use of systemic steroids, HTN, Scleritis, Hypokalemia     Patient Stated Goals  Increase L LE strength; walk as much to delay L knee surgery.     Currently in Pain?  Yes    Pain Score  5     Pain Location  Knee    Pain Orientation  Left    Pain Descriptors / Indicators  Aching;Sore    Pain Type   Chronic pain                       [facility] Adult PT Treatment/Exercise - 01/22/18 0936      Exercises   Exercises  Knee/Hip      Knee/Hip Exercises: Stretches   Passive Hamstring Stretch  Left;3 reps;30 seconds    Passive Hamstring Stretch Limitations  seated forward bend      Knee/Hip Exercises: Standing   Heel Raises  Both;15 reps    Heel Raises Limitations  B UE support    Hip Flexion  Stengthening;Left;15 reps;Knee bent;Knee straight    Hip Flexion Limitations  2# - 1 set each knee bent/straight    Hip Abduction  Stengthening;Left;15 reps;Knee straight    Abduction Limitations  2#      Knee/Hip Exercises: Seated   Long Arc Quad  Strengthening;Left;15 reps    Long Arc Quad Limitations  + ball squeeze    Hamstring Curl  Strengthening;Left;15 reps    Hamstring Limitations  green tband    Sit to [employer]  15 reps;without UE support      Knee/Hip Exercises: Supine   Bridges  Both;15 reps    Bridges with [employer]  Both;15 reps    Straight Leg Raises  Left;15 reps  Straight Leg Raise with External Rotation  Left;15 reps    Straight Leg Raise with External Rotation Limitations  compensation at R hip extrensors               PT Short Term Goals - 01/18/18 1309      PT SHORT TERM GOAL #1   Title  STG=LTG'S        PT Long Term Goals - 01/18/18 1310      PT LONG TERM GOAL #1   Title  Pt will demonstrate independence with HEP to progress with LE strengthening and improved flexibility     Time  4    Period  Weeks    Status  New    Target Date  02/17/18      PT LONG TERM GOAL #2   Title  Pt will improve gait speed to >/= 4.37 ft/sec indicative of normal walking speed and improved functional mobility     Baseline  9/20- 2.64 ft/sec     Time  4    Period  Weeks    Status  New    Target Date  02/17/18      PT LONG TERM GOAL #3   Title  Pt will ambulate >1000 ft outdoors navigating curbs, ramps, inclines, and grassy surfaces with no AD and Mod I  increasing functional independence with community distances.     Time  4    Period  Weeks    Status  New    Target Date  02/17/18      PT LONG TERM GOAL #4   Title  Pt will perform stair negotiation consisting of 12 steps using single HR with reciprocal stepping pattern with <2 points on NPRS indicating improvement with functional mobility and pain reduction    Baseline  5/10 pain when ascending stairs using single HR     Time  4    Period  Weeks    Status  New    Target Date  02/17/18      PT LONG TERM GOAL #5   Title  Pt will increase L knee flexion AROM by 5-8 degrees to demonstrate improvements in flexibility     Baseline  L knee flexion AROM currently 108 degrees     Time  4    Period  Weeks    Status  New    Target Date  02/17/18      Additional Long Term Goals   Additional Long Term Goals  Yes      PT LONG TERM GOAL #6   Title  Pt will increase LE strength to overall 4/5 when assessed in sitting to demonstrate improvement in LE strengthening     Baseline  see strength column on evaluation tab     Time  4    Period  Weeks    Status  New    Target Date  02/17/18      PT LONG TERM GOAL #7   Title  --    Baseline  --    Time  --    Period  --    Status  --    Target Date  --            Plan - 01/22/18 1016    Clinical Impression Statement  patient reporting good compliance form limited HEP from last visit. Skilled PT session today focusing on isolating muscle groups with a goal of strengthening. Patient requires multiple verbal/tactile cues thorughout session for controlled  form of movement as patient relies on momentum and compensations. Patient with appropraite muscle soreness during/following session. WIll continue to progress towards goals.     Rehab Potential  Good    Clinical Impairments Affecting Rehab Potential   RA, osteoarthritis, degenerative disc disease of the spine, angioedema, DM, chronic use of systemic steroids, HTN, Scleritis, Hypokalemia      PT Frequency  2x / week    PT Duration  4 weeks    PT Treatment/Interventions  ADLs/Self Care Home Management;Gait training;Passive range of motion;Manual techniques;Stair training;Functional mobility training;Therapeutic activities;Patient/family education;Energy conservation;Taping;Therapeutic exercise;Electrical Stimulation;Balance training;Moist Heat;Cryotherapy;Iontophoresis 4mg /ml Dexamethasone    PT Next Visit Plan  more upright/functional strengthening, balance    PT Home Exercise Plan  [id number], [id number]    Consulted and Agree with Plan of Care  Patient       Patient will benefit from skilled therapeutic intervention in order to improve the following deficits and impairments:  Improper body mechanics, Pain, Decreased mobility, Decreased range of motion, Decreased strength, Hypomobility, Impaired perceived functional ability, Decreased balance, Decreased safety awareness, Difficulty walking, Increased edema, Impaired flexibility  Visit Diagnosis: Chronic pain of left knee  Muscle weakness (generalized)     Problem List Patient Active Problem List   Diagnosis Date Noted  . Unilateral primary osteoarthritis, left knee 01/10/2018  . Chronic pain of left knee 01/10/2018  . DDD (degenerative disc disease), cervical 03/22/2016  . DDD (degenerative disc disease), lumbar 03/22/2016  . 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 Angioedema 07/15/2014  . Hypokalemia 07/15/2014  . Diabetes mellitus, controlled (HCC) 07/03/2012  . HTN (hypertension) 07/03/2012  . Rheumatoid arthritis (HCC) 07/03/2012  . Current chronic use of systemic steroids 07/03/2012  . 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

## 2018-01-25 ENCOUNTER — Ambulatory Visit: Payer: Medicare Other | Admitting: Physical Therapy

## 2018-01-25 ENCOUNTER — Encounter: Payer: Self-pay | Admitting: Physical Therapy

## 2018-01-25 DIAGNOSIS — M25562 Pain in left knee: Principal | ICD-10-CM

## 2018-01-25 DIAGNOSIS — G8929 Other chronic pain: Secondary | ICD-10-CM

## 2018-01-25 DIAGNOSIS — M6281 Muscle weakness (generalized): Secondary | ICD-10-CM

## 2018-01-25 NOTE — Therapy (Signed)
[facility] [address] [place], [name], [address] Phone: [phone]   Fax:  [phone]  Physical Therapy Treatment  Patient Details  Name: Wendy Woods MRN: [id number] Date of Birth: 06-Apr-1961 Referring Provider (PT): Norlene Campbell, MD   Encounter Date: 01/25/2018  PT End of Session - 01/25/18 0759    Visit Number  3    Number of Visits  8    Date for PT Re-Evaluation  02/17/18    Authorization Type  [employer] Medicare- Requires 10th visit PN    PT Start Time  0757    PT Stop Time  0840    PT Time Calculation (min)  43 min    Activity Tolerance  Patient tolerated treatment well    Behavior During Therapy  [facility] for tasks assessed/performed       Past Medical History:  Diagnosis Date  . DDD (degenerative disc disease), cervical 03/22/2016  . DDD (degenerative disc disease), lumbar 03/22/2016  . GERD (gastroesophageal reflux disease)   . Hyperlipidemia   . Hypertension   . Rheumatoid arthritis (HCC)   . Rheumatoid arthritis(714.0)   . 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 Type II diabetes mellitus (HCC)     Past Surgical History:  Procedure Laterality Date  . APPENDECTOMY  1968  . CATARACT EXTRACTION W/ INTRAOCULAR LENS  IMPLANT, BILATERAL Bilateral 11/2013-12/2013  . KNEE ARTHROPLASTY    . KNEE ARTHROSCOPY Left 1980's-1990's X 3  . MYOMECTOMY  1990's    There were no vitals filed for this visit.  Subjective Assessment - 01/25/18 0755    Subjective  was at [facility] in the [facility] yesterday - on her feet a lot; just having some soreness today    Pertinent History  RA, osteoarthritis, degenerative disc disease of the spine, angioedema, DM, chronic use of systemic steroids, HTN, Scleritis, Hypokalemia     Patient Stated Goals  Increase L LE strength; walk as much to delay L knee surgery.     Currently in Pain?  Yes    Pain Score  4     Pain Location  Knee    Pain Orientation  Left    Pain Descriptors /  Indicators  Aching;Sore    Pain Type  Chronic pain                       [facility] Adult PT Treatment/Exercise - 01/25/18 0001      Exercises   Exercises  Knee/Hip      Knee/Hip Exercises: Stretches   Passive Hamstring Stretch  Left;3 reps;30 seconds    Passive Hamstring Stretch Limitations  supine with strap    Quad Stretch  Left;3 reps;30 seconds    Quad Stretch Limitations  prone with strap      Knee/Hip Exercises: Standing   Terminal Knee Extension  Left;15 reps    Forward Step Up  Left;2 sets;10 reps;Hand Hold: 1;Step Height: 6"    Step Down  Left;2 sets;10 reps;Hand Hold: 1;Step Height: 4"    Wall Squat  15 reps    Wall Squat Limitations  VC for form      Knee/Hip Exercises: Seated   Long Arc Quad  Strengthening;Left;15 reps;Weights    Long Arc Quad Weight  2 lbs.    Long [employer] Limitations  + ball squeeze      Knee/Hip Exercises: Supine   Straight Leg Raises  Left;10 reps    Straight Leg Raises Limitations  2#  Knee/Hip Exercises: Sidelying   Hip ABduction  Strengthening;Left;2 sets;15 reps    Hip ABduction Limitations  2#               PT Short Term Goals - 01/18/18 1309      PT SHORT TERM GOAL #1   Title  STG=LTG'S        PT Long Term Goals - 01/18/18 1310      PT LONG TERM GOAL #1   Title  Pt will demonstrate independence with HEP to progress with LE strengthening and improved flexibility     Time  4    Period  Weeks    Status  New    Target Date  02/17/18      PT LONG TERM GOAL #2   Title  Pt will improve gait speed to >/= 4.37 ft/sec indicative of normal walking speed and improved functional mobility     Baseline  9/20- 2.64 ft/sec     Time  4    Period  Weeks    Status  New    Target Date  02/17/18      PT LONG TERM GOAL #3   Title  Pt will ambulate >1000 ft outdoors navigating curbs, ramps, inclines, and grassy surfaces with no AD and Mod I increasing functional independence with community distances.     Time  4     Period  Weeks    Status  New    Target Date  02/17/18      PT LONG TERM GOAL #4   Title  Pt will perform stair negotiation consisting of 12 steps using single HR with reciprocal stepping pattern with <2 points on NPRS indicating improvement with functional mobility and pain reduction    Baseline  5/10 pain when ascending stairs using single HR     Time  4    Period  Weeks    Status  New    Target Date  02/17/18      PT LONG TERM GOAL #5   Title  Pt will increase L knee flexion AROM by 5-8 degrees to demonstrate improvements in flexibility     Baseline  L knee flexion AROM currently 108 degrees     Time  4    Period  Weeks    Status  New    Target Date  02/17/18      Additional Long Term Goals   Additional Long Term Goals  Yes      PT LONG TERM GOAL #6   Title  Pt will increase LE strength to overall 4/5 when assessed in sitting to demonstrate improvement in LE strengthening     Baseline  see strength column on evaluation tab     Time  4    Period  Weeks    Status  New    Target Date  02/17/18      PT LONG TERM GOAL #7   Title  --    Baseline  --    Time  --    Period  --    Status  --    Target Date  --            Plan - 01/25/18 0800    Clinical Impression Statement  Patient reporting increased activity levels this week with good tolerance. Skiiled PT session today focusing on more upright functional strengthening. Does require standing rest breaks throughout session due to fatigue. Difficulty with passive HS stretch today as patient wants  to activate hip flexor group limiting stretch. Making good progress toawrds goals.     Rehab Potential  Good    Clinical Impairments Affecting Rehab Potential   RA, osteoarthritis, degenerative disc disease of the spine, angioedema, DM, chronic use of systemic steroids, HTN, Scleritis, Hypokalemia     PT Frequency  2x / week    PT Duration  4 weeks    PT Treatment/Interventions  ADLs/Self Care Home Management;Gait  training;Passive range of motion;Manual techniques;Stair training;Functional mobility training;Therapeutic activities;Patient/family education;Energy conservation;Taping;Therapeutic exercise;Electrical Stimulation;Balance training;Moist Heat;Cryotherapy;Iontophoresis 4mg /ml Dexamethasone    PT Next Visit Plan  more upright/functional strengthening, balance    PT Home Exercise Plan  [email or web address], [email or web address]    Consulted and Agree with Plan of Care  Patient       Patient will benefit from skilled therapeutic intervention in order to improve the following deficits and impairments:  Improper body mechanics, Pain, Decreased mobility, Decreased range of motion, Decreased strength, Hypomobility, Impaired perceived functional ability, Decreased balance, Decreased safety awareness, Difficulty walking, Increased edema, Impaired flexibility  Visit Diagnosis: Chronic pain of left knee  Muscle weakness (generalized)     Problem List Patient Active Problem List   Diagnosis Date Noted  . Unilateral primary osteoarthritis, left knee 01/10/2018  . Chronic pain of left knee 01/10/2018  . DDD (degenerative disc disease), cervical 03/22/2016  . DDD (degenerative disc disease), lumbar 03/22/2016  . 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 Angioedema 07/15/2014  . Hypokalemia 07/15/2014  . Diabetes mellitus, controlled (HCC) 07/03/2012  . HTN (hypertension) 07/03/2012  . Rheumatoid arthritis (HCC) 07/03/2012  . Current chronic use of systemic steroids 07/03/2012  . 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 [place], [place], [name] Phone: [phone]   Fax:  [phone]  Name: Wendy Woods MRN: Estrella Deeds Date of Birth: [birth year]

## 2018-02-04 ENCOUNTER — Ambulatory Visit (HOSPITAL_COMMUNITY)
Admission: RE | Admit: 2018-02-04 | Discharge: 2018-02-04 | Disposition: A | Discharge status: Home / Self Care | Payer: Medicare Other | Source: Ambulatory Visit | Attending: Rheumatology | Admitting: Rheumatology

## 2018-02-04 DIAGNOSIS — M0579 Rheumatoid arthritis with rheumatoid factor of multiple sites without organ or systems involvement: Secondary | ICD-10-CM | POA: Diagnosis present

## 2018-02-04 LAB — COMPREHENSIVE METABOLIC PANEL
ALBUMIN: 3.6 g/dL (ref 3.5–5.0)
ALK PHOS: 57 U/L (ref 38–126)
ALT: 23 U/L (ref 0–44)
AST: 22 U/L (ref 15–41)
Anion gap: 10 (ref 5–15)
BUN: 14 mg/dL (ref 6–20)
CO2: 29 mmol/L (ref 22–32)
CREATININE: 1.08 mg/dL — AB (ref 0.44–1.00)
Calcium: 9.6 mg/dL (ref 8.9–10.3)
Chloride: 95 mmol/L — ABNORMAL LOW (ref 98–111)
GFR, EST NON AFRICAN AMERICAN: 56 mL/min — AB (ref >60)
Glucose, Bld: 200 mg/dL — ABNORMAL HIGH (ref 70–99)
Potassium: 3.4 mmol/L — ABNORMAL LOW (ref 3.5–5.1)
Sodium: 134 mmol/L — ABNORMAL LOW (ref 135–145)
TOTAL PROTEIN: 7.6 g/dL (ref 6.5–8.1)
Total Bilirubin: 0.8 mg/dL (ref 0.3–1.2)

## 2018-02-04 LAB — CBC
HEMATOCRIT: 39.6 % (ref 36.0–46.0)
HEMOGLOBIN: 13.1 g/dL (ref 12.0–15.0)
MCH: 30.5 pg (ref 26.0–34.0)
MCHC: 33.1 g/dL (ref 30.0–36.0)
MCV: 92.1 fL (ref 78.0–100.0)
Platelets: 349 10*3/uL (ref 150–400)
RBC: 4.3 MIL/uL (ref 3.87–5.11)
RDW: 13.5 % (ref 11.5–15.5)
WBC: 7.6 10*3/uL (ref 4.0–10.5)

## 2018-02-04 MED ORDER — ACETAMINOPHEN 325 MG PO TABS
650.0000 mg | ORAL_TABLET | ORAL | Status: AC
  Administered 2018-02-04: 650 mg via ORAL

## 2018-02-04 MED ORDER — ACETAMINOPHEN 325 MG PO TABS
ORAL_TABLET | ORAL | Status: AC
  Filled 2018-02-04: qty 2

## 2018-02-04 MED ORDER — DIPHENHYDRAMINE HCL 25 MG PO CAPS
ORAL_CAPSULE | ORAL | Status: AC
  Filled 2018-02-04: qty 1

## 2018-02-04 MED ORDER — DIPHENHYDRAMINE HCL 25 MG PO CAPS
25.0000 mg | ORAL_CAPSULE | ORAL | Status: AC
  Administered 2018-02-04: 25 mg via ORAL

## 2018-02-04 MED ORDER — SODIUM CHLORIDE 0.9 % IV SOLN
6.0000 mg/kg | INTRAVENOUS | Status: DC
  Administered 2018-02-04: 500 mg via INTRAVENOUS
  Filled 2018-02-04: qty 50

## 2018-02-04 NOTE — Progress Notes (Signed)
stable °

## 2018-02-13 ENCOUNTER — Encounter: Payer: Self-pay | Admitting: Physical Therapy

## 2018-02-13 ENCOUNTER — Ambulatory Visit: Payer: Medicare Other | Attending: Orthopaedic Surgery | Admitting: Physical Therapy

## 2018-02-13 DIAGNOSIS — M25562 Pain in left knee: Secondary | ICD-10-CM | POA: Insufficient documentation

## 2018-02-13 DIAGNOSIS — M6281 Muscle weakness (generalized): Secondary | ICD-10-CM | POA: Diagnosis present

## 2018-02-13 DIAGNOSIS — R29898 Other symptoms and signs involving the musculoskeletal system: Secondary | ICD-10-CM | POA: Diagnosis present

## 2018-02-13 DIAGNOSIS — G8929 Other chronic pain: Secondary | ICD-10-CM

## 2018-02-13 NOTE — Patient Instructions (Signed)
Access Code: [email or web address]  URL: [email or web address]  Date: 02/13/2018  Prepared by: Bufford Lope   Exercises  Seated Long Arc Quad with Hip Adduction - 10 reps - 2 sets - 1x daily - 7x weekly  Sit to Stand with Arms Crossed - 10 reps - 2 sets - 1x daily - 7x weekly  Standing Hip Abduction - 10 reps - 2 sets - 1x daily - 7x weekly  Standing Hip Flexion March - 10 reps - 2 sets - 1x daily - 7x weekly  Alternating Single Leg Bridge - 10 reps - 2 sets - 1x daily - 7x weekly  Bridge with Hip Abduction and Resistance - 10 reps - 2 sets - 1x daily - 7x weekly

## 2018-02-13 NOTE — Therapy (Signed)
[facility] [address] [place], [name], [address] Phone: [phone]   Fax:  [phone]  Physical Therapy Treatment  Patient Details  Name: Wendy Woods MRN: [id number] Date of Birth: 01-18-1961 Referring Provider (PT): Norlene Campbell, MD   Encounter Date: 02/13/2018  PT End of Session - 02/13/18 2138    Visit Number  4    Number of Visits  8    Date for PT Re-Evaluation  02/24/18   date extended x 1 week due to pt missing 2 weeks   Authorization Type  [employer]- Requires 10th visit PN    PT Start Time  1317    PT Stop Time  1405    PT Time Calculation (min)  48 min    Activity Tolerance  Patient tolerated treatment well    Behavior During Therapy  [facility] for tasks assessed/performed       Past Medical History:  Diagnosis Date  . DDD (degenerative disc disease), cervical 03/22/2016  . DDD (degenerative disc disease), lumbar 03/22/2016  . GERD (gastroesophageal reflux disease)   . Hyperlipidemia   . Hypertension   . Rheumatoid arthritis (HCC)   . Rheumatoid arthritis(714.0)   . 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 Type II diabetes mellitus (HCC)     Past Surgical History:  Procedure Laterality Date  . APPENDECTOMY  1968  . CATARACT EXTRACTION W/ INTRAOCULAR LENS  IMPLANT, BILATERAL Bilateral 11/2013-12/2013  . KNEE ARTHROPLASTY    . KNEE ARTHROSCOPY Left 1980's-1990's X 3  . MYOMECTOMY  1990's    There were no vitals filed for this visit.  Subjective Assessment - 02/13/18 1322    Subjective  Having increased pain in L knee today due to weather.  Has been doing her exercises regularly but doesn't notice a big difference in the size of her quad muscles.      Pertinent History  RA, osteoarthritis, degenerative disc disease of the spine, angioedema, DM, chronic use of systemic steroids, HTN, Scleritis, Hypokalemia     Patient Stated Goals  Increase L LE strength; walk as much to delay L knee surgery.      Currently in Pain?  Yes    Pain Score  5     Pain Location  Knee    Pain Orientation  Left    Pain Descriptors / Indicators  Aching;Sore    Pain Type  Chronic pain      Revised current HEP and had pt return demonstrate the following exercises: Access Code: [email or web address]  URL: [email or web address]  Date: 02/13/2018  Prepared by: Bufford Lope   Exercises  Seated Long Arc Quad with Hip Adduction - 10 reps - 2 sets - 1x daily - 7x weekly  Sit to Stand with Arms Crossed - 10 reps - 2 sets - 1x daily - 7x weekly  Standing Hip Abduction - 10 reps - 2 sets - 1x daily - 7x weekly  Standing Hip Flexion March - 10 reps - 2 sets - 1x daily - 7x weekly  Alternating Single Leg Bridge - 10 reps - 2 sets - 1x daily - 7x weekly  Bridge with Hip Abduction and Resistance - 10 reps - 2 sets - 1x daily - 7x weekly   Did not adjust standing hip ABD and hip flexion today; will revise next session.  Will print off new HEP once revisions and review are complete.   [facility] Adult PT Treatment/Exercise - 02/13/18 2146      Exercises   Exercises  Knee/Hip      Knee/Hip Exercises: Standing   Lateral Step Up  Right;Left;1 set;10 reps;Hand Hold: 0;Step Height: 6"    Lateral Step Up Limitations  increased difficulty with L lateral step ups; unable to perform closed chain hip ABD on L to maintain level pelvis.  Unable to activate glute med even with extensive tactile, visual and verbal cues to sequence             PT Education - 02/13/18 2138    Education Details  updated HEP    Person(s) Educated  Patient    Methods  Explanation;Demonstration    Comprehension  Verbalized understanding;Returned demonstration;Need further instruction       PT Short Term Goals - 01/18/18 1309      PT SHORT TERM GOAL #1   Title  STG=LTG'S        PT Long Term Goals - 02/13/18 2140      PT LONG TERM GOAL #1   Title  Pt will demonstrate independence with HEP to progress with LE strengthening and  improved flexibility   (LTG date extended to 02/24/18 due to pt missing 2 weeks of therapy)    Time  4    Period  Weeks    Status  New      PT LONG TERM GOAL #2   Title  Pt will improve gait speed to >/= 4.37 ft/sec indicative of normal walking speed and improved functional mobility     Baseline  9/20- 2.64 ft/sec     Time  4    Period  Weeks    Status  New      PT LONG TERM GOAL #3   Title  Pt will ambulate >1000 ft outdoors navigating curbs, ramps, inclines, and grassy surfaces with no AD and Mod I increasing functional independence with community distances.     Time  4    Period  Weeks    Status  New      PT LONG TERM GOAL #4   Title  Pt will perform stair negotiation consisting of 12 steps using single HR with reciprocal stepping pattern with <2 points on NPRS indicating improvement with functional mobility and pain reduction    Baseline  5/10 pain when ascending stairs using single HR     Time  4    Period  Weeks    Status  New      PT LONG TERM GOAL #5   Title  Pt will increase L knee flexion AROM by 5-8 degrees to demonstrate improvements in flexibility     Baseline  L knee flexion AROM currently 108 degrees     Time  4    Period  Weeks    Status  New      PT LONG TERM GOAL #6   Title  Pt will increase LE strength to overall 4/5 when assessed in sitting to demonstrate improvement in LE strengthening     Baseline  see strength column on evaluation tab     Time  4    Period  Weeks    Status  New            Plan - 02/13/18 2140    Clinical Impression Statement  Focused on updating and progressing current HEP by adding resistance of theraband, co-contraction of muscles at hip and knee and functional strengthening in WB positions.  Pt tolerated well with no increase in pain but demonstrated significant difficulty performing closed chain hip ABD  during lateral step ups on L side.  Will continue to address and strengthen in order to progress pt towards LTG.    Rehab  Potential  Good    Clinical Impairments Affecting Rehab Potential   RA, osteoarthritis, degenerative disc disease of the spine, angioedema, DM, chronic use of systemic steroids, HTN, Scleritis, Hypokalemia     PT Frequency  2x / week    PT Duration  4 weeks    PT Treatment/Interventions  ADLs/Self Care Home Management;Gait training;Passive range of motion;Manual techniques;Stair training;Functional mobility training;Therapeutic activities;Patient/family education;Energy conservation;Taping;Therapeutic exercise;Electrical Stimulation;Balance training;Moist Heat;Cryotherapy;Iontophoresis 4mg /ml Dexamethasone    PT Next Visit Plan  continue to progress HEP: forward and lateral step ups - L glute med training.  more upright/functional strengthening, balance    PT Home Exercise Plan  [email or web address], [email or web address]    Consulted and Agree with Plan of Care  Patient       Patient will benefit from skilled therapeutic intervention in order to improve the following deficits and impairments:  Improper body mechanics, Pain, Decreased mobility, Decreased range of motion, Decreased strength, Hypomobility, Impaired perceived functional ability, Decreased balance, Decreased safety awareness, Difficulty walking, Increased edema, Impaired flexibility  Visit Diagnosis: Chronic pain of left knee  Muscle weakness (generalized)     Problem List Patient Active Problem List   Diagnosis Date Noted  . Unilateral primary osteoarthritis, left knee 01/10/2018  . Chronic pain of left knee 01/10/2018  . DDD (degenerative disc disease), cervical 03/22/2016  . DDD (degenerative disc disease), lumbar 03/22/2016  . 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 Angioedema 07/15/2014  . Hypokalemia 07/15/2014  . Diabetes mellitus, controlled (HCC) 07/03/2012  . HTN (hypertension) 07/03/2012  . Rheumatoid arthritis (HCC) 07/03/2012  . Current chronic use of systemic steroids 07/03/2012  . 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 [place], [name], [address] Phone: [phone]   Fax:  [phone]  Name: Wendy Woods MRN: [id number] Date of Birth: Oct 17, 1960

## 2018-02-15 ENCOUNTER — Ambulatory Visit: Payer: Medicare Other | Admitting: Physical Therapy

## 2018-02-15 DIAGNOSIS — M25562 Pain in left knee: Principal | ICD-10-CM

## 2018-02-15 DIAGNOSIS — M6281 Muscle weakness (generalized): Secondary | ICD-10-CM

## 2018-02-15 DIAGNOSIS — G8929 Other chronic pain: Secondary | ICD-10-CM

## 2018-02-15 NOTE — Therapy (Signed)
[facility] [address] [place], [name], [address] Phone: [phone]   Fax:  [phone]  Physical Therapy Treatment  Patient Details  Name: Wendy Woods MRN: [id number] Date of Birth: 08-19-1960 Referring Provider (PT): Norlene Campbell, MD   Encounter Date: 02/15/2018  PT End of Session - 02/15/18 0810    Visit Number  5    Number of Visits  8    Date for PT Re-Evaluation  02/24/18    Authorization Type  [employer] Medicare- Requires 10th visit PN    PT Start Time  0800    PT Stop Time  0845    PT Time Calculation (min)  45 min    Activity Tolerance  Patient tolerated treatment well    Behavior During Therapy  [facility] for tasks assessed/performed       Past Medical History:  Diagnosis Date  . DDD (degenerative disc disease), cervical 03/22/2016  . DDD (degenerative disc disease), lumbar 03/22/2016  . GERD (gastroesophageal reflux disease)   . Hyperlipidemia   . Hypertension   . Rheumatoid arthritis (HCC)   . Rheumatoid arthritis(714.0)   . 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 Type II diabetes mellitus (HCC)     Past Surgical History:  Procedure Laterality Date  . APPENDECTOMY  1968  . CATARACT EXTRACTION W/ INTRAOCULAR LENS  IMPLANT, BILATERAL Bilateral 11/2013-12/2013  . KNEE ARTHROPLASTY    . KNEE ARTHROSCOPY Left 1980's-1990's X 3  . MYOMECTOMY  1990's    There were no vitals filed for this visit.  Subjective Assessment - 02/15/18 0805    Subjective  Pt reports her knee is doing okay today, still some aches    Currently in Pain?  Yes    Pain Score  3     Pain Location  Knee    Pain Orientation  Left    Pain Descriptors / Indicators  Aching;Sore    Pain Type  Chronic pain                       [facility] Adult PT Treatment/Exercise - 02/15/18 0001      Exercises   Exercises  Knee/Hip      Knee/Hip Exercises: Standing   Hip Flexion  2 sets;10 reps;Both    Hip Flexion Limitations  rest  breaks needed    Hip Abduction  Both;2 sets;10 reps   rest breaks needed   Lateral Step Up  Right;Left;1 set;10 reps;Hand Hold: 0;Step Height: 6"    Lateral Step Up Limitations  increased difficulty with L lateral step ups; unable to perform closed chain hip ABD on L to maintain level pelvis.  Unable to activate glute med even with extensive tactile, visual and verbal cues to sequence    Forward Step Up  Both;1 set;10 reps;Hand Hold: 0    Other Standing Knee Exercises  side stepping 10 Ft X 2 each way, with supervision      Knee/Hip Exercises: Seated   Long Arc Quad  2 sets;10 reps;Both    Long Arc Quad Limitations  + ball squeeze    Sit to [employer]  2 sets;10 reps;without UE support      Knee/Hip Exercises: Supine   Bridges  Both;10 reps    Bridges Limitations  with unilat knee ext, difficulty maintaining pelvis stability and height, some improvement with verbal cues    Bridges with [employer]  2 sets;10 reps   red  PT Education - 02/15/18 0809    Education Details  HEP review for updated HEP    Person(s) Educated  Patient    Methods  Explanation;Demonstration    Comprehension  Verbalized understanding;Returned demonstration       PT Short Term Goals - 01/18/18 1309      PT SHORT TERM GOAL #1   Title  STG=LTG'S        PT Long Term Goals - 02/13/18 2140      PT LONG TERM GOAL #1   Title  Pt will demonstrate independence with HEP to progress with LE strengthening and improved flexibility   (LTG date extended to 02/24/18 due to pt missing 2 weeks of therapy)    Time  4    Period  Weeks    Status  New      PT LONG TERM GOAL #2   Title  Pt will improve gait speed to >/= 4.37 ft/sec indicative of normal walking speed and improved functional mobility     Baseline  9/20- 2.64 ft/sec     Time  4    Period  Weeks    Status  New      PT LONG TERM GOAL #3   Title  Pt will ambulate >1000 ft outdoors navigating curbs, ramps, inclines, and grassy surfaces with no AD  and Mod I increasing functional independence with community distances.     Time  4    Period  Weeks    Status  New      PT LONG TERM GOAL #4   Title  Pt will perform stair negotiation consisting of 12 steps using single HR with reciprocal stepping pattern with <2 points on NPRS indicating improvement with functional mobility and pain reduction    Baseline  5/10 pain when ascending stairs using single HR     Time  4    Period  Weeks    Status  New      PT LONG TERM GOAL #5   Title  Pt will increase L knee flexion AROM by 5-8 degrees to demonstrate improvements in flexibility     Baseline  L knee flexion AROM currently 108 degrees     Time  4    Period  Weeks    Status  New      PT LONG TERM GOAL #6   Title  Pt will increase LE strength to overall 4/5 when assessed in sitting to demonstrate improvement in LE strengthening     Baseline  see strength column on evaluation tab     Time  4    Period  Weeks    Status  New            Plan - 02/15/18 0848    Clinical Impression Statement  HEP was printed for patient after addition of lateral and forward step ups to further increase LE strength and endurance. She was albe to progress reps slightly today but requires rest breaks due to fatigue. She shows good effort with PT and will continue to benefit with LE strength and endurance training.     Rehab Potential  Good    PT Frequency  2x / week    PT Duration  4 weeks    PT Treatment/Interventions  ADLs/Self Care Home Management;Gait training;Passive range of motion;Manual techniques;Stair training;Functional mobility training;Therapeutic activities;Patient/family education;Energy conservation;Taping;Therapeutic exercise;Electrical Stimulation;Balance training;Moist Heat;Cryotherapy;Iontophoresis 4mg /ml Dexamethasone    PT Next Visit Plan  continue to progress HEP: forward and lateral step  ups - L glute med training.  more upright/functional strengthening, balance    Consulted and Agree  with Plan of Care  Patient       Patient will benefit from skilled therapeutic intervention in order to improve the following deficits and impairments:  Improper body mechanics, Pain, Decreased mobility, Decreased range of motion, Decreased strength, Hypomobility, Impaired perceived functional ability, Decreased balance, Decreased safety awareness, Difficulty walking, Increased edema, Impaired flexibility  Visit Diagnosis: Chronic pain of left knee  Muscle weakness (generalized)     Problem List Patient Active Problem List   Diagnosis Date Noted  . Unilateral primary osteoarthritis, left knee 01/10/2018  . Chronic pain of left knee 01/10/2018  . DDD (degenerative disc disease), cervical 03/22/2016  . DDD (degenerative disc disease), lumbar 03/22/2016  . 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 Angioedema 07/15/2014  . Hypokalemia 07/15/2014  . Diabetes mellitus, controlled (HCC) 07/03/2012  . HTN (hypertension) 07/03/2012  . Rheumatoid arthritis (HCC) 07/03/2012  . Current chronic use of systemic steroids 07/03/2012  . 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

## 2018-02-15 NOTE — Patient Instructions (Signed)
Access Code: [email or web address]  URL: [email or web address]  Date: 02/15/2018  Prepared by: Ivery Quale   Exercises  Seated Long Arc Quad with Hip Adduction - 10 reps - 2 sets - 1x daily - 7x weekly  Sit to Stand with Arms Crossed - 10 reps - 2 sets - 1x daily - 7x weekly  Standing Hip Abduction - 10 reps - 2 sets - 1x daily - 7x weekly  Standing Hip Flexion March - 10 reps - 2 sets - 1x daily - 7x weekly  Alternating Single Leg Bridge - 10 reps - 2 sets - 1x daily - 7x weekly  Bridge with Hip Abduction and Resistance - 10 reps - 2 sets - 1x daily - 7x weekly  Forward Step Up - 10 reps - 2 sets - 2x daily - 6x weekly  Lateral Step Up with Unilateral Counter Support - 10 reps - 2 sets - 2x daily - 6x weekly   HEP printed for pt today from last session with addition of forward and lateral step ups.

## 2018-02-18 ENCOUNTER — Ambulatory Visit: Payer: Medicare Other | Admitting: Physical Therapy

## 2018-02-18 ENCOUNTER — Encounter: Payer: Self-pay | Admitting: Physical Therapy

## 2018-02-18 DIAGNOSIS — M25562 Pain in left knee: Secondary | ICD-10-CM | POA: Diagnosis not present

## 2018-02-18 DIAGNOSIS — M6281 Muscle weakness (generalized): Secondary | ICD-10-CM

## 2018-02-18 DIAGNOSIS — G8929 Other chronic pain: Secondary | ICD-10-CM

## 2018-02-18 NOTE — Patient Instructions (Addendum)
Exercises   Sidelying Reverse Clamshell - 12 reps - 3 sets - 1x daily - 7x weekly  Clamshell - 10 reps - 3 sets - 1x daily - 7x weekly  Hip Flexor Stretch at Edge of Bed - 3 sets - 60 hold - 1x daily - 7x weekly  Seated Hamstring Stretch - 3 sets - 60 hold - 1x daily - 7x weekly  Standing Gastroc Stretch at Counter - 3 sets - 60 hold - 1x daily - 7x weekly

## 2018-02-18 NOTE — Therapy (Signed)
[facility] [address] [place], [name], [address] Phone: [phone]   Fax:  [phone]  Physical Therapy Treatment  Patient Details  Name: Wendy Woods MRN: [id number] Date of Birth: [birth year] Referring Provider (PT): Norlene Campbell, MD   Encounter Date: 02/18/2018  PT End of Session - 02/18/18 1046    Visit Number  6    Number of Visits  8    Date for PT Re-Evaluation  02/24/18    Authorization Type  [employer] Medicare- Requires 10th visit PN    PT Start Time  0800    PT Stop Time  0845    PT Time Calculation (min)  45 min    Activity Tolerance  Patient tolerated treatment well    Behavior During Therapy  [facility] for tasks assessed/performed       Past Medical History:  Diagnosis Date  . DDD (degenerative disc disease), cervical 03/22/2016  . DDD (degenerative disc disease), lumbar 03/22/2016  . GERD (gastroesophageal reflux disease)   . Hyperlipidemia   . Hypertension   . Rheumatoid arthritis (HCC)   . Rheumatoid arthritis(714.0)   . 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 Type II diabetes mellitus (HCC)     Past Surgical History:  Procedure Laterality Date  . APPENDECTOMY  1968  . CATARACT EXTRACTION W/ INTRAOCULAR LENS  IMPLANT, BILATERAL Bilateral 11/2013-12/2013  . KNEE ARTHROPLASTY    . KNEE ARTHROSCOPY Left 1980's-1990's X 3  . MYOMECTOMY  1990's    There were no vitals filed for this visit.  Subjective Assessment - 02/18/18 0801    Subjective  Pt reports everything is going well and feels the exercises are helping. Reports worsening L knee stiffness in the AM with onset of pain after moving around after waking up. Reports she feels her best in the middle of the day after moving around.     Pertinent History  RA, osteoarthritis, degenerative disc disease of the spine, angioedema, DM, chronic use of systemic steroids, HTN, Scleritis, Hypokalemia     Limitations  Sitting;Walking;Lifting;Standing    How  long can you sit comfortably?  2 hours before having to get up and move around     How long can you walk comfortably?  Pt reports ability to walk community distances with slight pain in L knee    Patient Stated Goals  Increase L LE strength; walk as much to delay L knee surgery.     Currently in Pain?  No/denies         [facility] PT Assessment - 02/18/18 1044      ROM / Strength   AROM / PROM / Strength  AROM      AROM   AROM Assessment Site  Knee    Right/Left Knee  Left    Left Knee Flexion  125        Skilled Physical Therapy Intervention:  Patient verbalizes and demonstrates understanding of the below updated HEP exercises with proper technique to improve functional mobility.    Exercises   Sidelying Reverse Clamshell - 12 reps - 3 sets - 1x daily - 7x weekly  Clamshell - 10 reps - 3 sets - 1x daily - 7x weekly  Hip Flexor Stretch at Edge of Bed - 3 sets - 60 hold - 1x daily - 7x weekly  Seated Hamstring Stretch - 3 sets - 60 hold - 1x daily - 7x weekly  Standing Gastroc Stretch at Counter - 3 sets - 60 hold - 1x daily -  7x weekly     [facility] Adult PT Treatment/Exercise - 02/18/18 1042      Exercises   Exercises  Knee/Hip      Knee/Hip Exercises: Aerobic   Nustep  Pt performs NuStep with red resistance band around distal thigh to increase challenge during aerobic exercise. Patient performs 4 minutes on resistance level 2 progressing to 2 minutes on resistance level 4 using LE's only. Pt verbalizes activity to be moderately challenging.              PT Education - 02/18/18 1044    Education Details  Therapist provided education regarding significant improvement with L knee flexion indicated by increased range upon assessment and updated HEP exercises with patient verbalizing and demonstrating correct technique.     Person(s) Educated  Patient    Methods  Explanation;Demonstration    Comprehension  Verbalized understanding;Returned demonstration       PT Short  Term Goals - 01/18/18 1309      PT SHORT TERM GOAL #1   Title  STG=LTG'S        PT Long Term Goals - 02/13/18 2140      PT LONG TERM GOAL #1   Title  Pt will demonstrate independence with HEP to progress with LE strengthening and improved flexibility   (LTG date extended to 02/24/18 due to pt missing 2 weeks of therapy)    Time  4    Period  Weeks    Status  New      PT LONG TERM GOAL #2   Title  Pt will improve gait speed to >/= 4.37 ft/sec indicative of normal walking speed and improved functional mobility     Baseline  9/20- 2.64 ft/sec     Time  4    Period  Weeks    Status  New      PT LONG TERM GOAL #3   Title  Pt will ambulate >1000 ft outdoors navigating curbs, ramps, inclines, and grassy surfaces with no AD and Mod I increasing functional independence with community distances.     Time  4    Period  Weeks    Status  New      PT LONG TERM GOAL #4   Title  Pt will perform stair negotiation consisting of 12 steps using single HR with reciprocal stepping pattern with <2 points on NPRS indicating improvement with functional mobility and pain reduction    Baseline  5/10 pain when ascending stairs using single HR     Time  4    Period  Weeks    Status  New      PT LONG TERM GOAL #5   Title  Pt will increase L knee flexion AROM by 5-8 degrees to demonstrate improvements in flexibility     Baseline  L knee flexion AROM currently 108 degrees     Time  4    Period  Weeks    Status  New      PT LONG TERM GOAL #6   Title  Pt will increase LE strength to overall 4/5 when assessed in sitting to demonstrate improvement in LE strengthening     Baseline  see strength column on evaluation tab     Time  4    Period  Weeks    Status  New            Plan - 02/18/18 5809    Clinical Impression Statement  Today's skilled session focused on reassessing  L knee flexion ROM and implementing updated HEP strengthening and stretching exercises with patient verbalizing and  demonstrating understanding with proper technique. Patient's L knee flexion ROM has significant improved indicated by a 17 degree difference when compared to her initial evaluation. Pt continues to demonstrate L glut med weakness and will continue to benefit from skilled PT to address LE weakness to improve functional mobility.     Rehab Potential  Good    PT Frequency  2x / week    PT Duration  4 weeks    PT Treatment/Interventions  ADLs/Self Care Home Management;Gait training;Passive range of motion;Manual techniques;Stair training;Functional mobility training;Therapeutic activities;Patient/family education;Energy conservation;Taping;Therapeutic exercise;Electrical Stimulation;Balance training;Moist Heat;Cryotherapy;Iontophoresis 4mg /ml Dexamethasone    PT Next Visit Plan  continue to progress HEP: forward and lateral step ups - L glute med training.  squats on bosu ball, lounges, eccentric quads, blue foam in // bars, 3 way leg strengthening with band resistance, elliptical (patient would like conditioning), assess how stretches are going    PT Home Exercise Plan  [email or web address], [email or web address]    Consulted and Agree with Plan of Care  Patient       Patient will benefit from skilled therapeutic intervention in order to improve the following deficits and impairments:  Improper body mechanics, Pain, Decreased mobility, Decreased range of motion, Decreased strength, Hypomobility, Impaired perceived functional ability, Decreased balance, Decreased safety awareness, Difficulty walking, Increased edema, Impaired flexibility  Visit Diagnosis: Muscle weakness (generalized)  Chronic pain of left knee     Problem List Patient Active Problem List   Diagnosis Date Noted  . Unilateral primary osteoarthritis, left knee 01/10/2018  . Chronic pain of left knee 01/10/2018  . DDD (degenerative disc disease), cervical 03/22/2016  . DDD (degenerative disc disease), lumbar 03/22/2016  . 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  Angioedema 07/15/2014  . Hypokalemia 07/15/2014  . Diabetes mellitus, controlled (HCC) 07/03/2012  . HTN (hypertension) 07/03/2012  . Rheumatoid arthritis (HCC) 07/03/2012  . Current chronic use of systemic steroids 07/03/2012  . 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 [place], [name], [address] Phone: [phone]   Fax:  [phone]  Name: Wendy Woods MRN: [id number] Date of Birth: 1961/02/28

## 2018-02-22 ENCOUNTER — Encounter: Payer: Self-pay | Admitting: Physical Therapy

## 2018-02-22 ENCOUNTER — Ambulatory Visit: Payer: Medicare Other | Admitting: Physical Therapy

## 2018-02-22 DIAGNOSIS — G8929 Other chronic pain: Secondary | ICD-10-CM

## 2018-02-22 DIAGNOSIS — M25562 Pain in left knee: Secondary | ICD-10-CM | POA: Diagnosis not present

## 2018-02-22 DIAGNOSIS — M6281 Muscle weakness (generalized): Secondary | ICD-10-CM

## 2018-02-22 DIAGNOSIS — R29898 Other symptoms and signs involving the musculoskeletal system: Secondary | ICD-10-CM

## 2018-02-22 NOTE — Therapy (Signed)
[facility] [address] [place], [place], [address] Phone: [phone]   Fax:  [phone]  Physical Therapy Treatment  Patient Details  Name: Wendy Woods MRN: [id number] Date of Birth: [birth year] Referring Provider (PT): Joni Fears, MD   Encounter Date: 02/22/2018  PT End of Session - 02/22/18 0918    Visit Number  7    Number of Visits  8    Date for PT Re-Evaluation  02/24/18    Authorization Type  [employer] Medicare- Requires 10th visit PN    PT Start Time  0805    PT Stop Time  0853    PT Time Calculation (min)  48 min    Activity Tolerance  Patient tolerated treatment well    Behavior During Therapy  [facility] for tasks assessed/performed       Past Medical History:  Diagnosis Date  . DDD (degenerative disc disease), cervical 03/22/2016  . DDD (degenerative disc disease), lumbar 03/22/2016  . GERD (gastroesophageal reflux disease)   . Hyperlipidemia   . Hypertension   . Rheumatoid arthritis ([place])   . Rheumatoid arthritis(714.0)   . 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 Type II diabetes mellitus ([place])     Past Surgical History:  Procedure Laterality Date  . APPENDECTOMY  1968  . CATARACT EXTRACTION W/ INTRAOCULAR LENS  IMPLANT, BILATERAL Bilateral 11/2013-12/2013  . KNEE ARTHROPLASTY    . KNEE ARTHROSCOPY Left 1980's-1990's X 3  . MYOMECTOMY  1990's    There were no vitals filed for this visit.  Subjective Assessment - 02/22/18 0810    Subjective  Knees are a little sore this morning but doing well overall.  Asking about continuing visits.  Discussed community wellness options after D/C from PT.    Pertinent History  RA, osteoarthritis, degenerative disc disease of the spine, angioedema, DM, chronic use of systemic steroids, HTN, Scleritis, Hypokalemia     Limitations  Sitting;Walking;Lifting;Standing    How long can you sit comfortably?  2 hours before having to get up and move around     How long can  you walk comfortably?  Pt reports ability to walk community distances with slight pain in L knee    Patient Stated Goals  Increase L LE strength; walk as much to delay L knee surgery.     Currently in Pain?  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       Strength   Overall Strength  Deficits    Right Hip Flexion  4-/5    Right Hip ABduction  4/5    Right Hip ADduction  4/5    Left Hip Flexion  4-/5    Left Hip ABduction  4/5    Left Hip ADduction  4/5    Right Knee Flexion  4-/5    Right Knee Extension  4/5    Left Knee Flexion  4-/5    Left Knee Extension  4/5    Right Ankle Dorsiflexion  4/5    Right Ankle Plantar Flexion  4/5    Left Ankle Dorsiflexion  4/5    Left Ankle Plantar Flexion  4/5      Ambulation/Gait   Ambulation/Gait  Yes    Ambulation/Gait Assistance  6: Modified independent (Device/Increase time)    Ambulation Distance (Feet)  1000 Feet    Assistive device  None    Gait Pattern  Step-through pattern    Ambulation Surface  Outdoor;Paved;Grass;Unlevel    Stairs  Yes    Stairs Assistance  6: Modified independent (Device/Increase time)    Stair Management Technique  No rails;Alternating pattern;Forwards    Number of Stairs  12    Height of Stairs  6    Curb  6: Modified independent (Device/increase time)    Gait Comments  reports L knee  pain with ascending stairs on lateral side of L knee joint.  Outside also performed changes in gait speed and stepping over larger obstacles - parking bumpers MOD I      Standardized Balance Assessment   Standardized Balance Assessment  10 meter walk test    10 Meter Walk  9.84 seconds or 3.33 ft/sec                           PT Education - 02/22/18 0912    Education Details  progress towards goals, recertification x 4 more weeks and focus of treatment, importance of having HEP and community wellness program at D/C to maintain gains made in therapy, aquatic exercises ([place])    Person(s) Educated  Patient    Methods  Explanation    Comprehension  Verbalized understanding       PT Short Term Goals - 01/18/18 1309      PT SHORT TERM GOAL #1   Title  STG=LTG'S        PT Long Term Goals - 02/22/18 0817      PT LONG TERM GOAL #1   Title  Pt will demonstrate independence with HEP to progress with LE strengthening and improved flexibility   (LTG date extended to 02/24/18 due to pt missing 2 weeks of therapy)    Time  4    Period  Weeks    Status  Achieved      PT LONG TERM GOAL #2   Title  Pt will improve gait speed to >/= 4.37 ft/sec indicative of normal walking speed and improved functional mobility     Baseline  3.33 ft/sec no AD    Time  4    Period  Weeks    Status  Partially Met      PT LONG TERM GOAL #3   Title  Pt will ambulate >1000 ft outdoors navigating curbs, ramps, inclines, and grassy surfaces with no AD and Mod I increasing functional independence with community distances.     Time  4    Period  Weeks    Status  Achieved      PT LONG TERM GOAL #4   Title  Pt will perform stair negotiation consisting of  12 steps using single HR with reciprocal stepping pattern with <2 points on NPRS indicating improvement with functional mobility and pain reduction    Baseline  5/10 in LLE ascending stairs without rail    Time  4     Period  Weeks    Status  Partially Met      PT LONG TERM GOAL #5   Title  Pt will increase L knee flexion AROM by 5-8 degrees to demonstrate improvements in flexibility     Baseline  125 deg L knee flexion    Time  4    Period  Weeks    Status  Achieved      PT LONG TERM GOAL #6   Title  Pt will increase LE strength to overall 4/5 when assessed in sitting to demonstrate improvement in LE strengthening     Baseline  --    Time  4    Period  Weeks    Status  Achieved        NEW LTG for recertification:   PT Long Term Goals - 02/22/18 1232      PT LONG TERM GOAL #1   Title  Pt will demonstrate independence with land and aquatic HEP as well as initiate community wellness program at gym/activity center    Time  4    Period  Weeks    Status  Revised    Target Date  03/26/18      PT LONG TERM GOAL #2   Title  Pt will improve gait speed to >/= 4.00 ft/sec indicative of normal walking speed and improved functional mobility     Baseline  3.33 ft/sec no AD    Time  4    Period  Weeks    Status  Revised    Target Date  03/26/18      PT LONG TERM GOAL #4   Title  Pt will perform stair negotiation consisting of 12 steps without rail, alternating sequence rating pain at </= 2/10    Baseline  5/10 in LLE ascending stairs without rail    Time  4    Period  Weeks    Status  Revised    Target Date  03/26/18      PT LONG TERM GOAL #6   Title  Pt will increase LE strength to overall 4+/5 when assessed in sitting to demonstrate improvement in LE strengthening     Baseline  4- to 4/5 bilaterally    Time  4    Period  Weeks    Status  Revised    Target Date  03/26/18           Plan - 02/22/18 0919    Clinical Impression Statement  Completed assessment of LTG today; pt is making good progress and has met 4/6 LTG demonstrating increased strength and ROM in L knee, increased gait velocity and slightly decreased overalll pain during functional mobility.  Pt continues to demonstrate  decreased overall functional strength in bilat LE, pain with ascending stairs, abnormal patellar tracking in L knee and gait speed below normal limits for independent community ambulator.  Pt will benefit from ongoing skilled PT services to address these impairments to maximize functional mobility independence and decrease pain    Rehab Potential  Good    Clinical Impairments Affecting Rehab Potential   RA, osteoarthritis, degenerative disc disease of the spine, angioedema, DM, chronic use of systemic steroids, HTN, Scleritis, Hypokalemia     PT Frequency  2x / week    PT Duration  4 weeks    PT Treatment/Interventions  ADLs/Self Care Home Management;Gait training;Passive range of motion;Manual techniques;Stair training;Functional mobility training;Therapeutic activities;Patient/family education;Taping;Therapeutic exercise;Electrical Stimulation;Balance training;Moist Heat;Cryotherapy;Iontophoresis 40m/ml Dexamethasone;Aquatic Therapy;Dry needling    PT Next Visit Plan  [facility]  Will discuss 2 sessions with SVinnie Levelfor aquatic HEP.  patellar mobs and taping to improve L patella tracking.  Bioness L quad.  Revise HEP: focus on hip flexion, hamstring, hip ABD, ankle strengthening, forward and lateral step ups - L glute med training.  squats on bosu ball, lounges, eccentric quads, blue foam in // bars, 3 way leg strengthening with band resistance, elliptical (patient would like conditioning)    PT [place] [id number], aquatic HEP?  [place]    Consulted and Agree with Plan of Care  Patient       Patient will benefit from skilled therapeutic intervention in order to improve the following deficits and impairments:  Improper body mechanics, Pain, Decreased mobility, Decreased range of motion, Decreased strength, Hypomobility, Impaired perceived functional ability, Decreased balance, Decreased safety awareness, Difficulty walking, Increased  edema, Impaired flexibility  Visit Diagnosis: Muscle weakness (generalized)  Chronic pain of left knee  Other symptoms and signs involving the musculoskeletal system     Problem List Patient Active Problem List   Diagnosis Date Noted  . Unilateral primary osteoarthritis, left knee 01/10/2018  . Chronic pain of left knee 01/10/2018  . DDD (degenerative disc disease), cervical 03/22/2016  . DDD (degenerative disc disease), lumbar 03/22/2016  . 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 Angioedema 07/15/2014  . Hypokalemia 07/15/2014  . Diabetes mellitus, controlled ([place] 07/03/2012  . HTN (hypertension) 07/03/2012  . Rheumatoid arthritis ([place] 07/03/2012  . Current chronic use of systemic steroids 07/03/2012  . 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

## 2018-02-26 ENCOUNTER — Ambulatory Visit: Payer: Medicare Other | Admitting: Physical Therapy

## 2018-02-26 DIAGNOSIS — M25562 Pain in left knee: Secondary | ICD-10-CM

## 2018-02-26 DIAGNOSIS — R29898 Other symptoms and signs involving the musculoskeletal system: Secondary | ICD-10-CM

## 2018-02-26 DIAGNOSIS — M6281 Muscle weakness (generalized): Secondary | ICD-10-CM

## 2018-02-26 DIAGNOSIS — G8929 Other chronic pain: Secondary | ICD-10-CM

## 2018-02-26 NOTE — Therapy (Signed)
[facility] [address] [place], [name], [address] Phone: [phone]   Fax:  [phone]  Physical Therapy Treatment  Patient Details  Name: Wendy Woods MRN: [id number] Date of Birth: 1961/03/22 Referring Provider (PT): Norlene Campbell, MD   Encounter Date: 02/26/2018  PT End of Session - 02/26/18 0943    Visit Number  8    Number of Visits  16    Date for PT Re-Evaluation  03/26/18    Authorization Type  [employer] Medicare- Requires 10th visit PN    PT Start Time  0805    PT Stop Time  0845    PT Time Calculation (min)  40 min    Activity Tolerance  Patient tolerated treatment well    Behavior During Therapy  [facility] for tasks assessed/performed       Past Medical History:  Diagnosis Date  . DDD (degenerative disc disease), cervical 03/22/2016  . DDD (degenerative disc disease), lumbar 03/22/2016  . GERD (gastroesophageal reflux disease)   . Hyperlipidemia   . Hypertension   . Rheumatoid arthritis (HCC)   . Rheumatoid arthritis(714.0)   . 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 Type II diabetes mellitus (HCC)     Past Surgical History:  Procedure Laterality Date  . APPENDECTOMY  1968  . CATARACT EXTRACTION W/ INTRAOCULAR LENS  IMPLANT, BILATERAL Bilateral 11/2013-12/2013  . KNEE ARTHROPLASTY    . KNEE ARTHROSCOPY Left 1980's-1990's X 3  . MYOMECTOMY  1990's    There were no vitals filed for this visit.  Subjective Assessment - 02/26/18 0823    Subjective  Pt relays knees are still a little achy, she has not looked into the [facility] yet and was encouraged to do so.     Pain Score  4     Pain Location  Knee    Pain Orientation  Right;Left    Pain Descriptors / Indicators  Aching;Sore                       [facility] Adult PT Treatment/Exercise - 02/26/18 0001      Exercises   Exercises  Knee/Hip      Knee/Hip Exercises: Stretches   Active Hamstring Stretch  Right;Left;2 reps;30 seconds     Gastroc Stretch  Both;2 reps;30 seconds   standing at wall     Knee/Hip Exercises: Aerobic   Nustep  5 min L5      Knee/Hip Exercises: Standing   Lateral Step Up  10 reps;Hand Hold: 1;Step Height: 6";Right;Left    Forward Step Up  Right;Left;15 reps;Hand Hold: 1;Step Height: 6"    Other Standing Knee Exercises  heel toe raises X 20     Other Standing Knee Exercises  standing hip 3 way red X 15 ea bilat      Knee/Hip Exercises: Seated   Long Arc Quad  20 reps    Long Arc Quad Limitations  + ball squeeze    Sit to [employer]  without UE support;15 reps   focus on slow eccentric control     Knee/Hip Exercises: Supine   Bridges  2 sets;10 reps   hold 5 sec     Knee/Hip Exercises: Sidelying   Clams  20               PT Short Term Goals - 02/22/18 1232      PT SHORT TERM GOAL #1   Title  STG=LTG'S        PT  Long Term Goals - 02/22/18 1232      PT LONG TERM GOAL #1   Title  Pt will demonstrate independence with land and aquatic HEP as well as initiate community wellness program at gym/activity center    Time  4    Period  Weeks    Status  Revised    Target Date  03/26/18      PT LONG TERM GOAL #2   Title  Pt will improve gait speed to >/= 4.00 ft/sec indicative of normal walking speed and improved functional mobility     Baseline  3.33 ft/sec no AD    Time  4    Period  Weeks    Status  Revised    Target Date  03/26/18      PT LONG TERM GOAL #4   Title  Pt will perform stair negotiation consisting of 12 steps without rail, alternating sequence rating pain at </= 2/10    Baseline  5/10 in LLE ascending stairs without rail    Time  4    Period  Weeks    Status  Revised    Target Date  03/26/18      PT LONG TERM GOAL #6   Title  Pt will increase LE strength to overall 4+/5 when assessed in sitting to demonstrate improvement in LE strengthening     Baseline  4- to 4/5 bilaterally    Time  4    Period  Weeks    Status  Revised    Target Date  03/26/18             Plan - 02/26/18 0943    Clinical Impression Statement  Session focused on hip/knee strength and endurance today with focus on eccentric contol. She showed good effort with PT and had good tolerance to session but was fatigued at end. She was again reminded about looking into senior center to maintain exercises after PT DC.    Rehab Potential  Good    Clinical Impairments Affecting Rehab Potential   RA, osteoarthritis, degenerative disc disease of the spine, angioedema, DM, chronic use of systemic steroids, HTN, Scleritis, Hypokalemia     PT Frequency  2x / week    PT Duration  4 weeks    PT Treatment/Interventions  ADLs/Self Care Home Management;Gait training;Passive range of motion;Manual techniques;Stair training;Functional mobility training;Therapeutic activities;Patient/family education;Taping;Therapeutic exercise;Electrical Stimulation;Balance training;Moist Heat;Cryotherapy;Iontophoresis 4mg /ml Dexamethasone;Aquatic Therapy;Dry needling    PT Next Visit Plan  Did she look into the [facility]?  Will discuss 2 sessions with [facility] for aquatic HEP.  patellar mobs and taping to improve L patella tracking.  Bioness L quad.  Revise HEP: focus on hip flexion, hamstring, hip ABD, ankle strengthening, forward and lateral step ups - L glute med training.  squats on bosu ball, lounges, eccentric quads, blue foam in // bars, 3 way leg strengthening with band resistance, elliptical (patient would like conditioning)    PT Home Exercise Plan  [id number], [id number], aquatic HEP?  [facility]?    Consulted and Agree with Plan of Care  Patient       Patient will benefit from skilled therapeutic intervention in order to improve the following deficits and impairments:     Visit Diagnosis: Muscle weakness (generalized)  Chronic pain of left knee  Other symptoms and signs involving the musculoskeletal system     Problem List Patient Active Problem List   Diagnosis Date  Noted  . Unilateral primary osteoarthritis, left knee  01/10/2018  . Chronic pain of left knee 01/10/2018  . DDD (degenerative disc disease), cervical 03/22/2016  . DDD (degenerative disc disease), lumbar 03/22/2016  . 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 Angioedema 07/15/2014  . Hypokalemia 07/15/2014  . Diabetes mellitus, controlled (HCC) 07/03/2012  . HTN (hypertension) 07/03/2012  . Rheumatoid arthritis (HCC) 07/03/2012  . Current chronic use of systemic steroids 07/03/2012  . 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

## 2018-02-28 ENCOUNTER — Ambulatory Visit: Payer: Medicare Other | Admitting: Physical Therapy

## 2018-03-01 ENCOUNTER — Encounter: Payer: Self-pay | Admitting: Physical Therapy

## 2018-03-01 ENCOUNTER — Ambulatory Visit: Payer: Medicare Other | Attending: Orthopaedic Surgery | Admitting: Physical Therapy

## 2018-03-01 DIAGNOSIS — M25562 Pain in left knee: Secondary | ICD-10-CM | POA: Insufficient documentation

## 2018-03-01 DIAGNOSIS — R29898 Other symptoms and signs involving the musculoskeletal system: Secondary | ICD-10-CM | POA: Insufficient documentation

## 2018-03-01 DIAGNOSIS — G8929 Other chronic pain: Secondary | ICD-10-CM | POA: Insufficient documentation

## 2018-03-01 DIAGNOSIS — M6281 Muscle weakness (generalized): Secondary | ICD-10-CM | POA: Diagnosis present

## 2018-03-01 NOTE — Therapy (Signed)
[facility] [address] [place], [name], [address] Phone: [phone]   Fax:  [phone]  Physical Therapy Treatment  Patient Details  Name: Wendy Woods MRN: [id number] Date of Birth: [birth year] Referring Provider (PT): Norlene Campbell, MD   Encounter Date: 03/01/2018  PT End of Session - 03/01/18 0904    Visit Number  9    Number of Visits  16    Date for PT Re-Evaluation  03/26/18    Authorization Type  [employer] Medicare- Requires 10th visit PN    PT Start Time  0805    PT Stop Time  0845    PT Time Calculation (min)  40 min    Activity Tolerance  Patient tolerated treatment well    Behavior During Therapy  [facility] for tasks assessed/performed       Past Medical History:  Diagnosis Date  . DDD (degenerative disc disease), cervical 03/22/2016  . DDD (degenerative disc disease), lumbar 03/22/2016  . GERD (gastroesophageal reflux disease)   . Hyperlipidemia   . Hypertension   . Rheumatoid arthritis (HCC)   . Rheumatoid arthritis(714.0)   . 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 Type II diabetes mellitus (HCC)     Past Surgical History:  Procedure Laterality Date  . APPENDECTOMY  1968  . CATARACT EXTRACTION W/ INTRAOCULAR LENS  IMPLANT, BILATERAL Bilateral 11/2013-12/2013  . KNEE ARTHROPLASTY    . KNEE ARTHROSCOPY Left 1980's-1990's X 3  . MYOMECTOMY  1990's    There were no vitals filed for this visit.  Subjective Assessment - 03/01/18 0805    Subjective  No issues to report; no pain even with changes in weather    Pertinent History  RA, osteoarthritis, degenerative disc disease of the spine, angioedema, DM, chronic use of systemic steroids, HTN, Scleritis, Hypokalemia     How long can you sit comfortably?  2 hours before having to get up and move around     How long can you walk comfortably?  Pt reports ability to walk community distances with slight pain in L knee    Patient Stated Goals  Increase L LE  strength; walk as much to delay L knee surgery.     Currently in Pain?  No/denies                       [facility] Adult PT Treatment/Exercise - 03/01/18 0818      Ambulation/Gait   Ambulation/Gait  Yes    Ambulation/Gait Assistance  7: Independent    Ambulation Distance (Feet)  115 Feet    Assistive device  None    Gait Pattern  Step-through pattern    Ambulation Surface  Indoor;Level    Gait Comments  with Bioness in gait mode to improved quad activation during stance phase      Exercises   Exercises  Knee/Hip      Knee/Hip Exercises: Standing   Wall Squat  2 sets;10 reps    Wall Squat Limitations  Bioness in training mode: first set with pt pressing out into pillow against wall to L for isometric vastus lateralis.  Attempted squat with block squeeze and towel squeeze for VMO activation but pt demonstrated increased genu valgus and pressure on medial knee so terminated exercise.  Cues to initiate return to stand with LLE for more symmetrical activation      Knee/Hip Exercises: Supine   Quad Sets  Strengthening;Left;1 set;10 reps    Quad Sets Limitations  with Bioness in  training mode       Modalities   Modalities  Geologist, engineering Location  L quad (lower cuff on for sensor but no stimulation delivered)    Electrical Stimulation Action  closed and open chain knee extension    Electrical Stimulation Parameters  See Tablet 1    Electrical Stimulation Goals  Strength;Pain;Other (comment)   Patella tracking     Manual Therapy   Manual Therapy  Joint mobilization    Joint Mobilization  Patellar mobs lateral<>medial, superior <> inferior             PT Education - 03/01/18 0903    Education Details  use of functional electrical stimulation for quad strengthening    Person(s) Educated  Patient    Methods  Explanation    Comprehension  Verbalized understanding       PT Short Term Goals - 02/22/18  1232      PT SHORT TERM GOAL #1   Title  STG=LTG'S        PT Long Term Goals - 02/22/18 1232      PT LONG TERM GOAL #1   Title  Pt will demonstrate independence with land and aquatic HEP as well as initiate community wellness program at gym/activity center    Time  4    Period  Weeks    Status  Revised    Target Date  03/26/18      PT LONG TERM GOAL #2   Title  Pt will improve gait speed to >/= 4.00 ft/sec indicative of normal walking speed and improved functional mobility     Baseline  3.33 ft/sec no AD    Time  4    Period  Weeks    Status  Revised    Target Date  03/26/18      PT LONG TERM GOAL #4   Title  Pt will perform stair negotiation consisting of 12 steps without rail, alternating sequence rating pain at </= 2/10    Baseline  5/10 in LLE ascending stairs without rail    Time  4    Period  Weeks    Status  Revised    Target Date  03/26/18      PT LONG TERM GOAL #6   Title  Pt will increase LE strength to overall 4+/5 when assessed in sitting to demonstrate improvement in LE strengthening     Baseline  4- to 4/5 bilaterally    Time  4    Period  Weeks    Status  Revised    Target Date  03/26/18            Plan - 03/01/18 0908    Clinical Impression Statement  Session focused on use of functional electrical stimulation to L quad + patellar mobilizations to improve quad timing, sequencing of mm activation and strengthening.  Pt tolerated well, felt improvement in activation, decreased pain and began to demonstrate more symmetrical activation between L and R.  Will continue to progress and utilize with strengthening and gait training.    Rehab Potential  Good    Clinical Impairments Affecting Rehab Potential   RA, osteoarthritis, degenerative disc disease of the spine, angioedema, DM, chronic use of systemic steroids, HTN, Scleritis, Hypokalemia     PT Frequency  2x / week    PT Duration  4 weeks    PT Treatment/Interventions  ADLs/Self Care Home  Management;Gait training;Passive range  of motion;Manual techniques;Stair training;Functional mobility training;Therapeutic activities;Patient/family education;Taping;Therapeutic exercise;Electrical Stimulation;Balance training;Moist Heat;Cryotherapy;Iontophoresis 4mg /ml Dexamethasone;Aquatic Therapy;Dry needling    PT Next Visit Plan  taping to improve L patella tracking.  Bioness L quad with strengthening and gait/stair training.  Revise HEP: focus on hip flexion, hamstring, hip ABD, ankle strengthening, forward and lateral step ups - L glute med training.  squats on bosu ball, lounges, eccentric quads, blue foam in // bars, 3 way leg strengthening with band resistance, elliptical (patient would like conditioning)    PT Home Exercise Plan  [id number], [id number], aquatic HEP?  [facility]?    Consulted and Agree with Plan of Care  Patient       Patient will benefit from skilled therapeutic intervention in order to improve the following deficits and impairments:     Visit Diagnosis: Muscle weakness (generalized)  Chronic pain of left knee  Other symptoms and signs involving the musculoskeletal system     Problem List Patient Active Problem List   Diagnosis Date Noted  . Unilateral primary osteoarthritis, left knee 01/10/2018  . Chronic pain of left knee 01/10/2018  . DDD (degenerative disc disease), cervical 03/22/2016  . DDD (degenerative disc disease), lumbar 03/22/2016  . 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 Angioedema 07/15/2014  . Hypokalemia 07/15/2014  . Diabetes mellitus, controlled (HCC) 07/03/2012  . HTN (hypertension) 07/03/2012  . Rheumatoid arthritis (HCC) 07/03/2012  . Current chronic use of systemic steroids 07/03/2012  . 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 [place], [place], [name] Phone: [phone]   Fax:   [phone]  Name: Camile Esters MRN: Estrella Deeds Date of Birth: Jan 06, 1961

## 2018-03-04 ENCOUNTER — Encounter: Payer: Self-pay | Admitting: Physical Therapy

## 2018-03-04 ENCOUNTER — Ambulatory Visit: Payer: Medicare Other | Admitting: Physical Therapy

## 2018-03-04 DIAGNOSIS — G8929 Other chronic pain: Secondary | ICD-10-CM

## 2018-03-04 DIAGNOSIS — R29898 Other symptoms and signs involving the musculoskeletal system: Secondary | ICD-10-CM

## 2018-03-04 DIAGNOSIS — M6281 Muscle weakness (generalized): Secondary | ICD-10-CM

## 2018-03-04 DIAGNOSIS — M25562 Pain in left knee: Secondary | ICD-10-CM

## 2018-03-04 NOTE — Therapy (Signed)
[facility] [address] [place], [name], [address] Phone: [phone]   Fax:  [phone]  Physical Therapy Treatment & Progress Note  Patient Details  Name: Wendy Woods MRN: [id number] Date of Birth: 08-Dec-1960 Referring Provider (PT): Norlene Campbell, MD   Encounter Date: 03/04/2018  PT End of Session - 03/04/18 1110    Visit Number  10    Number of Visits  16    Date for PT Re-Evaluation  03/26/18    Authorization Type  [employer] Medicare- Requires 10th visit PN    PT Start Time  0800    PT Stop Time  0845    PT Time Calculation (min)  45 min    Activity Tolerance  Patient tolerated treatment well    Behavior During Therapy  [facility] for tasks assessed/performed       Past Medical History:  Diagnosis Date  . DDD (degenerative disc disease), cervical 03/22/2016  . DDD (degenerative disc disease), lumbar 03/22/2016  . GERD (gastroesophageal reflux disease)   . Hyperlipidemia   . Hypertension   . Rheumatoid arthritis (HCC)   . Rheumatoid arthritis(714.0)   . 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 Type II diabetes mellitus (HCC)     Past Surgical History:  Procedure Laterality Date  . APPENDECTOMY  1968  . CATARACT EXTRACTION W/ INTRAOCULAR LENS  IMPLANT, BILATERAL Bilateral 11/2013-12/2013  . KNEE ARTHROPLASTY    . KNEE ARTHROSCOPY Left 1980's-1990's X 3  . MYOMECTOMY  1990's    There were no vitals filed for this visit.  Subjective Assessment - 03/04/18 0802    Subjective  Reports she is feeling well today and walked a lot over the weekend. Reports she feels stronger after performing her exercises.    Pertinent History  RA, osteoarthritis, degenerative disc disease of the spine, angioedema, DM, chronic use of systemic steroids, HTN, Scleritis, Hypokalemia     Limitations  Sitting;Walking;Lifting;Standing    How long can you sit comfortably?  2 hours before having to get up and move around     How long can you  walk comfortably?  Pt reports ability to walk community distances with slight pain in L knee    Patient Stated Goals  Increase L LE strength; walk as much to delay L knee surgery.     Currently in Pain?  Yes    Pain Score  4     Pain Location  Knee    Pain Orientation  Left    Pain Descriptors / Indicators  Aching;Sore    Pain Type  Chronic pain    Pain Onset  More than a month ago            [facility] Adult PT Treatment/Exercise - 03/04/18 1103      Ambulation/Gait   Ambulation/Gait  Yes    Ambulation/Gait Assistance  5: Supervision    Ambulation/Gait Assistance Details  Pt performs multiple ambulation trials on level surface with bioness upper thigh cuff providing stimulation to her L quad during gait. Pt progresses to performing forward and lateral side stepping over hurdles of various heights to increase challenge with quad and hip flexor activation during functional mobility. Therapist challenges patient to step over with her L LE with cueing to increase hip flexor activation to avoid circumduction compensation.     Ambulation Distance (Feet)  115 Feet   x3 trials   Assistive device  None    Gait Pattern  Step-through pattern    Ambulation Surface  Level;Indoor  Exercises   Exercises  Knee/Hip      Knee/Hip Exercises: Aerobic   Nustep  Pt performs 5 min on level 3 with bilateral LE's to warm up prior to therapy session and promote strengthening      Knee/Hip Exercises: Supine   Bridges  10 reps;1 set   with bioness thigh cuff for quad stim. on training mode   Bridges with [employer]  Strengthening;Both;1 set;10 reps   with bioness thigh cuff for quad stimulation    Straight Leg Raise with External Rotation  Left;2 sets;10 reps    Straight Leg Raise with External Rotation Limitations  Therapist provided cues for proper technique and pelvic stabilization during exercise      Modalities   Modalities  Electrical Stimulation      Electrical Stimulation   Electrical  Stimulation Location  L quad (lower cuff on for sensor but no stimulation delivered)    Electrical Stimulation Action  closed and open chain knee extension    Electrical Stimulation Parameters  see tablet 1    Electrical Stimulation Goals  Strength;Pain;Other (comment)   patellar tracking            PT Education - 03/04/18 1109    Education Details  Therapist provided education regarding continued utilization of electrical stimulation to promote increased quad activation during strengthening exercises and functional mobility.    Person(s) Educated  Patient    Methods  Explanation    Comprehension  Verbalized understanding       PT Short Term Goals - 02/22/18 1232      PT SHORT TERM GOAL #1   Title  STG=LTG'S        PT Long Term Goals - 02/22/18 1232      PT LONG TERM GOAL #1   Title  Pt will demonstrate independence with land and aquatic HEP as well as initiate community wellness program at gym/activity center    Time  4    Period  Weeks    Status  Revised    Target Date  03/26/18      PT LONG TERM GOAL #2   Title  Pt will improve gait speed to >/= 4.00 ft/sec indicative of normal walking speed and improved functional mobility     Baseline  3.33 ft/sec no AD    Time  4    Period  Weeks    Status  Revised    Target Date  03/26/18      PT LONG TERM GOAL #4   Title  Pt will perform stair negotiation consisting of 12 steps without rail, alternating sequence rating pain at </= 2/10    Baseline  5/10 in LLE ascending stairs without rail    Time  4    Period  Weeks    Status  Revised    Target Date  03/26/18      PT LONG TERM GOAL #6   Title  Pt will increase LE strength to overall 4+/5 when assessed in sitting to demonstrate improvement in LE strengthening     Baseline  4- to 4/5 bilaterally    Time  4    Period  Weeks    Status  Revised    Target Date  03/26/18            Plan - 03/04/18 1110    Clinical Impression Statement  Skilled session focused  on continued use of upper thigh cuff bioness for L quad stimulation during strengthening exercises and functional  mobility. Pt tolerated session well indicating noticeable improvement in quad activation during gait cycle with decreased muscular fatigue during gait trials. Pt reports she would like to continue utilizing bioness device to promote LE strengthening and improvements with functional mobility. Pt will continue to benefit from skilled physical therapy to address strength, balance, and functional mobility deficits.     Rehab Potential  Good    Clinical Impairments Affecting Rehab Potential   RA, osteoarthritis, degenerative disc disease of the spine, angioedema, DM, chronic use of systemic steroids, HTN, Scleritis, Hypokalemia     PT Frequency  2x / week    PT Duration  4 weeks    PT Treatment/Interventions  ADLs/Self Care Home Management;Gait training;Passive range of motion;Manual techniques;Stair training;Functional mobility training;Therapeutic activities;Patient/family education;Taping;Therapeutic exercise;Electrical Stimulation;Balance training;Moist Heat;Cryotherapy;Iontophoresis 4mg /ml Dexamethasone;Aquatic Therapy;Dry needling    PT Next Visit Plan  taping to improve L patella tracking.  Bioness L quad with strengthening and gait/stair training. forward/lateral step ups, eccentric quads, side stepping. Revise HEP: focus on hip flexion, hamstring, hip ABD, ankle strengthening, forward and lateral step ups - L glute med training.  squats on bosu ball, lounges, eccentric quads, blue foam in // bars, 3 way leg strengthening with band resistance, elliptical (patient would like conditioning)    PT Home Exercise Plan  [id number], [id number], aquatic HEP?  [facility] Activity Center?    Consulted and Agree with Plan of Care  Patient       Patient will benefit from skilled therapeutic intervention in order to improve the following deficits and impairments:  Improper body mechanics, Pain, Decreased  mobility, Decreased range of motion, Decreased strength, Hypomobility, Impaired perceived functional ability, Decreased balance, Decreased safety awareness, Difficulty walking, Increased edema, Impaired flexibility  Visit Diagnosis: Muscle weakness (generalized)  Other symptoms and signs involving the musculoskeletal system  Chronic pain of left knee     Problem List Patient Active Problem List   Diagnosis Date Noted  . Unilateral primary osteoarthritis, left knee 01/10/2018  . Chronic pain of left knee 01/10/2018  . DDD (degenerative disc disease), cervical 03/22/2016  . DDD (degenerative disc disease), lumbar 03/22/2016  . 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 Angioedema 07/15/2014  . Hypokalemia 07/15/2014  . Diabetes mellitus, controlled (HCC) 07/03/2012  . HTN (hypertension) 07/03/2012  . Rheumatoid arthritis (HCC) 07/03/2012  . Current chronic use of systemic steroids 07/03/2012  . 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 Therapist will continue implementing the use of Bioness to provide functional electrical stimulation to increase L quad activation during strengthening and functional mobility tasks.   Reason Skilled Services are Required: To maximize patient's functional independence and reduce fall risk potential.   13/4/19, SPT 03/04/2018, 11:14 AM  [facility] [address] [place], [place], [name] Phone: [phone]   Fax:  [phone]  Name: Wendy Woods MRN: Estrella Deeds Date of Birth: Feb 14, 1961

## 2018-03-05 ENCOUNTER — Encounter: Payer: Self-pay | Admitting: Physical Therapy

## 2018-03-05 ENCOUNTER — Ambulatory Visit: Payer: Medicare Other | Admitting: Physical Therapy

## 2018-03-05 DIAGNOSIS — M6281 Muscle weakness (generalized): Secondary | ICD-10-CM | POA: Diagnosis not present

## 2018-03-05 NOTE — Therapy (Signed)
[facility] [address] [place], [name], [address] Phone: [phone]   Fax:  [phone]  Physical Therapy Treatment  Patient Details  Name: Wendy Woods MRN: [id number] Date of Birth: 03-23-1961 Referring Provider (PT): Norlene Campbell, MD   Encounter Date: 03/05/2018  PT End of Session - 03/05/18 0855    Visit Number  11    Number of Visits  16    Date for PT Re-Evaluation  03/26/18    Authorization Type  [employer] Medicare- Requires 10th visit PN    PT Start Time  0800    PT Stop Time  0845    PT Time Calculation (min)  45 min    Activity Tolerance  Patient tolerated treatment well    Behavior During Therapy  [facility] for tasks assessed/performed       Past Medical History:  Diagnosis Date  . DDD (degenerative disc disease), cervical 03/22/2016  . DDD (degenerative disc disease), lumbar 03/22/2016  . GERD (gastroesophageal reflux disease)   . Hyperlipidemia   . Hypertension   . Rheumatoid arthritis (HCC)   . Rheumatoid arthritis(714.0)   . 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 Type II diabetes mellitus (HCC)     Past Surgical History:  Procedure Laterality Date  . APPENDECTOMY  1968  . CATARACT EXTRACTION W/ INTRAOCULAR LENS  IMPLANT, BILATERAL Bilateral 11/2013-12/2013  . KNEE ARTHROPLASTY    . KNEE ARTHROSCOPY Left 1980's-1990's X 3  . MYOMECTOMY  1990's    There were no vitals filed for this visit.  Subjective Assessment - 03/05/18 0804    Subjective  Reports everything is going well and she feels good s/p yesterday's treatment session.     Pertinent History  RA, osteoarthritis, degenerative disc disease of the spine, angioedema, DM, chronic use of systemic steroids, HTN, Scleritis, Hypokalemia     Limitations  Sitting;Walking;Lifting;Standing    How long can you sit comfortably?  2 hours before having to get up and move around     How long can you walk comfortably?  Pt reports ability to walk community  distances with slight pain in L knee    Patient Stated Goals  Increase L LE strength; walk as much to delay L knee surgery.     Currently in Pain?  Yes    Pain Score  4     Pain Location  Knee    Pain Orientation  Left    Pain Descriptors / Indicators  Aching    Pain Type  Chronic pain    Pain Onset  More than a month ago    Pain Frequency  Constant    Pain Relieving Factors  reports she feels better with movement            [facility] Adult PT Treatment/Exercise - 03/05/18 0848      Balance   Balance Assessed  Yes      Dynamic Standing Balance   Foam balance beam comments:  Pt performs forward and backwards tandem walking on foam beam for x2 trials with intermittant UE assist for steadying in // bars. Pt then performs x4 trials of tandem walking with quad eccentric lower with bioness thigh cuff providing quad stimulation during exercise.       Exercises   Exercises  Knee/Hip      Knee/Hip Exercises: Aerobic   Nustep  Pt performs 5 min on level 4 with bilateral LE's to warm up prior to therapy session and promote strengthening  Knee/Hip Exercises: Standing   Lateral Step Up  Both;1 set;10 reps    Forward Step Up  1 set;Left   6 inch progressing to 8 inch step height   Step Down  Right;2 sets;10 reps   8 inch step; working on L LE eccentric control c R step down   Other Standing Knee Exercises  Pt performs 2x10 L LE eccentric quads from 4 inch step with intermittant UE assist in // bars. Therapist provided cueing for proper technique with increased glut med activation providing pelvic stabilization    Other Standing Knee Exercises  Pt performs x5 reps of mini squats on bosu ball with bioness upper thigh cuff providing increased quad activation to LLE during dynamic balance and strengthening exercise      Modalities   Modalities  Electrical Stimulation      Electrical Stimulation   Electrical Stimulation Location  L quad (lower cuff on for sensor but no stimulation delivered)     Electrical Stimulation Action  closed and open chain knee extension    Electrical Stimulation Parameters  see tablet 1    Electrical Stimulation Goals  Strength;Pain;Other (comment)   patellar tracking            PT Education - 03/05/18 0855    Education Details  Therapist provided education regarding POC moving forward with additional focus on dynamic standing balance and functional mobility during upcoming treatment sessions.     Person(s) Educated  Patient    Methods  Explanation    Comprehension  Verbalized understanding       PT Short Term Goals - 02/22/18 1232      PT SHORT TERM GOAL #1   Title  STG=LTG'S        PT Long Term Goals - 02/22/18 1232      PT LONG TERM GOAL #1   Title  Pt will demonstrate independence with land and aquatic HEP as well as initiate community wellness program at gym/activity center    Time  4    Period  Weeks    Status  Revised    Target Date  03/26/18      PT LONG TERM GOAL #2   Title  Pt will improve gait speed to >/= 4.00 ft/sec indicative of normal walking speed and improved functional mobility     Baseline  3.33 ft/sec no AD    Time  4    Period  Weeks    Status  Revised    Target Date  03/26/18      PT LONG TERM GOAL #4   Title  Pt will perform stair negotiation consisting of 12 steps without rail, alternating sequence rating pain at </= 2/10    Baseline  5/10 in LLE ascending stairs without rail    Time  4    Period  Weeks    Status  Revised    Target Date  03/26/18      PT LONG TERM GOAL #6   Title  Pt will increase LE strength to overall 4+/5 when assessed in sitting to demonstrate improvement in LE strengthening     Baseline  4- to 4/5 bilaterally    Time  4    Period  Weeks    Status  Revised    Target Date  03/26/18            Plan - 03/05/18 0856    Clinical Impression Statement  Skilled session focused on continued use of upper bioness thigh  cuff to increase L quad activation during strengthening  and dynamic standing balance activities. Pt tolerated session well and was able to perform steps ups and eccentric quad strengthening with increased repetitions and step height 2/2 to improvement in muscular strength and endurance. Pt demonstrates increased challenge performing standing balance on compliant surfaces requiring intermittant UE support to stabilize. Pt reports she relies on her R LE and UE's to maintain balance 2/2 to dec confidence with her L LE during functional mobility. Pt will continue to benefit from skilled PT to address strengthening, balance, and functional mobility deficits.     Rehab Potential  Good    Clinical Impairments Affecting Rehab Potential   RA, osteoarthritis, degenerative disc disease of the spine, angioedema, DM, chronic use of systemic steroids, HTN, Scleritis, Hypokalemia     PT Frequency  2x / week    PT Duration  4 weeks    PT Treatment/Interventions  ADLs/Self Care Home Management;Gait training;Passive range of motion;Manual techniques;Stair training;Functional mobility training;Therapeutic activities;Patient/family education;Taping;Therapeutic exercise;Electrical Stimulation;Balance training;Moist Heat;Cryotherapy;Iontophoresis 4mg /ml Dexamethasone;Aquatic Therapy;Dry needling    PT Next Visit Plan  taping to improve L patella tracking.  Bioness L quad with strengthening and gait/stair training. high level balance outside on compliant surfaces/ hurdles.forward/lateral step ups, eccentric quads, side stepping. Revise HEP: focus on hip flexion, hamstring, hip ABD, ankle strengthening, forward and lateral step ups - L glute med training.  squats on bosu ball, lounges, eccentric quads, blue foam in // bars, 3 way leg strengthening with band resistance, elliptical (patient would like conditioning)    PT Home Exercise Plan  [id number], [id number], aquatic HEP?  [facility] Activity Center?    Consulted and Agree with Plan of Care  Patient       Patient will benefit from skilled  therapeutic intervention in order to improve the following deficits and impairments:  Improper body mechanics, Pain, Decreased mobility, Decreased range of motion, Decreased strength, Hypomobility, Impaired perceived functional ability, Decreased balance, Decreased safety awareness, Difficulty walking, Increased edema, Impaired flexibility  Visit Diagnosis: Muscle weakness (generalized)     Problem List Patient Active Problem List   Diagnosis Date Noted  . Unilateral primary osteoarthritis, left knee 01/10/2018  . Chronic pain of left knee 01/10/2018  . DDD (degenerative disc disease), cervical 03/22/2016  . DDD (degenerative disc disease), lumbar 03/22/2016  . 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 Angioedema 07/15/2014  . Hypokalemia 07/15/2014  . Diabetes mellitus, controlled (HCC) 07/03/2012  . HTN (hypertension) 07/03/2012  . Rheumatoid arthritis (HCC) 07/03/2012  . Current chronic use of systemic steroids 07/03/2012  . 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 [place], [name], [address] Phone: [phone]   Fax:  [phone]  Name: Beata Schmidbauer MRN: [id number] Date of Birth: 04/18/1961

## 2018-03-12 ENCOUNTER — Ambulatory Visit: Payer: Medicare Other | Admitting: Physical Therapy

## 2018-03-12 ENCOUNTER — Encounter: Payer: Self-pay | Admitting: Physical Therapy

## 2018-03-12 DIAGNOSIS — R29898 Other symptoms and signs involving the musculoskeletal system: Secondary | ICD-10-CM

## 2018-03-12 DIAGNOSIS — M6281 Muscle weakness (generalized): Secondary | ICD-10-CM

## 2018-03-12 NOTE — Therapy (Signed)
[facility] [address] [place], [name], [address] Phone: [phone]   Fax:  [phone]  Physical Therapy Treatment  Patient Details  Name: Wendy Woods MRN: [id number] Date of Birth: 15-Jun-1960 Referring Provider (PT): Norlene Campbell, MD   Encounter Date: 03/12/2018  PT End of Session - 03/12/18 1406    Visit Number  12    Number of Visits  16    Date for PT Re-Evaluation  03/26/18    Authorization Type  [employer] Medicare- Requires 10th visit PN    PT Start Time  0800    PT Stop Time  0845    PT Time Calculation (min)  45 min    Activity Tolerance  Patient tolerated treatment well    Behavior During Therapy  [facility] for tasks assessed/performed       Past Medical History:  Diagnosis Date  . DDD (degenerative disc disease), cervical 03/22/2016  . DDD (degenerative disc disease), lumbar 03/22/2016  . GERD (gastroesophageal reflux disease)   . Hyperlipidemia   . Hypertension   . Rheumatoid arthritis (HCC)   . Rheumatoid arthritis(714.0)   . 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 Type II diabetes mellitus (HCC)     Past Surgical History:  Procedure Laterality Date  . APPENDECTOMY  1968  . CATARACT EXTRACTION W/ INTRAOCULAR LENS  IMPLANT, BILATERAL Bilateral 11/2013-12/2013  . KNEE ARTHROPLASTY    . KNEE ARTHROSCOPY Left 1980's-1990's X 3  . MYOMECTOMY  1990's    There were no vitals filed for this visit.  Subjective Assessment - 03/12/18 1403    Subjective  Reports everything is going well and is exhausted from her busy weekend. Reports her lateral R knee is bothering her more than her L when walking. Reports she has an appt. with orthopedic doctor tomorrow. Reports her R wrist hurts and believes it may be related to carpal tunnel syndrome.    Pertinent History  RA, osteoarthritis, degenerative disc disease of the spine, angioedema, DM, chronic use of systemic steroids, HTN, Scleritis, Hypokalemia      Limitations  Sitting;Walking;Lifting;Standing    How long can you sit comfortably?  2 hours before having to get up and move around     How long can you walk comfortably?  Pt reports ability to walk community distances with slight pain in L knee    Patient Stated Goals  Increase L LE strength; walk as much to delay L knee surgery.     Currently in Pain?  Yes    Pain Score  5     Pain Location  Knee    Pain Orientation  Right    Pain Descriptors / Indicators  Aching    Pain Type  Chronic pain    Pain Onset  In the past 7 days    Pain Frequency  Occasional    Aggravating Factors   when walking    Pain Relieving Factors  resting    Multiple Pain Sites  Yes    Pain Score  4    Pain Location  Knee    Pain Orientation  Left    Pain Descriptors / Indicators  Aching    Pain Type  Chronic pain    Pain Onset  More than a month ago    Pain Frequency  Constant    Aggravating Factors   sitting for prolonged period    Pain Relieving Factors  activity with movement         [facility] Adult PT Treatment/Exercise -  03/12/18 1404      Balance   Balance Assessed  Yes      Dynamic Standing Balance   Dynamic Standing - Comments  Pt performs dynamic standing balance incorporating cone tapping in all planes using counter top as needed for steadying. Pt performs cone tapping with bioness L upper thigh cuff providing quad stimulation in training mode. Pt performs multiple trials of cone tapping with each LE to increase glut med activation with increased stance time durations      Therapeutic Activites    Therapeutic Activities  Other Therapeutic Activities    Other Therapeutic Activities  Therapist performs Phalen's test to assess for CTS to R wrist. Test (-) for CTS with therapist providing education regarding strategies to reduce compression on median nerve during functional task performance.       Exercises   Exercises  Knee/Hip;Other Exercises    Other Exercises   Pt performs 2x5 reps of STS from  physioball holding 3.3# wt ball performing chest press during stands. Pt perfoms STS activity with bioness L upper cuff providing quad stimulation for strengthening purposes on training mode. Pt requires verbal cues to perform each rep with eccentric lower to increase quad activation.       Knee/Hip Exercises: Aerobic   Tread Mill  Pt performs treadmill walking for 10 minute duration with L upper bioness cuff providing quad stimulation in gait mode. Pt performs 5 min at 1.2 MPH at 1% incline progressing to 3% incline for increased challenge.       Modalities   Modalities  Geologist, engineering Location  L quad (lower cuff on for sensor but no stimulation delivered)    Electrical Stimulation Action  closed and open chain knee extension    Electrical Stimulation Parameters  see tablet 1    Electrical Stimulation Goals  Strength;Pain;Other (comment)   L patellar tracking            PT Education - 03/12/18 1406    Education Details  Therapist provided education regarding clinical findings from Phalen's assessment for CTS and POC moving forward.     Person(s) Educated  Patient    Methods  Explanation    Comprehension  Verbalized understanding       PT Short Term Goals - 02/22/18 1232      PT SHORT TERM GOAL #1   Title  STG=LTG'S        PT Long Term Goals - 02/22/18 1232      PT LONG TERM GOAL #1   Title  Pt will demonstrate independence with land and aquatic HEP as well as initiate community wellness program at gym/activity center    Time  4    Period  Weeks    Status  Revised    Target Date  03/26/18      PT LONG TERM GOAL #2   Title  Pt will improve gait speed to >/= 4.00 ft/sec indicative of normal walking speed and improved functional mobility     Baseline  3.33 ft/sec no AD    Time  4    Period  Weeks    Status  Revised    Target Date  03/26/18      PT LONG TERM GOAL #4   Title  Pt will perform stair  negotiation consisting of 12 steps without rail, alternating sequence rating pain at </= 2/10    Baseline  5/10 in LLE ascending stairs  without rail    Time  4    Period  Weeks    Status  Revised    Target Date  03/26/18      PT LONG TERM GOAL #6   Title  Pt will increase LE strength to overall 4+/5 when assessed in sitting to demonstrate improvement in LE strengthening     Baseline  4- to 4/5 bilaterally    Time  4    Period  Weeks    Status  Revised    Target Date  03/26/18            Plan - 03/12/18 1403    Clinical Impression Statement  Skilled session focused on LE strengthening and dynamic standing balance with Bioness L upper thigh cuff providing quad stimulation with exercises. Pt tolerates session well and demonstrates ability to performing LE strengthening during functional mobility tasks with increased challenge. Pt demonstrates difficulty maintaining balance during SLS requiring UE assist on counter top for steadying. Pt will continue to benefit from skilled PT to address strength, balance, and functional mobility deficits.     Rehab Potential  Good    Clinical Impairments Affecting Rehab Potential   RA, osteoarthritis, degenerative disc disease of the spine, angioedema, DM, chronic use of systemic steroids, HTN, Scleritis, Hypokalemia     PT Frequency  2x / week    PT Duration  4 weeks    PT Treatment/Interventions  ADLs/Self Care Home Management;Gait training;Passive range of motion;Manual techniques;Stair training;Functional mobility training;Therapeutic activities;Patient/family education;Taping;Therapeutic exercise;Electrical Stimulation;Balance training;Moist Heat;Cryotherapy;Iontophoresis 4mg /ml Dexamethasone;Aquatic Therapy;Dry needling    PT Next Visit Plan  taping to improve L patella tracking. corner HEP  Bioness L quad with strengthening and gait/stair training. high level balance outside on compliant surfaces/ hurdles.forward/lateral step ups, eccentric quads,  side stepping. Revise HEP: focus on hip flexion, hamstring, hip ABD, ankle strengthening, forward and lateral step ups - L glute med training.  squats on bosu ball, lounges, eccentric quads, blue foam in // bars, 3 way leg strengthening with band resistance, elliptical (patient would like conditioning)    PT Home Exercise Plan  [id number], [id number], aquatic HEP?  [facility] Activity Center?    Consulted and Agree with Plan of Care  Patient       Patient will benefit from skilled therapeutic intervention in order to improve the following deficits and impairments:  Improper body mechanics, Pain, Decreased mobility, Decreased range of motion, Decreased strength, Hypomobility, Impaired perceived functional ability, Decreased balance, Decreased safety awareness, Difficulty walking, Increased edema, Impaired flexibility  Visit Diagnosis: Muscle weakness (generalized)  Other symptoms and signs involving the musculoskeletal system     Problem List Patient Active Problem List   Diagnosis Date Noted  . Unilateral primary osteoarthritis, left knee 01/10/2018  . Chronic pain of left knee 01/10/2018  . DDD (degenerative disc disease), cervical 03/22/2016  . DDD (degenerative disc disease), lumbar 03/22/2016  . 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 Angioedema 07/15/2014  . Hypokalemia 07/15/2014  . Diabetes mellitus, controlled (HCC) 07/03/2012  . HTN (hypertension) 07/03/2012  . Rheumatoid arthritis (HCC) 07/03/2012  . Current chronic use of systemic steroids 07/03/2012  . 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 [place], [place], [name] Phone: [phone]   Fax:  [phone]  Name: Wendy Woods MRN: Estrella Deeds Date of Birth: 02-03-1961

## 2018-03-12 NOTE — Therapy (Unsigned)
[facility] [address] [place], [name], [address] Phone: [phone]   Fax:  [phone]  Physical Therapy Treatment  Patient Details  Name: Wendy Woods MRN: [id number] Date of Birth: [birth year] Referring Provider (PT): Norlene Campbell, MD   Encounter Date: 03/13/2018  PT End of Session - 03/12/18 1236    Visit Number  12    Number of Visits  16    Date for PT Re-Evaluation  03/26/18    Authorization Type  [employer] Medicare- Requires 10th visit PN    PT Start Time  0800    PT Stop Time  0845    PT Time Calculation (min)  45 min    Activity Tolerance  Patient tolerated treatment well    Behavior During Therapy  [facility] for tasks assessed/performed       Past Medical History:  Diagnosis Date  . DDD (degenerative disc disease), cervical 03/22/2016  . DDD (degenerative disc disease), lumbar 03/22/2016  . GERD (gastroesophageal reflux disease)   . Hyperlipidemia   . Hypertension   . Rheumatoid arthritis (HCC)   . Rheumatoid arthritis(714.0)   . 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 Type II diabetes mellitus (HCC)     Past Surgical History:  Procedure Laterality Date  . APPENDECTOMY  1968  . CATARACT EXTRACTION W/ INTRAOCULAR LENS  IMPLANT, BILATERAL Bilateral 11/2013-12/2013  . KNEE ARTHROPLASTY    . KNEE ARTHROSCOPY Left 1980's-1990's X 3  . MYOMECTOMY  1990's    There were no vitals filed for this visit.  Subjective Assessment - 03/12/18 0802    Subjective  Reports everything is going well and is exhausted from her busy weekend. Reports her lateral R knee is bothering her more than her L when walking. Reports she has an appt. with orthopedic doctor tomorrow. Reports her R wrist hurts and believes it may be related to carpal tunnel syndrome.    Pertinent History  RA, osteoarthritis, degenerative disc disease of the spine, angioedema, DM, chronic use of systemic steroids, HTN, Scleritis, Hypokalemia      Limitations  Sitting;Walking;Lifting;Standing    How long can you sit comfortably?  2 hours before having to get up and move around     How long can you walk comfortably?  Pt reports ability to walk community distances with slight pain in L knee    Patient Stated Goals  Increase L LE strength; walk as much to delay L knee surgery.     Currently in Pain?  Yes    Pain Score  5     Pain Location  Knee    Pain Orientation  Right    Pain Descriptors / Indicators  Aching    Pain Onset  In the past 7 days    Pain Frequency  Occasional    Aggravating Factors   when walking     Pain Relieving Factors  resting     Multiple Pain Sites  Yes    Pain Score  4    Pain Location  Knee    Pain Orientation  Left    Pain Descriptors / Indicators  Aching    Pain Type  Chronic pain    Pain Onset  More than a month ago    Pain Frequency  Constant    Aggravating Factors   sitting for prolonged period     Pain Relieving Factors  activity and movement        [facility] Adult PT Treatment/Exercise - 03/12/18 1229  Balance   Balance Assessed  Yes      Dynamic Standing Balance   Dynamic Standing - Comments  Pt performs dynamic standing balance incorporating cone tapping in all planes using counter top as needed for steadying. Pt performs cone tapping with bioness L upper thigh cuff providing quad stimulation in training mode. Pt performs multiple trials of cone tapping with each LE to increase glut med activation with increased stance time durations      Therapeutic Activites    Therapeutic Activities  Other Therapeutic Activities    Other Therapeutic Activities  Therapist performs Phalen's test to assess for CTS to R wrist. Test (-) for CTS with therapist providing education regarding strategies to reduce compression on median nerve during functional task performance.       Exercises   Exercises  Knee/Hip;Other Exercises    Other Exercises   Pt performs 2x5 reps of STS from physioball holding 3.3# wt ball  performing chest press during stands. Pt perfoms STS activity with bioness L upper cuff providing quad stimulation for strengthening purposes on training mode. Pt requires verbal cues to perform each rep with eccentric lower to increase quad activation.       Knee/Hip Exercises: Aerobic   Tread Mill  Pt performs treadmill walking for 10 minute duration with L upper bioness cuff providing quad stimulation in gait mode. Pt performs 5 min at 1.2 MPH at 1% incline progressing to 3% incline for increased challenge.       Modalities   Modalities  Geologist, engineering Location  L quad (lower cuff on for sensor but no stimulation delivered)    Electrical Stimulation Action  closed and open chain knee extension    Electrical Stimulation Parameters  see tablet 1    Electrical Stimulation Goals  Strength;Pain;Other (comment)   L patellar tracking            PT Education - 03/12/18 1236    Education Details  Therapist provided education regarding clinical findings from Phalen's assessment for CTS and POC moving forward.     Person(s) Educated  Patient    Methods  Explanation    Comprehension  Verbalized understanding       PT Short Term Goals - 02/22/18 1232      PT SHORT TERM GOAL #1   Title  STG=LTG'S        PT Long Term Goals - 02/22/18 1232      PT LONG TERM GOAL #1   Title  Pt will demonstrate independence with land and aquatic HEP as well as initiate community wellness program at gym/activity center    Time  4    Period  Weeks    Status  Revised    Target Date  03/26/18      PT LONG TERM GOAL #2   Title  Pt will improve gait speed to >/= 4.00 ft/sec indicative of normal walking speed and improved functional mobility     Baseline  3.33 ft/sec no AD    Time  4    Period  Weeks    Status  Revised    Target Date  03/26/18      PT LONG TERM GOAL #4   Title  Pt will perform stair negotiation consisting of 12 steps  without rail, alternating sequence rating pain at </= 2/10    Baseline  5/10 in LLE ascending stairs without rail    Time  4    Period  Weeks    Status  Revised    Target Date  03/26/18      PT LONG TERM GOAL #6   Title  Pt will increase LE strength to overall 4+/5 when assessed in sitting to demonstrate improvement in LE strengthening     Baseline  4- to 4/5 bilaterally    Time  4    Period  Weeks    Status  Revised    Target Date  03/26/18            Plan - 03/12/18 1237    Clinical Impression Statement  Skilled session focused on LE strengthening and dynamic standing balance with Bioness L upper thigh cuff providing quad stimulation with exercises. Pt tolerates session well and demonstrates ability to performing LE strengthening during functional mobility tasks with increased challenge. Pt demonstrates difficulty maintaining balance during SLS requiring UE assist on counter top for steadying. Pt will continue to benefit from skilled PT to address strength, balance, and functional mobility deficits.     Rehab Potential  Good    Clinical Impairments Affecting Rehab Potential   RA, osteoarthritis, degenerative disc disease of the spine, angioedema, DM, chronic use of systemic steroids, HTN, Scleritis, Hypokalemia     PT Frequency  2x / week    PT Duration  4 weeks    PT Treatment/Interventions  ADLs/Self Care Home Management;Gait training;Passive range of motion;Manual techniques;Stair training;Functional mobility training;Therapeutic activities;Patient/family education;Taping;Therapeutic exercise;Electrical Stimulation;Balance training;Moist Heat;Cryotherapy;Iontophoresis 4mg /ml Dexamethasone;Aquatic Therapy;Dry needling    PT Next Visit Plan  taping to improve L patella tracking. corner HEP  Bioness L quad with strengthening and gait/stair training. high level balance outside on compliant surfaces/ hurdles.forward/lateral step ups, eccentric quads, side stepping. Revise HEP: focus on  hip flexion, hamstring, hip ABD, ankle strengthening, forward and lateral step ups - L glute med training.  squats on bosu ball, lounges, eccentric quads, blue foam in // bars, 3 way leg strengthening with band resistance, elliptical (patient would like conditioning)    PT Home Exercise Plan  [id number], [id number], aquatic HEP?  [facility] Activity Center?    Consulted and Agree with Plan of Care  Patient       Patient will benefit from skilled therapeutic intervention in order to improve the following deficits and impairments:  Improper body mechanics, Pain, Decreased mobility, Decreased range of motion, Decreased strength, Hypomobility, Impaired perceived functional ability, Decreased balance, Decreased safety awareness, Difficulty walking, Increased edema, Impaired flexibility  Visit Diagnosis: Muscle weakness (generalized)  Other symptoms and signs involving the musculoskeletal system     Problem List Patient Active Problem List   Diagnosis Date Noted  . Unilateral primary osteoarthritis, left knee 01/10/2018  . Chronic pain of left knee 01/10/2018  . DDD (degenerative disc disease), cervical 03/22/2016  . DDD (degenerative disc disease), lumbar 03/22/2016  . 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 Angioedema 07/15/2014  . Hypokalemia 07/15/2014  . Diabetes mellitus, controlled (HCC) 07/03/2012  . HTN (hypertension) 07/03/2012  . Rheumatoid arthritis (HCC) 07/03/2012  . Current chronic use of systemic steroids 07/03/2012  . 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

## 2018-03-13 ENCOUNTER — Ambulatory Visit: Payer: Medicare Other | Admitting: Physical Therapy

## 2018-03-13 ENCOUNTER — Encounter: Payer: Self-pay | Admitting: Physical Therapy

## 2018-03-13 DIAGNOSIS — M6281 Muscle weakness (generalized): Secondary | ICD-10-CM

## 2018-03-13 DIAGNOSIS — R29898 Other symptoms and signs involving the musculoskeletal system: Secondary | ICD-10-CM

## 2018-03-13 DIAGNOSIS — G8929 Other chronic pain: Secondary | ICD-10-CM

## 2018-03-13 DIAGNOSIS — M25562 Pain in left knee: Secondary | ICD-10-CM

## 2018-03-13 NOTE — Therapy (Signed)
[facility] [address] [place], [name], [address] Phone: [phone]   Fax:  [phone]  Physical Therapy Treatment  Patient Details  Name: Wendy Woods MRN: [id number] Date of Birth: 05/14/1960 Referring Provider (PT): Norlene Campbell, MD   Encounter Date: 03/13/2018  PT End of Session - 03/13/18 0804    Visit Number  13    Number of Visits  16    Date for PT Re-Evaluation  03/26/18    Authorization Type  [employer] Medicare- Requires 10th visit PN    PT Start Time  0800    Activity Tolerance  Patient tolerated treatment well    Behavior During Therapy  [facility] for tasks assessed/performed       Past Medical History:  Diagnosis Date  . DDD (degenerative disc disease), cervical 03/22/2016  . DDD (degenerative disc disease), lumbar 03/22/2016  . GERD (gastroesophageal reflux disease)   . Hyperlipidemia   . Hypertension   . Rheumatoid arthritis (HCC)   . Rheumatoid arthritis(714.0)   . 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 Type II diabetes mellitus (HCC)     Past Surgical History:  Procedure Laterality Date  . APPENDECTOMY  1968  . CATARACT EXTRACTION W/ INTRAOCULAR LENS  IMPLANT, BILATERAL Bilateral 11/2013-12/2013  . KNEE ARTHROPLASTY    . KNEE ARTHROSCOPY Left 1980's-1990's X 3  . MYOMECTOMY  1990's    There were no vitals filed for this visit.  Subjective Assessment - 03/13/18 0804    Subjective  just a little stiff - typical for mornings    Pertinent History  RA, osteoarthritis, degenerative disc disease of the spine, angioedema, DM, chronic use of systemic steroids, HTN, Scleritis, Hypokalemia     Patient Stated Goals  Increase L LE strength; walk as much to delay L knee surgery.     Currently in Pain?  No/denies    Pain Score  0-No pain                       [facility] Adult PT Treatment/Exercise - 03/13/18 0001      Exercises   Exercises  Knee/Hip    Other Exercises   B isometric hip  flexion for core activation 5 sec x 10 reps      Knee/Hip Exercises: Machines for Strengthening   Cybex Leg Press  50# B LE x 15; 30# B con/L ecc x 15      Knee/Hip Exercises: Standing   Wall Squat  2 sets;10 reps;3 seconds    Wall Squat Limitations  green physioball at wall      Knee/Hip Exercises: Supine   Bridges  Strengthening;Both;2 sets;10 reps    Bridges Limitations  B LE extended on pysioball    Other Supine Knee/Hip Exercises  HS bridge + curl on blue physioball x 10 reps    Other Supine Knee/Hip Exercises  bridge + LAQ x 10 each LE       Knee/Hip Exercises: Prone   Hamstring Curl  2 sets;10 reps;3 seconds   3# - L LE only   Hip Extension  Strengthening;Left;2 sets;10 reps   3#              PT Short Term Goals - 02/22/18 1232      PT SHORT TERM GOAL #1   Title  STG=LTG'S        PT Long Term Goals - 02/22/18 1232      PT LONG TERM GOAL #1   Title  Pt  will demonstrate independence with land and aquatic HEP as well as initiate community wellness program at gym/activity center    Time  4    Period  Weeks    Status  Revised    Target Date  03/26/18      PT LONG TERM GOAL #2   Title  Pt will improve gait speed to >/= 4.00 ft/sec indicative of normal walking speed and improved functional mobility     Baseline  3.33 ft/sec no AD    Time  4    Period  Weeks    Status  Revised    Target Date  03/26/18      PT LONG TERM GOAL #4   Title  Pt will perform stair negotiation consisting of 12 steps without rail, alternating sequence rating pain at </= 2/10    Baseline  5/10 in LLE ascending stairs without rail    Time  4    Period  Weeks    Status  Revised    Target Date  03/26/18      PT LONG TERM GOAL #6   Title  Pt will increase LE strength to overall 4+/5 when assessed in sitting to demonstrate improvement in LE strengthening     Baseline  4- to 4/5 bilaterally    Time  4    Period  Weeks    Status  Revised    Target Date  03/26/18             Plan - 03/13/18 4825    Clinical Impression Statement  Session today focusing largely on hamstring activation and strengthening for dynamic support of knee. Requires some increased motivation to complete each task as patient does not like to feel muscle fatigue/soreness. Does requires rest breaks between sets/reps to prevent fatigue and soreness as well as verbal cueing for form and control of movement. Patient making good progress towards LTGs.     Rehab Potential  Good    Clinical Impairments Affecting Rehab Potential   RA, osteoarthritis, degenerative disc disease of the spine, angioedema, DM, chronic use of systemic steroids, HTN, Scleritis, Hypokalemia     PT Frequency  2x / week    PT Duration  4 weeks    PT Treatment/Interventions  ADLs/Self Care Home Management;Gait training;Passive range of motion;Manual techniques;Stair training;Functional mobility training;Therapeutic activities;Patient/family education;Taping;Therapeutic exercise;Electrical Stimulation;Balance training;Moist Heat;Cryotherapy;Iontophoresis 4mg /ml Dexamethasone;Aquatic Therapy;Dry needling    PT Next Visit Plan  taping to improve L patella tracking. corner HEP  Bioness L quad with strengthening and gait/stair training. high level balance outside on compliant surfaces/ hurdles.forward/lateral step ups, eccentric quads, side stepping. Revise HEP: focus on hip flexion, hamstring, hip ABD, ankle strengthening, forward and lateral step ups - L glute med training.  squats on bosu ball, lounges, eccentric quads, blue foam in // bars, 3 way leg strengthening with band resistance, elliptical (patient would like conditioning)    PT Home Exercise Plan  [id number], [id number], aquatic HEP?  [facility] Activity Center?    Consulted and Agree with Plan of Care  Patient       Patient will benefit from skilled therapeutic intervention in order to improve the following deficits and impairments:  Improper body mechanics, Pain, Decreased  mobility, Decreased range of motion, Decreased strength, Hypomobility, Impaired perceived functional ability, Decreased balance, Decreased safety awareness, Difficulty walking, Increased edema, Impaired flexibility  Visit Diagnosis: Muscle weakness (generalized)  Other symptoms and signs involving the musculoskeletal system  Chronic pain of left knee  Problem List Patient Active Problem List   Diagnosis Date Noted  . Unilateral primary osteoarthritis, left knee 01/10/2018  . Chronic pain of left knee 01/10/2018  . DDD (degenerative disc disease), cervical 03/22/2016  . DDD (degenerative disc disease), lumbar 03/22/2016  . 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 Angioedema 07/15/2014  . Hypokalemia 07/15/2014  . Diabetes mellitus, controlled (HCC) 07/03/2012  . HTN (hypertension) 07/03/2012  . Rheumatoid arthritis (HCC) 07/03/2012  . Current chronic use of systemic steroids 07/03/2012  . 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

## 2018-03-14 ENCOUNTER — Ambulatory Visit (INDEPENDENT_AMBULATORY_CARE_PROVIDER_SITE_OTHER): Payer: Medicare Other | Admitting: Orthopaedic Surgery

## 2018-03-14 ENCOUNTER — Encounter (INDEPENDENT_AMBULATORY_CARE_PROVIDER_SITE_OTHER): Payer: Self-pay | Admitting: Orthopaedic Surgery

## 2018-03-14 VITALS — BP 124/84 | HR 64 | Ht 64.5 in | Wt 177.0 lb

## 2018-03-14 DIAGNOSIS — M25562 Pain in left knee: Secondary | ICD-10-CM | POA: Diagnosis not present

## 2018-03-14 DIAGNOSIS — G8929 Other chronic pain: Secondary | ICD-10-CM

## 2018-03-14 NOTE — Progress Notes (Signed)
Office Visit Note   Patient: Wendy Woods           Date of Birth: 01/31/1961           MRN: [id number] Visit Date: 03/14/2018              Requested by: Fleet Contras, MD [address] [place], [name] [address] PCP: Fleet Contras, MD   Assessment & Plan: Visit Diagnoses:  1. Chronic pain of left knee     Plan: Osteoarthritis left knee.  Small horizontal tear medial meniscus that I do not think is symptomatic.  Long discussion today regarding different treatment options.  Is taking Remicade for rheumatoid arthritis.  Can try CBD oil.  Will pre-CERT Visco supplementation  Follow-Up Instructions: Return pre cert visco.   Orders:  No orders of the defined types were placed in this encounter.  No orders of the defined types were placed in this encounter.     Procedures: No procedures performed   Clinical Data: No additional findings.   Subjective: Chief Complaint  Patient presents with  . Follow-up    2 MO F/U L KNEE PAIN PT HELPED SOME BUT STILL HAS SOME SWELLING AND PAIN  Wendy Woods has evidence of osteoarthritis of her left knee by plain film and MRI scan.  She has tried cortisone injections.  She is on Remicade for rheumatoid arthritis can take Tylenol but not NSAIDs.  Still having some discomfort along the medial aspect of her left knee and beneath the patella.  However, she is better since she started physical therapy and gaining "strength".  She would like to consider further treatment.  HPI  Review of Systems  Constitutional: Negative for fatigue.  HENT: Negative for ear pain.   Eyes: Negative for pain.  Respiratory: Negative for cough and shortness of breath.   Cardiovascular: Positive for leg swelling.  Gastrointestinal: Negative for constipation and diarrhea.  Genitourinary: Negative for difficulty urinating.  Musculoskeletal: Negative for back pain and neck pain.  Skin: Positive for rash.  Allergic/Immunologic: Negative for food allergies.    Neurological: Positive for weakness. Negative for numbness.  Hematological: Does not bruise/bleed easily.  Psychiatric/Behavioral: Negative for sleep disturbance.     Objective: Vital Signs: BP 124/84 (BP Location: Left Arm, Patient Position: Sitting, Cuff Size: Normal)   Pulse 64   Ht 5' 4.5" (1.638 m)   Wt 177 lb (80.3 kg)   LMP 07/09/2014   BMI 29.91 kg/m   Physical Exam  Constitutional: She is oriented to person, place, and time. She appears well-developed and well-nourished.  HENT:  Mouth/Throat: Oropharynx is clear and moist.  Eyes: Pupils are equal, round, and reactive to light. EOM are normal.  Pulmonary/Chest: Effort normal.  Neurological: She is alert and oriented to person, place, and time.  Skin: Skin is warm and dry.  Psychiatric: She has a normal mood and affect. Her behavior is normal.    Ortho Exam awake alert and oriented x3.  Comfortable sitting.  Full left knee extension.  No effusion.  Knee was not hot red warm or swollen.  Flexed over 105 degrees without instability.  No calf pain.  Vascular exam intact Specialty Comments:  No specialty comments available.  Imaging: No results found.   PMFS History: Patient Active Problem List   Diagnosis Date Noted  . Unilateral primary osteoarthritis, left knee 01/10/2018  . Chronic pain of left knee 01/10/2018  . DDD (degenerative disc disease), cervical 03/22/2016  . DDD (degenerative disc disease), lumbar 03/22/2016  .  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 Angioedema 07/15/2014  . Hypokalemia 07/15/2014  . Diabetes mellitus, controlled (HCC) 07/03/2012  . HTN (hypertension) 07/03/2012  . Rheumatoid arthritis (HCC) 07/03/2012  . Current chronic use of systemic steroids 07/03/2012  . 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 DDD (degenerative disc disease), cervical 03/22/2016  . DDD (degenerative disc disease), lumbar 03/22/2016  . GERD (gastroesophageal reflux disease)   .  Hyperlipidemia   . Hypertension   . Rheumatoid arthritis (HCC)   . Rheumatoid arthritis(714.0)   . 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 Type II diabetes mellitus (HCC)     Family History  Problem Relation Age of Onset  . Colon polyps Father   . Prostate cancer Father   . Breast cancer Cousin 26  . Colon cancer Neg Hx     Past Surgical History:  Procedure Laterality Date  . APPENDECTOMY  1968  . CATARACT EXTRACTION W/ INTRAOCULAR LENS  IMPLANT, BILATERAL Bilateral 11/2013-12/2013  . KNEE ARTHROPLASTY    . KNEE ARTHROSCOPY Left 1980's-1990's X 3  . MYOMECTOMY  1990's   Social History   Occupational History  . Not on file  Tobacco Use  . Smoking status: Never Smoker  . Smokeless tobacco: Never Used  Substance and Sexual Activity  . Alcohol use: No    Alcohol/week: 0.0 standard drinks  . Drug use: No  . Sexual activity: Not Currently

## 2018-03-15 ENCOUNTER — Other Ambulatory Visit: Payer: Self-pay | Admitting: *Deleted

## 2018-03-15 ENCOUNTER — Ambulatory Visit (INDEPENDENT_AMBULATORY_CARE_PROVIDER_SITE_OTHER): Payer: Self-pay | Admitting: Orthopaedic Surgery

## 2018-03-15 NOTE — Progress Notes (Signed)
Infusion orders are current for patient CBC CMP Tylenol Benadryl appointments are up to date and follow up appointment  is scheduled TB gold not due yet.  

## 2018-03-18 ENCOUNTER — Ambulatory Visit (HOSPITAL_COMMUNITY)
Admission: RE | Admit: 2018-03-18 | Discharge: 2018-03-18 | Disposition: A | Discharge status: Home / Self Care | Payer: Medicare Other | Source: Ambulatory Visit | Attending: Rheumatology | Admitting: Rheumatology

## 2018-03-18 DIAGNOSIS — M0579 Rheumatoid arthritis with rheumatoid factor of multiple sites without organ or systems involvement: Secondary | ICD-10-CM | POA: Diagnosis present

## 2018-03-18 MED ORDER — DIPHENHYDRAMINE HCL 25 MG PO CAPS
ORAL_CAPSULE | ORAL | Status: AC
  Filled 2018-03-18: qty 1

## 2018-03-18 MED ORDER — SODIUM CHLORIDE 0.9 % IV SOLN
500.0000 mg | INTRAVENOUS | Status: DC
  Administered 2018-03-18: 500 mg via INTRAVENOUS
  Filled 2018-03-18: qty 50

## 2018-03-18 MED ORDER — DIPHENHYDRAMINE HCL 25 MG PO CAPS
25.0000 mg | ORAL_CAPSULE | ORAL | Status: AC
  Administered 2018-03-18: 25 mg via ORAL

## 2018-03-18 MED ORDER — ACETAMINOPHEN 325 MG PO TABS
650.0000 mg | ORAL_TABLET | ORAL | Status: AC
  Administered 2018-03-18: 650 mg via ORAL

## 2018-03-18 MED ORDER — ACETAMINOPHEN 325 MG PO TABS
ORAL_TABLET | ORAL | Status: AC
  Filled 2018-03-18: qty 2

## 2018-03-19 ENCOUNTER — Telehealth (INDEPENDENT_AMBULATORY_CARE_PROVIDER_SITE_OTHER): Payer: Self-pay

## 2018-03-19 ENCOUNTER — Encounter: Payer: Self-pay | Admitting: Physical Therapy

## 2018-03-19 ENCOUNTER — Ambulatory Visit: Payer: Medicare Other | Admitting: Physical Therapy

## 2018-03-19 DIAGNOSIS — G8929 Other chronic pain: Secondary | ICD-10-CM

## 2018-03-19 DIAGNOSIS — R29898 Other symptoms and signs involving the musculoskeletal system: Secondary | ICD-10-CM

## 2018-03-19 DIAGNOSIS — M6281 Muscle weakness (generalized): Secondary | ICD-10-CM | POA: Diagnosis not present

## 2018-03-19 DIAGNOSIS — M25562 Pain in left knee: Secondary | ICD-10-CM

## 2018-03-19 NOTE — Telephone Encounter (Signed)
Submitted VOB for Synvisc series, left knee. 

## 2018-03-19 NOTE — Therapy (Signed)
[facility] [address] [place], [name], [address] Phone: [phone]   Fax:  [phone]  Physical Therapy Treatment  Patient Details  Name: Wendy Woods MRN: [id number] Date of Birth: 1961/03/26 Referring Provider (PT): Norlene Campbell, MD   Encounter Date: 03/19/2018  PT End of Session - 03/19/18 2118    Visit Number  14    Number of Visits  16    Date for PT Re-Evaluation  03/26/18    Authorization Type  [employer] Medicare- Requires 10th visit PN    PT Start Time  0805    PT Stop Time  0848    PT Time Calculation (min)  43 min    Activity Tolerance  Patient tolerated treatment well    Behavior During Therapy  [facility] for tasks assessed/performed       Past Medical History:  Diagnosis Date  . DDD (degenerative disc disease), cervical 03/22/2016  . DDD (degenerative disc disease), lumbar 03/22/2016  . GERD (gastroesophageal reflux disease)   . Hyperlipidemia   . Hypertension   . Rheumatoid arthritis (HCC)   . Rheumatoid arthritis(714.0)   . 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 Type II diabetes mellitus (HCC)     Past Surgical History:  Procedure Laterality Date  . APPENDECTOMY  1968  . CATARACT EXTRACTION W/ INTRAOCULAR LENS  IMPLANT, BILATERAL Bilateral 11/2013-12/2013  . KNEE ARTHROPLASTY    . KNEE ARTHROSCOPY Left 1980's-1990's X 3  . MYOMECTOMY  1990's    There were no vitals filed for this visit.  Subjective Assessment - 03/19/18 0819    Subjective  Discussed continuation of therapy after next week to allow for aquatic therapy.  Has not been to [facility] yet.  Liked the treadmill and leg press.    Pertinent History  RA, osteoarthritis, degenerative disc disease of the spine, angioedema, DM, chronic use of systemic steroids, HTN, Scleritis, Hypokalemia     Patient Stated Goals  Increase L LE strength; walk as much to delay L knee surgery.     Currently in Pain?  Yes                        [facility] Adult PT Treatment/Exercise - 03/19/18 0840      Ambulation/Gait   Stairs  Yes    Stairs Assistance  6: Modified independent (Device/Increase time)    Stair Management Technique  No rails;Alternating pattern;Forwards    Number of Stairs  12    Height of Stairs  6    Gait Comments  reported pain in lateral knee 6/10 when descending      Exercises   Exercises  Knee/Hip      Knee/Hip Exercises: Stretches   ITB Stretch  Right;Left;2 reps;30 seconds    ITB Stretch Limitations  supine with belt      Knee/Hip Exercises: Standing   Hip Abduction  Stengthening;Right;Left;2 sets;5 reps    Abduction Limitations  closed chain on 6" step, 2" step and then ramp to allow pt to activate glute med instead of knee flexion or trunk lateral flexion.             PT Education - 03/19/18 2115    Education Details  Plan to renew to allow pt to participate in aquatic therapy to learn aquatic HEP; closed chain hip ABD training    Person(s) Educated  Patient    Methods  Explanation    Comprehension  Verbalized understanding  PT Short Term Goals - 02/22/18 1232      PT SHORT TERM GOAL #1   Title  STG=LTG'S        PT Long Term Goals - 02/22/18 1232      PT LONG TERM GOAL #1   Title  Pt will demonstrate independence with land and aquatic HEP as well as initiate community wellness program at gym/activity center    Time  4    Period  Weeks    Status  Revised    Target Date  03/26/18      PT LONG TERM GOAL #2   Title  Pt will improve gait speed to >/= 4.00 ft/sec indicative of normal walking speed and improved functional mobility     Baseline  3.33 ft/sec no AD    Time  4    Period  Weeks    Status  Revised    Target Date  03/26/18      PT LONG TERM GOAL #4   Title  Pt will perform stair negotiation consisting of 12 steps without rail, alternating sequence rating pain at </= 2/10    Baseline  5/10 in LLE ascending stairs without rail     Time  4    Period  Weeks    Status  Revised    Target Date  03/26/18      PT LONG TERM GOAL #6   Title  Pt will increase LE strength to overall 4+/5 when assessed in sitting to demonstrate improvement in LE strengthening     Baseline  4- to 4/5 bilaterally    Time  4    Period  Weeks    Status  Revised    Target Date  03/26/18            Plan - 03/19/18 2120    Clinical Impression Statement  Performed reassessment of pain with stairs; pt continues to report 6/10 pain in lateral knee.  Continued to focus on closed chain hip ABD and proximal stability training.  Pt unable to isolate glute med activation for pelvic drops/elevation when standing on step and then 2" step.  Able to activate when standing sideways on ramp but continued to require max visual, verbal and tactile cues to perform with correct technique.  Ended session with supine lateral thigh stretches and discussed plan for aquatic therapy, HEP and renewal of visits.     Rehab Potential  Good    Clinical Impairments Affecting Rehab Potential   RA, osteoarthritis, degenerative disc disease of the spine, angioedema, DM, chronic use of systemic steroids, HTN, Scleritis, Hypokalemia     PT Frequency  2x / week    PT Duration  4 weeks    PT Treatment/Interventions  ADLs/Self Care Home Management;Gait training;Passive range of motion;Manual techniques;Stair training;Functional mobility training;Therapeutic activities;Patient/family education;Taping;Therapeutic exercise;Electrical Stimulation;Balance training;Moist Heat;Cryotherapy;Iontophoresis 4mg /ml Dexamethasone;Aquatic Therapy;Dry needling    PT Next Visit Plan  schedule more visits 2x/week x 4 more weeks EXCEPT the aquatic days.  Bioness L quad with strengthening, gait/stair training.  Work on closed chain HIP ABD training.  Begin to check LTG and recertify on 11/26 for 2x/week x 4 more weeks.  2 visits will be Aquatic.  Condense HEP below.   Has she visited the [facility]?    PT Home Exercise Plan  [id number], [id number], aquatic HEP?  [facility]?    Consulted and Agree with Plan of Care  Patient       Patient will benefit  from skilled therapeutic intervention in order to improve the following deficits and impairments:  Improper body mechanics, Pain, Decreased mobility, Decreased range of motion, Decreased strength, Hypomobility, Impaired perceived functional ability, Decreased balance, Decreased safety awareness, Difficulty walking, Increased edema, Impaired flexibility  Visit Diagnosis: Muscle weakness (generalized)  Other symptoms and signs involving the musculoskeletal system  Chronic pain of left knee     Problem List Patient Active Problem List   Diagnosis Date Noted  . Unilateral primary osteoarthritis, left knee 01/10/2018  . Chronic pain of left knee 01/10/2018  . DDD (degenerative disc disease), cervical 03/22/2016  . DDD (degenerative disc disease), lumbar 03/22/2016  . 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 Angioedema 07/15/2014  . Hypokalemia 07/15/2014  . Diabetes mellitus, controlled (HCC) 07/03/2012  . HTN (hypertension) 07/03/2012  . Rheumatoid arthritis (HCC) 07/03/2012  . Current chronic use of systemic steroids 07/03/2012  . 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

## 2018-03-22 ENCOUNTER — Encounter: Payer: Self-pay | Admitting: Physical Therapy

## 2018-03-22 ENCOUNTER — Ambulatory Visit: Payer: Medicare Other | Admitting: Physical Therapy

## 2018-03-22 DIAGNOSIS — M6281 Muscle weakness (generalized): Secondary | ICD-10-CM

## 2018-03-22 DIAGNOSIS — M25562 Pain in left knee: Secondary | ICD-10-CM

## 2018-03-22 DIAGNOSIS — R29898 Other symptoms and signs involving the musculoskeletal system: Secondary | ICD-10-CM

## 2018-03-22 DIAGNOSIS — G8929 Other chronic pain: Secondary | ICD-10-CM

## 2018-03-22 NOTE — Therapy (Signed)
[facility] [address] [place], [place], [address] Phone: [phone]   Fax:  [phone]  Physical Therapy Treatment and Re-certification  Patient Details  Name: Camera Krienke MRN: [id number] Date of Birth: 20-Nov-1960 Referring Provider (PT): Joni Fears, MD   Encounter Date: 03/22/2018  PT End of Session - 03/22/18 1009    Visit Number  15    Number of Visits  16    Date for PT Re-Evaluation  03/26/18    Authorization Type  [employer] Medicare- Requires 10th visit PN    PT Start Time  0805    PT Stop Time  0845    PT Time Calculation (min)  40 min    Activity Tolerance  Patient tolerated treatment well    Behavior During Therapy  [facility] for tasks assessed/performed       Past Medical History:  Diagnosis Date  . DDD (degenerative disc disease), cervical 03/22/2016  . DDD (degenerative disc disease), lumbar 03/22/2016  . GERD (gastroesophageal reflux disease)   . Hyperlipidemia   . Hypertension   . Rheumatoid arthritis ([place])   . Rheumatoid arthritis(714.0)   . 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 Type II diabetes mellitus ([place])     Past Surgical History:  Procedure Laterality Date  . APPENDECTOMY  1968  . CATARACT EXTRACTION W/ INTRAOCULAR LENS  IMPLANT, BILATERAL Bilateral 11/2013-12/2013  . KNEE ARTHROPLASTY    . KNEE ARTHROSCOPY Left 1980's-1990's X 3  . MYOMECTOMY  1990's    There were no vitals filed for this visit.  Subjective Assessment - 03/22/18 0816    Subjective  No soreness after last session.  Aches this morning due to the rain.  Discussed aquatic appointments and renewal.    Pertinent History  RA, osteoarthritis, degenerative disc disease of the spine, angioedema, DM, chronic use of systemic steroids, HTN, Scleritis, Hypokalemia     Limitations  Sitting;Walking;Lifting;Standing    How long can you sit comfortably?  2 hours before having to get up and move around     How long can you walk  comfortably?  Pt reports ability to walk community distances with slight pain in L knee    Patient Stated Goals  Increase L LE strength; walk as much to delay L knee surgery.     Currently in Pain?  Yes    Pain Location  Generalized    Pain Descriptors / Indicators  Aching         [facility] PT Assessment - 03/22/18 0819      Assessment   Medical Diagnosis  Left knee OA     Referring Provider (PT)  Joni Fears, MD    Onset Date/Surgical Date  01/10/18    Hand Dominance  Right    Next MD Visit  Pt reports next visit is in October to follow up with progress and determine need for surgery.     Prior Therapy  Pt reports prior physical therapy for L knee scope procedures 20 years ago and s/p tearing her L rotator cuff years ago from fishing incident       Precautions   Precautions  Fall      Prior Function   Level of Independence  Independent      Strength   Overall Strength  Deficits    Overall Strength Comments  5/5 knee and ankle bilaterally; 4/5 hip flexion bilaterally    Right Hip ABduction  4+/5    Right Hip ADduction  4+/5    Left  Hip ABduction  4+/5    Left Hip ADduction  4+/5      Ambulation/Gait   Stairs  Yes    Stairs Assistance  6: Modified independent (Device/Increase time)    Stair Management Technique  No rails;Alternating pattern;Forwards    Number of Stairs  12    Height of Stairs  6    Gait Comments  pain before stairs, 4/10, after stairs 5/10      Standardized Balance Assessment   Standardized Balance Assessment  10 meter walk test    10 Meter Walk  9.97 or 3.3 ft/sec      Functional Gait  Assessment   Gait assessed   Yes    Gait Level Surface  Walks 20 ft in less than 7 sec but greater than 5.5 sec, uses assistive device, slower speed, mild gait deviations, or deviates 6-10 in outside of the 12 in walkway width.    Change in Gait Speed  Able to smoothly change walking speed without loss of balance or gait deviation. Deviate no more than 6 in outside of  the 12 in walkway width.    Gait with Horizontal Head Turns  Performs head turns smoothly with no change in gait. Deviates no more than 6 in outside 12 in walkway width    Gait with Vertical Head Turns  Performs head turns with no change in gait. Deviates no more than 6 in outside 12 in walkway width.    Gait and Pivot Turn  Pivot turns safely within 3 sec and stops quickly with no loss of balance.    Step Over Obstacle  Is able to step over 2 stacked shoe boxes taped together (9 in total height) without changing gait speed. No evidence of imbalance.    Gait with Narrow Base of Support  Is able to ambulate for 10 steps heel to toe with no staggering.    Gait with Eyes Closed  Walks 20 ft, slow speed, abnormal gait pattern, evidence for imbalance, deviates 10-15 in outside 12 in walkway width. Requires more than 9 sec to ambulate 20 ft.    Ambulating Backwards  Walks 20 ft, uses assistive device, slower speed, mild gait deviations, deviates 6-10 in outside 12 in walkway width.    Steps  Alternating feet, no rail.    Total Score  26    FGA comment:  26/30                   [facility] Adult PT Treatment/Exercise - 03/22/18 0819      Therapeutic Activites    Therapeutic Activities  Other Therapeutic Activities    Other Therapeutic Activities  reviewed aquatic therapy session on Tuesday, provided with handout of information about what to expect regarding first appointment and address.  Answered pt's questions about aquatic session.  Also discussed plan for renewal and adding visits              PT Education - 03/22/18 1011    Education Details  See TA    Person(s) Educated  Patient    Methods  Explanation    Comprehension  Verbalized understanding       PT Short Term Goals - 02/22/18 1232      PT SHORT TERM GOAL #1   Title  STG=LTG'S        PT Long Term Goals - 03/22/18 0818      PT LONG TERM GOAL #1   Title  Pt will demonstrate independence with land  and aquatic HEP  as well as initiate community wellness program at gym/activity center    Time  4    Period  Weeks    Status  Partially Met      PT LONG TERM GOAL #2   Title  Pt will improve gait speed to >/= 4.00 ft/sec indicative of normal walking speed and improved functional mobility     Baseline  3.33 ft/sec no AD    Time  4    Period  Weeks    Status  Not Met      PT LONG TERM GOAL #4   Title  Pt will perform stair negotiation consisting of 12 steps without rail, alternating sequence rating pain at </= 2/10    Baseline  5/10 in LLE ascending stairs without rail    Time  4    Period  Weeks    Status  Partially Met      PT LONG TERM GOAL #6   Title  Pt will increase LE strength to overall 4+/5 when assessed in sitting to demonstrate improvement in LE strengthening     Baseline  --    Time  4    Period  Weeks    Status  Achieved       New goals for re-certification:  PT Short Term Goals - 03/22/18 1253      PT SHORT TERM GOAL #1   Title  STG=LTG'S      PT Long Term Goals - 03/22/18 1254      PT LONG TERM GOAL #1   Title  Pt will demonstrate independence with land and aquatic HEP as well as initiate community wellness program at gym/activity center    Time  4    Period  Weeks    Status  Revised    Target Date  04/25/18      PT LONG TERM GOAL #2   Title  Pt will improve gait speed to >/= 3.6 ft/sec indicative of normal walking speed and improved functional mobility     Baseline  3.33 ft/sec no AD    Time  4    Period  Weeks    Status  Not Met    Target Date  04/25/18      PT LONG TERM GOAL #3   Title  Pt will improve FGA to >/= 28/30    Time  4    Period  Weeks    Status  New    Target Date  04/25/18      PT LONG TERM GOAL #4   Title  Pt will perform stair negotiation consisting of 12 steps without rail, alternating sequence rating pain at </= 2/10    Baseline  4-5/10 in LLE ascending stairs without rail    Time  4    Period  Weeks    Status  Revised    Target Date   04/25/18           Plan - 03/22/18 1012    Clinical Impression Statement  Completed assessment of LTG.  Pt is making steady progress and has met 1/4 LTG, partially met two goals and did not meet gait velocity goal.  Pt's gait velocity remains unchanged but pt reports her slowed gait is due to ongoing pain.  She also continues to experience pain with stair negotiation.  Performed further gait and balance assessment with FGA with  pt demonstrating slowed gait speed with forwards and retro gait and increased veering with  vision removed,  Pt has not started a wellness plan at a community activity center but is planning on attending 3 aquatic therapy sessions to learn aquatic HEP.  Will renew for 3-4 more weeks for aquatic therapy and continued land therapy to focus on LE strengthening and balance training to decrease falls risk.  Pt agreeable to plan.    Rehab Potential  Good    Clinical Impairments Affecting Rehab Potential   RA, osteoarthritis, degenerative disc disease of the spine, angioedema, DM, chronic use of systemic steroids, HTN, Scleritis, Hypokalemia     PT Frequency  2x / week    PT Duration  4 weeks    PT Treatment/Interventions  ADLs/Self Care Home Management;Gait training;Passive range of motion;Manual techniques;Stair training;Functional mobility training;Therapeutic activities;Patient/family education;Taping;Therapeutic exercise;Electrical Stimulation;Balance training;Moist Heat;Cryotherapy;Iontophoresis 47m/ml Dexamethasone;Aquatic Therapy    PT Next Visit Plan  Aquatic starts Tuesday.  Stair negotiation and closed hip ABD strengthening!   Bioness L quad with strengthening and stairs.  Condense land HEP    PT HTony [id number].  Will need aquatic HEP    Consulted and Agree with Plan of Care  Patient       Patient will benefit from skilled therapeutic intervention in order to improve the following deficits and impairments:  Improper body mechanics, Pain,  Decreased mobility, Decreased range of motion, Decreased strength, Hypomobility, Impaired perceived functional ability, Decreased balance, Decreased safety awareness, Difficulty walking, Increased edema, Impaired flexibility  Visit Diagnosis: Muscle weakness (generalized)  Other symptoms and signs involving the musculoskeletal system  Chronic pain of left knee     Problem List Patient Active Problem List   Diagnosis Date Noted  . Unilateral primary osteoarthritis, left knee 01/10/2018  . Chronic pain of left knee 01/10/2018  . DDD (degenerative disc disease), cervical 03/22/2016  . DDD (degenerative disc disease), lumbar 03/22/2016  . 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 Angioedema 07/15/2014  . Hypokalemia 07/15/2014  . Diabetes mellitus, controlled ([place] 07/03/2012  . HTN (hypertension) 07/03/2012  . [place] 07/03/2012  . Current chronic use of systemic steroids 07/03/2012  . 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

## 2018-03-26 ENCOUNTER — Ambulatory Visit: Payer: Medicare Other | Admitting: Physical Therapy

## 2018-03-26 ENCOUNTER — Encounter: Payer: Self-pay | Admitting: Physical Therapy

## 2018-03-26 DIAGNOSIS — M6281 Muscle weakness (generalized): Secondary | ICD-10-CM

## 2018-03-26 DIAGNOSIS — R29898 Other symptoms and signs involving the musculoskeletal system: Secondary | ICD-10-CM

## 2018-03-26 NOTE — Therapy (Signed)
[facility] [address] [place], [place], [address] Phone: [phone]   Fax:  [phone]  Physical Therapy Treatment  Patient Details  Name: Wendy Woods MRN: [id number] Date of Birth: Jan 26, 1961 Referring Provider (PT): Joni Fears, MD   Encounter Date: 03/26/2018  PT End of Session - 03/26/18 1821    Visit Number  16    Number of Visits  21    Date for PT Re-Evaluation  04/25/18    Authorization Type  [employer] Medicare- Requires 10th visit PN    PT Start Time  1500    PT Stop Time  1545    PT Time Calculation (min)  45 min       Past Medical History:  Diagnosis Date  . DDD (degenerative disc disease), cervical 03/22/2016  . DDD (degenerative disc disease), lumbar 03/22/2016  . GERD (gastroesophageal reflux disease)   . Hyperlipidemia   . Hypertension   . Rheumatoid arthritis ([place])   . Rheumatoid arthritis(714.0)   . 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 Type II diabetes mellitus ([place])     Past Surgical History:  Procedure Laterality Date  . APPENDECTOMY  1968  . CATARACT EXTRACTION W/ INTRAOCULAR LENS  IMPLANT, BILATERAL Bilateral 11/2013-12/2013  . KNEE ARTHROPLASTY    . KNEE ARTHROSCOPY Left 1980's-1990's X 3  . MYOMECTOMY  1990's    There were no vitals filed for this visit.  Subjective Assessment - 03/26/18 1816    Subjective  Pt states she loves the water - is ready to try aquatic therapy; pt ambulating without device    Pertinent History  RA, osteoarthritis, degenerative disc disease of the spine, angioedema, DM, chronic use of systemic steroids, HTN, Scleritis, Hypokalemia     Limitations  Sitting;Walking;Lifting;Standing    Patient Stated Goals  Increase L LE strength; walk as much to delay L knee surgery.     Currently in Pain?  Other (Comment)   Pt reports "usual, generalized pain" due to rheumatoid arthritis    Pain Score  --   pt did not rate pain   Pain Location  Generalized    Pain  Orientation  Other (Comment)   generalized pain all over body   Pain Descriptors / Indicators  Aching    Pain Type  Chronic pain    Pain Onset  More than a month ago    Pain Frequency  Constant       Aquatic therapy;  Pool temp 87.0 degrees  Patient seen for aquatic therapy today.  Treatment took place in water 3.5-4 feet deep depending upon activity.  Pt entered and exited  the pool via step negotiation with supervision for safety with use of hand rails.  Pt performed amb. In 4' water depth for warm up - with use of 1 UE support on pool edge; pt performed forwards, backwards, sideways amb. 30' x 2 reps each  Strengthening exercises - ankle flotation cuff weights used on each leg for increased buoyancy for increased resistance with hip and knee exercises;  Pt performed Hip flexion, extension and abduction 10 reps each leg holding onto pool wall for support; knee flexion RLE and LLE with cuff weight on each leg 10 reps each  Marching in place without UE support 10 reps each leg;  Pt performed amb. Length of pool 100' x 2 reps forward with 1st rep fast, then 2nd rep slower - cues to use arms and increase step length Pt performed sideways amb. With squats 35' x 1:  Backwards amb. 100' x 1 with cues to maintain erect posture  Heel raises x 10 reps Pt performed LE strengthening exercises in supine - use of noodle under arms with PT holding pt for flotation in supine position - pt performed hip abduction 10 reps. Single knee to chest x 10 reps, Double knee to chest x 10 reps:  Hip abduction with scissoring of LE's with adduction 10 reps  Pt performed ballistic movements without UE support - jogging in place 10 reps each leg, cross country skiing motion 10 reps each leg and jumping jacks 10 reps   Balance activity - standing in 4' water depth without UE support - pt performed "clock touches" with 3 numbers called with pt touching each number on imaginary clock 5 reps with each foot for improved   SLS  Pt performed runner's stretch on each leg - 30 sec hold at end of session for hamstring stretching  Amb. 100' "fast pace" across pool using viscosity for resistance; then 100' normal pace; 100' backwards ambulation with cues to stand erect and tighten core for abdominal strengthening  Pt requires buoyancy of water for off loading of joints and to reduce weight bearing which is needed to increase comfort for Lt knee due to OA; pt requires the up thrust of water to work on balance for  improved posture and improved SLS; viscosity of water is needed for resistance for strengthening of bil. LE's                        PT Short Term Goals - 03/22/18 1253      PT SHORT TERM GOAL #1   Title  STG=LTG'S        PT Long Term Goals - 03/22/18 1254      PT LONG TERM GOAL #1   Title  Pt will demonstrate independence with land and aquatic HEP as well as initiate community wellness program at gym/activity center    Time  4    Period  Weeks    Status  Revised    Target Date  04/25/18      PT LONG TERM GOAL #2   Title  Pt will improve gait speed to >/= 3.6 ft/sec indicative of normal walking speed and improved functional mobility     Baseline  3.33 ft/sec no AD    Time  4    Period  Weeks    Status  Not Met    Target Date  04/25/18      PT LONG TERM GOAL #3   Title  Pt will improve FGA to >/= 28/30    Time  4    Period  Weeks    Status  New    Target Date  04/25/18      PT LONG TERM GOAL #4   Title  Pt will perform stair negotiation consisting of 12 steps without rail, alternating sequence rating pain at </= 2/10    Baseline  4-5/10 in LLE ascending stairs without rail    Time  4    Period  Weeks    Status  Revised    Target Date  04/25/18            Plan - 03/26/18 1821    Clinical Impression Statement  Pt tolerated aquatic exercises very well with no pain reported in left knee during session in water;  pt demonstrated good endurance and activity  tolerance, with very few rest breaks needed/requested.  Pt demonstrated decreased SLS on RLE and LLE in water, with use of noodle used to provide some UE support without the stability of the pool wall.  Pt needed cues to engage core muscles during standing LE stregnthening exercises.  Pt demonstrated ability to vary gait speed in water with fast walking and then with normal gait speed ambulation.  Pt stated she felt good at end of session and really liked the pool exercises, stating it felt good to be able to move.      Rehab Potential  Good    PT Frequency  2x / week    PT Duration  4 weeks    PT Next Visit Plan  Stair negotiation and closed hip ABD strengthening!   Bioness L quad with strengthening and stairs.  Condense land HEP    PT [place], [email or web address].  Will need aquatic HEP    Consulted and Agree with Plan of Care  Patient       Patient will benefit from skilled therapeutic intervention in order to improve the following deficits and impairments:  Improper body mechanics, Pain, Decreased mobility, Decreased range of motion, Decreased strength, Hypomobility, Impaired perceived functional ability, Decreased balance, Decreased safety awareness, Difficulty walking, Increased edema, Impaired flexibility  Visit Diagnosis: Muscle weakness (generalized)  Other symptoms and signs involving the musculoskeletal system     Problem List Patient Active Problem List   Diagnosis Date Noted  . Unilateral primary osteoarthritis, left knee 01/10/2018  . Chronic pain of left knee 01/10/2018  . DDD (degenerative disc disease), cervical 03/22/2016  . DDD (degenerative disc disease), lumbar 03/22/2016  . 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 Angioedema 07/15/2014  . Hypokalemia 07/15/2014  . Diabetes mellitus, controlled ([place]) 07/03/2012  . HTN (hypertension) 07/03/2012  . Rheumatoid arthritis ([place]) 07/03/2012  . Current chronic use of systemic steroids 07/03/2012  . 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 [place], [place], [address] Phone: [phone]   Fax:  [phone]  Name: Maleiya Pergola MRN: [id number] Date of Birth: March 11, 1961

## 2018-03-27 ENCOUNTER — Encounter: Payer: Self-pay | Admitting: Physical Therapy

## 2018-03-27 ENCOUNTER — Ambulatory Visit: Payer: Medicare Other | Admitting: Physical Therapy

## 2018-03-27 DIAGNOSIS — M6281 Muscle weakness (generalized): Secondary | ICD-10-CM | POA: Diagnosis not present

## 2018-03-27 DIAGNOSIS — M25562 Pain in left knee: Secondary | ICD-10-CM

## 2018-03-27 DIAGNOSIS — R29898 Other symptoms and signs involving the musculoskeletal system: Secondary | ICD-10-CM

## 2018-03-27 DIAGNOSIS — G8929 Other chronic pain: Secondary | ICD-10-CM

## 2018-03-27 NOTE — Therapy (Signed)
[facility] [address] [place], [place], [address] Phone: [phone]   Fax:  [phone]  Physical Therapy Treatment  Patient Details  Name: Wendy Woods MRN: [id number] Date of Birth: Mar 20, 1961 Referring Provider (PT): Joni Fears, MD   Encounter Date: 03/27/2018  PT End of Session - 03/27/18 1133    Visit Number  17    Number of Visits  21    Date for PT Re-Evaluation  04/25/18    Authorization Type  [employer] Medicare- Requires 10th visit PN    PT Start Time  0802    PT Stop Time  0849    PT Time Calculation (min)  47 min    Activity Tolerance  Patient tolerated treatment well    Behavior During Therapy  [facility] for tasks assessed/performed       Past Medical History:  Diagnosis Date  . DDD (degenerative disc disease), cervical 03/22/2016  . DDD (degenerative disc disease), lumbar 03/22/2016  . GERD (gastroesophageal reflux disease)   . Hyperlipidemia   . Hypertension   . Rheumatoid arthritis ([place])   . Rheumatoid arthritis(714.0)   . 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 Type II diabetes mellitus ([place])     Past Surgical History:  Procedure Laterality Date  . APPENDECTOMY  1968  . CATARACT EXTRACTION W/ INTRAOCULAR LENS  IMPLANT, BILATERAL Bilateral 11/2013-12/2013  . KNEE ARTHROPLASTY    . KNEE ARTHROSCOPY Left 1980's-1990's X 3  . MYOMECTOMY  1990's    There were no vitals filed for this visit.  Subjective Assessment - 03/27/18 0807    Subjective  Loved the aquatic therapy!  A little sore today but not bad.      Pertinent History  RA, osteoarthritis, degenerative disc disease of the spine, angioedema, DM, chronic use of systemic steroids, HTN, Scleritis, Hypokalemia     Limitations  Sitting;Walking;Lifting;Standing    Patient Stated Goals  Increase L LE strength; walk as much to delay L knee surgery.     Currently in Pain?  Yes    Pain Score  1     Pain Location  Knee    Pain Descriptors /  Indicators  Sore    Pain Type  Chronic pain    Pain Onset  More than a month ago       Access Code: [email or web address]  URL: [email or web address]  Date: 03/27/2018  Prepared by: Misty Stanley   Exercises  Standing Single Leg Stance with Counter Support - 2 sets - 10 second hold - 1x daily - 7x weekly  Standing Hip Abduction - 10 reps - 2 sets - 1x daily - 7x weekly  Forward Step Up - 10 reps - 2 sets - 2x daily - 6x weekly  Lateral Step Up with Unilateral Counter Support - 10 reps - 2 sets - 2x daily - 6x weekly  Seated Hamstring Stretch - 2 sets - 60 hold - 1x daily - 7x weekly  Wall Squat Hold with Ball - 10 reps - 3 sets - 1x daily - 7x weekly  Alternating Single Leg Bridge - 10 reps - 2 sets - 1x daily - 7x weekly  Straight Leg Raise with External Rotation - 10 reps - 2 sets - 1x daily - 7x weekly  Prone Quadriceps Stretch with Strap - 2 sets - 30 second hold - 1x daily - 7x weekly  Sidelying Reverse Clamshell with Resistance - 10 reps - 2 sets - 1x daily - 7x weekly  PT Education - 03/27/18 1132    Education Details  condensed and reviewed land HEP    Person(s) Educated  Patient    Methods  Explanation;Demonstration;Handout    Comprehension  Verbalized understanding;Returned demonstration       PT Short Term Goals - 03/22/18 1253      PT SHORT TERM GOAL #1   Title  STG=LTG'S        PT Long Term Goals - 03/22/18 1254      PT LONG TERM GOAL #1   Title  Pt will demonstrate independence with land and aquatic HEP as well as initiate community wellness program at gym/activity center    Time  4    Period  Weeks    Status  Revised    Target Date  04/25/18      PT LONG TERM GOAL #2   Title  Pt will improve gait speed to >/= 3.6 ft/sec indicative of normal walking speed and improved functional mobility     Baseline  3.33 ft/sec no AD    Time  4    Period  Weeks    Status  Not Met    Target Date  04/25/18      PT LONG TERM GOAL #3   Title  Pt will  improve FGA to >/= 28/30    Time  4    Period  Weeks    Status  New    Target Date  04/25/18      PT LONG TERM GOAL #4   Title  Pt will perform stair negotiation consisting of 12 steps without rail, alternating sequence rating pain at </= 2/10    Baseline  4-5/10 in LLE ascending stairs without rail    Time  4    Period  Weeks    Status  Revised    Target Date  04/25/18            Plan - 03/27/18 0847    Clinical Impression Statement  Pt will be receiving aquatic HEP from aquatic therapist.  Treatment session focused on condensing land HEP to most appropriate and beneficial exercises and review of those exercises to continue to focus on LE strengthening, ROM and balance.  Pt tolerated well but was a little sore from yesterday's session.  Will continue to review and revise next session.    Rehab Potential  Good    PT Frequency  2x / week    PT Duration  4 weeks    PT Treatment/Interventions  ADLs/Self Care Home Management;Gait training;Passive range of motion;Manual techniques;Stair training;Functional mobility training;Therapeutic activities;Patient/family education;Taping;Therapeutic exercise;Electrical Stimulation;Balance training;Moist Heat;Cryotherapy;Iontophoresis 14m/ml Dexamethasone;Aquatic Therapy    PT Next Visit Plan  Review final exercises on land HEP (step ups, hip ABD, SLS, IT band stretch); Stair negotiation and closed hip ABD strengthening!   Bioness L quad with strengthening and stairs.      PT Home Exercise Plan  [email or web address].  Will need aquatic HEP    Consulted and Agree with Plan of Care  Patient       Patient will benefit from skilled therapeutic intervention in order to improve the following deficits and impairments:  Improper body mechanics, Pain, Decreased mobility, Decreased range of motion, Decreased strength, Hypomobility, Impaired perceived functional ability, Decreased balance, Decreased safety awareness, Difficulty walking, Increased edema, Impaired  flexibility  Visit Diagnosis: Muscle weakness (generalized)  Other symptoms and signs involving the musculoskeletal system  Chronic pain of left knee     Problem List Patient Active  Problem List   Diagnosis Date Noted  . Unilateral primary osteoarthritis, left knee 01/10/2018  . Chronic pain of left knee 01/10/2018  . DDD (degenerative disc disease), cervical 03/22/2016  . DDD (degenerative disc disease), lumbar 03/22/2016  . 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 Angioedema 07/15/2014  . Hypokalemia 07/15/2014  . Diabetes mellitus, controlled ([place]) 07/03/2012  . HTN (hypertension) 07/03/2012  . Rheumatoid arthritis ([place]) 07/03/2012  . Current chronic use of systemic steroids 07/03/2012  . 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 [place], [place], [address] Phone: [phone]   Fax:  [phone]  Name: Wendy Woods MRN: [id number] Date of Birth: August 16, 1960

## 2018-03-27 NOTE — Patient Instructions (Signed)
Access Code: [email or web address]  URL: [email or web address]  Date: 03/27/2018  Prepared by: Bufford Lope   Exercises  Standing Single Leg Stance with Counter Support - 2 sets - 10 second hold - 1x daily - 7x weekly  Standing Hip Abduction - 10 reps - 2 sets - 1x daily - 7x weekly  Forward Step Up - 10 reps - 2 sets - 2x daily - 6x weekly  Lateral Step Up with Unilateral Counter Support - 10 reps - 2 sets - 2x daily - 6x weekly  Seated Hamstring Stretch - 2 sets - 60 hold - 1x daily - 7x weekly  Wall Squat Hold with Ball - 10 reps - 3 sets - 1x daily - 7x weekly  Alternating Single Leg Bridge - 10 reps - 2 sets - 1x daily - 7x weekly  Straight Leg Raise with External Rotation - 10 reps - 2 sets - 1x daily - 7x weekly  Prone Quadriceps Stretch with Strap - 2 sets - 30 second hold - 1x daily - 7x weekly  Sidelying Reverse Clamshell with Resistance - 10 reps - 2 sets - 1x daily - 7x weekly

## 2018-04-01 ENCOUNTER — Telehealth (INDEPENDENT_AMBULATORY_CARE_PROVIDER_SITE_OTHER): Payer: Self-pay

## 2018-04-01 NOTE — Telephone Encounter (Signed)
Message sent to [facility] desk to schedule.

## 2018-04-01 NOTE — Telephone Encounter (Signed)
Please schedule patient an appointment with Dr. Cleophas Dunker. Thank you.  Patient is approved for Synvisc series, left knee. Buy & Bill Patient will be responsible for 20% OOP. No Co-pay No PA required

## 2018-04-02 ENCOUNTER — Ambulatory Visit: Payer: Medicare Other | Attending: Orthopaedic Surgery | Admitting: Physical Therapy

## 2018-04-02 DIAGNOSIS — R29898 Other symptoms and signs involving the musculoskeletal system: Secondary | ICD-10-CM | POA: Insufficient documentation

## 2018-04-02 DIAGNOSIS — G8929 Other chronic pain: Secondary | ICD-10-CM

## 2018-04-02 DIAGNOSIS — M6281 Muscle weakness (generalized): Secondary | ICD-10-CM | POA: Diagnosis present

## 2018-04-02 DIAGNOSIS — M25562 Pain in left knee: Secondary | ICD-10-CM | POA: Diagnosis present

## 2018-04-03 NOTE — Therapy (Signed)
[facility] [address] [place], [place], [address] Phone: [phone]   Fax:  [phone]  Physical Therapy Treatment  Patient Details  Name: Wendy Woods MRN: [id number] Date of Birth: 04-26-1961 Referring Provider (PT): Joni Fears, MD   Encounter Date: 04/02/2018  PT End of Session - 04/03/18 0938    Visit Number  18    Number of Visits  21    Date for PT Re-Evaluation  04/25/18    Authorization Type  [employer] Medicare- Requires 10th visit PN    PT Start Time  1415    PT Stop Time  1500    PT Time Calculation (min)  45 min    Equipment Utilized During Treatment  Other (comment)   flotation belt used for some of the exercises      Past Medical History:  Diagnosis Date  . DDD (degenerative disc disease), cervical 03/22/2016  . DDD (degenerative disc disease), lumbar 03/22/2016  . GERD (gastroesophageal reflux disease)   . Hyperlipidemia   . Hypertension   . Rheumatoid arthritis ([place])   . Rheumatoid arthritis(714.0)   . 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 Type II diabetes mellitus ([place])     Past Surgical History:  Procedure Laterality Date  . APPENDECTOMY  1968  . CATARACT EXTRACTION W/ INTRAOCULAR LENS  IMPLANT, BILATERAL Bilateral 11/2013-12/2013  . KNEE ARTHROPLASTY    . KNEE ARTHROSCOPY Left 1980's-1990's X 3  . MYOMECTOMY  1990's    There were no vitals filed for this visit.  Subjective Assessment - 04/03/18 0934    Subjective  Pt states she was tired after 1st aquatic therapy session last week - "felt good though"    Pertinent History  RA, osteoarthritis, degenerative disc disease of the spine, angioedema, DM, chronic use of systemic steroids, HTN, Scleritis, Hypokalemia     Limitations  Sitting;Walking;Lifting;Standing    How long can you sit comfortably?  2 hours before having to get up and move around     How long can you walk comfortably?  Pt reports ability to walk community distances with  slight pain in L knee    Patient Stated Goals  Increase L LE strength; walk as much to delay L knee surgery.     Currently in Pain?  Yes    Pain Score  1     Pain Location  Knee    Pain Orientation  Left    Pain Descriptors / Indicators  Sore    Pain Type  Chronic pain    Pain Onset  More than a month ago       Aquatic therapy - pool temp. 87.2 degrees   Patient seen for aquatic therapy today.  Treatment took place in water 3.5- 4 feet deep depending upon activity.  Pt entered and Exited the pool via step negotiation with use of hand rails modified independently.  Pt performed water walking entire length of pool (increased space available due to no one else using pool during this treatment time)  Forwards, backwards and sideways 1 length (100') each direction Marching forwards and backwards 100' each direction with cues for big movement for increased resistance of water - SBA for safety   Pt performed resisted amb. With moderate perturbations using resistance tubing 100' forward and then 100' backward with SBA for safety Marching in place 10 reps each with cues for big movement, progressing to marching forward and backward 100' each direction  Pt performed strengthening exercises - ankle flotation cuff weights  used on each leg for increased buoyancy for increased resistance with hip and knee exercises;  Pt performed Hip flexion, extension and abduction 10 reps each leg holding onto pool wall for support; knee flexion RLE and LLE with cuff weight on each leg 10 reps each  Pt performed LE strengthening exercises in supine - use of noodle under arms with PT holding pt for flotation in supine position - pt performed hip abduction 10 reps. Single knee to chest x 10 reps, Heel raises x 10 reps; squats x 10 reps  Runner's stretch for bil. Hamstring and heel cord stretching 30 sec hold x 2 reps each  Jogging in place 20 reps each leg - buoyancy of water provided support for balance and reduced  joint loading for pt to perform without excess stress and pain   Ai Chi postures performed (3 postures) to facilitate improved balance with weight shifting and turning and to increase core strength for improved core stabilization   Pt requires buoyancy of water for to reduce weight bearing which is needed to increase comfort for Lt knee due to OA; pt requires the up thrust of water to work on balance for  improved posture and improved SLS; viscosity of water is needed for resistance for strengthening of bil. LE's                       PT Short Term Goals - 03/22/18 1253      PT SHORT TERM GOAL #1   Title  STG=LTG'S        PT Long Term Goals - 03/22/18 1254      PT LONG TERM GOAL #1   Title  Pt will demonstrate independence with land and aquatic HEP as well as initiate community wellness program at gym/activity center    Time  4    Period  Weeks    Status  Revised    Target Date  04/25/18      PT LONG TERM GOAL #2   Title  Pt will improve gait speed to >/= 3.6 ft/sec indicative of normal walking speed and improved functional mobility     Baseline  3.33 ft/sec no AD    Time  4    Period  Weeks    Status  Not Met    Target Date  04/25/18      PT LONG TERM GOAL #3   Title  Pt will improve FGA to >/= 28/30    Time  4    Period  Weeks    Status  New    Target Date  04/25/18      PT LONG TERM GOAL #4   Title  Pt will perform stair negotiation consisting of 12 steps without rail, alternating sequence rating pain at </= 2/10    Baseline  4-5/10 in LLE ascending stairs without rail    Time  4    Period  Weeks    Status  Revised    Target Date  04/25/18            Plan - 04/03/18 0939    Clinical Impression Statement  Pt tolerating aquatic exercise very well with pt amb. full length of pool today (100') (pool not crowded as increased space alloted for treatment). Pt demonstrating improved balance & strength as able to perform resisted ambulation full  length of pool with moderate perturbations.  Pt able to jog in water approx. 2' with no c/o pain.  Pt  progressing well towards goals with increased activity tolerance with minimal to no pain reported in water.       Rehab Potential  Good    Clinical Impairments Affecting Rehab Potential   RA, osteoarthritis, degenerative disc disease of the spine, angioedema, DM, chronic use of systemic steroids, HTN, Scleritis, Hypokalemia     PT Frequency  2x / week    PT Duration  4 weeks    PT Treatment/Interventions  ADLs/Self Care Home Management;Gait training;Passive range of motion;Manual techniques;Stair training;Functional mobility training;Therapeutic activities;Patient/family education;Taping;Therapeutic exercise;Electrical Stimulation;Balance training;Moist Heat;Cryotherapy;Iontophoresis 71m/ml Dexamethasone;Aquatic Therapy    PT Next Visit Plan  Review final exercises on land HEP (step ups, hip ABD, SLS, IT band stretch); Stair negotiation and closed hip ABD strengthening!   Bioness L quad with strengthening and stairs.      PT Home Exercise Plan  [email or web address].  Will need aquatic HEP    Consulted and Agree with Plan of Care  Patient       Patient will benefit from skilled therapeutic intervention in order to improve the following deficits and impairments:  Improper body mechanics, Pain, Decreased mobility, Decreased range of motion, Decreased strength, Hypomobility, Impaired perceived functional ability, Decreased balance, Decreased safety awareness, Difficulty walking, Increased edema, Impaired flexibility  Visit Diagnosis: Muscle weakness (generalized)  Chronic pain of left knee     Problem List Patient Active Problem List   Diagnosis Date Noted  . Unilateral primary osteoarthritis, left knee 01/10/2018  . Chronic pain of left knee 01/10/2018  . DDD (degenerative disc disease), cervical 03/22/2016  . DDD (degenerative disc disease), lumbar 03/22/2016  . 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 Angioedema  07/15/2014  . Hypokalemia 07/15/2014  . Diabetes mellitus, controlled ([place] 07/03/2012  . HTN (hypertension) 07/03/2012  . Rheumatoid arthritis ([place] 07/03/2012  . Current chronic use of systemic steroids 07/03/2012  . 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

## 2018-04-05 ENCOUNTER — Encounter: Payer: Self-pay | Admitting: Physical Therapy

## 2018-04-05 ENCOUNTER — Ambulatory Visit: Payer: Medicare Other | Admitting: Physical Therapy

## 2018-04-05 DIAGNOSIS — G8929 Other chronic pain: Secondary | ICD-10-CM

## 2018-04-05 DIAGNOSIS — M6281 Muscle weakness (generalized): Secondary | ICD-10-CM | POA: Diagnosis not present

## 2018-04-05 DIAGNOSIS — R29898 Other symptoms and signs involving the musculoskeletal system: Secondary | ICD-10-CM

## 2018-04-05 DIAGNOSIS — M25562 Pain in left knee: Secondary | ICD-10-CM

## 2018-04-05 NOTE — Therapy (Signed)
[facility] [address] [place], [place], [address] Phone: [phone]   Fax:  [phone]  Physical Therapy Treatment  Patient Details  Name: Wendy Woods MRN: [id number] Date of Birth: 1960-05-14 Referring Provider (PT): Joni Fears, MD   Encounter Date: 04/05/2018  PT End of Session - 04/05/18 0808    Visit Number  19    Number of Visits  21    Date for PT Re-Evaluation  04/25/18    Authorization Type  [employer]- Requires 10th visit PN    PT Start Time  0803    PT Stop Time  0842    PT Time Calculation (min)  39 min    Activity Tolerance  Patient tolerated treatment well    Behavior During Therapy  [facility] for tasks assessed/performed       Past Medical History:  Diagnosis Date  . DDD (degenerative disc disease), cervical 03/22/2016  . DDD (degenerative disc disease), lumbar 03/22/2016  . GERD (gastroesophageal reflux disease)   . Hyperlipidemia   . Hypertension   . Rheumatoid arthritis ([place])   . Rheumatoid arthritis(714.0)   . 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 Type II diabetes mellitus ([place])     Past Surgical History:  Procedure Laterality Date  . APPENDECTOMY  1968  . CATARACT EXTRACTION W/ INTRAOCULAR LENS  IMPLANT, BILATERAL Bilateral 11/2013-12/2013  . KNEE ARTHROPLASTY    . KNEE ARTHROSCOPY Left 1980's-1990's X 3  . MYOMECTOMY  1990's    There were no vitals filed for this visit.  Subjective Assessment - 04/05/18 0805    Subjective  really enjoys aquatic therapy - feels as if she is getting stronger    Pertinent History  RA, osteoarthritis, degenerative disc disease of the spine, angioedema, DM, chronic use of systemic steroids, HTN, Scleritis, Hypokalemia     Limitations  Sitting;Walking;Lifting;Standing    How long can you sit comfortably?  2 hours before having to get up and move around     How long can you walk comfortably?  Pt reports ability to walk community distances with slight  pain in L knee    Patient Stated Goals  Increase L LE strength; walk as much to delay L knee surgery.     Currently in Pain?  Yes    Pain Score  1     Pain Location  Knee    Pain Orientation  Left    Pain Descriptors / Indicators  Sore    Pain Type  Chronic pain    Pain Onset  More than a month ago                       [facility] Adult PT Treatment/Exercise - 04/05/18 0814      Exercises   Exercises  Knee/Hip      Knee/Hip Exercises: Aerobic   Tread Mill  1.3 mph x 6 min for ddynamic warm up      Knee/Hip Exercises: Standing   Forward Lunges  Both;15 reps    Forward Lunges Limitations  mini - front LE on BOSU (up)    Lateral Step Up  Left;1 set;15 reps;Hand Hold: 1;Hand Hold: 0;Step Height: 6"   + AirEx   Forward Step Up  Left;1 set;15 reps;Hand Hold: 1;Step Height: 6"   + AirEx   Step Down  Left;4 sets;5 reps;Hand Hold: 2;Step Height: 6"    Step Down Limitations  requires rest breaks    Functional Squat  15 reps  Functional Squat Limitations  on BOSU (up)    SLS  L SLS with R ball roll 2 x 1 min    Other Standing Knee Exercises  standing hip drop focusing on glut med activation x 15 reps    Other Standing Knee Exercises  standing on AirEx - R toe taps to yoga block on 6" step; L toe taps                PT Short Term Goals - 03/22/18 1253      PT SHORT TERM GOAL #1   Title  STG=LTG'S        PT Long Term Goals - 03/22/18 1254      PT LONG TERM GOAL #1   Title  Pt will demonstrate independence with land and aquatic HEP as well as initiate community wellness program at gym/activity center    Time  4    Period  Weeks    Status  Revised    Target Date  04/25/18      PT LONG TERM GOAL #2   Title  Pt will improve gait speed to >/= 3.6 ft/sec indicative of normal walking speed and improved functional mobility     Baseline  3.33 ft/sec no AD    Time  4    Period  Weeks    Status  Not Met    Target Date  04/25/18      PT LONG TERM GOAL #3    Title  Pt will improve FGA to >/= 28/30    Time  4    Period  Weeks    Status  New    Target Date  04/25/18      PT LONG TERM GOAL #4   Title  Pt will perform stair negotiation consisting of 12 steps without rail, alternating sequence rating pain at </= 2/10    Baseline  4-5/10 in LLE ascending stairs without rail    Time  4    Period  Weeks    Status  Revised    Target Date  04/25/18            Plan - 04/05/18 0810    Clinical Impression Statement  Wendy Woods doing well - session today focusing largely on LE strengthening with good tolerance. Patient does requires frequent rest breaks due to muscle fatigue and soreness. Discussion to speak to therapist at next visit regarding best place for community wellness at completion of POC.     Rehab Potential  Good    Clinical Impairments Affecting Rehab Potential   RA, osteoarthritis, degenerative disc disease of the spine, angioedema, DM, chronic use of systemic steroids, HTN, Scleritis, Hypokalemia     PT Frequency  2x / week    PT Duration  4 weeks    PT Treatment/Interventions  ADLs/Self Care Home Management;Gait training;Passive range of motion;Manual techniques;Stair training;Functional mobility training;Therapeutic activities;Patient/family education;Taping;Therapeutic exercise;Electrical Stimulation;Balance training;Moist Heat;Cryotherapy;Iontophoresis 51m/ml Dexamethasone;Aquatic Therapy    PT Next Visit Plan  Review final exercises on land HEP (step ups, hip ABD, SLS, IT band stretch); Stair negotiation and closed hip ABD strengthening!   Bioness L quad with strengthening and stairs.      PT Home Exercise Plan  [id number].  Will need aquatic HEP    Consulted and Agree with Plan of Care  Patient       Patient will benefit from skilled therapeutic intervention in order to improve the following deficits and impairments:  Improper body mechanics, Pain, Decreased  mobility, Decreased range of motion, Decreased strength, Hypomobility,  Impaired perceived functional ability, Decreased balance, Decreased safety awareness, Difficulty walking, Increased edema, Impaired flexibility  Visit Diagnosis: Muscle weakness (generalized)  Chronic pain of left knee  Other symptoms and signs involving the musculoskeletal system     Problem List Patient Active Problem List   Diagnosis Date Noted  . Unilateral primary osteoarthritis, left knee 01/10/2018  . Chronic pain of left knee 01/10/2018  . DDD (degenerative disc disease), cervical 03/22/2016  . DDD (degenerative disc disease), lumbar 03/22/2016  . 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 Angioedema 07/15/2014  . Hypokalemia 07/15/2014  . Diabetes mellitus, controlled ([place]) 07/03/2012  . HTN (hypertension) 07/03/2012  . Rheumatoid arthritis ([place]) 07/03/2012  . Current chronic use of systemic steroids 07/03/2012  . 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 [place], [place], [address] Phone: [phone]   Fax:  [phone]  Name: Wendy Woods MRN: [id number] Date of Birth: 04/24/1961

## 2018-04-09 ENCOUNTER — Ambulatory Visit: Payer: Medicare Other | Admitting: Physical Therapy

## 2018-04-09 DIAGNOSIS — M6281 Muscle weakness (generalized): Secondary | ICD-10-CM

## 2018-04-09 DIAGNOSIS — R29898 Other symptoms and signs involving the musculoskeletal system: Secondary | ICD-10-CM

## 2018-04-10 NOTE — Therapy (Signed)
[facility] [address] [place], [place], [address] Phone: [phone]   Fax:  [phone]  Physical Therapy Treatment  Patient Details  Name: Wendy Woods MRN: [id number] Date of Birth: 1960/11/16 Referring Provider (PT): Joni Fears, MD   Encounter Date: 04/09/2018  PT End of Session - 04/10/18 2129    Visit Number  20    Number of Visits  21    Date for PT Re-Evaluation  04/25/18    Authorization Type  [employer] 10th visit PN    PT Start Time  1320   started 10" early   PT Stop Time  1415    PT Time Calculation (min)  55 min    Equipment Utilized During Treatment  Other (comment)   flotation belt and ankle cuff weights   Activity Tolerance  Patient tolerated treatment well    Behavior During Therapy  [facility] for tasks assessed/performed       Past Medical History:  Diagnosis Date  . DDD (degenerative disc disease), cervical 03/22/2016  . DDD (degenerative disc disease), lumbar 03/22/2016  . GERD (gastroesophageal reflux disease)   . Hyperlipidemia   . Hypertension   . Rheumatoid arthritis ([place])   . Rheumatoid arthritis(714.0)   . 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 Type II diabetes mellitus ([place])     Past Surgical History:  Procedure Laterality Date  . APPENDECTOMY  1968  . CATARACT EXTRACTION W/ INTRAOCULAR LENS  IMPLANT, BILATERAL Bilateral 11/2013-12/2013  . KNEE ARTHROPLASTY    . KNEE ARTHROSCOPY Left 1980's-1990's X 3  . MYOMECTOMY  1990's    There were no vitals filed for this visit.  Subjective Assessment - 04/10/18 2128    Subjective  Pt presents for aquatic therapy at [facility]: Pt states she feels good today - states usually she is very sore and still with rainy weather but not so much today    Patient Stated Goals  Increase L LE strength; walk as much to delay L knee surgery.     Currently in Pain?  No/denies       Aquatic therapy - pool temp 87.0 degrees  Patient seen  for aquatic therapy today.  Treatment took place in water 3.5-4 feet deep depending upon activity.  Pt entered & exited the pool via Step negotiation with use of hand rails.    Strengthening exercises - ankle flotation cuff weights used on each leg for increased buoyancy for increased resistance with hip and knee exercises;  Pt performed Hip flexion, extension and abduction 10 reps each leg holding onto pool wall for support; knee flexion RLE and LLE with cuff weight on each leg 10 reps each; hip abduction/adduction with knee flexed at 90 degrees - 10 reps each leg;  Knee flexion 10 reps each leg using cuff weight for increased buoyancy with flexion and incr. resistance with extension  Marching in place without UE support 10 reps each leg;  Pt performed amb. Length of pool 100' x 2 reps forward with 1st rep fast, then 2nd rep slower - cues to use arms and increase step length Pt performed sideways amb. With squats 35' x 1: Backwards amb. 100' x 1 with cues to maintain erect posture Pt performed trunk stabilization exercises - attempting to stay afloat with use of bar bells in hands- arms extended by her side - attempting to maintain balance with feet off pool floor; pt sat on noodle  With mod assist - to work on core stabilization and trunk  control   Sidestepping with squats 100' across pool  Heel raises x 10 reps Pt performed ballistic movements without UE support - jogging in place 10 reps each leg; progressed to jogging 100' across pool 2 reps (rest break between reps); fast walking 100' x  2 reps   Pt performed runner's stretch on each leg - 30 sec hold at end of session for hamstring stretching  Amb. 100' "fast pace" across pool using viscosity for resistance; 100' backwards ambulation with cues to stand erect and tighten core for abdominal strengthening  Pt requires buoyancy of water for off loading of joints and to reduce weight bearing which is needed to increase comfort for Lt knee due  to OA; pt requires the up thrust of water to work on balance for  improved posture and improved SLS; viscosity of water is needed for resistance for strengthening of bil. LE's                        PT Short Term Goals - 03/22/18 1253      PT SHORT TERM GOAL #1   Title  STG=LTG'S        PT Long Term Goals - 04/10/18 2136      PT LONG TERM GOAL #1   Title  Pt will demonstrate independence with land and aquatic HEP as well as initiate community wellness program at gym/activity center    Status  Revised      PT LONG TERM GOAL #2   Title  Pt will improve gait speed to >/= 3.6 ft/sec indicative of normal walking speed and improved functional mobility     Baseline  3.33 ft/sec no AD    Status  Not Met      PT LONG TERM GOAL #3   Title  Pt will improve FGA to >/= 28/30    Status  New      PT LONG TERM GOAL #4   Title  Pt will perform stair negotiation consisting of 12 steps without rail, alternating sequence rating pain at </= 2/10    Baseline  4-5/10 in LLE ascending stairs without rail    Status  Revised      PT LONG TERM GOAL #5   Title  Pt will increase L knee flexion AROM by 5-8 degrees to demonstrate improvements in flexibility     Baseline  125 deg L knee flexion    Status  Achieved      PT LONG TERM GOAL #6   Title  Pt will increase LE strength to overall 4+/5 when assessed in sitting to demonstrate improvement in LE strengthening     Status  Achieved            Plan - 04/10/18 2132    Clinical Impression Statement  This 10th visit progress note covers dates 03-05-18 - 04-10-18;  pt is progressing towards goals and is demonstrating increased activity tolerance.  Pt has met LTG's #5 and 6 with other goals having been revised from initial eval;  Pt tolerating aquatic exercise well with minimal rest breaks needed during aquatic session; pt able to jog 100' across pool with short rest break needed after this activity.  Pt had some difficulty initially  with trunk stabilization activity but improved after practice and repetition, as pt determined not to give up on this exercise.  Pt performed approx. 1 hour exercise in pool with minimal c/o fatigue at end of session.    Rehab  Potential  Good    Clinical Impairments Affecting Rehab Potential   RA, osteoarthritis, degenerative disc disease of the spine, angioedema, DM, chronic use of systemic steroids, HTN, Scleritis, Hypokalemia     PT Frequency  2x / week    PT Duration  4 weeks    PT Next Visit Plan  Review final exercises on land HEP (step ups, hip ABD, SLS, IT band stretch); Stair negotiation and closed hip ABD strengthening!   Bioness L quad with strengthening and stairs.      PT Home Exercise Plan  [id number].  Will need aquatic HEP    Consulted and Agree with Plan of Care  Patient       Patient will benefit from skilled therapeutic intervention in order to improve the following deficits and impairments:  Improper body mechanics, Pain, Decreased mobility, Decreased range of motion, Decreased strength, Hypomobility, Impaired perceived functional ability, Decreased balance, Decreased safety awareness, Difficulty walking, Increased edema, Impaired flexibility  Visit Diagnosis: Muscle weakness (generalized)  Other symptoms and signs involving the musculoskeletal system     Problem List Patient Active Problem List   Diagnosis Date Noted  . Unilateral primary osteoarthritis, left knee 01/10/2018  . Chronic pain of left knee 01/10/2018  . DDD (degenerative disc disease), cervical 03/22/2016  . DDD (degenerative disc disease), lumbar 03/22/2016  . 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 Angioedema 07/15/2014  . Hypokalemia 07/15/2014  . Diabetes mellitus, controlled ([place]) 07/03/2012  . HTN (hypertension) 07/03/2012  . Rheumatoid arthritis ([place]) 07/03/2012  . Current chronic use of systemic steroids 07/03/2012  . 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

## 2018-04-10 NOTE — Telephone Encounter (Signed)
LMOM for patient to call and schedule Synvisc injections °

## 2018-04-11 ENCOUNTER — Ambulatory Visit: Payer: Medicare Other | Admitting: Physical Therapy

## 2018-04-11 ENCOUNTER — Encounter: Payer: Self-pay | Admitting: Physical Therapy

## 2018-04-11 DIAGNOSIS — G8929 Other chronic pain: Secondary | ICD-10-CM

## 2018-04-11 DIAGNOSIS — R29898 Other symptoms and signs involving the musculoskeletal system: Secondary | ICD-10-CM

## 2018-04-11 DIAGNOSIS — M6281 Muscle weakness (generalized): Secondary | ICD-10-CM

## 2018-04-11 DIAGNOSIS — M25562 Pain in left knee: Secondary | ICD-10-CM

## 2018-04-11 NOTE — Therapy (Signed)
[facility] [address] [place], [place], [address] Phone: [phone]   Fax:  [phone]  Physical Therapy Treatment  Patient Details  Name: Wendy Woods MRN: [id number] Date of Birth: 1961/01/14 Referring Provider (PT): Joni Fears, MD   Encounter Date: 04/11/2018  PT End of Session - 04/11/18 1117    Visit Number  21    Number of Visits  23   visit number updated; miscounted prior   Date for PT Re-Evaluation  04/25/18    Authorization Type  [employer]- Requires 10th visit PN    PT Start Time  0800    PT Stop Time  0840    PT Time Calculation (min)  40 min    Equipment Utilized During Treatment  Other (comment)   flotation belt and ankle cuff weights   Activity Tolerance  Patient tolerated treatment well    Behavior During Therapy  [facility] for tasks assessed/performed       Past Medical History:  Diagnosis Date  . DDD (degenerative disc disease), cervical 03/22/2016  . DDD (degenerative disc disease), lumbar 03/22/2016  . GERD (gastroesophageal reflux disease)   . Hyperlipidemia   . Hypertension   . Rheumatoid arthritis ([place])   . Rheumatoid arthritis(714.0)   . 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 Type II diabetes mellitus ([place])     Past Surgical History:  Procedure Laterality Date  . APPENDECTOMY  1968  . CATARACT EXTRACTION W/ INTRAOCULAR LENS  IMPLANT, BILATERAL Bilateral 11/2013-12/2013  . KNEE ARTHROPLASTY    . KNEE ARTHROSCOPY Left 1980's-1990's X 3  . MYOMECTOMY  1990's    There were no vitals filed for this visit.  Subjective Assessment - 04/11/18 0810    Subjective  Pt has been doing really well and has really benefited from aquatic.  Would like to go for one more session before D/C to review HEP.  Slight knee pain today due to the cold    Pertinent History  RA, osteoarthritis, degenerative disc disease of the spine, angioedema, DM, chronic use of systemic steroids, HTN, Scleritis,  Hypokalemia     Limitations  Sitting;Walking;Lifting;Standing    How long can you sit comfortably?  2 hours before having to get up and move around     How long can you walk comfortably?  Pt reports ability to walk community distances with slight pain in L knee    Patient Stated Goals  Increase L LE strength; walk as much to delay L knee surgery.     Currently in Pain?  Yes         [facility] PT Assessment - 04/11/18 0814      Standardized Balance Assessment   Standardized Balance Assessment  10 meter walk test    10 Meter Walk  9.31 seconds or 3.52 ft/sec      Functional Gait  Assessment   Gait assessed   Yes    Gait Level Surface  Walks 20 ft in less than 5.5 sec, no assistive devices, good speed, no evidence for imbalance, normal gait pattern, deviates no more than 6 in outside of the 12 in walkway width.    Change in Gait Speed  Able to smoothly change walking speed without loss of balance or gait deviation. Deviate no more than 6 in outside of the 12 in walkway width.    Gait with Horizontal Head Turns  Performs head turns smoothly with no change in gait. Deviates no more than 6 in outside 12 in walkway width  Gait with Vertical Head Turns  Performs head turns with no change in gait. Deviates no more than 6 in outside 12 in walkway width.    Gait and Pivot Turn  Pivot turns safely within 3 sec and stops quickly with no loss of balance.    Step Over Obstacle  Is able to step over 2 stacked shoe boxes taped together (9 in total height) without changing gait speed. No evidence of imbalance.    Gait with Narrow Base of Support  Is able to ambulate for 10 steps heel to toe with no staggering.    Gait with Eyes Closed  Walks 20 ft, slow speed, abnormal gait pattern, evidence for imbalance, deviates 10-15 in outside 12 in walkway width. Requires more than 9 sec to ambulate 20 ft.    Ambulating Backwards  Walks 20 ft, no assistive devices, good speed, no evidence for imbalance, normal gait     Steps  Alternating feet, no rail.    Total Score  28    FGA comment:  28/30                   [facility] Adult PT Treatment/Exercise - 04/11/18 0830      Ambulation/Gait   Stairs  Yes    Stairs Assistance  6: Modified independent (Device/Increase time)    Stair Management Technique  No rails;Alternating pattern;Forwards;Sideways    Number of Stairs  12    Height of Stairs  6    Gait Comments  on days when OA pain is not active pt is able to do stairs without pain.  Today when OA is more active demonstrated to pt how to descend laterally with pt reporting significant decrease in pain and tension in lateral L knee.      Knee/Hip Exercises: Stretches   Other Knee/Hip Stretches  Demonstrated to pt how to roll out lateral quad and IT band and adding fascia rolling to stretches             PT Education - 04/11/18 1116    Education Details  progress towards goals, plan for final 2 visits - will attempt to get one more aquatic visit to review aquatic HEP one more time; role of PT and role of patient at D/C to maintain wellness and options for continuing wellness and exercise    Person(s) Educated  Patient    Methods  Explanation    Comprehension  Verbalized understanding       PT Short Term Goals - 03/22/18 1253      PT SHORT TERM GOAL #1   Title  STG=LTG'S        PT Long Term Goals - 04/11/18 0813      PT LONG TERM GOAL #1   Title  Pt will demonstrate independence with land and aquatic HEP as well as initiate community wellness program at gym/activity center    Status  On-going      PT LONG TERM GOAL #2   Title  Pt will improve gait speed to >/= 3.6 ft/sec indicative of normal walking speed and improved functional mobility     Baseline  3.5 ft/sec    Status  Partially Met      PT LONG TERM GOAL #3   Title  Pt will improve FGA to >/= 28/30    Baseline  28/30    Status  Achieved      PT LONG TERM GOAL #4   Title  Pt will perform stair negotiation  consisting of  12 steps without rail, alternating sequence rating pain at </= 2/10    Baseline  0/10 when weather is warmer; demonstrated alternate method to pt on days when OA is more active (sideways) to decrease strain on L knee resulting in decreased pain    Status  Achieved      PT LONG TERM GOAL #5   Title  Pt will increase L knee flexion AROM by 5-8 degrees to demonstrate improvements in flexibility     Baseline  125 deg L knee flexion    Status  Achieved      PT LONG TERM GOAL #6   Title  Pt will increase LE strength to overall 4+/5 when assessed in sitting to demonstrate improvement in LE strengthening     Status  Achieved            Plan - 04/11/18 1118    Clinical Impression Statement  Treatment session focused on beginning assessment of progress towards LTG following completion of aquatic therapy; pt has requested one more aquatic visit to ensure independence with HEP - will discuss with aquatic therapist and will plan to have one aquatic visit and one land visit next week prior to D/C or 2 land appointments.  Pt agreeable to initiate wellness/exercise at community center at D/C for maintenance.  Continued to provide education for stair negotiation to decrease strain and pain in LLE and use of foam rolling and stretches to relieve tension on lateral knee.  Will continue to review HEP over next two sessions.    Rehab Potential  Good    Clinical Impairments Affecting Rehab Potential   RA, osteoarthritis, degenerative disc disease of the spine, angioedema, DM, chronic use of systemic steroids, HTN, Scleritis, Hypokalemia     PT Frequency  2x / week    PT Duration  4 weeks    PT Treatment/Interventions  ADLs/Self Care Home Management;Gait training;Passive range of motion;Manual techniques;Stair training;Functional mobility training;Therapeutic activities;Patient/family education;Taping;Therapeutic exercise;Electrical Stimulation;Balance training;Moist Heat;Cryotherapy;Iontophoresis 64m/ml  Dexamethasone;Aquatic Therapy    PT Next Visit Plan  Review final exercises on land HEP (step ups, hip ABD, SLS, IT band stretch) and aquatic if visits open up.    PT Home Exercise Plan  [id number].  Will need aquatic HEP    Consulted and Agree with Plan of Care  Patient       Patient will benefit from skilled therapeutic intervention in order to improve the following deficits and impairments:  Improper body mechanics, Pain, Decreased mobility, Decreased range of motion, Decreased strength, Hypomobility, Impaired perceived functional ability, Decreased balance, Decreased safety awareness, Difficulty walking, Increased edema, Impaired flexibility  Visit Diagnosis: Muscle weakness (generalized)  Other symptoms and signs involving the musculoskeletal system  Chronic pain of left knee     Problem List Patient Active Problem List   Diagnosis Date Noted  . Unilateral primary osteoarthritis, left knee 01/10/2018  . Chronic pain of left knee 01/10/2018  . DDD (degenerative disc disease), cervical 03/22/2016  . DDD (degenerative disc disease), lumbar 03/22/2016  . 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 Angioedema 07/15/2014  . Hypokalemia 07/15/2014  . Diabetes mellitus, controlled ([place] 07/03/2012  . HTN (hypertension) 07/03/2012  . Rheumatoid arthritis ([place] 07/03/2012  . Current chronic use of systemic steroids 07/03/2012  . 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

## 2018-04-15 ENCOUNTER — Ambulatory Visit: Payer: Medicare Other | Admitting: Physical Therapy

## 2018-04-15 DIAGNOSIS — G8929 Other chronic pain: Secondary | ICD-10-CM

## 2018-04-15 DIAGNOSIS — R29898 Other symptoms and signs involving the musculoskeletal system: Secondary | ICD-10-CM

## 2018-04-15 DIAGNOSIS — M6281 Muscle weakness (generalized): Secondary | ICD-10-CM | POA: Diagnosis not present

## 2018-04-15 DIAGNOSIS — M25562 Pain in left knee: Secondary | ICD-10-CM

## 2018-04-15 NOTE — Therapy (Signed)
[facility] [address] [place], [place], [address] Phone: [phone]   Fax:  [phone]  Physical Therapy Treatment  Patient Details  Name: Wendy Woods MRN: [id number] Date of Birth: [birth year] Referring Provider (PT): Joni Fears, MD   Encounter Date: 04/15/2018  PT End of Session - 04/15/18 0853    Visit Number  22    Number of Visits  23    Date for PT Re-Evaluation  04/25/18    Authorization Type  [employer] Medicare- Requires 10th visit PN    PT Start Time  0802    PT Stop Time  0843    PT Time Calculation (min)  41 min    Activity Tolerance  Patient tolerated treatment well    Behavior During Therapy  [facility] for tasks assessed/performed       Past Medical History:  Diagnosis Date  . DDD (degenerative disc disease), cervical 03/22/2016  . DDD (degenerative disc disease), lumbar 03/22/2016  . GERD (gastroesophageal reflux disease)   . Hyperlipidemia   . Hypertension   . Rheumatoid arthritis ([place])   . Rheumatoid arthritis(714.0)   . 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 Type II diabetes mellitus ([place])     Past Surgical History:  Procedure Laterality Date  . APPENDECTOMY  1968  . CATARACT EXTRACTION W/ INTRAOCULAR LENS  IMPLANT, BILATERAL Bilateral 11/2013-12/2013  . KNEE ARTHROPLASTY    . KNEE ARTHROSCOPY Left 1980's-1990's X 3  . MYOMECTOMY  1990's    There were no vitals filed for this visit.  Subjective Assessment - 04/15/18 0821    Subjective  Pt relays she is doing well, she agrees to DC plan after next visit. She relays some soreness and tightness but no pain    Currently in Pain?  No/denies         Access Code: [email or web address]  URL: [email or web address]  Date: 03/27/2018  Prepared by: Elsie Ra   Exercises reviewed and performed today Standing Single Leg Stance with Counter Support - 2 sets - 20 second hold X 3 bilat Standing Hip Abduction - 10 reps - 2 sets - bilat   Forward Step Up - 10 reps - 2 sets  bilat Lateral Step Up with Unilateral Counter Support - 10 reps - 2 sets bilat  Seated Hamstring Stretch - 2 sets - 60 hold bilat Wall Squat Hold with Ball - 10 reps - 1 set, 3 second hold  Alternating Single Leg Bridge - 10 reps - 2 sets - bilat Straight Leg Raise with External Rotation - 10 reps - 2 sets - bilat Sidelying Reverse Clamshell with Resistance - 10 reps - 2 sets  Sit to stand with ball squeeze X 10, 1 set Treadmill walking 2 mph, 6 min   PT Education - 04/15/18 0852    Education Details  HEP review, plan to DC next visit and transition over to senior center    [employer]) Educated  Patient    Methods  Explanation    Comprehension  Verbalized understanding;Returned demonstration       PT Short Term Goals - 03/22/18 1253      PT SHORT TERM GOAL #1   Title  STG=LTG'S        PT Long Term Goals - 04/11/18 0813      PT LONG TERM GOAL #1   Title  Pt will demonstrate independence with land and aquatic HEP as well as initiate community wellness program at gym/activity center    Status  On-going  PT LONG TERM GOAL #2   Title  Pt will improve gait speed to >/= 3.6 ft/sec indicative of normal walking speed and improved functional mobility     Baseline  3.5 ft/sec    Status  Partially Met      PT LONG TERM GOAL #3   Title  Pt will improve FGA to >/= 28/30    Baseline  28/30    Status  Achieved      PT LONG TERM GOAL #4   Title  Pt will perform stair negotiation consisting of 12 steps without rail, alternating sequence rating pain at </= 2/10    Baseline  0/10 when weather is warmer; demonstrated alternate method to pt on days when OA is more active (sideways) to decrease strain on L knee resulting in decreased pain    Status  Achieved      PT LONG TERM GOAL #5   Title  Pt will increase L knee flexion AROM by 5-8 degrees to demonstrate improvements in flexibility     Baseline  125 deg L knee flexion    Status  Achieved       PT LONG TERM GOAL #6   Title  Pt will increase LE strength to overall 4+/5 when assessed in sitting to demonstrate improvement in LE strengthening     Status  Achieved            Plan - 04/15/18 0853    Clinical Impression Statement  Session focused on HEP review and finalization as she will DC next visit and transition over to senior center. She showed good understanding and return demonstration of HEP. She had no further questions or concerns and feels ready to DC next visit.    Rehab Potential  Good    Clinical Impairments Affecting Rehab Potential   RA, osteoarthritis, degenerative disc disease of the spine, angioedema, DM, chronic use of systemic steroids, HTN, Scleritis, Hypokalemia     PT Frequency  2x / week    PT Duration  4 weeks    PT Treatment/Interventions  ADLs/Self Care Home Management;Gait training;Passive range of motion;Manual techniques;Stair training;Functional mobility training;Therapeutic activities;Patient/family education;Taping;Therapeutic exercise;Electrical Stimulation;Balance training;Moist Heat;Cryotherapy;Iontophoresis 35m/ml Dexamethasone;Aquatic Therapy    PT Next Visit Plan  DC next visit aquatic vs land    PT Home Exercise Plan  [email or web address].  Will need aquatic HEP    Consulted and Agree with Plan of Care  Patient       Patient will benefit from skilled therapeutic intervention in order to improve the following deficits and impairments:  Improper body mechanics, Pain, Decreased mobility, Decreased range of motion, Decreased strength, Hypomobility, Impaired perceived functional ability, Decreased balance, Decreased safety awareness, Difficulty walking, Increased edema, Impaired flexibility  Visit Diagnosis: Muscle weakness (generalized)  Other symptoms and signs involving the musculoskeletal system  Chronic pain of left knee     Problem List Patient Active Problem List   Diagnosis Date Noted  . Unilateral primary osteoarthritis, left knee 01/10/2018   . Chronic pain of left knee 01/10/2018  . DDD (degenerative disc disease), cervical 03/22/2016  . DDD (degenerative disc disease), lumbar 03/22/2016  . 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 Angioedema 07/15/2014  . Hypokalemia 07/15/2014  . [place] 07/03/2012  . HTN (hypertension) 07/03/2012  . Rheumatoid arthritis ([place] 07/03/2012  . Current chronic use of systemic steroids 07/03/2012  . 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

## 2018-04-18 ENCOUNTER — Ambulatory Visit: Payer: Medicare Other | Admitting: Physical Therapy

## 2018-04-18 ENCOUNTER — Encounter: Payer: Self-pay | Admitting: Physical Therapy

## 2018-04-18 DIAGNOSIS — M6281 Muscle weakness (generalized): Secondary | ICD-10-CM

## 2018-04-18 DIAGNOSIS — G8929 Other chronic pain: Secondary | ICD-10-CM

## 2018-04-18 DIAGNOSIS — M25562 Pain in left knee: Secondary | ICD-10-CM

## 2018-04-18 DIAGNOSIS — R29898 Other symptoms and signs involving the musculoskeletal system: Secondary | ICD-10-CM

## 2018-04-18 NOTE — Therapy (Signed)
[facility] [address] [place], [place], [address] Phone: [phone]   Fax:  [phone]  Physical Therapy Treatment and D/C Summary  Patient Details  Name: Wendy Woods MRN: [id number] Date of Birth: 07-31-1960 Referring Provider (PT): Joni Fears, MD   Encounter Date: 04/18/2018  PT End of Session - 04/18/18 1232    Visit Number  23    Number of Visits  23    Date for PT Re-Evaluation  04/25/18    Authorization Type  [employer] Medicare- Requires 10th visit PN    PT Start Time  1022    PT Stop Time  1105    PT Time Calculation (min)  43 min    Activity Tolerance  Patient tolerated treatment well    Behavior During Therapy  [facility] for tasks assessed/performed       Past Medical History:  Diagnosis Date  . DDD (degenerative disc disease), cervical 03/22/2016  . DDD (degenerative disc disease), lumbar 03/22/2016  . GERD (gastroesophageal reflux disease)   . Hyperlipidemia   . Hypertension   . Rheumatoid arthritis ([place])   . Rheumatoid arthritis(714.0)   . 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 Type II diabetes mellitus ([place])     Past Surgical History:  Procedure Laterality Date  . APPENDECTOMY  1968  . CATARACT EXTRACTION W/ INTRAOCULAR LENS  IMPLANT, BILATERAL Bilateral 11/2013-12/2013  . KNEE ARTHROPLASTY    . KNEE ARTHROSCOPY Left 1980's-1990's X 3  . MYOMECTOMY  1990's    There were no vitals filed for this visit.  Subjective Assessment - 04/18/18 0804    Subjective  Had a private session with Vinnie Level on Tuesday to finalize aquatic HEP.  Went to [facility] but pool is smaller; is Glass blower/designer at [employer].    Pertinent History  RA, osteoarthritis, degenerative disc disease of the spine, angioedema, DM, chronic use of systemic steroids, HTN, Scleritis, Hypokalemia     Limitations  Sitting;Walking;Lifting;Standing    How long can you sit comfortably?  2 hours before having to get up and move around      How long can you walk comfortably?  Pt reports ability to walk community distances with slight pain in L knee    Patient Stated Goals  Increase L LE strength; walk as much to delay L knee surgery.     Currently in Pain?  No/denies         Access Code: [email or web address]  URL: [email or web address]  Date: 04/18/2018  Prepared by: Misty Stanley   Exercises  Forward Step Up - 12 reps - 2 sets - 2x daily - 6x weekly  Lateral Step Up with Unilateral Counter Support - 12 reps - 2 sets - 2x daily - 6x weekly  Wall Squat Hold with Ball - 5 reps - 10 second hold - 1x daily - 7x weekly  Alternating Single Leg Bridge - 10 reps - 2 sets - 1x daily - 7x weekly  Straight Leg Raise with External Rotation - 10 reps - 2 sets - 1x daily - 7x weekly  Sidelying Reverse Clamshell with Resistance - 10 reps - 2 sets - 1x daily - 7x weekly  Prone Quadriceps Stretch with Strap - 2 sets - 30 second hold - 1x daily - 7x weekly  Supine Hamstring Stretch with Strap - 2 sets - 30 SECOND hold - 1x daily - 7x weekly  Supine ITB Stretch with Strap - 2 reps - 30 second hold - 1x daily - 7x weekly  PT Education - 04/18/18 1232    Education Details  final land HEP, D/C today    Person(s) Educated  Patient    Methods  Explanation;Demonstration;Handout    Comprehension  Verbalized understanding;Returned demonstration       PT Short Term Goals - 03/22/18 1253      PT SHORT TERM GOAL #1   Title  STG=LTG'S        PT Long Term Goals - 04/18/18 1233      PT LONG TERM GOAL #1   Title  Pt will demonstrate independence with land and aquatic HEP as well as initiate community wellness program at gym/activity center    Status  Achieved      PT LONG TERM GOAL #2   Title  Pt will improve gait speed to >/= 3.6 ft/sec indicative of normal walking speed and improved functional mobility     Baseline  3.5 ft/sec    Status  Partially Met      PT LONG TERM GOAL #3    Title  Pt will improve FGA to >/= 28/30    Baseline  28/30    Status  Achieved      PT LONG TERM GOAL #4   Title  Pt will perform stair negotiation consisting of 12 steps without rail, alternating sequence rating pain at </= 2/10    Baseline  0/10 when weather is warmer; demonstrated alternate method to pt on days when OA is more active (sideways) to decrease strain on L knee resulting in decreased pain    Status  Achieved      PT LONG TERM GOAL #5   Title  Pt will increase L knee flexion AROM by 5-8 degrees to demonstrate improvements in flexibility     Baseline  125 deg L knee flexion    Status  Achieved      PT LONG TERM GOAL #6   Title  Pt will increase LE strength to overall 4+/5 when assessed in sitting to demonstrate improvement in LE strengthening     Status  Achieved            Plan - 04/18/18 1233    Clinical Impression Statement  Pt has made excellent progress and has met 5/6 goals, partially meeting gait velocity goal.  Pt demonstrates improvements in LE strength, dynamic balance, decreased falls risk, improved gait and decreased pain in L knee.  Patient also demonstrates independence with land and aquatic based HEP.  Pt agreeable to D/C today.    Rehab Potential  Good    Clinical Impairments Affecting Rehab Potential   RA, osteoarthritis, degenerative disc disease of the spine, angioedema, DM, chronic use of systemic steroids, HTN, Scleritis, Hypokalemia     PT Frequency  2x / week    PT Duration  4 weeks    PT Treatment/Interventions  ADLs/Self Care Home Management;Gait training;Passive range of motion;Manual techniques;Stair training;Functional mobility training;Therapeutic activities;Patient/family education;Taping;Therapeutic exercise;Electrical Stimulation;Balance training;Moist Heat;Cryotherapy;Iontophoresis 43m/ml Dexamethasone;Aquatic Therapy    PT Next Visit Plan  D/C    PT Home Exercise Plan  [email or web address].  Will need aquatic HEP    Consulted and Agree with Plan of  Care  Patient       Patient will benefit from skilled therapeutic intervention in order to improve the following deficits and impairments:  Improper body mechanics, Pain, Decreased mobility, Decreased range of motion, Decreased strength, Hypomobility, Impaired perceived functional ability, Decreased balance, Decreased safety awareness, Difficulty walking, Increased edema, Impaired flexibility  Visit Diagnosis: Muscle  weakness (generalized)  Other symptoms and signs involving the musculoskeletal system  Chronic pain of left knee     Problem List Patient Active Problem List   Diagnosis Date Noted  . Unilateral primary osteoarthritis, left knee 01/10/2018  . Chronic pain of left knee 01/10/2018  . DDD (degenerative disc disease), cervical 03/22/2016  . DDD (degenerative disc disease), lumbar 03/22/2016  . 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 Angioedema 07/15/2014  . Hypokalemia 07/15/2014  . Diabetes mellitus, controlled ([place]) 07/03/2012  . HTN (hypertension) 07/03/2012  . Rheumatoid arthritis ([place]) 07/03/2012  . Current chronic use of systemic steroids 07/03/2012  . 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  Patient goals were met. Patient is being discharged due to meeting the stated rehab goals.  ?????     Rico Junker, PT, DPT 04/18/18    12:37 PM    [facility] [address] [place], [place], [address] Phone: [phone]   Fax:  [phone]  Name: Marcedes Tech MRN: [id number] Date of Birth: 04/03/1961

## 2018-04-18 NOTE — Patient Instructions (Signed)
Access Code: [email or web address]  URL: [email or web address]  Date: 04/18/2018  Prepared by: Bufford Lope   Exercises  Forward Step Up - 12 reps - 2 sets - 2x daily - 6x weekly  Lateral Step Up with Unilateral Counter Support - 12 reps - 2 sets - 2x daily - 6x weekly  Wall Squat Hold with Ball - 5 reps - 10 second hold - 1x daily - 7x weekly  Alternating Single Leg Bridge - 10 reps - 2 sets - 1x daily - 7x weekly  Straight Leg Raise with External Rotation - 10 reps - 2 sets - 1x daily - 7x weekly  Sidelying Reverse Clamshell with Resistance - 10 reps - 2 sets - 1x daily - 7x weekly  Prone Quadriceps Stretch with Strap - 2 sets - 30 second hold - 1x daily - 7x weekly  Supine Hamstring Stretch with Strap - 2 sets - 30 SECOND hold - 1x daily - 7x weekly  Supine ITB Stretch with Strap - 2 reps - 30 second hold - 1x daily - 7x weekly

## 2018-04-19 ENCOUNTER — Telehealth: Payer: Self-pay | Admitting: Pharmacy Technician

## 2018-04-19 NOTE — Telephone Encounter (Signed)
Called the patient to verify benefits for 2020. Pt currently received infusions that may require a pre-certification. Patient states that her insurance plans will not change for 2020. She will continue to have [name]. Will submit an anuual reverification to [employer] and update once we received a response  3:01 PM Dorthula Nettles, CPhT

## 2018-04-25 ENCOUNTER — Other Ambulatory Visit: Payer: Self-pay | Admitting: *Deleted

## 2018-04-25 DIAGNOSIS — M0579 Rheumatoid arthritis with rheumatoid factor of multiple sites without organ or systems involvement: Secondary | ICD-10-CM

## 2018-04-26 ENCOUNTER — Other Ambulatory Visit (HOSPITAL_COMMUNITY): Payer: Self-pay | Admitting: *Deleted

## 2018-04-29 ENCOUNTER — Ambulatory Visit (HOSPITAL_COMMUNITY)
Admission: RE | Admit: 2018-04-29 | Discharge: 2018-04-29 | Disposition: A | Discharge status: Home / Self Care | Payer: Medicare Other | Source: Ambulatory Visit | Attending: Rheumatology | Admitting: Rheumatology

## 2018-04-29 DIAGNOSIS — M0579 Rheumatoid arthritis with rheumatoid factor of multiple sites without organ or systems involvement: Secondary | ICD-10-CM | POA: Diagnosis present

## 2018-04-29 LAB — COMPREHENSIVE METABOLIC PANEL WITH GFR
ALT: 24 U/L (ref 0–44)
AST: 31 U/L (ref 15–41)
Albumin: 3.7 g/dL (ref 3.5–5.0)
Alkaline Phosphatase: 58 U/L (ref 38–126)
Anion gap: 11 (ref 5–15)
BUN: 10 mg/dL (ref 6–20)
CO2: 26 mmol/L (ref 22–32)
Calcium: 9.4 mg/dL (ref 8.9–10.3)
Chloride: 100 mmol/L (ref 98–111)
Creatinine, Ser: 0.89 mg/dL (ref 0.44–1.00)
GFR calc Af Amer: >60 mL/min
GFR calc non Af Amer: >60 mL/min
Glucose, Bld: 137 mg/dL — ABNORMAL HIGH (ref 70–99)
Potassium: 4.1 mmol/L (ref 3.5–5.1)
Sodium: 137 mmol/L (ref 135–145)
Total Bilirubin: 1.2 mg/dL (ref 0.3–1.2)
Total Protein: 7.7 g/dL (ref 6.5–8.1)

## 2018-04-29 LAB — CBC
HCT: 40.4 % (ref 36.0–46.0)
Hemoglobin: 13.2 g/dL (ref 12.0–15.0)
MCH: 29.3 pg (ref 26.0–34.0)
MCHC: 32.7 g/dL (ref 30.0–36.0)
MCV: 89.8 fL (ref 80.0–100.0)
PLATELETS: 379 10*3/uL (ref 150–400)
RBC: 4.5 MIL/uL (ref 3.87–5.11)
RDW: 13.2 % (ref 11.5–15.5)
WBC: 5.4 10*3/uL (ref 4.0–10.5)
nRBC: 0 % (ref 0.0–0.2)

## 2018-04-29 MED ORDER — ACETAMINOPHEN 325 MG PO TABS: 650.0000 mg | ORAL_TABLET | ORAL | Status: DC

## 2018-04-29 MED ORDER — SODIUM CHLORIDE 0.9 % IV SOLN
6.0000 mg/kg | INTRAVENOUS | Status: DC
  Administered 2018-04-29: 500 mg via INTRAVENOUS
  Filled 2018-04-29: qty 50

## 2018-04-29 MED ORDER — DIPHENHYDRAMINE HCL 25 MG PO CAPS: 25.0000 mg | ORAL_CAPSULE | ORAL | Status: DC

## 2018-05-06 ENCOUNTER — Ambulatory Visit (INDEPENDENT_AMBULATORY_CARE_PROVIDER_SITE_OTHER): Payer: Medicare Other | Admitting: Rheumatology

## 2018-05-06 DIAGNOSIS — M1712 Unilateral primary osteoarthritis, left knee: Secondary | ICD-10-CM | POA: Diagnosis not present

## 2018-05-06 DIAGNOSIS — M170 Bilateral primary osteoarthritis of knee: Secondary | ICD-10-CM

## 2018-05-06 MED ORDER — LIDOCAINE HCL 1 % IJ SOLN
1.5000 mL | INTRAMUSCULAR | Status: AC | PRN
  Administered 2018-05-06: 1.5 mL

## 2018-05-06 MED ORDER — HYLAN G-F 20 16 MG/2ML IX SOSY
16.0000 mg | PREFILLED_SYRINGE | INTRA_ARTICULAR | Status: AC | PRN
  Administered 2018-05-06: 16 mg via INTRA_ARTICULAR

## 2018-05-06 NOTE — Progress Notes (Signed)
   Procedure Note  Patient: Wendy Woods             Date of Birth: Dec 08, 1960           MRN: [id number]             Visit Date: 05/06/2018  Procedures: Visit Diagnoses: No diagnosis found. Synvisc #1 left knee B/B Large Joint Inj: L knee on 05/06/2018 11:08 AM Indications: pain Details: 25 G 1.5 in needle, medial approach  Arthrogram: No  Medications: 16 mg Hylan 16 MG/2ML; 1.5 mL lidocaine 1 % Aspirate: 0 mL Outcome: tolerated well, no immediate complications Procedure, treatment alternatives, risks and benefits explained, specific risks discussed. Consent was given by the patient. Immediately prior to procedure a time out was called to verify the correct patient, procedure, equipment, support staff and site/side marked as required. Patient was prepped and draped in the usual sterile fashion.     Pollyann Savoy, MD

## 2018-05-13 ENCOUNTER — Ambulatory Visit (INDEPENDENT_AMBULATORY_CARE_PROVIDER_SITE_OTHER): Payer: Medicare Other | Admitting: Physician Assistant

## 2018-05-13 DIAGNOSIS — M1712 Unilateral primary osteoarthritis, left knee: Secondary | ICD-10-CM

## 2018-05-13 DIAGNOSIS — M170 Bilateral primary osteoarthritis of knee: Secondary | ICD-10-CM

## 2018-05-13 MED ORDER — HYLAN G-F 20 16 MG/2ML IX SOSY
16.0000 mg | PREFILLED_SYRINGE | INTRA_ARTICULAR | Status: AC | PRN
  Administered 2018-05-13: 16 mg via INTRA_ARTICULAR

## 2018-05-13 MED ORDER — LIDOCAINE HCL 1 % IJ SOLN
1.5000 mL | INTRAMUSCULAR | Status: AC | PRN
  Administered 2018-05-13: 1.5 mL

## 2018-05-13 NOTE — Progress Notes (Signed)
   Procedure Note  Patient: Wendy Woods             Date of Birth: 1960-11-29           MRN: [id number]             Visit Date: 05/13/2018  Procedures: Visit Diagnoses: Primary osteoarthritis of both knees Synvisc #2 left knee B/B Large Joint Inj: L knee on 05/13/2018 11:15 AM Indications: pain Details: 25 G 1.5 in needle, medial approach  Arthrogram: No  Medications: 16 mg Hylan 16 MG/2ML; 1.5 mL lidocaine 1 % Aspirate: 0 mL Outcome: tolerated well, no immediate complications Procedure, treatment alternatives, risks and benefits explained, specific risks discussed. Consent was given by the patient. Immediately prior to procedure a time out was called to verify the correct patient, procedure, equipment, support staff and site/side marked as required. Patient was prepped and draped in the usual sterile fashion.    Patient tolerated the procedure well.  Sherron Ales, PA-C

## 2018-05-18 IMAGING — MG 2D DIGITAL SCREENING BILATERAL MAMMOGRAM WITH CAD AND ADJUNCT TO
8 of 12 series · 8 of 28 positions shown · non-contrast
Comparison: Previous exam(s).

CLINICAL DATA: Screening.

EXAM:
2D DIGITAL SCREENING BILATERAL MAMMOGRAM WITH CAD AND ADJUNCT TOMO

[R CC synth-2D]
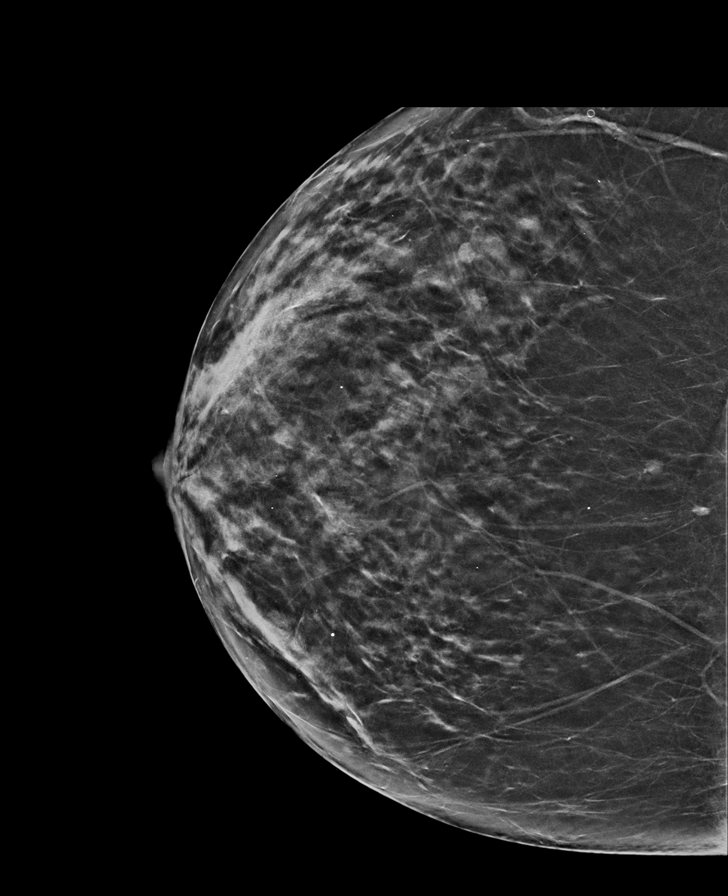

[R MLO synth-2D]
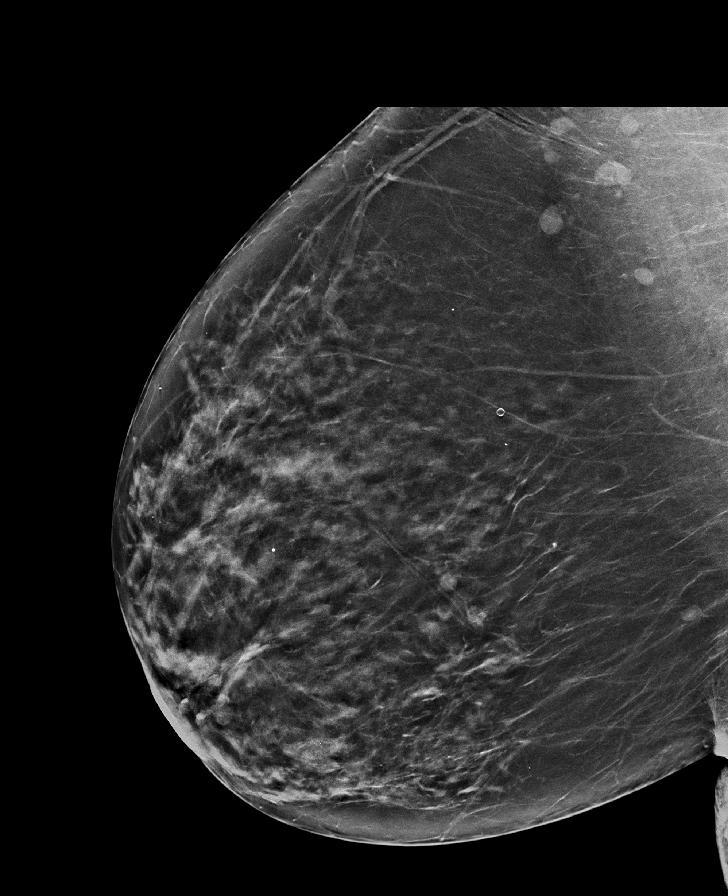

[R MLO]
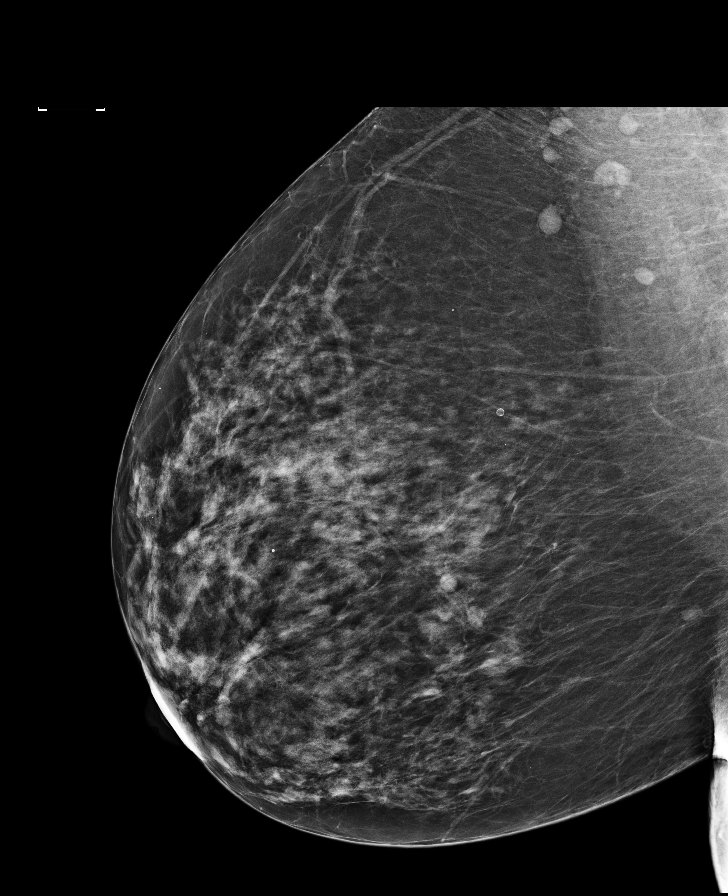

[R CC]
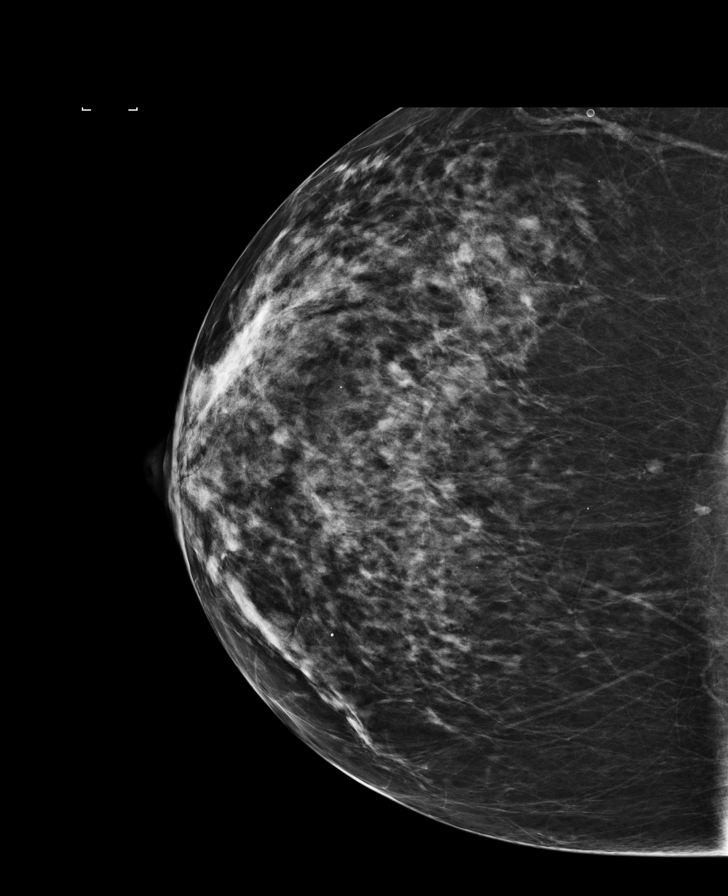

[L CC synth-2D]
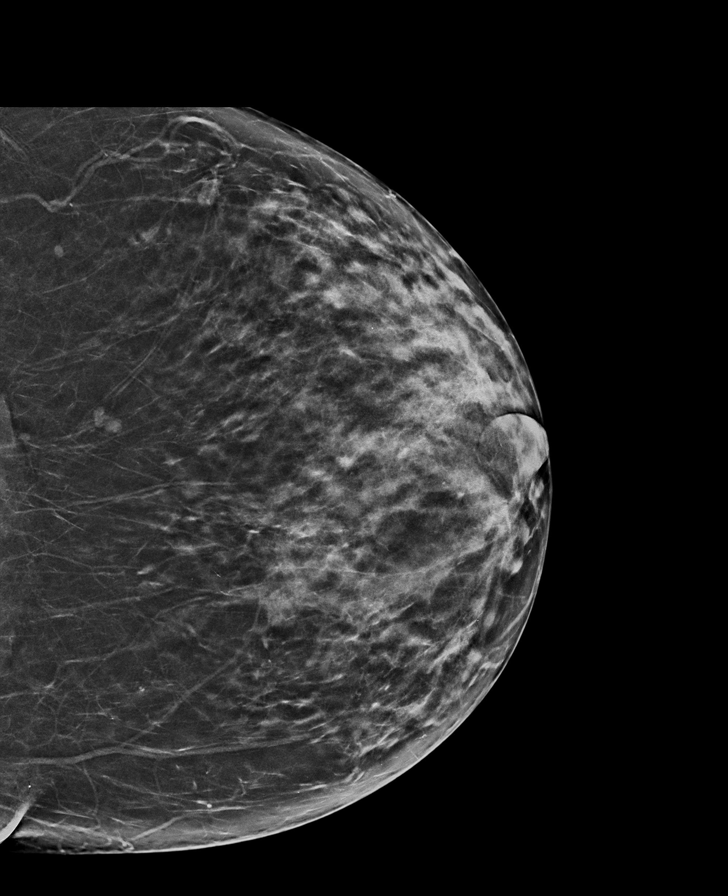

[L CC]
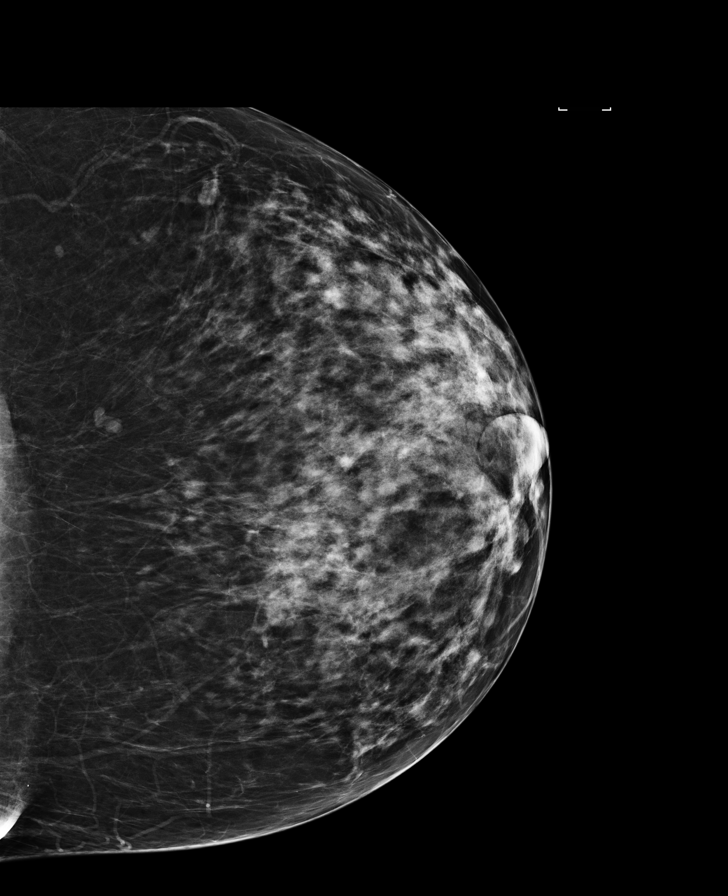

[L MLO synth-2D]
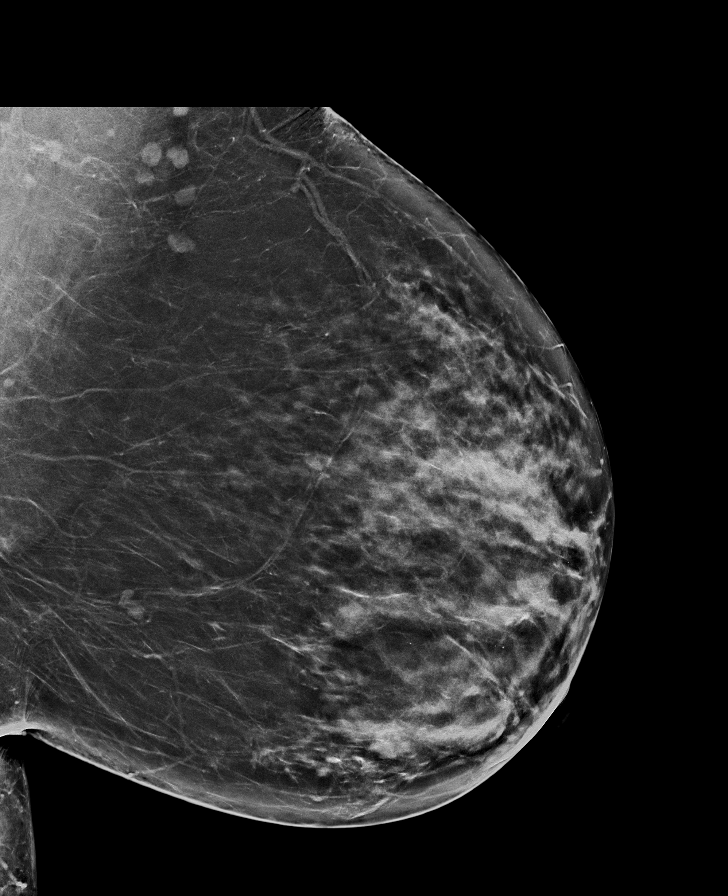

[L MLO]
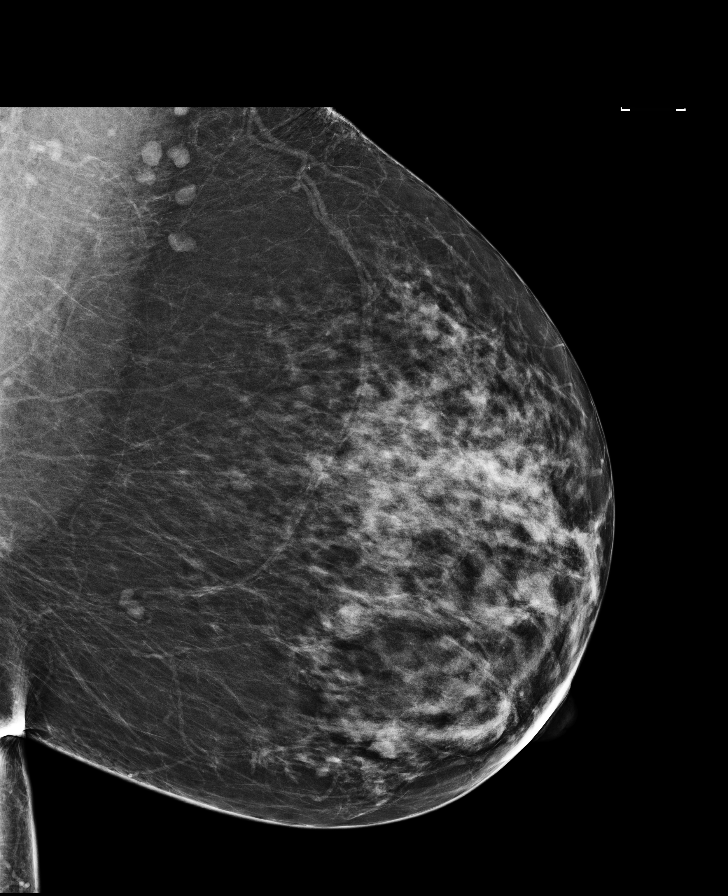

[8 of 28 positions shown; findings below may reference images not displayed]

ACR Breast Density Category c: The breast tissue is heterogeneously
dense, which may obscure small masses.
FINDINGS: There are no findings suspicious for malignancy. Images were
processed with CAD.
IMPRESSION: No mammographic evidence of malignancy. A result letter of this
screening mammogram will be mailed directly to the patient.

RECOMMENDATION:
Screening mammogram in one year. (Code:[id number]-[id number])

BI-RADS CATEGORY  1: Negative.

## 2018-05-20 ENCOUNTER — Ambulatory Visit (INDEPENDENT_AMBULATORY_CARE_PROVIDER_SITE_OTHER): Payer: Medicare Other | Admitting: Physician Assistant

## 2018-05-20 DIAGNOSIS — M1712 Unilateral primary osteoarthritis, left knee: Secondary | ICD-10-CM | POA: Diagnosis not present

## 2018-05-20 MED ORDER — LIDOCAINE HCL 1 % IJ SOLN
1.5000 mL | INTRAMUSCULAR | Status: AC | PRN
  Administered 2018-05-20: 1.5 mL

## 2018-05-20 MED ORDER — HYLAN G-F 20 16 MG/2ML IX SOSY
16.0000 mg | PREFILLED_SYRINGE | INTRA_ARTICULAR | Status: AC | PRN
  Administered 2018-05-20: 16 mg via INTRA_ARTICULAR

## 2018-05-20 NOTE — Progress Notes (Signed)
   Procedure Note  Patient: Wendy Woods             Date of Birth: 06-20-1960           MRN: [id number]             Visit Date: 05/20/2018  Procedures: Visit Diagnoses: Primary osteoarthritis of left knee - Plan: Large Joint Inj: L knee  Synvisc #3 Left knee joint injection B/B  Large Joint Inj: L knee on 05/20/2018 9:36 AM Indications: pain Details: 25 G 1.5 in needle, medial approach  Arthrogram: No  Medications: 16 mg Hylan 16 MG/2ML; 1.5 mL lidocaine 1 % Aspirate: 0 mL Outcome: tolerated well, no immediate complications Procedure, treatment alternatives, risks and benefits explained, specific risks discussed. Consent was given by the patient. Immediately prior to procedure a time out was called to verify the correct patient, procedure, equipment, support staff and site/side marked as required. Patient was prepped and draped in the usual sterile fashion.     Patient tolerated the procedure well.  Sherron Ales, PA-C

## 2018-05-27 NOTE — Progress Notes (Signed)
Office Visit Note  Patient: Wendy Woods             Date of Birth: 1960-06-10           MRN: [id number]             PCP: Fleet ContrasAvbuere, Edwin, MD Referring: Fleet ContrasAvbuere, Edwin, MD Visit Date: 06/10/2018 Occupation: @GUAROCC @  Subjective:  Pain in both hands   History of Present Illness: Wendy Woods is a 58 y.o. female with history of seropositive rheumatoid arthritis, osteoarthritis, and DDD. She is on Remicade 6 mg/kg every 6 weeks, Otrexup 20 mg sq once weekly, and folic acid 2 mg po daily.  She is due for her next Remicade infusion today.  She denies any recent flares.  She denies missing any doses of these medications recently.  She denies any recent infections.  She reports that her right knee pain has improved.  She denies any joint swelling at this time.  She continues to have left trochanter bursitis but reports that she exercises on a regular basis.  He has intermittent lower back pain is relieved by stretching.  She is planning on starting to exercise and do water aerobics.  She reports that she has some myalgias and fatigue.  She reports she is having discomfort in her right second MCP and left third MCP joint.  She denies any joint swelling.     Activities of Daily Living:  Patient reports morning stiffness for 10 minutes.   Patient Denies nocturnal pain.  Difficulty dressing/grooming: Denies Difficulty climbing stairs: Denies Difficulty getting out of chair: Denies Difficulty using hands for taps, buttons, cutlery, and/or writing: Reports  Review of Systems  Constitutional: Positive for fatigue.  HENT: Positive for mouth dryness. Negative for mouth sores and nose dryness.   Eyes: Positive for dryness. Negative for pain and visual disturbance.  Respiratory: Negative for cough, hemoptysis, shortness of breath and difficulty breathing.   Cardiovascular: Negative for chest pain, palpitations, hypertension and swelling in legs/feet.  Gastrointestinal: Negative for blood in stool,  constipation and diarrhea.  Endocrine: Negative for increased urination.  Genitourinary: Negative for difficulty urinating and painful urination.  Musculoskeletal: Positive for arthralgias, joint pain, joint swelling, morning stiffness and muscle tenderness. Negative for myalgias, muscle weakness and myalgias.  Skin: Negative for color change, pallor, rash, hair loss, nodules/bumps, skin tightness, ulcers and sensitivity to sunlight.  Allergic/Immunologic: Negative for susceptible to infections.  Neurological: Negative for dizziness, numbness, headaches and weakness.  Hematological: Negative for bruising/bleeding tendency and swollen glands.  Psychiatric/Behavioral: Negative for depressed mood and sleep disturbance. The patient is not nervous/anxious.     PMFS History:  Patient Active Problem List   Diagnosis Date Noted  . Unilateral primary osteoarthritis, left knee 01/10/2018  . Chronic pain of left knee 01/10/2018  . DDD (degenerative disc disease), cervical 03/22/2016  . DDD (degenerative disc disease), lumbar 03/22/2016  . 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 Angioedema 07/15/2014  . Hypokalemia 07/15/2014  . Diabetes mellitus, controlled (HCC) 07/03/2012  . HTN (hypertension) 07/03/2012  . Rheumatoid arthritis (HCC) 07/03/2012  . Current chronic use of systemic steroids 07/03/2012  . 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 DDD (degenerative disc disease), cervical 03/22/2016  . DDD (degenerative disc disease), lumbar 03/22/2016  . GERD (gastroesophageal reflux disease)   . Hyperlipidemia   . Hypertension   . Rheumatoid arthritis (HCC)   . Rheumatoid arthritis(714.0)   . 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 Type II diabetes  mellitus (HCC)     Family History  Problem Relation Age of Onset  . Colon polyps Father   . Prostate cancer Father   . Breast cancer Cousin 26  . Colon cancer Neg Hx    Past Surgical History:  Procedure Laterality Date  .  APPENDECTOMY  1968  . CATARACT EXTRACTION W/ INTRAOCULAR LENS  IMPLANT, BILATERAL Bilateral 11/2013-12/2013  . KNEE ARTHROPLASTY    . KNEE ARTHROSCOPY Left 1980's-1990's X 3  . MYOMECTOMY  1990's   Social History   Social History Narrative  . Not on file    There is no immunization history on file for this patient.   Objective: Vital Signs: BP 120/70 (BP Location: Left Arm, Patient Position: Sitting, Cuff Size: Normal)   Pulse 69   Resp 16   Ht 5\' 4"  (1.626 m)   Wt 175 lb (79.4 kg)   LMP 07/09/2014   BMI 30.04 kg/m    Physical Exam Vitals signs and nursing note reviewed.  Constitutional:      Appearance: She is well-developed.  HENT:     Head: Normocephalic and atraumatic.  Eyes:     Conjunctiva/sclera: Conjunctivae normal.  Neck:     Musculoskeletal: Normal range of motion.  Cardiovascular:     Rate and Rhythm: Normal rate and regular rhythm.     Heart sounds: Normal heart sounds.  Pulmonary:     Effort: Pulmonary effort is normal.     Breath sounds: Normal breath sounds.  Abdominal:     General: Bowel sounds are normal.     Palpations: Abdomen is soft.  Lymphadenopathy:     Cervical: No cervical adenopathy.  Skin:    General: Skin is warm and dry.     Capillary Refill: Capillary refill takes less than 2 seconds.  Neurological:     Mental Status: She is alert and oriented to person, place, and time.  Psychiatric:        Behavior: Behavior normal.      Musculoskeletal Exam: C-spine limited ROM.  Thoracic and lumbar spine good ROM.  No midline spinal tenderness.  No SI joint tenderness.  Shoulder joints good ROM with bilateral crepitus but no warmth or effusion.  Elbow joints, wrist joints, MCPs, PIPs, and DIPs good ROM with no synovitis.  Tenderness of the right 2nd MCP and left 3rd MCP.  Hip joints, knee joints, ankle joints, MTPs, PIPs, and DIPs good ROM with no synovitis.  No warmth or effusion of knee joints.  No tenderness or swelling of ankle joints.  No  achilles tendonitis or plantar fasciitis.  Tenderness over left trochanteric bursa.   CDAI Exam: CDAI Score: 2.8  Patient Global Assessment: 4 (mm); Provider Global Assessment: 4 (mm) Swollen: 0 ; Tender: 2  Joint Exam      Right  Left  MCP 2   Tender     MCP 3      Tender   There is currently no information documented on the homunculus. Go to the Rheumatology activity and complete the homunculus joint exam.  Investigation: No additional findings.  Imaging: No results found.  Recent Labs: Lab Results  Component Value Date   WBC 5.4 04/29/2018   HGB 13.2 04/29/2018   PLT 379 04/29/2018   NA 137 04/29/2018   K 4.1 04/29/2018   CL 100 04/29/2018   CO2 26 04/29/2018   GLUCOSE 137 (H) 04/29/2018   BUN 10 04/29/2018   CREATININE 0.89 04/29/2018   BILITOT 1.2 04/29/2018  ALKPHOS 58 04/29/2018   AST 31 04/29/2018   ALT 24 04/29/2018   PROT 7.7 04/29/2018   ALBUMIN 3.7 04/29/2018   CALCIUM 9.4 04/29/2018   GFRAA >60 04/29/2018   QFTBGOLD Negative 09/18/2016   QFTBGOLDPLUS Negative 12/24/2017    Speciality Comments: Remicade 6mg / kg every 6 weeks TB gold negative 08/2016  Procedures:  No procedures performed Allergies: Latex; Lisinopril; and Penicillins   Assessment / Plan:     Visit Diagnoses: Rheumatoid arthritis involving multiple sites with positive rheumatoid factor (HCC) - + RF, Anti-CCP +, ANA -: She has no synovitis on exam.  She has not any recent rheumatoid arthritis flares.  She has tenderness of the right second MCP and left third MCP joint.  She is clinically doing well on combination therapy of Remicade 6 mg/kg infusions every 6 weeks, Otrexup 20 mg subcutaneous weekly injections, and folic acid 2 mg by mouth daily.  She is due for her next infusion today at 10 AM.  She feels as though these medications have been effective and does not want to make any changes at this time.  She does not need any refills at this time.  She has not had any recent infections.   She was reminded that anytime she does have an infection she is to hold her postpone her infusion and injection.  She was advised to notify us if she develops signs or symptoms of rheumatoid arthritis flare.  She will follow-up in the office in 5 months.  High risk medication use - Remicade 6 mg/kg every 6 weeks, Otrexup 20 mg every week, folic acid 2 mg by mouth daily.  TB gold negative 12/24/2017.  Her next Remicade infusion is today where labs will be obtained.  Primary osteoarthritis of both hands: She has complete fist formation bilaterally.  No synovitis noted.  She has subluxation of DIP of the 3rd digit.  She was given a handout of hand exercises that she can perform.  Joint protection and muscle strengthening were discussed.  Primary osteoarthritis of both knees: No warmth or effusion.  Good range of motion.  She had left knee synvisc injections January 2020 which provided significant relief.  DDD (degenerative disc disease), cervical: She has limited range of motion with lateral rotation.  She has no symptoms of radiculopathy at this time.  She was encouraged to continue to work on neck range of motion exercises.  DDD (degenerative disc disease), lumbar: She has intermittent lower back pain.  She has mild midline spinal tenderness in the lumbar region.  We discussed core strengthening and lower back exercises.  She was given a handout of core strengthening exercises.    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   Trochanteric bursitis, left hip: She has tenderness over the left trochanteric bursa.  She was encouraged to continue to perform stretching exercises on a regular basis.    Other medical conditions are listed as follows:   Essential hypertension  History of diabetes mellitus, type II  Ganglion cyst of dorsum of left wrist   Orders: No orders of the defined types were placed in this encounter.  No orders of the defined types were placed in this encounter.    Follow-Up  Instructions: Return in about 5 months (around 11/08/2018) for Osteoarthritis, Rheumatoid arthritis, DDD.   Gearldine Bienenstockaylor M , PA-C  Note - This record has been created using Dragon software.  Chart creation errors have been sought, but may not always  have been located. Such creation errors do  not reflect on  the standard of medical care.

## 2018-06-07 ENCOUNTER — Other Ambulatory Visit: Payer: Self-pay | Admitting: *Deleted

## 2018-06-07 NOTE — Progress Notes (Signed)
Infusion orders are current for patient CBC CMP Tylenol Benadryl appointments are up to date and follow up appointment  is scheduled TB gold not due yet.  

## 2018-06-10 ENCOUNTER — Encounter: Payer: Self-pay | Admitting: Physician Assistant

## 2018-06-10 ENCOUNTER — Ambulatory Visit (INDEPENDENT_AMBULATORY_CARE_PROVIDER_SITE_OTHER): Payer: Medicare Other | Admitting: Physician Assistant

## 2018-06-10 ENCOUNTER — Ambulatory Visit (HOSPITAL_COMMUNITY)
Admission: RE | Admit: 2018-06-10 | Discharge: 2018-06-10 | Disposition: A | Discharge status: Home / Self Care | Payer: Medicare Other | Source: Ambulatory Visit | Attending: Rheumatology | Admitting: Rheumatology

## 2018-06-10 VITALS — BP 120/70 | HR 69 | Resp 16 | Ht 64.0 in | Wt 175.0 lb

## 2018-06-10 DIAGNOSIS — M19041 Primary osteoarthritis, right hand: Secondary | ICD-10-CM

## 2018-06-10 DIAGNOSIS — M170 Bilateral primary osteoarthritis of knee: Secondary | ICD-10-CM

## 2018-06-10 DIAGNOSIS — Z79899 Other long term (current) drug therapy: Secondary | ICD-10-CM | POA: Diagnosis not present

## 2018-06-10 DIAGNOSIS — M0579 Rheumatoid arthritis with rheumatoid factor of multiple sites without organ or systems involvement: Secondary | ICD-10-CM | POA: Diagnosis not present

## 2018-06-10 DIAGNOSIS — Z8639 Personal history of other endocrine, nutritional and metabolic disease: Secondary | ICD-10-CM

## 2018-06-10 DIAGNOSIS — M19042 Primary osteoarthritis, left hand: Secondary | ICD-10-CM

## 2018-06-10 DIAGNOSIS — M5030 Other cervical disc degeneration, unspecified cervical region: Secondary | ICD-10-CM

## 2018-06-10 DIAGNOSIS — I10 Essential (primary) hypertension: Secondary | ICD-10-CM

## 2018-06-10 DIAGNOSIS — H15003 Unspecified scleritis, bilateral: Secondary | ICD-10-CM

## 2018-06-10 DIAGNOSIS — M67432 Ganglion, left wrist: Secondary | ICD-10-CM

## 2018-06-10 DIAGNOSIS — M7062 Trochanteric bursitis, left hip: Secondary | ICD-10-CM

## 2018-06-10 DIAGNOSIS — M5136 Other intervertebral disc degeneration, lumbar region: Secondary | ICD-10-CM

## 2018-06-10 LAB — COMPREHENSIVE METABOLIC PANEL
ALT: 23 U/L (ref 0–44)
AST: 26 U/L (ref 15–41)
Albumin: 3.4 g/dL — ABNORMAL LOW (ref 3.5–5.0)
Alkaline Phosphatase: 56 U/L (ref 38–126)
Anion gap: 5 (ref 5–15)
BUN: 10 mg/dL (ref 6–20)
CO2: 30 mmol/L (ref 22–32)
Calcium: 9.7 mg/dL (ref 8.9–10.3)
Chloride: 105 mmol/L (ref 98–111)
Creatinine, Ser: 0.94 mg/dL (ref 0.44–1.00)
GFR calc Af Amer: >60 mL/min (ref >60)
GFR calc non Af Amer: >60 mL/min (ref >60)
Glucose, Bld: 113 mg/dL — ABNORMAL HIGH (ref 70–99)
Potassium: 4 mmol/L (ref 3.5–5.1)
Sodium: 140 mmol/L (ref 135–145)
Total Bilirubin: 0.7 mg/dL (ref 0.3–1.2)
Total Protein: 7.8 g/dL (ref 6.5–8.1)

## 2018-06-10 LAB — CBC
HCT: 40.4 % (ref 36.0–46.0)
Hemoglobin: 13 g/dL (ref 12.0–15.0)
MCH: 29.2 pg (ref 26.0–34.0)
MCHC: 32.2 g/dL (ref 30.0–36.0)
MCV: 90.8 fL (ref 80.0–100.0)
Platelets: 373 10*3/uL (ref 150–400)
RBC: 4.45 MIL/uL (ref 3.87–5.11)
RDW: 13.2 % (ref 11.5–15.5)
WBC: 5.9 10*3/uL (ref 4.0–10.5)
nRBC: 0 % (ref 0.0–0.2)

## 2018-06-10 MED ORDER — DIPHENHYDRAMINE HCL 25 MG PO CAPS
25.0000 mg | ORAL_CAPSULE | ORAL | Status: DC
  Administered 2018-06-10: 25 mg via ORAL

## 2018-06-10 MED ORDER — DIPHENHYDRAMINE HCL 25 MG PO CAPS
ORAL_CAPSULE | ORAL | Status: AC
  Administered 2018-06-10: 25 mg via ORAL
  Filled 2018-06-10: qty 1

## 2018-06-10 MED ORDER — SODIUM CHLORIDE 0.9 % IV SOLN
6.0000 mg/kg | INTRAVENOUS | Status: DC
  Administered 2018-06-10: 500 mg via INTRAVENOUS
  Filled 2018-06-10: qty 50

## 2018-06-10 MED ORDER — ACETAMINOPHEN 325 MG PO TABS
650.0000 mg | ORAL_TABLET | ORAL | Status: DC
  Administered 2018-06-10: 650 mg via ORAL

## 2018-06-10 MED ORDER — ACETAMINOPHEN 325 MG PO TABS
ORAL_TABLET | ORAL | Status: AC
  Administered 2018-06-10: 650 mg via ORAL
  Filled 2018-06-10: qty 2

## 2018-06-10 NOTE — Patient Instructions (Addendum)
Core Strength Exercises  Core exercises help to build strength in the muscles between your ribs and your hips (abdominal muscles). These muscles help to support your body and keep your spine stable. It is important to maintain strength in your core to prevent injury and pain. Some activities, such as yoga and Pilates, can help to strengthen core muscles. You can also strengthen core muscles with exercises at home. It is important to talk to your health care provider before you start a new exercise routine. What are the benefits of core strength exercises? Core strength exercises can:  Reduce back pain.  Help to rebuild strength after a back or spine injury.  Help to prevent injury during physical activity, especially injuries to the back and knees. How to do core strength exercises Repeat these exercises 10-15 times, or until you are tired. Do exercises exactly as told by your health care provider and adjust them as directed. It is normal to feel mild stretching, pulling, tightness, or discomfort as you do these exercises. If you feel any pain while doing these exercises, stop. If your pain continues or gets worse when doing core exercises, contact your health care provider. You may want to use a padded yoga or exercise mat for strength exercises that are done on the floor. Bridging  1. Lie on your back on a firm surface with your knees bent and your feet flat on the floor. 2. Raise your hips so that your knees, hips, and shoulders form a straight line together. Keep your abdominal muscles tight. 3. Hold this position for 3-5 seconds. 4. Slowly lower your hips to the starting position. 5. Let your muscles relax completely between repetitions. Single-leg bridge 1. Lie on your back on a firm surface with your knees bent and your feet flat on the floor. 2. Raise your hips so that your knees, hips, and shoulders form a straight line together. Keep your abdominal muscles tight. 3. Lift one foot  off the floor, then completely straighten that leg. 4. Hold this position for 3-5 seconds. 5. Put the straight leg back down in the bent position. 6. Slowly lower your hips to the starting position. 7. Repeat these steps using your other leg. Side bridge 1. Lie on your side with your knees bent. Prop yourself up on the elbow that is near the floor. 2. Using your abdominal muscles and your elbow that is on the floor, raise your body off the floor. Raise your hip so that your shoulder, hip, and foot form a straight line together. 3. Hold this position for 10 seconds. Keep your head and neck raised and away from your shoulder (in their normal, neutral position). Keep your abdominal muscles tight. 4. Slowly lower your hip to the starting position. 5. Repeat and try to hold this position longer, working your way up to 30 seconds. Abdominal crunch 1. Lie on your back on a firm surface. Bend your knees and keep your feet flat on the floor. 2. Cross your arms over your chest. 3. Without bending your neck, tip your chin slightly toward your chest. 4. Tighten your abdominal muscles as you lift your chest just high enough to lift your shoulder blades off of the floor. Do not hold your breath. You can do this with short lifts or long lifts. 5. Slowly return to the starting position. Bird dog 1. Get on your hands and knees, with your legs shoulder-width apart and your arms under your shoulders. Keep your back straight. 2. Tighten   your abdominal muscles. 3. Raise one of your legs off the floor and straighten it. Try to keep it parallel to the floor. 4. Slowly lower your leg to the starting position. 5. Raise one of your arms off the floor and straighten it. Try to keep it parallel to the floor. 6. Slowly lower your arm to the starting position. 7. Repeat with the other arm and leg. If possible, try raising a leg and arm at the same time, on opposite sides of the body. For example, raise your left hand and  your right leg. Plank 1. Lie on your belly. 2. Prop up your body onto your forearms and your feet, keeping your legs straight. Your body should make a straight line between your shoulders and feet. 3. Hold this position for 10 seconds while keeping your abdominal muscles tight. 4. Lower your body to the starting position. 5. Repeat and try to hold this position longer, working your way up to 30 seconds. Cross-core strengthening 1. Stand with your feet shoulder-width apart. 2. Hold a ball out in front of you. Keep your arms straight. 3. Tighten your abdominal muscles and slowly rotate at your waist from side to side. Keep your feet flat. 4. Once you are comfortable, try repeating this exercise with a heavier ball. Top core strengthening 1. Stand about 18 inches (46 cm) in front of a wall, with your back to the wall. 2. Keep your feet flat and shoulder-width apart. 3. Tighten your abdominal muscles. 4. Bend your hips and knees. 5. Slowly reach between your legs to touch the wall behind you. 6. Slowly stand back up. 7. Raise your arms over your head and reach behind you. 8. Return to the starting position. General tips  Do not do any exercises that cause pain. If you have pain while exercising, talk to your health care provider.  Always stretch before and after doing these exercises. This can help prevent injury.  Maintain a healthy weight. Ask your health care provider what weight is healthy for you. Contact a health care provider if:  You have back pain that gets worse or does not go away.  You feel pain while doing core strength exercises. Get help right away if:  You have severe pain that does not get better with medicine. Summary  Core exercises help to build strength in the muscles between your ribs and your waist.  Core muscles help to support your body and keep your spine stable.  Some activities, such as yoga and Pilates, can help to strengthen core muscles.  Core  strength exercises can help back pain and can prevent injury.  If you feel any pain while doing core strength exercises, stop. This information is not intended to replace advice given to you by your health care provider. Make sure you discuss any questions you have with your health care provider. Document Released: 09/06/2016 Document Revised: 09/06/2016 Document Reviewed: 09/06/2016 Elsevier Interactive Patient Education  2019 [employer]  Hand Exercises Hand exercises can be helpful to almost anyone. These exercises can strengthen the hands, improve flexibility and movement, and increase blood flow to the hands. These results can make work and daily tasks easier. Hand exercises can be especially helpful for people who have joint pain from arthritis or have nerve damage from overuse (carpal tunnel syndrome). These exercises can also help people who have injured a hand. Most of these hand exercises are fairly gentle stretching routines. You can do them often throughout the day. Still, it  is a good idea to ask your health care provider which exercises would be best for you. Warming your hands before exercise may help to reduce stiffness. You can do this with gentle massage or by placing your hands in warm water for 15 minutes. Also, make sure you pay attention to your level of hand pain as you begin an exercise routine. Exercises Knuckle bend Repeat this exercise 5-10 times with each hand. 1. Stand or sit with your arm, hand, and all five fingers pointed straight up. Make sure your wrist is straight. 2. Gently and slowly bend your fingers down and inward until the tips of your fingers are touching the tops of your palm. 3. Hold this position for a few seconds. 4. Extend your fingers out to their original position, all pointing straight up again. Finger fan Repeat this exercise 5-10 times with each hand. 6. Hold your arm and hand out in front of you. Keep your wrist straight. 7. Squeeze your  hand into a fist. 8. Hold this position for a few seconds. 9. Fan out, or spread apart, your hand and fingers as much as possible, stretching every joint fully. Tabletop Repeat this exercise 5-10 times with each hand. 8. Stand or sit with your arm, hand, and all five fingers pointed straight up. Make sure your wrist is straight. 9. Gently and slowly bend your fingers at the knuckles where they meet the hand until your hand is making an upside-down L shape. Your fingers should form a tabletop. 10. Hold this position for a few seconds. 11. Extend your fingers out to their original position, all pointing straight up again. Making Os Repeat this exercise 5-10 times with each hand. 6. Stand or sit with your arm, hand, and all five fingers pointed straight up. Make sure your wrist is straight. 7. Make an O shape by touching your pointer finger to your thumb. Hold for a few seconds. Then open your hand wide. 8. Repeat this motion with each finger on your hand. Table spread Repeat this exercise 5-10 times with each hand. 6. Place your hand on a table with your palm facing down. Make sure your wrist is straight. 7. Spread your fingers out as much as possible. Hold this position for a few seconds. 8. Slide your fingers back together again. Hold for a few seconds. Ball grip Repeat this exercise 10-15 times with each hand. 8. Hold a tennis ball or another soft ball in your hand. 9. While slowly increasing pressure, squeeze the ball as hard as possible. 10. Squeeze as hard as you can for 3-5 seconds. 11. Relax and repeat.  Wrist curls Repeat this exercise 10-15 times with each hand. 6. Sit in a chair that has armrests. 7. Hold a light weight in your hand, such as a dumbbell that weighs 1-3 pounds (0.5-1.4 kg). Ask your health care provider what weight would be best for you. 8. Rest your hand just over the end of the chair arm with your palm facing up. 9. Gently pivot your wrist up and down while  holding the weight. Do not twist your wrist from side to side. Contact a health care provider if:  Your hand pain or discomfort gets much worse when you do an exercise.  Your hand pain or discomfort does not improve within 2 hours after you exercise. If you have any of these problems, stop doing these exercises right away. Do not do them again unless your health care provider says that you can. Get  help right away if:  You develop sudden, severe hand pain. If this happens, stop doing these exercises right away. Do not do them again unless your health care provider says that you can. This information is not intended to replace advice given to you by your health care provider. Make sure you discuss any questions you have with your health care provider. Document Released: 03/29/2015 Document Revised: 08/21/2017 Document Reviewed: 10/26/2014 Elsevier Interactive Patient Education  2019 [employer]

## 2018-07-05 ENCOUNTER — Other Ambulatory Visit: Payer: Self-pay | Admitting: *Deleted

## 2018-07-05 NOTE — Progress Notes (Signed)
Infusion orders are current for patient CBC CMP Tylenol Benadryl appointments are up to date and follow up appointment  is scheduled TB gold not due yet.  

## 2018-07-22 ENCOUNTER — Encounter (HOSPITAL_COMMUNITY): Payer: Medicare Other

## 2018-07-24 ENCOUNTER — Other Ambulatory Visit: Payer: Self-pay

## 2018-07-24 ENCOUNTER — Ambulatory Visit (HOSPITAL_COMMUNITY)
Admission: RE | Admit: 2018-07-24 | Discharge: 2018-07-24 | Disposition: A | Discharge status: Home / Self Care | Payer: Medicare Other | Source: Ambulatory Visit | Attending: Rheumatology | Admitting: Rheumatology

## 2018-07-24 DIAGNOSIS — M0579 Rheumatoid arthritis with rheumatoid factor of multiple sites without organ or systems involvement: Secondary | ICD-10-CM

## 2018-07-24 MED ORDER — DIPHENHYDRAMINE HCL 25 MG PO CAPS: 25.0000 mg | ORAL_CAPSULE | ORAL | Status: DC

## 2018-07-24 MED ORDER — ACETAMINOPHEN 325 MG PO TABS: 650.0000 mg | ORAL_TABLET | ORAL | Status: DC

## 2018-07-24 MED ORDER — SODIUM CHLORIDE 0.9 % IV SOLN
6.0000 mg/kg | INTRAVENOUS | Status: DC
  Filled 2018-07-24: qty 50

## 2018-07-24 NOTE — Progress Notes (Signed)
Patient receive MMR vaccine on 07/08/18. Dr Estanislado Pandy notified. Patient to hold Remicade for 2 months per MD. Pt verbalizes understanding and will call for appointment.

## 2018-08-08 ENCOUNTER — Telehealth: Payer: Self-pay | Admitting: Rheumatology

## 2018-08-08 NOTE — Telephone Encounter (Signed)
Patient declined the prednisone prescription. Patient states she will just take tylenol.

## 2018-08-08 NOTE — Telephone Encounter (Signed)
Prednisone starting at 20 mg decreasing by 5 mg every 4 days per Sherron Ales, PA-C

## 2018-08-08 NOTE — Telephone Encounter (Signed)
Patient called stating she had her MMR Vaccination in March and had to cancel her Remicaide injection.  Patient was told she would have to wait until May before having her next Remicaide injection.  Patient is requesting a return call to discuss options due to having pain.

## 2018-08-08 NOTE — Telephone Encounter (Signed)
Patient states she is having aches and pain in her hands, wrist fingers , hip, knees and feet. Patient states she is having some swelling in her wrist and fingers. Patient was unable to get her Remicade injection in March as she had just had a MMR vaccine.

## 2018-08-20 ENCOUNTER — Encounter: Payer: Self-pay | Admitting: Rheumatology

## 2018-08-20 ENCOUNTER — Telehealth (INDEPENDENT_AMBULATORY_CARE_PROVIDER_SITE_OTHER): Payer: Medicare Other | Admitting: Rheumatology

## 2018-08-20 ENCOUNTER — Telehealth: Payer: Self-pay | Admitting: Rheumatology

## 2018-08-20 ENCOUNTER — Other Ambulatory Visit: Payer: Self-pay

## 2018-08-20 DIAGNOSIS — M0579 Rheumatoid arthritis with rheumatoid factor of multiple sites without organ or systems involvement: Secondary | ICD-10-CM

## 2018-08-20 DIAGNOSIS — M19042 Primary osteoarthritis, left hand: Secondary | ICD-10-CM

## 2018-08-20 DIAGNOSIS — M19041 Primary osteoarthritis, right hand: Secondary | ICD-10-CM

## 2018-08-20 DIAGNOSIS — M170 Bilateral primary osteoarthritis of knee: Secondary | ICD-10-CM

## 2018-08-20 DIAGNOSIS — H15003 Unspecified scleritis, bilateral: Secondary | ICD-10-CM

## 2018-08-20 DIAGNOSIS — Z8679 Personal history of other diseases of the circulatory system: Secondary | ICD-10-CM

## 2018-08-20 DIAGNOSIS — M5136 Other intervertebral disc degeneration, lumbar region: Secondary | ICD-10-CM

## 2018-08-20 DIAGNOSIS — Z79899 Other long term (current) drug therapy: Secondary | ICD-10-CM

## 2018-08-20 DIAGNOSIS — M5030 Other cervical disc degeneration, unspecified cervical region: Secondary | ICD-10-CM

## 2018-08-20 DIAGNOSIS — Z8639 Personal history of other endocrine, nutritional and metabolic disease: Secondary | ICD-10-CM

## 2018-08-20 MED ORDER — PREDNISONE 5 MG PO TABS: ORAL_TABLET | ORAL | 0 refills | Status: DC

## 2018-08-20 NOTE — Progress Notes (Signed)
Virtual Visit via Telephone Note  I connected with Wendy Woods on 08/20/18 at  9:05 AM EDT by telephone from my office and verified that I am speaking with the correct person using two identifiers.Patient was at her home.   I discussed the limitations, risks, security and privacy concerns of performing an evaluation and management service by telephone and the availability of in person appointments. I also discussed with the patient that there may be a patient responsible charge related to this service. The patient expressed understanding and agreed to proceed.  CC:  Pain in multiple joints   History of Present Illness: Patient is a 58 year old female with a past medical history of seropositive rheumatoid arthritis, osteoarthritis, and DDD.  She is receiving Remicade infusions 6 mg/kg every 6 weeks, Otrexup 20 mg sq every week, and folic acid 2 mg po daily.  She is not scheduled for her next Remicade infusion in mid-May.  She had to postpone the infusion due to receiving the MMR vaccine.  She is having a RA flare.  She is having increasing pain and swelling in both hands and both knee joints.  She is having bilateral hip pain.  She has tenderness in both feet if she walks for a prolonged time.  She has had some irritation in both eyes and has noticed some redness.   She rates her RA a 8/10.   Review of Systems  Constitutional: Negative for fever and malaise/fatigue.  HENT:       +Mouth dryness   Eyes: Positive for redness. Negative for photophobia, pain and discharge.       +Dry eyes   Respiratory: Negative for cough, shortness of breath and wheezing.   Cardiovascular: Negative for chest pain and palpitations.  Gastrointestinal: Negative for blood in stool, constipation and diarrhea.  Genitourinary: Negative for dysuria.  Musculoskeletal: Positive for joint pain. Negative for back pain, myalgias and neck pain.       +Joint swelling +Morning stiffness   Skin: Negative for rash.   Neurological: Negative for dizziness and headaches.  Psychiatric/Behavioral: Negative for depression. The patient is not nervous/anxious and does not have insomnia.       Observations/Objective: Physical Exam  Constitutional: She is oriented to person, place, and time.  Neurological: She is alert and oriented to person, place, and time.  Psychiatric: Mood, memory, affect and judgment normal.   Patient reports morning stiffness for 10 minutes.   Patient reports nocturnal pain.  Difficulty dressing/grooming: Denies Difficulty climbing stairs: Reports Difficulty getting out of chair: Reports Difficulty using hands for taps, buttons, cutlery, and/or writing: Denies  Assessment and Plan: Rheumatoid arthritis involving multiple sites with positive rheumatoid factor (HCC) - + RF, Anti-CCP +, ANA -: She is currently having a rheumatoid arthritis flare.  She has pain and swelling in both hands and both knee joints.  She has tenderness of both feet but no joint swelling.  Her last Remicade infusion was on 06/10/18.  She continues to inject MTX 0.3 ml sq once weekly and take folic acid 1 mg po daily.  She received a MMR vaccine was on 07/08/18 and was advised by Dr. Estanislado Pandy to wait 2 months before scheduling next Remicade infusion.  She has been having increased pain and stiffness since she had to postpone her infusion.   We will send in a prednisone taper starting at 20 mg and she will taper by 5 mg every week.  She will continue on MTX 0.3 ml sq once weekly  and folic acid 1 mg po daily.  She was advised to notify us if she continues to have increased pain and swelling after completing the taper. She will follow up in 3 months.   High risk medication use - Remicade 6 mg/kg every 6 weeks, MTX 0.3 ml sq once weekly, folic acid 1 mg po daily.  Her last infusion was on 06/10/18. TB gold negative 12/24/2017.  CBC and CMP were drawn on 06/10/18.  She will be due for lab work in May and every 3 months.     Primary osteoarthritis of both hands: She is having pain and swelling in both hands, more severe in the right hand.  She has incomplete fist formation of the right hand.   Primary osteoarthritis of both knees: She had left knee synvisc injections January 2020 which provided significant relief.  She is having pain and swelling in the right knee joint today.    DDD (degenerative disc disease), cervical:She has no neck pain at this time.  DDD (degenerative disc disease), lumbar: She is not experiencing any lower back pain.   Bilateral scleritis:  She had to postpone her infusion of Remicade until after 09/07/18 due to receiving the MMR vaccine on 07/08/18.  She has noticed some increased eye dryness and redness.  She will be starting on a prednisone taper today.   Trochanteric bursitis, left hip: She has discomfort when laying on her left side at night.  She was encouraged to perform stretching exercises.   Follow Up Instructions: She will follow up in 3 months.   Prednisone taper was sent to the pharmacy.    I discussed the assessment and treatment plan with the patient. The patient was provided an opportunity to ask questions and all were answered. The patient agreed with the plan and demonstrated an understanding of the instructions.   The patient was advised to call back or seek an in-person evaluation if the symptoms worsen or if the condition fails to improve as anticipated.  I provided 25 minutes of non-face-to-face time during this encounter.  Bo Merino, MD   Scribed by-  Ofilia Neas, PA-C

## 2018-08-20 NOTE — Telephone Encounter (Signed)
Patient calling to let you know she needs a RX for Prednisone called into [facility] on [address]

## 2018-08-20 NOTE — Telephone Encounter (Signed)
Patient is scheduled for a virtual visit today with Dr. Corliss Skains to discuss the flare she is having.

## 2018-08-30 ENCOUNTER — Other Ambulatory Visit: Payer: Self-pay | Admitting: *Deleted

## 2018-08-30 DIAGNOSIS — M0579 Rheumatoid arthritis with rheumatoid factor of multiple sites without organ or systems involvement: Secondary | ICD-10-CM

## 2018-09-04 ENCOUNTER — Other Ambulatory Visit: Payer: Self-pay

## 2018-09-04 ENCOUNTER — Encounter (HOSPITAL_COMMUNITY)
Admission: RE | Admit: 2018-09-04 | Discharge: 2018-09-04 | Disposition: A | Discharge status: Home / Self Care | Payer: Medicare Other | Source: Ambulatory Visit | Attending: Rheumatology | Admitting: Rheumatology

## 2018-09-04 DIAGNOSIS — M0579 Rheumatoid arthritis with rheumatoid factor of multiple sites without organ or systems involvement: Secondary | ICD-10-CM | POA: Diagnosis present

## 2018-09-04 LAB — CBC
HCT: 40.2 % (ref 36.0–46.0)
Hemoglobin: 13.1 g/dL (ref 12.0–15.0)
MCH: 30 pg (ref 26.0–34.0)
MCHC: 32.6 g/dL (ref 30.0–36.0)
MCV: 92.2 fL (ref 80.0–100.0)
Platelets: 418 10*3/uL — ABNORMAL HIGH (ref 150–400)
RBC: 4.36 MIL/uL (ref 3.87–5.11)
RDW: 13.9 % (ref 11.5–15.5)
WBC: 9.2 10*3/uL (ref 4.0–10.5)
nRBC: 0 % (ref 0.0–0.2)

## 2018-09-04 LAB — COMPREHENSIVE METABOLIC PANEL
ALT: 20 U/L (ref 0–44)
AST: 18 U/L (ref 15–41)
Albumin: 3.4 g/dL — ABNORMAL LOW (ref 3.5–5.0)
Alkaline Phosphatase: 69 U/L (ref 38–126)
Anion gap: 10 (ref 5–15)
BUN: 18 mg/dL (ref 6–20)
CO2: 31 mmol/L (ref 22–32)
Calcium: 9.5 mg/dL (ref 8.9–10.3)
Chloride: 95 mmol/L — ABNORMAL LOW (ref 98–111)
Creatinine, Ser: 1.04 mg/dL — ABNORMAL HIGH (ref 0.44–1.00)
GFR calc Af Amer: >60 mL/min (ref >60)
GFR calc non Af Amer: 60 mL/min — ABNORMAL LOW (ref >60)
Glucose, Bld: 214 mg/dL — ABNORMAL HIGH (ref 70–99)
Potassium: 3.6 mmol/L (ref 3.5–5.1)
Sodium: 136 mmol/L (ref 135–145)
Total Bilirubin: 0.7 mg/dL (ref 0.3–1.2)
Total Protein: 8.1 g/dL (ref 6.5–8.1)

## 2018-09-04 MED ORDER — SODIUM CHLORIDE 0.9 % IV SOLN
6.0000 mg/kg | INTRAVENOUS | Status: DC
  Administered 2018-09-04: 500 mg via INTRAVENOUS
  Filled 2018-09-04: qty 50

## 2018-09-04 MED ORDER — ACETAMINOPHEN 325 MG PO TABS
ORAL_TABLET | ORAL | Status: AC
  Filled 2018-09-04: qty 2

## 2018-09-04 MED ORDER — DIPHENHYDRAMINE HCL 25 MG PO CAPS
ORAL_CAPSULE | ORAL | Status: AC
  Administered 2018-09-04: 08:00:00 25 mg
  Filled 2018-09-04: qty 1

## 2018-09-04 MED ORDER — ACETAMINOPHEN 325 MG PO TABS
650.0000 mg | ORAL_TABLET | ORAL | Status: DC
  Administered 2018-09-04: 650 mg via ORAL

## 2018-09-04 MED ORDER — DIPHENHYDRAMINE HCL 25 MG PO CAPS: 25.0000 mg | ORAL_CAPSULE | ORAL | Status: DC

## 2018-09-04 NOTE — Progress Notes (Signed)
Stable. Glu is high. Please fax labs to her PCP

## 2018-10-09 ENCOUNTER — Other Ambulatory Visit: Payer: Self-pay | Admitting: *Deleted

## 2018-10-09 NOTE — Progress Notes (Signed)
Infusion orders are current for patient CBC CMP Tylenol Benadryl appointments are up to date and follow up appointment  is scheduled TB gold not due yet.  

## 2018-10-16 ENCOUNTER — Other Ambulatory Visit: Payer: Self-pay

## 2018-10-16 ENCOUNTER — Ambulatory Visit (HOSPITAL_COMMUNITY)
Admission: RE | Admit: 2018-10-16 | Discharge: 2018-10-16 | Disposition: A | Discharge status: Home / Self Care | Payer: Medicare Other | Source: Ambulatory Visit | Attending: Rheumatology | Admitting: Rheumatology

## 2018-10-16 DIAGNOSIS — M0579 Rheumatoid arthritis with rheumatoid factor of multiple sites without organ or systems involvement: Secondary | ICD-10-CM | POA: Diagnosis present

## 2018-10-16 MED ORDER — SODIUM CHLORIDE 0.9 % IV SOLN
6.0000 mg/kg | INTRAVENOUS | Status: DC
  Administered 2018-10-16: 500 mg via INTRAVENOUS
  Filled 2018-10-16: qty 50

## 2018-10-16 MED ORDER — DIPHENHYDRAMINE HCL 25 MG PO CAPS
25.0000 mg | ORAL_CAPSULE | ORAL | Status: DC
  Administered 2018-10-16: 25 mg via ORAL

## 2018-10-16 MED ORDER — ACETAMINOPHEN 325 MG PO TABS
ORAL_TABLET | ORAL | Status: AC
  Filled 2018-10-16: qty 2

## 2018-10-16 MED ORDER — DIPHENHYDRAMINE HCL 25 MG PO CAPS
ORAL_CAPSULE | ORAL | Status: AC
  Filled 2018-10-16: qty 1

## 2018-10-16 MED ORDER — ACETAMINOPHEN 325 MG PO TABS
650.0000 mg | ORAL_TABLET | ORAL | Status: DC
  Administered 2018-10-16: 650 mg via ORAL

## 2018-11-04 NOTE — Progress Notes (Deleted)
Office Visit Note  Patient: Wendy Woods             Date of Birth: Apr 27, 1961           MRN: [id number]             PCP: Fleet Contras, MD Referring: Fleet Contras, MD Visit Date: 11/15/2018 Occupation: @GUAROCC @  Subjective:  No chief complaint on file.   History of Present Illness: Wendy Woods is a 58 y.o. female ***   Activities of Daily Living:  Patient reports morning stiffness for *** {minute/hour:19697}.   Patient {ACTIONS;DENIES/REPORTS:21021675::"Denies"} nocturnal pain.  Difficulty dressing/grooming: {ACTIONS;DENIES/REPORTS:21021675::"Denies"} Difficulty climbing stairs: {ACTIONS;DENIES/REPORTS:21021675::"Denies"} Difficulty getting out of chair: {ACTIONS;DENIES/REPORTS:21021675::"Denies"} Difficulty using hands for taps, buttons, cutlery, and/or writing: {ACTIONS;DENIES/REPORTS:21021675::"Denies"}  No Rheumatology ROS completed.   PMFS History:  Patient Active Problem List   Diagnosis Date Noted  . Unilateral primary osteoarthritis, left knee 01/10/2018  . Chronic pain of left knee 01/10/2018  . DDD (degenerative disc disease), cervical 03/22/2016  . DDD (degenerative disc disease), lumbar 03/22/2016  . 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 Angioedema 07/15/2014  . Hypokalemia 07/15/2014  . Diabetes mellitus, controlled (HCC) 07/03/2012  . HTN (hypertension) 07/03/2012  . Rheumatoid arthritis (HCC) 07/03/2012  . Current chronic use of systemic steroids 07/03/2012  . 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 DDD (degenerative disc disease), cervical 03/22/2016  . DDD (degenerative disc disease), lumbar 03/22/2016  . GERD (gastroesophageal reflux disease)   . Hyperlipidemia   . Hypertension   . Rheumatoid arthritis (HCC)   . Rheumatoid arthritis(714.0)   . 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 Type II diabetes mellitus (HCC)     Family History  Problem Relation Age of Onset  . Colon polyps Father   . Prostate cancer Father   .  Breast cancer Cousin 26  . Colon cancer Neg Hx    Past Surgical History:  Procedure Laterality Date  . APPENDECTOMY  1968  . CATARACT EXTRACTION W/ INTRAOCULAR LENS  IMPLANT, BILATERAL Bilateral 11/2013-12/2013  . KNEE ARTHROPLASTY    . KNEE ARTHROSCOPY Left 1980's-1990's X 3  . MYOMECTOMY  1990's   Social History   Social History Narrative  . Not on file    There is no immunization history on file for this patient.   Objective: Vital Signs: LMP 07/09/2014    Physical Exam   Musculoskeletal Exam: ***  CDAI Exam: CDAI Score: - Patient Global: -; Provider Global: - Swollen: -; Tender: - Joint Exam   No joint exam has been documented for this visit   There is currently no information documented on the homunculus. Go to the Rheumatology activity and complete the homunculus joint exam.  Investigation: No additional findings.  Imaging: No results found.  Recent Labs: Lab Results  Component Value Date   WBC 9.2 09/04/2018   HGB 13.1 09/04/2018   PLT 418 (H) 09/04/2018   NA 136 09/04/2018   K 3.6 09/04/2018   CL 95 (L) 09/04/2018   CO2 31 09/04/2018   GLUCOSE 214 (H) 09/04/2018   BUN 18 09/04/2018   CREATININE 1.04 (H) 09/04/2018   BILITOT 0.7 09/04/2018   ALKPHOS 69 09/04/2018   AST 18 09/04/2018   ALT 20 09/04/2018   PROT 8.1 09/04/2018   ALBUMIN 3.4 (L) 09/04/2018   CALCIUM 9.5 09/04/2018   GFRAA >60 09/04/2018   QFTBGOLD Negative 09/18/2016   QFTBGOLDPLUS Negative 12/24/2017    Speciality Comments: Remicade 6mg / kg every 6 weeks  TB gold negative 08/2016  Procedures:  No procedures performed Allergies: Latex, Lisinopril, and Penicillins   Assessment / Plan:     Visit Diagnoses: No diagnosis found.    Other medical conditions are listed as follows:   Essential hypertension  History of diabetes mellitus, type II  Ganglion cyst of dorsum of left wrist   Orders: No orders of the defined types were placed in this encounter.  No orders of  the defined types were placed in this encounter.   Face-to-face time spent with patient was *** minutes. Greater than 50% of time was spent in counseling and coordination of care.  Follow-Up Instructions: No follow-ups on file.   Earnestine Mealing, CMA  Note - This record has been created using Editor, commissioning.  Chart creation errors have been sought, but may not always  have been located. Such creation errors do not reflect on  the standard of medical care.

## 2018-11-07 ENCOUNTER — Other Ambulatory Visit: Payer: Self-pay | Admitting: *Deleted

## 2018-11-07 NOTE — Progress Notes (Signed)
Infusion orders are current for patient CBC CMP Tylenol Benadryl appointments are up to date and follow up appointment  is scheduled TB gold not due yet.  

## 2018-11-07 NOTE — Progress Notes (Signed)
Office Visit Note  Patient: Wendy Woods             Date of Birth: 12-24-1960           MRN: [id number]             PCP: Nolene Ebbs, MD Referring: Nolene Ebbs, MD Visit Date: 11/12/2018 Occupation: '@GUAROCC' @  Subjective:  Pain in multiple joints     History of Present Illness: Wendy Woods is a 58 y.o. female with history of seropositive rheumatoid arthritis, bilateral scleritis, and osteoarthritis.  She is on Remicade IV infusions 6 mg/kg every 6 weeks, MTX 0.3 ml sq once weekly, and folic acid 1 mg po daily.  Her last infusion was on 10/16/18.  She was started on a prednisone taper in April 2020 after having to postpone her remicade infusion.  She reports her joint pain and inflammation subsided while on prednisone.  She states she is currently having pain in both hands, both knee joints, and the left foot.  She states she has intermittent swelling in both hands, left wrist, and both knee joints.  She states she experiences increased pain and stiffness about 2 weeks prior to her infusions.  She could not tolerate higher dose of MTX in the past.  She states she has been having increased pain in both eyes and intermittent sensitivity to light.  She denies any floaters. She states she is overdue for a follow up appointment with opthalmology.    Activities of Daily Living:  Patient reports morning stiffness for 10  minutes.   Patient Denies nocturnal pain.  Difficulty dressing/grooming: Denies Difficulty climbing stairs: Denies Difficulty getting out of chair: Denies Difficulty using hands for taps, buttons, cutlery, and/or writing: Reports  Review of Systems  Constitutional: Negative for fatigue.  HENT: Negative for mouth sores, mouth dryness and nose dryness.   Eyes: Positive for pain. Negative for visual disturbance and dryness.  Respiratory: Negative for cough, hemoptysis, shortness of breath and difficulty breathing.   Cardiovascular: Negative for chest pain, palpitations,  hypertension and swelling in legs/feet.  Gastrointestinal: Negative for blood in stool, constipation and diarrhea.  Endocrine: Negative for increased urination.  Genitourinary: Negative for painful urination.  Musculoskeletal: Positive for arthralgias, joint pain, joint swelling and morning stiffness. Negative for myalgias, muscle weakness, muscle tenderness and myalgias.  Skin: Negative for color change, pallor, rash, hair loss, nodules/bumps, skin tightness, ulcers and sensitivity to sunlight.  Allergic/Immunologic: Negative for susceptible to infections.  Neurological: Negative for dizziness, numbness, headaches and weakness.  Hematological: Negative for swollen glands.  Psychiatric/Behavioral: Negative for depressed mood and sleep disturbance. The patient is not nervous/anxious.     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 07/03/2012    Past Medical History:  Diagnosis Date   DDD (degenerative disc disease), cervical 03/22/2016   DDD (degenerative disc disease), lumbar 03/22/2016   GERD (gastroesophageal reflux disease)    Hyperlipidemia    Hypertension    Rheumatoid arthritis ([place])    Rheumatoid arthritis(714.0)  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   Type II diabetes mellitus ([place])     Family History  Problem Relation Age of Onset   Colon polyps Father    Prostate cancer Father    Breast cancer Cousin 26   Colon cancer Neg Hx    Past Surgical History:    Procedure Laterality Date   APPENDECTOMY  1968   CATARACT EXTRACTION W/ INTRAOCULAR LENS  IMPLANT, BILATERAL Bilateral 11/2013-12/2013   KNEE ARTHROPLASTY     KNEE ARTHROSCOPY Left 1980's-1990's X 3   MYOMECTOMY  1990's   Social History   Social History Narrative   Not on file    There is no immunization history on file for this patient.   Objective: Vital Signs: BP 133/73 (BP Location: Left Arm, Patient Position: Sitting, Cuff Size: Normal)    Pulse 61    Resp 13    Ht '5\' 4"'  (1.626 m)    Wt 168 lb 6.4 oz (76.4 kg)    LMP 07/09/2014    BMI 28.91 kg/m    Physical Exam Vitals signs and nursing note reviewed.  Constitutional:      Appearance: She is well-developed.  HENT:     Head: Normocephalic and atraumatic.  Eyes:     Conjunctiva/sclera: Conjunctivae normal.  Neck:     Musculoskeletal: Normal range of motion.  Cardiovascular:     Rate and Rhythm: Normal rate and regular rhythm.     Heart sounds: Normal heart sounds.  Pulmonary:     Effort: Pulmonary effort is normal.     Breath sounds: Normal breath sounds.  Abdominal:     General: Bowel sounds are normal.     Palpations: Abdomen is soft.  Lymphadenopathy:     Cervical: No cervical adenopathy.  Skin:    General: Skin is warm and dry.     Capillary Refill: Capillary refill takes less than 2 seconds.  Neurological:     Mental Status: She is alert and oriented to person, place, and time.  Psychiatric:        Behavior: Behavior normal.      Musculoskeletal Exam: C-spine good ROM with some discomfort with lateral rotation.  Thoracic and lumbar spine good ROM.  No midline spinal tenderness.  No SI joint tenderness.  Shoulder joints good ROM with some discomfort.  Elbow joints good ROM with no tenderness or swelling.  Tenderness of the left wrist joint.  Tenderness of MCP joints as described below.  Hip joints good ROM.  Knee joints good ROM with no warmth or effusion.  Tenderness on the lateral aspect of the left  ankle joint.  Tenderness of the left 1st and 5th MTP joints.     CDAI Exam: CDAI Score: 8.2  Patient Global: 6 mm; Provider Global: 6 mm Swollen: 0 ; Tender: 10  Joint Exam      Right  Left  Wrist   Tender   Tender  MCP 2      Tender  MCP 3      Tender  MCP 5      Tender  Knee   Tender   Tender  Subtalar      Tender  MTP 1      Tender  MTP 5      Tender     Investigation: No additional findings.  Imaging: Xr Foot 2 Views Left  Result Date: 11/12/2018 First MTP, PIP and DIP narrowing was noted.  Juxta-articular osteopenia was noted.  No erosive changes were noted.  No  intertarsal joint space narrowing was noted.  No tibiotalar joint space narrowing was noted.  No radiographic progression was noted. Impression: These findings are consistent with osteoarthritis and rheumatoid arthritis overlap.  Xr Foot 2 Views Right  Result Date: 11/12/2018 First MTP, PIP and DIP narrowing was noted.  Juxta-articular osteopenia was noted.  No erosive changes were noted.  No intertarsal joint space narrowing was noted.  No tibiotalar joint space narrowing was noted.  No radiographic progression was noted. Impression: These findings are consistent with osteoarthritis and rheumatoid arthritis overlap.  Xr Hand 2 View Left  Result Date: 11/12/2018 Juxta-articular osteopenia was noted.  Mild PIP and DIP narrowing was noted.  Intercarpal joint space narrowing was noted.  No erosive changes were noted.  No radiographic progression was noted. Impression: These findings are consistent with rheumatoid arthritis and osteoarthritis overlap.  Xr Hand 2 View Right  Result Date: 11/12/2018 Juxta-articular osteopenia was noted.  Mild second and third MCP joint narrowing was noted.  PIP and DIP narrowing was noted.  Intercarpal and radiocarpal joint space narrowing was noted.  No erosive changes were noted.  No radiographic progression was noted. Impression: These findings are consistent with rheumatoid arthritis  and osteoarthritis overlap.   Recent Labs: Lab Results  Component Value Date   WBC 9.2 09/04/2018   HGB 13.1 09/04/2018   PLT 418 (H) 09/04/2018   NA 136 09/04/2018   K 3.6 09/04/2018   CL 95 (L) 09/04/2018   CO2 31 09/04/2018   GLUCOSE 214 (H) 09/04/2018   BUN 18 09/04/2018   CREATININE 1.04 (H) 09/04/2018   BILITOT 0.7 09/04/2018   ALKPHOS 69 09/04/2018   AST 18 09/04/2018   ALT 20 09/04/2018   PROT 8.1 09/04/2018   ALBUMIN 3.4 (L) 09/04/2018   CALCIUM 9.5 09/04/2018   GFRAA >60 09/04/2018   QFTBGOLD Negative 09/18/2016   QFTBGOLDPLUS Negative 12/24/2017    Speciality Comments: Remicade 66m/ kg every 6 weeks TB gold negative 08/2016  Procedures:  No procedures performed Allergies: Latex, Lisinopril, and Penicillins   Assessment / Plan:     Visit Diagnoses: Rheumatoid arthritis involving multiple sites with positive rheumatoid factor (HCC) - + RF, Anti-CCP +, ANA -: She has no obvious synovitis on exam today.  She does have tenderness of multiple MCP joints and bilateral wrist joints.  She has tenderness of bilateral knee joints and the left ankle joint. She has intermittent pain in both hands, left wrist, both knee joints, and both feet.  She has tenderness as described above.  She experiences increased pain and stiffness in multiple joints about 2 weeks prior to her Remicade infusions.  She did postpone her Remicade infusion in April due to receiving the MMR vaccination.  She was given a prednisone taper at that time which resolved her joint pain and joint swelling.  She has resumed Remicade infusions 6 mg/kg grams every 6 weeks and is injecting methotrexate 0.3 mL subcutaneously once weekly.  Her last infusion was on 10/16/2018.  We discussed increasing the frequency of her Remicade infusions to every 5 weeks.  She is in agreement with this plan.  She cannot tolerate a higher dose of methotrexate.  New orders for the Remicade infusion will be placed.  X-rays of both hands and  feet were obtained today to assess for radiographic progression.  There is been no interval change. She was advised to notify [place] develops increased joint pain or joint swelling.  She will follow up in 5 months.  High risk medication use - Remicade IV infusion 6 mg/kg every 6 weeks (last infusion 10/16/2018), methotrexate 0.3 mL every 7 days, and folic acid 1 mg 1 tablet daily.  Last TB gold negative on 12/24/2017 and will monitor yearly. Future order for TB gold was placed today. Most recent CBC/CMP stable on 09/04/2018 and will monitor with every other infusion. She was advised to postpone the Remicade infusion and MTX injection if she develops signs or symptoms of an infection and to resume once the infection has cleared.  The importance of social distancing and following the standard precautions recommended by the CDC were discussed.   - Plan: QuantiFERON-TB Gold Plus  Bilateral scleritis - She has been experiencing intermittent pain and photophobia in both eyes recently. She denies any floaters.  She has intermittent eye redness.  No conjunctival erythema noted today.  She was advised to call her ophthalmologist today to schedule a follow up appointment.   Primary osteoarthritis of both hands: She has PIP and DIP synovial thickening consistent with osteoarthritis.  She has no tenderness at this time. She has complete fist formation bilaterally.  Joint protection and muscle strengthening were discussed.   Primary osteoarthritis of both knees -She has intermittent pain in both knee joints.  No warmth or effusion was noted on exam today.  She had synvisc injections in the left knee joint in January 2020.   Trochanteric bursitis, left hip -Resolved   Ganglion cyst of dorsum of left wrist   DDD (degenerative disc disease), cervical - She has good ROM with some discomfort with lateral rotation.  She has no symptoms of radiculopathy at this time.   DDD (degenerative disc disease), lumbar - She has  intermittent lower back pain dependent on her level of activity.   History of vitamin D deficiency - A future order for vitamin D was placed today. Plan: VITAMIN D 25 Hydroxy (Vit-D Deficiency, Fractures)  Other medical conditions are listed as follows:   Essential hypertension   History of diabetes mellitus, type II    Orders: Orders Placed This Encounter  Procedures   XR Hand 2 View Left   XR Hand 2 View Right   XR Foot 2 Views Right   XR Foot 2 Views Left   QuantiFERON-TB Gold Plus   VITAMIN D 25 Hydroxy (Vit-D Deficiency, Fractures)   Sedimentation rate   No orders of the defined types were placed in this encounter.   Face-to-face time spent with patient was 30  minutes. Greater than 50% of time was spent in counseling and coordination of care.  Follow-Up Instructions: Return in about 3 months (around 02/12/2019) for Rheumatoid arthritis, Osteoarthritis.   Ofilia Neas, PA-C   I examined and evaluated the patient with Hazel Sams PA.  Patient has been experiencing increased pain prior to her infusion.  She had no synovitis on examination but had increased joint tenderness.  X-rays did not show any radiographic progression.  We will decrease the interval between the Remicade infusions to every 5 weeks.  She was also advised to schedule an appointment with her ophthalmologist as soon as possible.  The plan of care was discussed as noted above.  Bo Merino, MD  Note - This record has been created using Editor, commissioning.  Chart creation errors have been sought, but may not always  have been located. Such creation errors do not reflect on  the standard of medical care.

## 2018-11-12 ENCOUNTER — Ambulatory Visit: Payer: Self-pay

## 2018-11-12 ENCOUNTER — Encounter: Payer: Self-pay | Admitting: Rheumatology

## 2018-11-12 ENCOUNTER — Other Ambulatory Visit: Payer: Self-pay

## 2018-11-12 ENCOUNTER — Other Ambulatory Visit: Payer: Self-pay | Admitting: *Deleted

## 2018-11-12 ENCOUNTER — Ambulatory Visit (INDEPENDENT_AMBULATORY_CARE_PROVIDER_SITE_OTHER): Payer: Medicare Other | Admitting: Rheumatology

## 2018-11-12 ENCOUNTER — Telehealth: Payer: Self-pay

## 2018-11-12 VITALS — BP 133/73 | HR 61 | Resp 13 | Ht 64.0 in | Wt 168.4 lb

## 2018-11-12 DIAGNOSIS — M79671 Pain in right foot: Secondary | ICD-10-CM | POA: Diagnosis not present

## 2018-11-12 DIAGNOSIS — M7062 Trochanteric bursitis, left hip: Secondary | ICD-10-CM

## 2018-11-12 DIAGNOSIS — M170 Bilateral primary osteoarthritis of knee: Secondary | ICD-10-CM

## 2018-11-12 DIAGNOSIS — M79641 Pain in right hand: Secondary | ICD-10-CM

## 2018-11-12 DIAGNOSIS — M19041 Primary osteoarthritis, right hand: Secondary | ICD-10-CM | POA: Diagnosis not present

## 2018-11-12 DIAGNOSIS — M79672 Pain in left foot: Secondary | ICD-10-CM

## 2018-11-12 DIAGNOSIS — Z79899 Other long term (current) drug therapy: Secondary | ICD-10-CM

## 2018-11-12 DIAGNOSIS — H15003 Unspecified scleritis, bilateral: Secondary | ICD-10-CM | POA: Diagnosis not present

## 2018-11-12 DIAGNOSIS — M19042 Primary osteoarthritis, left hand: Secondary | ICD-10-CM

## 2018-11-12 DIAGNOSIS — M79642 Pain in left hand: Secondary | ICD-10-CM

## 2018-11-12 DIAGNOSIS — M67432 Ganglion, left wrist: Secondary | ICD-10-CM

## 2018-11-12 DIAGNOSIS — I10 Essential (primary) hypertension: Secondary | ICD-10-CM

## 2018-11-12 DIAGNOSIS — M0579 Rheumatoid arthritis with rheumatoid factor of multiple sites without organ or systems involvement: Secondary | ICD-10-CM

## 2018-11-12 DIAGNOSIS — M5030 Other cervical disc degeneration, unspecified cervical region: Secondary | ICD-10-CM

## 2018-11-12 DIAGNOSIS — M5136 Other intervertebral disc degeneration, lumbar region: Secondary | ICD-10-CM

## 2018-11-12 DIAGNOSIS — Z8639 Personal history of other endocrine, nutritional and metabolic disease: Secondary | ICD-10-CM

## 2018-11-12 NOTE — Telephone Encounter (Signed)
Per Hazel Sams, PA-C, patient's remicade infusions will be 6mg /kg every 5 weeks. Please make changed to order accordingly.

## 2018-11-12 NOTE — Patient Instructions (Signed)
Standing Labs We placed an order today for your standing lab work.    Please come back and get your standing labs in August and every 3 months   TB gold, CBC, CMP, and vitamin D   We have open lab daily Monday through Thursday from 8:30-12:30 PM and 1:30-4:30 PM and Friday from 8:30-12:30 PM and 1:30 -4:00 PM at the office of Dr. Bo Merino.   You may experience shorter wait times on Monday and Friday afternoons. The office is located at [address] [place], [place], [place] [address] No appointment is necessary.   Labs are drawn by [facility].  You may receive a bill from [facility] for your lab work.  If you wish to have your labs drawn at another location, please call the office 24 hours in advance to send orders.  If you have any questions regarding directions or hours of operation,  please call [phone].   Just as a reminder please drink plenty of water prior to coming for your lab work. Thanks!

## 2018-11-14 NOTE — Telephone Encounter (Signed)
Infusion Orders updated.

## 2018-11-14 NOTE — Progress Notes (Signed)
Infusion orders are current for patient CBC CMP Tylenol Benadryl appointments are up to date and follow up appointment  is scheduled TB gold not due yet.  

## 2018-11-14 NOTE — Addendum Note (Signed)
Addended by: Carole Binning on: 11/14/2018 11:22 AM   Modules accepted: Orders

## 2018-11-15 ENCOUNTER — Ambulatory Visit: Payer: Self-pay | Admitting: Physician Assistant

## 2018-11-27 ENCOUNTER — Other Ambulatory Visit: Payer: Self-pay

## 2018-11-27 ENCOUNTER — Ambulatory Visit (HOSPITAL_COMMUNITY)
Admission: RE | Admit: 2018-11-27 | Discharge: 2018-11-27 | Disposition: A | Discharge status: Home / Self Care | Payer: Medicare Other | Source: Ambulatory Visit | Attending: Rheumatology | Admitting: Rheumatology

## 2018-11-27 DIAGNOSIS — M0579 Rheumatoid arthritis with rheumatoid factor of multiple sites without organ or systems involvement: Secondary | ICD-10-CM | POA: Diagnosis not present

## 2018-11-27 LAB — COMPREHENSIVE METABOLIC PANEL
ALT: 16 U/L (ref 0–44)
AST: 23 U/L (ref 15–41)
Albumin: 3.7 g/dL (ref 3.5–5.0)
Alkaline Phosphatase: 64 U/L (ref 38–126)
Anion gap: 12 (ref 5–15)
BUN: 17 mg/dL (ref 6–20)
CO2: 26 mmol/L (ref 22–32)
Calcium: 9.6 mg/dL (ref 8.9–10.3)
Chloride: 97 mmol/L — ABNORMAL LOW (ref 98–111)
Creatinine, Ser: 0.92 mg/dL (ref 0.44–1.00)
GFR calc Af Amer: >60 mL/min (ref >60)
GFR calc non Af Amer: >60 mL/min (ref >60)
Glucose, Bld: 230 mg/dL — ABNORMAL HIGH (ref 70–99)
Potassium: 3.5 mmol/L (ref 3.5–5.1)
Sodium: 135 mmol/L (ref 135–145)
Total Bilirubin: 0.5 mg/dL (ref 0.3–1.2)
Total Protein: 7.9 g/dL (ref 6.5–8.1)

## 2018-11-27 LAB — CBC
HCT: 40.4 % (ref 36.0–46.0)
Hemoglobin: 13.6 g/dL (ref 12.0–15.0)
MCH: 30.2 pg (ref 26.0–34.0)
MCHC: 33.7 g/dL (ref 30.0–36.0)
MCV: 89.6 fL (ref 80.0–100.0)
Platelets: 367 10*3/uL (ref 150–400)
RBC: 4.51 MIL/uL (ref 3.87–5.11)
RDW: 13.3 % (ref 11.5–15.5)
WBC: 7.6 10*3/uL (ref 4.0–10.5)
nRBC: 0 % (ref 0.0–0.2)

## 2018-11-27 MED ORDER — ACETAMINOPHEN 325 MG PO TABS
650.0000 mg | ORAL_TABLET | ORAL | Status: DC
  Administered 2018-11-27: 650 mg via ORAL

## 2018-11-27 MED ORDER — SODIUM CHLORIDE 0.9 % IV SOLN
6.0000 mg/kg | INTRAVENOUS | Status: DC
  Administered 2018-11-27: 500 mg via INTRAVENOUS
  Filled 2018-11-27: qty 50

## 2018-11-27 MED ORDER — DIPHENHYDRAMINE HCL 25 MG PO CAPS
ORAL_CAPSULE | ORAL | Status: AC
  Filled 2018-11-27: qty 1

## 2018-11-27 MED ORDER — DIPHENHYDRAMINE HCL 25 MG PO CAPS
25.0000 mg | ORAL_CAPSULE | ORAL | Status: DC
  Administered 2018-11-27: 25 mg via ORAL

## 2018-11-27 MED ORDER — ACETAMINOPHEN 325 MG PO TABS
ORAL_TABLET | ORAL | Status: AC
  Filled 2018-11-27: qty 2

## 2018-11-27 NOTE — Progress Notes (Signed)
Glucose is elevated.  All other labs are stable.  Please forward a copy of the labs to her PCP.

## 2018-11-27 NOTE — Progress Notes (Signed)
CBC normal

## 2019-01-01 ENCOUNTER — Ambulatory Visit (HOSPITAL_COMMUNITY)
Admission: RE | Admit: 2019-01-01 | Discharge: 2019-01-01 | Disposition: A | Discharge status: Home / Self Care | Payer: Medicare Other | Source: Ambulatory Visit | Attending: Rheumatology | Admitting: Rheumatology

## 2019-01-01 ENCOUNTER — Other Ambulatory Visit: Payer: Self-pay

## 2019-01-01 DIAGNOSIS — M0579 Rheumatoid arthritis with rheumatoid factor of multiple sites without organ or systems involvement: Secondary | ICD-10-CM | POA: Insufficient documentation

## 2019-01-01 LAB — COMPREHENSIVE METABOLIC PANEL
ALT: 22 U/L (ref 0–44)
AST: 22 U/L (ref 15–41)
Albumin: 3.5 g/dL (ref 3.5–5.0)
Alkaline Phosphatase: 54 U/L (ref 38–126)
Anion gap: 11 (ref 5–15)
BUN: 17 mg/dL (ref 6–20)
CO2: 27 mmol/L (ref 22–32)
Calcium: 9.6 mg/dL (ref 8.9–10.3)
Chloride: 99 mmol/L (ref 98–111)
Creatinine, Ser: 0.9 mg/dL (ref 0.44–1.00)
GFR calc Af Amer: >60 mL/min (ref >60)
GFR calc non Af Amer: >60 mL/min (ref >60)
Glucose, Bld: 229 mg/dL — ABNORMAL HIGH (ref 70–99)
Potassium: 3.2 mmol/L — ABNORMAL LOW (ref 3.5–5.1)
Sodium: 137 mmol/L (ref 135–145)
Total Bilirubin: 0.6 mg/dL (ref 0.3–1.2)
Total Protein: 7.7 g/dL (ref 6.5–8.1)

## 2019-01-01 LAB — CBC
HCT: 39 % (ref 36.0–46.0)
Hemoglobin: 12.9 g/dL (ref 12.0–15.0)
MCH: 30.3 pg (ref 26.0–34.0)
MCHC: 33.1 g/dL (ref 30.0–36.0)
MCV: 91.5 fL (ref 80.0–100.0)
Platelets: 398 10*3/uL (ref 150–400)
RBC: 4.26 MIL/uL (ref 3.87–5.11)
RDW: 13.1 % (ref 11.5–15.5)
WBC: 7 10*3/uL (ref 4.0–10.5)
nRBC: 0 % (ref 0.0–0.2)

## 2019-01-01 MED ORDER — DIPHENHYDRAMINE HCL 25 MG PO CAPS
ORAL_CAPSULE | ORAL | Status: AC
  Filled 2019-01-01: qty 1

## 2019-01-01 MED ORDER — SODIUM CHLORIDE 0.9 % IV SOLN
6.0000 mg/kg | INTRAVENOUS | Status: DC
  Administered 2019-01-01: 09:00:00 500 mg via INTRAVENOUS
  Filled 2019-01-01: qty 50

## 2019-01-01 MED ORDER — DIPHENHYDRAMINE HCL 25 MG PO CAPS
25.0000 mg | ORAL_CAPSULE | ORAL | Status: DC
  Administered 2019-01-01: 25 mg via ORAL

## 2019-01-01 MED ORDER — ACETAMINOPHEN 325 MG PO TABS
650.0000 mg | ORAL_TABLET | ORAL | Status: DC
  Administered 2019-01-01: 650 mg via ORAL

## 2019-01-01 MED ORDER — ACETAMINOPHEN 325 MG PO TABS
ORAL_TABLET | ORAL | Status: AC
  Filled 2019-01-01: qty 2

## 2019-01-01 NOTE — Progress Notes (Signed)
Potassium is low.  Please forward the lab results to PCP and notify patient.

## 2019-01-08 ENCOUNTER — Encounter (HOSPITAL_COMMUNITY): Payer: Medicare Other

## 2019-01-31 ENCOUNTER — Other Ambulatory Visit: Payer: Self-pay | Admitting: *Deleted

## 2019-01-31 NOTE — Progress Notes (Signed)
Infusion orders are current for patient CBC CMP Tylenol Benadryl appointments are up to date and follow up appointment  is scheduled TB gold due and order placed.  

## 2019-02-12 ENCOUNTER — Other Ambulatory Visit: Payer: Self-pay

## 2019-02-12 ENCOUNTER — Encounter (HOSPITAL_COMMUNITY)
Admission: RE | Admit: 2019-02-12 | Discharge: 2019-02-12 | Disposition: A | Discharge status: Home / Self Care | Payer: Medicare Other | Source: Ambulatory Visit | Attending: Rheumatology | Admitting: Rheumatology

## 2019-02-12 DIAGNOSIS — M0579 Rheumatoid arthritis with rheumatoid factor of multiple sites without organ or systems involvement: Secondary | ICD-10-CM

## 2019-02-12 MED ORDER — DIPHENHYDRAMINE HCL 25 MG PO CAPS
25.0000 mg | ORAL_CAPSULE | ORAL | Status: DC
  Administered 2019-02-12: 25 mg via ORAL

## 2019-02-12 MED ORDER — SODIUM CHLORIDE 0.9 % IV SOLN
6.0000 mg/kg | INTRAVENOUS | Status: DC
  Administered 2019-02-12: 500 mg via INTRAVENOUS
  Filled 2019-02-12: qty 50

## 2019-02-12 MED ORDER — ACETAMINOPHEN 325 MG PO TABS
ORAL_TABLET | ORAL | Status: AC
  Filled 2019-02-12: qty 2

## 2019-02-12 MED ORDER — DIPHENHYDRAMINE HCL 25 MG PO CAPS
ORAL_CAPSULE | ORAL | Status: AC
  Filled 2019-02-12: qty 1

## 2019-02-12 MED ORDER — ACETAMINOPHEN 325 MG PO TABS
650.0000 mg | ORAL_TABLET | ORAL | Status: DC
  Administered 2019-02-12: 650 mg via ORAL

## 2019-02-14 LAB — QUANTIFERON-TB GOLD PLUS (RQFGPL)
QuantiFERON Mitogen Value: 1.31 IU/mL
QuantiFERON Nil Value: 0.02 IU/mL
QuantiFERON TB1 Ag Value: 0.02 IU/mL
QuantiFERON TB2 Ag Value: 0.02 IU/mL

## 2019-02-14 LAB — QUANTIFERON-TB GOLD PLUS: QuantiFERON-TB Gold Plus: NEGATIVE

## 2019-03-26 ENCOUNTER — Encounter (HOSPITAL_COMMUNITY): Payer: Medicare Other

## 2019-04-03 ENCOUNTER — Ambulatory Visit (HOSPITAL_COMMUNITY)
Admission: RE | Admit: 2019-04-03 | Discharge: 2019-04-03 | Disposition: A | Discharge status: Home / Self Care | Payer: Medicare Other | Source: Ambulatory Visit | Attending: Rheumatology | Admitting: Rheumatology

## 2019-04-03 ENCOUNTER — Other Ambulatory Visit: Payer: Self-pay

## 2019-04-03 DIAGNOSIS — M0579 Rheumatoid arthritis with rheumatoid factor of multiple sites without organ or systems involvement: Secondary | ICD-10-CM | POA: Insufficient documentation

## 2019-04-03 LAB — COMPREHENSIVE METABOLIC PANEL
ALT: 18 U/L (ref 0–44)
AST: 19 U/L (ref 15–41)
Albumin: 3.6 g/dL (ref 3.5–5.0)
Alkaline Phosphatase: 60 U/L (ref 38–126)
Anion gap: 10 (ref 5–15)
BUN: 15 mg/dL (ref 6–20)
CO2: 28 mmol/L (ref 22–32)
Calcium: 9.9 mg/dL (ref 8.9–10.3)
Chloride: 99 mmol/L (ref 98–111)
Creatinine, Ser: 0.96 mg/dL (ref 0.44–1.00)
GFR calc Af Amer: >60 mL/min (ref >60)
GFR calc non Af Amer: >60 mL/min (ref >60)
Glucose, Bld: 147 mg/dL — ABNORMAL HIGH (ref 70–99)
Potassium: 3.4 mmol/L — ABNORMAL LOW (ref 3.5–5.1)
Sodium: 137 mmol/L (ref 135–145)
Total Bilirubin: 0.5 mg/dL (ref 0.3–1.2)
Total Protein: 7.7 g/dL (ref 6.5–8.1)

## 2019-04-03 LAB — CBC
HCT: 39.8 % (ref 36.0–46.0)
Hemoglobin: 12.9 g/dL (ref 12.0–15.0)
MCH: 29.3 pg (ref 26.0–34.0)
MCHC: 32.4 g/dL (ref 30.0–36.0)
MCV: 90.5 fL (ref 80.0–100.0)
Platelets: 379 10*3/uL (ref 150–400)
RBC: 4.4 MIL/uL (ref 3.87–5.11)
RDW: 13.2 % (ref 11.5–15.5)
WBC: 6.3 10*3/uL (ref 4.0–10.5)
nRBC: 0 % (ref 0.0–0.2)

## 2019-04-03 MED ORDER — ACETAMINOPHEN 325 MG PO TABS
650.0000 mg | ORAL_TABLET | ORAL | Status: DC
  Administered 2019-04-03: 650 mg via ORAL

## 2019-04-03 MED ORDER — DIPHENHYDRAMINE HCL 25 MG PO CAPS
25.0000 mg | ORAL_CAPSULE | ORAL | Status: DC
  Administered 2019-04-03: 09:00:00 25 mg via ORAL

## 2019-04-03 MED ORDER — DIPHENHYDRAMINE HCL 25 MG PO CAPS
ORAL_CAPSULE | ORAL | Status: AC
  Administered 2019-04-03: 25 mg via ORAL
  Filled 2019-04-03: qty 1

## 2019-04-03 MED ORDER — SODIUM CHLORIDE 0.9 % IV SOLN
6.0000 mg/kg | INTRAVENOUS | Status: DC
  Administered 2019-04-03: 500 mg via INTRAVENOUS
  Filled 2019-04-03: qty 50

## 2019-04-03 MED ORDER — ACETAMINOPHEN 325 MG PO TABS
ORAL_TABLET | ORAL | Status: AC
  Filled 2019-04-03: qty 2

## 2019-04-04 ENCOUNTER — Encounter (HOSPITAL_COMMUNITY): Payer: Medicare Other

## 2019-04-14 ENCOUNTER — Inpatient Hospital Stay (HOSPITAL_COMMUNITY): Admission: RE | Admit: 2019-04-14 | Payer: Medicare Other | Source: Ambulatory Visit

## 2019-04-15 NOTE — Progress Notes (Signed)
Virtual Visit via Telephone Note  I connected with Wendy Woods on 04/16/19 at 10:15 AM EST by telephone and verified that I am speaking with the correct person using two identifiers.  Location: Patient: Home  Provider: Clinic  This service was conducted via virtual visit. The patient was located at home. I was located in my office.  Consent was obtained prior to the virtual visit and is aware of possible charges through their insurance for this visit.  The patient is an established patient.  Dr. Corliss Skains, MD conducted the virtual visit and Sherron Ales, PA-C acted as scribe during the service.  Office staff helped with scheduling follow up visits after the service was conducted.     I discussed the limitations, risks, security and privacy concerns of performing an evaluation and management service by telephone and the availability of in person appointments. I also discussed with the patient that there may be a patient responsible charge related to this service. The patient expressed understanding and agreed to proceed.  CC: Pain in left knee joint  History of Present Illness: Patient is a 58 year old female with a past medical history of seropositive rheumatoid arthritis, osteoarthritis, and DDD.  She is on Remicade infusions every 5 weeks and MTX 0.3 ml sq once weekly. She has noticed any improvement since increasing the frequency of remicade from every 6 weeks to every 5 weeks. She has not had any recent rheumatoid arthritis flares.  She has intermittent pain in the left knee joint but no joint swelling. She has occasional pain in both feet, but she remains active.  She has chronic neck and lower back pain and stiffness.    Review of Systems  Constitutional: Positive for malaise/fatigue. Negative for fever.  Eyes: Negative for photophobia, pain, discharge and redness.  Respiratory: Negative for cough, shortness of breath and wheezing.   Cardiovascular: Negative for chest pain and palpitations.   Gastrointestinal: Negative for blood in stool, constipation and diarrhea.  Genitourinary: Negative for dysuria.  Musculoskeletal: Positive for back pain, joint pain and neck pain. Negative for myalgias.       +Morning stiffness   Skin: Negative for rash.  Neurological: Negative for dizziness and headaches.  Psychiatric/Behavioral: Negative for depression. The patient is not nervous/anxious and does not have insomnia.       Observations/Objective: Physical Exam  Constitutional: She is oriented to person, place, and time.  Neurological: She is alert and oriented to person, place, and time.  Psychiatric: Mood, memory, affect and judgment normal.    Patient reports morning stiffness for 20 minutes.   Patient denies nocturnal pain.  Difficulty dressing/grooming: Denies Difficulty climbing stairs: Denies Difficulty getting out of chair: Denies Difficulty using hands for taps, buttons, cutlery, and/or writing: Denies   Assessment and Plan: Visit Diagnoses: Rheumatoid arthritis involving multiple sites with positive rheumatoid factor (HCC) - + RF, Anti-CCP +, ANA -:  She has not had any recent rheumatoid arthritis flares.  She is receiving Remicade infusions 6 mg/kg grams every 5 weeks and is injecting methotrexate 0.3 mL subcutaneously once weekly. She has noticed improvement since increasing the frequency of remicade infusions from every 6 weeks to every 5 weeks.  She has intermittent pain in the left knee joint and both feet.  No inflammation at this time.  X-rays of both hands and feet were obtained at the last visit did not reveal interval change.  She will continue on the current treatment regimen. She was advised to notify us if she  develops increased joint pain or joint swelling.  She will follow up in 3 months.   High risk medication use - Remicade IV infusion 6 mg/kg every 5 weeks, methotrexate 0.3 mL every 7 days, and folic acid 1 mg 1 tablet daily.  Last TB gold negative on  02/12/19 and will monitor yearly. CBC and CMP were drawn on 04/02/29.    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    Primary osteoarthritis of both hands: She is not having any joint pain or inflammation in her hands at this time.   Primary osteoarthritis of both knees -She is experiencing left knee joint pain.  No joint swelling currently.   Trochanteric bursitis, left hip -She has intermittent left trochanteric bursitis. She was encouraged to perform stretching exercises.  Ganglion cyst of dorsum of left wrist   DDD (degenerative disc disease), cervical -She has chronic neck pain and stiffness.    DDD (degenerative disc disease), lumbar - She has chronic lower back pain.  She has no symptoms of radiculopathy at this time.   Other medical conditions are listed as follows:   History of vitamin D deficiency   Essential hypertension   History of diabetes mellitus, type II   Follow Up Instructions: She will follow up in 3 months    I discussed the assessment and treatment plan with the patient. The patient was provided an opportunity to ask questions and all were answered. The patient agreed with the plan and demonstrated an understanding of the instructions.   The patient was advised to call back or seek an in-person evaluation if the symptoms worsen or if the condition fails to improve as anticipated.  I provided 15 minutes of non-face-to-face time during this encounter.   Bo Merino, MD   Scribed by-  Hazel Sams, PA-C

## 2019-04-16 ENCOUNTER — Other Ambulatory Visit: Payer: Self-pay

## 2019-04-16 ENCOUNTER — Telehealth (INDEPENDENT_AMBULATORY_CARE_PROVIDER_SITE_OTHER): Payer: Medicare Other | Admitting: Rheumatology

## 2019-04-16 ENCOUNTER — Encounter: Payer: Self-pay | Admitting: Rheumatology

## 2019-04-16 DIAGNOSIS — M67432 Ganglion, left wrist: Secondary | ICD-10-CM

## 2019-04-16 DIAGNOSIS — M51369 Other intervertebral disc degeneration, lumbar region without mention of lumbar back pain or lower extremity pain: Secondary | ICD-10-CM

## 2019-04-16 DIAGNOSIS — M19041 Primary osteoarthritis, right hand: Secondary | ICD-10-CM | POA: Diagnosis not present

## 2019-04-16 DIAGNOSIS — I10 Essential (primary) hypertension: Secondary | ICD-10-CM

## 2019-04-16 DIAGNOSIS — Z79899 Other long term (current) drug therapy: Secondary | ICD-10-CM | POA: Diagnosis not present

## 2019-04-16 DIAGNOSIS — H15003 Unspecified scleritis, bilateral: Secondary | ICD-10-CM | POA: Diagnosis not present

## 2019-04-16 DIAGNOSIS — M19042 Primary osteoarthritis, left hand: Secondary | ICD-10-CM

## 2019-04-16 DIAGNOSIS — M0579 Rheumatoid arthritis with rheumatoid factor of multiple sites without organ or systems involvement: Secondary | ICD-10-CM | POA: Diagnosis not present

## 2019-04-16 DIAGNOSIS — M5030 Other cervical disc degeneration, unspecified cervical region: Secondary | ICD-10-CM

## 2019-04-16 DIAGNOSIS — M170 Bilateral primary osteoarthritis of knee: Secondary | ICD-10-CM

## 2019-04-16 DIAGNOSIS — M5136 Other intervertebral disc degeneration, lumbar region: Secondary | ICD-10-CM

## 2019-04-16 DIAGNOSIS — M7062 Trochanteric bursitis, left hip: Secondary | ICD-10-CM

## 2019-04-16 DIAGNOSIS — Z8639 Personal history of other endocrine, nutritional and metabolic disease: Secondary | ICD-10-CM

## 2019-04-23 ENCOUNTER — Telehealth: Payer: Self-pay | Admitting: Rheumatology

## 2019-04-23 NOTE — Telephone Encounter (Signed)
-----   Message from Shona Needles, RT sent at 04/16/2019  2:53 PM EST ----- Regarding: 3 MONTH F/U

## 2019-04-23 NOTE — Telephone Encounter (Signed)
LMOM for patient to call and schedule 3 month follow-up appointment. °

## 2019-05-14 ENCOUNTER — Inpatient Hospital Stay (HOSPITAL_COMMUNITY): Admission: RE | Admit: 2019-05-14 | Payer: Medicare Other | Source: Ambulatory Visit

## 2019-05-15 ENCOUNTER — Encounter (HOSPITAL_COMMUNITY): Payer: Medicare Other

## 2019-05-20 ENCOUNTER — Encounter (HOSPITAL_COMMUNITY)
Admission: RE | Admit: 2019-05-20 | Discharge: 2019-05-20 | Disposition: A | Discharge status: Home / Self Care | Payer: Medicare Other | Source: Ambulatory Visit | Attending: Rheumatology | Admitting: Rheumatology

## 2019-05-20 ENCOUNTER — Other Ambulatory Visit: Payer: Self-pay

## 2019-05-20 DIAGNOSIS — M0579 Rheumatoid arthritis with rheumatoid factor of multiple sites without organ or systems involvement: Secondary | ICD-10-CM | POA: Diagnosis not present

## 2019-05-20 MED ORDER — ACETAMINOPHEN 325 MG PO TABS: 650.0000 mg | ORAL_TABLET | ORAL | Status: DC

## 2019-05-20 MED ORDER — ACETAMINOPHEN 325 MG PO TABS
ORAL_TABLET | ORAL | Status: AC
  Filled 2019-05-20: qty 2

## 2019-05-20 MED ORDER — DIPHENHYDRAMINE HCL 25 MG PO CAPS
ORAL_CAPSULE | ORAL | Status: AC
  Administered 2019-05-20: 25 mg via ORAL
  Filled 2019-05-20: qty 1

## 2019-05-20 MED ORDER — ACETAMINOPHEN 325 MG PO TABS
ORAL_TABLET | ORAL | Status: AC
  Administered 2019-05-20: 650 mg via ORAL
  Filled 2019-05-20: qty 2

## 2019-05-20 MED ORDER — DIPHENHYDRAMINE HCL 25 MG PO CAPS: 25.0000 mg | ORAL_CAPSULE | ORAL | Status: DC

## 2019-05-20 MED ORDER — SODIUM CHLORIDE 0.9 % IV SOLN
6.0000 mg/kg | INTRAVENOUS | Status: DC
  Administered 2019-05-20: 09:00:00 500 mg via INTRAVENOUS
  Filled 2019-05-20: qty 50

## 2019-05-20 MED ORDER — DIPHENHYDRAMINE HCL 25 MG PO CAPS
ORAL_CAPSULE | ORAL | Status: AC
  Filled 2019-05-20: qty 1

## 2019-06-10 ENCOUNTER — Other Ambulatory Visit: Payer: Self-pay | Admitting: *Deleted

## 2019-06-10 NOTE — Progress Notes (Signed)
Infusion orders are current for patient CBC CMP Tylenol Benadryl appointments are up to date and follow up appointment  is scheduled TB gold not due yet.  

## 2019-07-01 ENCOUNTER — Encounter (HOSPITAL_COMMUNITY): Payer: Medicare Other

## 2019-07-03 ENCOUNTER — Other Ambulatory Visit: Payer: Self-pay

## 2019-07-03 ENCOUNTER — Ambulatory Visit (HOSPITAL_COMMUNITY)
Admission: RE | Admit: 2019-07-03 | Discharge: 2019-07-03 | Disposition: A | Discharge status: Home / Self Care | Payer: Medicare Other | Source: Ambulatory Visit | Attending: Rheumatology | Admitting: Rheumatology

## 2019-07-03 DIAGNOSIS — M0579 Rheumatoid arthritis with rheumatoid factor of multiple sites without organ or systems involvement: Secondary | ICD-10-CM | POA: Insufficient documentation

## 2019-07-03 LAB — COMPREHENSIVE METABOLIC PANEL
ALT: 19 U/L (ref 0–44)
AST: 21 U/L (ref 15–41)
Albumin: 3.5 g/dL (ref 3.5–5.0)
Alkaline Phosphatase: 58 U/L (ref 38–126)
Anion gap: 11 (ref 5–15)
BUN: 14 mg/dL (ref 6–20)
CO2: 30 mmol/L (ref 22–32)
Calcium: 9.7 mg/dL (ref 8.9–10.3)
Chloride: 96 mmol/L — ABNORMAL LOW (ref 98–111)
Creatinine, Ser: 0.89 mg/dL (ref 0.44–1.00)
GFR calc Af Amer: >60 mL/min (ref >60)
GFR calc non Af Amer: >60 mL/min (ref >60)
Glucose, Bld: 178 mg/dL — ABNORMAL HIGH (ref 70–99)
Potassium: 3.5 mmol/L (ref 3.5–5.1)
Sodium: 137 mmol/L (ref 135–145)
Total Bilirubin: 0.5 mg/dL (ref 0.3–1.2)
Total Protein: 7.9 g/dL (ref 6.5–8.1)

## 2019-07-03 LAB — CBC
HCT: 39.9 % (ref 36.0–46.0)
Hemoglobin: 13 g/dL (ref 12.0–15.0)
MCH: 29.2 pg (ref 26.0–34.0)
MCHC: 32.6 g/dL (ref 30.0–36.0)
MCV: 89.7 fL (ref 80.0–100.0)
Platelets: 359 10*3/uL (ref 150–400)
RBC: 4.45 MIL/uL (ref 3.87–5.11)
RDW: 13 % (ref 11.5–15.5)
WBC: 5.1 10*3/uL (ref 4.0–10.5)
nRBC: 0 % (ref 0.0–0.2)

## 2019-07-03 MED ORDER — ACETAMINOPHEN 325 MG PO TABS: 650.0000 mg | ORAL_TABLET | ORAL | Status: DC

## 2019-07-03 MED ORDER — SODIUM CHLORIDE 0.9 % IV SOLN
6.0000 mg/kg | INTRAVENOUS | Status: DC
  Administered 2019-07-03: 500 mg via INTRAVENOUS
  Filled 2019-07-03: qty 50

## 2019-07-03 MED ORDER — DIPHENHYDRAMINE HCL 25 MG PO CAPS: 25.0000 mg | ORAL_CAPSULE | ORAL | Status: DC

## 2019-07-03 NOTE — Progress Notes (Signed)
Glucose is elevated.  Please notify patient and forward labs to her PCP.

## 2019-07-03 NOTE — Progress Notes (Signed)
CBC is normal.

## 2019-07-31 ENCOUNTER — Other Ambulatory Visit: Payer: Self-pay | Admitting: *Deleted

## 2019-07-31 NOTE — Progress Notes (Signed)
Infusion orders are current for patient CBC CMP Tylenol Benadryl appointments are up to date and follow up appointment  is scheduled TB gold not due yet.  

## 2019-08-06 ENCOUNTER — Telehealth: Payer: Self-pay | Admitting: Rheumatology

## 2019-08-06 NOTE — Telephone Encounter (Signed)
-----   Message from Henriette Combs, LPN sent at 08/29/256  4:58 PM EDT ----- Please schedule patient for a follow up visit. Patient was due March 2021. Thanks!

## 2019-08-06 NOTE — Telephone Encounter (Signed)
LMOM for patient to call and schedule follow-up appointment.   °

## 2019-08-13 ENCOUNTER — Ambulatory Visit (HOSPITAL_COMMUNITY)
Admission: RE | Admit: 2019-08-13 | Discharge: 2019-08-13 | Disposition: A | Discharge status: Home / Self Care | Payer: Medicare Other | Source: Ambulatory Visit | Attending: Rheumatology | Admitting: Rheumatology

## 2019-08-13 ENCOUNTER — Other Ambulatory Visit: Payer: Self-pay

## 2019-08-13 DIAGNOSIS — M0579 Rheumatoid arthritis with rheumatoid factor of multiple sites without organ or systems involvement: Secondary | ICD-10-CM | POA: Diagnosis not present

## 2019-08-13 MED ORDER — ACETAMINOPHEN 325 MG PO TABS: 650.0000 mg | ORAL_TABLET | ORAL | Status: DC

## 2019-08-13 MED ORDER — DIPHENHYDRAMINE HCL 25 MG PO CAPS: 25.0000 mg | ORAL_CAPSULE | ORAL | Status: DC

## 2019-08-13 MED ORDER — SODIUM CHLORIDE 0.9 % IV SOLN
6.0000 mg/kg | INTRAVENOUS | Status: DC
  Administered 2019-08-13: 500 mg via INTRAVENOUS
  Filled 2019-08-13 (×2): qty 50

## 2019-08-14 ENCOUNTER — Encounter (HOSPITAL_COMMUNITY): Payer: Medicare Other

## 2019-08-28 NOTE — Progress Notes (Signed)
Office Visit Note  Patient: Wendy Woods             Date of Birth: 02-22-1961           MRN: [id number]             PCP: Fleet Contras, MD Referring: Fleet Contras, MD Visit Date: 09/03/2019 Occupation: @GUAROCC @  Subjective:  Left hip joint pain   History of Present Illness: Wendy Woods is a 58 y.o. female with history of seropositive rheumatoid arthritis and osteoarthritis.  She has history of bilateral scleritis.  She is on Remicade IV infusions 6 mg/kg  every 5 weeks.  Her last infusion is on 08/13/19.  She discontinued MTX 1 month ago due to GI SE.  She denies any recent infections.  She has received both covid-19 vaccinations.  She denies any recent rheumatoid arthritis flares.  She continues to have discomfort in both wrist joints but denies any joint swelling.  She has been experiencing left hip pain for the past 1 month.  She states that the discomfort is intermittent and occasionally she will feel a catching sensation which is activity related.  She denies any groin pain at this time.  She states that she has pain when laying on her left side at night.  She has occasional discomfort in the left knee joint but denies any joint swelling at this time.  She has been walking for exercise which has been helping with her discomfort and stiffness.  She denies any recent scleritis flares.   Activities of Daily Living:  Patient reports morning stiffness for 10  minutes.   Patient Reports nocturnal pain.  Difficulty dressing/grooming: Denies Difficulty climbing stairs: Denies Difficulty getting out of chair: Denies Difficulty using hands for taps, buttons, cutlery, and/or writing: Denies  Review of Systems  Constitutional: Negative for fatigue.  HENT: Positive for mouth dryness. Negative for mouth sores and nose dryness.   Eyes: Positive for dryness. Negative for pain and visual disturbance.  Respiratory: Negative for cough, hemoptysis, shortness of breath and difficulty breathing.     Cardiovascular: Negative for chest pain, palpitations, hypertension and swelling in legs/feet.  Gastrointestinal: Negative for blood in stool, constipation and diarrhea.  Endocrine: Negative for increased urination.  Genitourinary: Negative for painful urination.  Musculoskeletal: Positive for arthralgias, joint pain and morning stiffness. Negative for joint swelling, myalgias, muscle weakness, muscle tenderness and myalgias.  Skin: Negative for color change, pallor, rash, hair loss, nodules/bumps, skin tightness, ulcers and sensitivity to sunlight.  Allergic/Immunologic: Negative for susceptible to infections.  Neurological: Negative for dizziness, numbness, headaches and weakness.  Hematological: Negative for swollen glands.  Psychiatric/Behavioral: Negative for depressed mood and sleep disturbance. The patient is not nervous/anxious.     PMFS History:  Patient Active Problem List   Diagnosis Date Noted  . Unilateral primary osteoarthritis, left knee 01/10/2018  . Chronic pain of left knee 01/10/2018  . DDD (degenerative disc disease), cervical 03/22/2016  . DDD (degenerative disc disease), lumbar 03/22/2016  . 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 Angioedema 07/15/2014  . Hypokalemia 07/15/2014  . Diabetes mellitus, controlled (HCC) 07/03/2012  . HTN (hypertension) 07/03/2012  . Rheumatoid arthritis (HCC) 07/03/2012  . Current chronic use of systemic steroids 07/03/2012  . 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 DDD (degenerative disc disease), cervical 03/22/2016  . DDD (degenerative disc disease), lumbar 03/22/2016  . GERD (gastroesophageal reflux disease)   . Hyperlipidemia   . Hypertension   .  Rheumatoid arthritis ([place])   . Rheumatoid arthritis(714.0)   . 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 Type II diabetes mellitus (HCC)     Family History  Problem Relation Age of Onset  . Colon polyps Father   . Prostate cancer Father   . Breast cancer Cousin 26   . Colon cancer Neg Hx    Past Surgical History:  Procedure Laterality Date  . APPENDECTOMY  1968  . CATARACT EXTRACTION W/ INTRAOCULAR LENS  IMPLANT, BILATERAL Bilateral 11/2013-12/2013  . KNEE ARTHROPLASTY    . KNEE ARTHROSCOPY Left 1980's-1990's X 3  . MYOMECTOMY  1990's   Social History   Social History Narrative  . Not on file    There is no immunization history on file for this patient.   Objective: Vital Signs: BP 133/76 (BP Location: Right Arm, Patient Position: Sitting, Cuff Size: Normal)   Pulse (!) 56   Resp 16   Ht 5\' 4"  (1.626 m)   Wt 177 lb 3.2 oz (80.4 kg)   LMP 07/09/2014   BMI 30.42 kg/m    Physical Exam Vitals and nursing note reviewed.  Constitutional:      Appearance: She is well-developed.  HENT:     Head: Normocephalic and atraumatic.  Eyes:     Conjunctiva/sclera: Conjunctivae normal.  Pulmonary:     Effort: Pulmonary effort is normal.  Abdominal:     General: Bowel sounds are normal.     Palpations: Abdomen is soft.  Musculoskeletal:     Cervical back: Normal range of motion.  Lymphadenopathy:     Cervical: No cervical adenopathy.  Skin:    General: Skin is warm and dry.     Capillary Refill: Capillary refill takes less than 2 seconds.  Neurological:     Mental Status: She is alert and oriented to person, place, and time.  Psychiatric:        Behavior: Behavior normal.      Musculoskeletal Exam: C-spine limited lateral rotation bilaterally.  Thoracic and lumbar spine good ROM.  No midline spinal tenderness.  Left SI joint tenderness.  Shoulder joints and elbow joints good ROM with no discomfort.  Limited ROM of both wrist joints.  Synovial thickening of both wrist joints.  She has tenderness of the left wrist but no synovitis was noted.  She has tenderness of bilateral second and third MCP joints.  She has complete fist formation bilaterally.  Hip joints have good range of motion with no discomfort.  She has tenderness over the left  trochanteric bursa.  Knee joints have good range of motion with no warmth or effusion.  Ankle joints have good range of motion no tenderness or inflammation.  CDAI Exam: CDAI Score: 6  Patient Global: 5 mm; Provider Global: 5 mm Swollen: 0 ; Tender: 5  Joint Exam 09/03/2019      Right  Left  Wrist      Tender  MCP 2   Tender   Tender  MCP 3   Tender   Tender     Investigation: No additional findings.  Imaging: No results found.  Recent Labs: Lab Results  Component Value Date   WBC 5.1 07/03/2019   HGB 13.0 07/03/2019   PLT 359 07/03/2019   NA 137 07/03/2019   K 3.5 07/03/2019   CL 96 (L) 07/03/2019   CO2 30 07/03/2019   GLUCOSE 178 (H) 07/03/2019   BUN 14 07/03/2019   CREATININE 0.89 07/03/2019   BILITOT 0.5 07/03/2019   ALKPHOS 58 07/03/2019  AST 21 07/03/2019   ALT 19 07/03/2019   PROT 7.9 07/03/2019   ALBUMIN 3.5 07/03/2019   CALCIUM 9.7 07/03/2019   GFRAA >60 07/03/2019   QFTBGOLD Negative 09/18/2016   QFTBGOLDPLUS Negative 02/12/2019    Speciality Comments: Remicade 6mg / kg every 6 weeks TB gold negative 08/2016  Procedures:  No procedures performed Allergies: Latex, Lisinopril, Penicillins, and Methotrexate derivatives   Assessment / Plan:     Visit Diagnoses: Rheumatoid arthritis involving multiple sites with positive rheumatoid factor (HCC) -  + RF, Anti-CCP +, ANA -: She has no synovitis on exam today.  She has chronic pain in both wrist joints.  Synovial thickening and limited range of motion in both wrist joints were noted.  She has tenderness of the left wrist on exam.  Tenderness of bilateral second and third MCPs but no synovitis noted.  She has not had any recent rheumatoid arthritis flares.  She is clinically doing well on Remicade IV infusion 6 mg/kg every 5 weeks.  She discontinued injectable methotrexate about 1 month ago due to experiencing GI side effects.  We discussed the importance of staying on methotrexate.  She will resume methotrexate  0.3 mL subcutaneous injections once weekly and folic acid 1 mg by mouth daily.  She will continue receiving Remicade IV infusion 6 mg/kg every 5 weeks.  She was advised to notify 09/2016 if she develops increased joint pain or joint swelling. She will follow-up in the office in 5 months.   High risk medication use - Remicade IV infusion 6 mg/kg every 5 weeks.  Discontinue methotrexate due to GI side effects.  She could not tolerate oral or injectable methotrexate.  Last TB gold negative on 02/12/19 and we will continue to monitor yearly.  CBC and CMP were drawn on 07/03/2019.  She will be due to update lab work in June and every 3 months.  She has not had any recent infections.  She has received both COVID-19 vaccinations.  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  She has chronic eye dryness but has not noticed any photophobia or eye pain recently.  No conjunctival injection was noted on exam.  Primary osteoarthritis of both hands: She has PIP and DIP thickening consistent with osteoarthritis of both hands.  She has no tenderness or synovitis.  She has complete fist formation bilaterally.  Joint protection and muscle strengthening were discussed.  Primary osteoarthritis of both knees: She has good ROM of both knee joints on exam.  No warmth or effusion noted.  She has intermittent discomfort in both knee joints, especially the left knee joint.  Her discomfort and stiffness has been improving since walking more for exercise.  We discussed the importance of lower extremity muscle strengthening.  She is given a handout of knee joint exercises to perform.  Referral to physical therapy replaced today.  Trochanteric bursitis, left hip: She has tenderness over the left trochanteric bursa on exam.  She has been experiencing intermittent discomfort for the past 1 month.  She has tenderness along the proximal IT band of the left hip.  She has good range of motion of the left hip joint on exam  today.  She is not experiencing any groin pain.  She experiences discomfort when laying on her left side at night.  X-rays of the left hip were obtained today.  She declined a left trochanteric bursa cortisone injection.  She would like a referral to physical therapy.  She was also given a  handout of home exercises to perform.  Pain in left hip -She has been experiencing increased left hip pain for the past 1 month.  Her symptoms are due to left trochanter bursitis.  She is not experiencing any groin pain at this time.  X-rays of the left hip were obtained today.  She will be referred to physical therapy and was encouraged to perform home exercises for bursitis and IT band syndrome.  Plan: XR HIP UNILAT W OR W/O PELVIS 2-3 VIEWS LEFT  Ganglion cyst of dorsum of left wrist: Resolved   DDD (degenerative disc disease), cervical:  She has limited lateral rotation bilaterally.  She has no symptoms of radiculopathy at this time.  She was encouraged to perform neck exercises.  DDD (degenerative disc disease), lumbar: She is good range of motion of the lumbar spine.  No midline spinal tenderness.  She experiences intermittent discomfort in her lower back.  She would like a referral to physical therapy for aquatic exercises.  Other medical conditions are listed as follows:  Essential hypertension  History of diabetes mellitus, type II  History of vitamin D deficiency    Orders: Orders Placed This Encounter  Procedures  . XR HIP UNILAT W OR W/O PELVIS 2-3 VIEWS LEFT   No orders of the defined types were placed in this encounter.   Face-to-face time spent with patient was 30 minutes. Greater than 50% of time was spent in counseling and coordination of care.  Follow-Up Instructions: Return in about 5 months (around 02/03/2020) for Rheumatoid arthritis, Osteoarthritis.   Gearldine Bienenstock, PA-C  Note - This record has been created using Dragon software.  Chart creation errors have been sought, but  may not always  have been located. Such creation errors do not reflect on  the standard of medical care.

## 2019-09-03 ENCOUNTER — Other Ambulatory Visit: Payer: Self-pay

## 2019-09-03 ENCOUNTER — Encounter: Payer: Self-pay | Admitting: Physician Assistant

## 2019-09-03 ENCOUNTER — Ambulatory Visit: Payer: Self-pay

## 2019-09-03 ENCOUNTER — Ambulatory Visit (INDEPENDENT_AMBULATORY_CARE_PROVIDER_SITE_OTHER): Payer: Medicare Other | Admitting: Physician Assistant

## 2019-09-03 VITALS — BP 133/76 | HR 56 | Resp 16 | Ht 64.0 in | Wt 177.2 lb

## 2019-09-03 DIAGNOSIS — M5136 Other intervertebral disc degeneration, lumbar region: Secondary | ICD-10-CM

## 2019-09-03 DIAGNOSIS — H15003 Unspecified scleritis, bilateral: Secondary | ICD-10-CM | POA: Diagnosis not present

## 2019-09-03 DIAGNOSIS — M67432 Ganglion, left wrist: Secondary | ICD-10-CM

## 2019-09-03 DIAGNOSIS — Z8639 Personal history of other endocrine, nutritional and metabolic disease: Secondary | ICD-10-CM

## 2019-09-03 DIAGNOSIS — M19041 Primary osteoarthritis, right hand: Secondary | ICD-10-CM | POA: Diagnosis not present

## 2019-09-03 DIAGNOSIS — Z79899 Other long term (current) drug therapy: Secondary | ICD-10-CM

## 2019-09-03 DIAGNOSIS — M5030 Other cervical disc degeneration, unspecified cervical region: Secondary | ICD-10-CM

## 2019-09-03 DIAGNOSIS — M170 Bilateral primary osteoarthritis of knee: Secondary | ICD-10-CM

## 2019-09-03 DIAGNOSIS — M0579 Rheumatoid arthritis with rheumatoid factor of multiple sites without organ or systems involvement: Secondary | ICD-10-CM | POA: Diagnosis not present

## 2019-09-03 DIAGNOSIS — I10 Essential (primary) hypertension: Secondary | ICD-10-CM

## 2019-09-03 DIAGNOSIS — M7062 Trochanteric bursitis, left hip: Secondary | ICD-10-CM

## 2019-09-03 DIAGNOSIS — M25552 Pain in left hip: Secondary | ICD-10-CM | POA: Diagnosis not present

## 2019-09-03 DIAGNOSIS — M19042 Primary osteoarthritis, left hand: Secondary | ICD-10-CM

## 2019-09-03 MED ORDER — ONDANSETRON 4 MG PO TBDP: 4.0000 mg | ORAL_TABLET | Freq: Three times a day (TID) | ORAL | 0 refills | Status: DC | PRN

## 2019-09-03 NOTE — Patient Instructions (Addendum)
Standing Labs We placed an order today for your standing lab work.    Please come back and get your standing labs in June and every 3 months   We have open lab daily Monday through Thursday from 8:30-12:30 PM and 1:30-4:30 PM and Friday from 8:30-12:30 PM and 1:30-4:00 PM at the office of Dr. Pollyann Savoy.   You may experience shorter wait times on Monday and Friday afternoons. The office is located at [address], [address], [place], [name] [address] No appointment is necessary.   Labs are drawn by [facility].  You may receive a bill from [facility] for your lab work.  If you wish to have your labs drawn at another location, please call the office 24 hours in advance to send orders.  If you have any questions regarding directions or hours of operation,  please call [phone].   Just as a reminder please drink plenty of water prior to coming for your lab work. Thanks!    Journal for Nurse Practitioners, 15(4), [phone]. Retrieved February 04, 2018 from [email or web address].com/[email or web address]">  Knee Exercises Ask your health care provider which exercises are safe for you. Do exercises exactly as told by your health care provider and adjust them as directed. It is normal to feel mild stretching, pulling, tightness, or discomfort as you do these exercises. Stop right away if you feel sudden pain or your pain gets worse. Do not begin these exercises until told by your health care provider. Stretching and range-of-motion exercises These exercises warm up your muscles and joints and improve the movement and flexibility of your knee. These exercises also help to relieve pain and swelling. Knee extension, prone 1. Lie on your abdomen (prone position) on a bed. 2. Place your left / right knee just beyond the edge of the surface so your knee is not on the bed. You can put a towel under your left / right thigh just above your kneecap for comfort. 3. Relax your leg muscles and allow gravity to straighten  your knee (extension). You should feel a stretch behind your left / right knee. 4. Hold this position for __________ seconds. 5. Scoot up so your knee is supported between repetitions. Repeat __________ times. Complete this exercise __________ times a day. Knee flexion, active  1. Lie on your back with both legs straight. If this causes back discomfort, bend your left / right knee so your foot is flat on the floor. 2. Slowly slide your left / right heel back toward your buttocks. Stop when you feel a gentle stretch in the front of your knee or thigh (flexion). 3. Hold this position for __________ seconds. 4. Slowly slide your left / right heel back to the starting position. Repeat __________ times. Complete this exercise __________ times a day. Quadriceps stretch, prone  1. Lie on your abdomen on a firm surface, such as a bed or padded floor. 2. Bend your left / right knee and hold your ankle. If you cannot reach your ankle or pant leg, loop a belt around your foot and grab the belt instead. 3. Gently pull your heel toward your buttocks. Your knee should not slide out to the side. You should feel a stretch in the front of your thigh and knee (quadriceps). 4. Hold this position for __________ seconds. Repeat __________ times. Complete this exercise __________ times a day. Hamstring, supine 1. Lie on your back (supine position). 2. Loop a belt or towel over the ball of your left / right foot. The  ball of your foot is on the walking surface, right under your toes. 3. Straighten your left / right knee and slowly pull on the belt to raise your leg until you feel a gentle stretch behind your knee (hamstring). ? Do not let your knee bend while you do this. ? Keep your other leg flat on the floor. 4. Hold this position for __________ seconds. Repeat __________ times. Complete this exercise __________ times a day. Strengthening exercises These exercises build strength and endurance in your knee.  Endurance is the ability to use your muscles for a long time, even after they get tired. Quadriceps, isometric This exercise stretches the muscles in front of your thigh (quadriceps) without moving your knee joint (isometric). 1. Lie on your back with your left / right leg extended and your other knee bent. Put a rolled towel or small pillow under your knee if told by your health care provider. 2. Slowly tense the muscles in the front of your left / right thigh. You should see your kneecap slide up toward your hip or see increased dimpling just above the knee. This motion will push the back of the knee toward the floor. 3. For __________ seconds, hold the muscle as tight as you can without increasing your pain. 4. Relax the muscles slowly and completely. Repeat __________ times. Complete this exercise __________ times a day. Straight leg raises This exercise stretches the muscles in front of your thigh (quadriceps) and the muscles that move your hips (hip flexors). 1. Lie on your back with your left / right leg extended and your other knee bent. 2. Tense the muscles in the front of your left / right thigh. You should see your kneecap slide up or see increased dimpling just above the knee. Your thigh may even shake a bit. 3. Keep these muscles tight as you raise your leg 4-6 inches (10-15 cm) off the floor. Do not let your knee bend. 4. Hold this position for __________ seconds. 5. Keep these muscles tense as you lower your leg. 6. Relax your muscles slowly and completely after each repetition. Repeat __________ times. Complete this exercise __________ times a day. Hamstring, isometric 1. Lie on your back on a firm surface. 2. Bend your left / right knee about __________ degrees. 3. Dig your left / right heel into the surface as if you are trying to pull it toward your buttocks. Tighten the muscles in the back of your thighs (hamstring) to "dig" as hard as you can without increasing any  pain. 4. Hold this position for __________ seconds. 5. Release the tension gradually and allow your muscles to relax completely for __________ seconds after each repetition. Repeat __________ times. Complete this exercise __________ times a day. Hamstring curls If told by your health care provider, do this exercise while wearing ankle weights. Begin with __________ lb weights. Then increase the weight by 1 lb (0.5 kg) increments. Do not wear ankle weights that are more than __________ lb. 1. Lie on your abdomen with your legs straight. 2. Bend your left / right knee as far as you can without feeling pain. Keep your hips flat against the floor. 3. Hold this position for __________ seconds. 4. Slowly lower your leg to the starting position. Repeat __________ times. Complete this exercise __________ times a day. Squats This exercise strengthens the muscles in front of your thigh and knee (quadriceps). 1. Stand in front of a table, with your feet and knees pointing straight ahead. You may  rest your hands on the table for balance but not for support. 2. Slowly bend your knees and lower your hips like you are going to sit in a chair. ? Keep your weight over your heels, not over your toes. ? Keep your lower legs upright so they are parallel with the table legs. ? Do not let your hips go lower than your knees. ? Do not bend lower than told by your health care provider. ? If your knee pain increases, do not bend as low. 3. Hold the squat position for __________ seconds. 4. Slowly push with your legs to return to standing. Do not use your hands to pull yourself to standing. Repeat __________ times. Complete this exercise __________ times a day. Wall slides This exercise strengthens the muscles in front of your thigh and knee (quadriceps). 1. Lean your back against a smooth wall or door, and walk your feet out 18-24 inches (46-61 cm) from it. 2. Place your feet hip-width apart. 3. Slowly slide down  the wall or door until your knees bend __________ degrees. Keep your knees over your heels, not over your toes. Keep your knees in line with your hips. 4. Hold this position for __________ seconds. Repeat __________ times. Complete this exercise __________ times a day. Straight leg raises This exercise strengthens the muscles that rotate the leg at the hip and move it away from your body (hip abductors). 1. Lie on your side with your left / right leg in the top position. Lie so your head, shoulder, knee, and hip line up. You may bend your bottom knee to help you keep your balance. 2. Roll your hips slightly forward so your hips are stacked directly over each other and your left / right knee is facing forward. 3. Leading with your heel, lift your top leg 4-6 inches (10-15 cm). You should feel the muscles in your outer hip lifting. ? Do not let your foot drift forward. ? Do not let your knee roll toward the ceiling. 4. Hold this position for __________ seconds. 5. Slowly return your leg to the starting position. 6. Let your muscles relax completely after each repetition. Repeat __________ times. Complete this exercise __________ times a day. Straight leg raises This exercise stretches the muscles that move your hips away from the front of the pelvis (hip extensors). 1. Lie on your abdomen on a firm surface. You can put a pillow under your hips if that is more comfortable. 2. Tense the muscles in your buttocks and lift your left / right leg about 4-6 inches (10-15 cm). Keep your knee straight as you lift your leg. 3. Hold this position for __________ seconds. 4. Slowly lower your leg to the starting position. 5. Let your leg relax completely after each repetition. Repeat __________ times. Complete this exercise __________ times a day. This information is not intended to replace advice given to you by your health care provider. Make sure you discuss any questions you have with your health care  provider. Document Revised: 02/05/2018 Document Reviewed: 02/05/2018 Elsevier Patient Education  Clark Band Syndrome Rehab Ask your health care provider which exercises are safe for you. Do exercises exactly as told by your health care provider and adjust them as directed. It is normal to feel mild stretching, pulling, tightness, or discomfort as you do these exercises. Stop right away if you feel sudden pain or your pain gets significantly worse. Do not begin these exercises until told by your health  care provider. Stretching and range-of-motion exercises These exercises warm up your muscles and joints and improve the movement and flexibility of your hip and pelvis. Quadriceps stretch, prone  6. Lie on your abdomen on a firm surface, such as a bed or padded floor (prone position). 7. Bend your left / right knee and reach back to hold your ankle or pant leg. If you cannot reach your ankle or pant leg, loop a belt around your foot and grab the belt instead. 8. Gently pull your heel toward your buttocks. Your knee should not slide out to the side. You should feel a stretch in the front of your thigh and knee (quadriceps). 9. Hold this position for __________ seconds. Repeat __________ times. Complete this exercise __________ times a day. Iliotibial band stretch An iliotibial band is a strong band of muscle tissue that runs from the outer side of your hip to the outer side of your thigh and knee. 5. Lie on your side with your left / right leg in the top position. 6. Bend both of your knees and grab your left / right ankle. Stretch out your bottom arm to help you balance. 7. Slowly bring your top knee back so your thigh goes behind your trunk. 8. Slowly lower your top leg toward the floor until you feel a gentle stretch on the outside of your left / right hip and thigh. If you do not feel a stretch and your knee will not fall farther, place the heel of your other foot on top  of your knee and pull your knee down toward the floor with your foot. 9. Hold this position for __________ seconds. Repeat __________ times. Complete this exercise __________ times a day. Strengthening exercises These exercises build strength and endurance in your hip and pelvis. Endurance is the ability to use your muscles for a long time, even after they get tired. Straight leg raises, side-lying This exercise strengthens the muscles that rotate the leg at the hip and move it away from your body (hip abductors). 5. Lie on your side with your left / right leg in the top position. Lie so your head, shoulder, hip, and knee line up. You may bend your bottom knee to help you balance. 6. Roll your hips slightly forward so your hips are stacked directly over each other and your left / right knee is facing forward. 7. Tense the muscles in your outer thigh and lift your top leg 4-6 inches (10-15 cm). 8. Hold this position for __________ seconds. 9. Slowly return to the starting position. Let your muscles relax completely before doing another repetition. Repeat __________ times. Complete this exercise __________ times a day. Leg raises, prone This exercise strengthens the muscles that move the hips (hip extensors). 5. Lie on your abdomen on your bed or a firm surface. You can put a pillow under your hips if that is more comfortable for your lower back. 6. Bend your left / right knee so your foot is straight up in the air. 7. Squeeze your buttocks muscles and lift your left / right thigh off the bed. Do not let your back arch. 8. Tense your thigh muscle as hard as you can without increasing any knee pain. 9. Hold this position for __________ seconds. 10. Slowly lower your leg to the starting position and allow it to relax completely. Repeat __________ times. Complete this exercise __________ times a day. Hip hike 5. Stand sideways on a bottom step. Stand on your left /  right leg with your other foot  unsupported next to the step. You can hold on to the railing or wall for balance if needed. 6. Keep your knees straight and your torso square. Then lift your left / right hip up toward the ceiling. 7. Slowly let your left / right hip lower toward the floor, past the starting position. Your foot should get closer to the floor. Do not lean or bend your knees. Repeat __________ times. Complete this exercise __________ times a day. This information is not intended to replace advice given to you by your health care provider. Make sure you discuss any questions you have with your health care provider. Document Revised: 08/08/2018 Document Reviewed: 02/06/2018 Elsevier Patient Education  2020 [employer]  Hip Bursitis Rehab Ask your health care provider which exercises are safe for you. Do exercises exactly as told by your health care provider and adjust them as directed. It is normal to feel mild stretching, pulling, tightness, or discomfort as you do these exercises. Stop right away if you feel sudden pain or your pain gets worse. Do not begin these exercises until told by your health care provider. Stretching exercise This exercise warms up your muscles and joints and improves the movement and flexibility of your hip. This exercise also helps to relieve pain and stiffness. Iliotibial band stretch An iliotibial band is a strong band of muscle tissue that runs from the outer side of your hip to the outer side of your thigh and knee. 1. Lie on your side with your left / right leg in the top position. 2. Bend your left / right knee and grab your ankle. Stretch out your bottom arm to help you balance. 3. Slowly bring your knee back so your thigh is behind your body. 4. Slowly lower your knee toward the floor until you feel a gentle stretch on the outside of your left / right thigh. If you do not feel a stretch and your knee will not fall farther, place the heel of your other foot on top of your knee and  pull your knee down toward the floor with your foot. 5. Hold this position for __________ seconds. 6. Slowly return to the starting position. Repeat __________ times. Complete this exercise __________ times a day. Strengthening exercises These exercises build strength and endurance in your hip and pelvis. Endurance is the ability to use your muscles for a long time, even after they get tired. Bridge This exercise strengthens the muscles that move your thigh backward (hip extensors). 1. Lie on your back on a firm surface with your knees bent and your feet flat on the floor. 2. Tighten your buttocks muscles and lift your buttocks off the floor until your trunk is level with your thighs. ? Do not arch your back. ? You should feel the muscles working in your buttocks and the back of your thighs. If you do not feel these muscles, slide your feet 1-2 inches (2.5-5 cm) farther away from your buttocks. ? If this exercise is too easy, try doing it with your arms crossed over your chest. 3. Hold this position for __________ seconds. 4. Slowly lower your hips to the starting position. 5. Let your muscles relax completely after each repetition. Repeat __________ times. Complete this exercise __________ times a day. Squats This exercise strengthens the muscles in front of your thigh and knee (quadriceps). 1. Stand in front of a table, with your feet and knees pointing straight ahead. You may rest your  hands on the table for balance but not for support. 2. Slowly bend your knees and lower your hips like you are going to sit in a chair. ? Keep your weight over your heels, not over your toes. ? Keep your lower legs upright so they are parallel with the table legs. ? Do not let your hips go lower than your knees. ? Do not bend lower than told by your health care provider. ? If your hip pain increases, do not bend as low. 3. Hold the squat position for __________ seconds. 4. Slowly push with your legs to  return to standing. Do not use your hands to pull yourself to standing. Repeat __________ times. Complete this exercise __________ times a day. Hip hike 1. Stand sideways on a bottom step. Stand on your left / right leg with your other foot unsupported next to the step. You can hold on to the railing or wall for balance if needed. 2. Keep your knees straight and your torso square. Then lift your left / right hip up toward the ceiling. 3. Hold this position for __________ seconds. 4. Slowly let your left / right hip lower toward the floor, past the starting position. Your foot should get closer to the floor. Do not lean or bend your knees. Repeat __________ times. Complete this exercise __________ times a day. Single leg stand 1. Without shoes, stand near a railing or in a doorway. You may hold on to the railing or door frame as needed for balance. 2. Squeeze your left / right buttock muscles, then lift up your other foot. ? Do not let your left / right hip push out to the side. ? It is helpful to stand in front of a mirror for this exercise so you can watch your hip. 3. Hold this position for __________ seconds. Repeat __________ times. Complete this exercise __________ times a day. This information is not intended to replace advice given to you by your health care provider. Make sure you discuss any questions you have with your health care provider. Document Revised: 08/12/2018 Document Reviewed: 08/12/2018 Elsevier Patient Education  2020 [employer].

## 2019-09-03 NOTE — Progress Notes (Signed)
Pharmacy Note  Subjective:   Patient seen by the pharmacist for counseling on methotrexate.  She is on Remicade 6 mg/KG every 6 weeks and discontinued methotrexate1 month ago due to GI upset.  Objective: Current Outpatient Medications on File Prior to Visit  Medication Sig Dispense Refill  . acetaminophen (TYLENOL) 500 MG tablet Take 1,000 mg by mouth every 6 (six) hours as needed for pain.    Marland Kitchen amLODipine (NORVASC) 5 MG tablet Take 1 tablet (5 mg total) by mouth daily. 30 tablet 0  . atorvastatin (LIPITOR) 40 MG tablet Take 40 mg by mouth daily.    . cetirizine (ZYRTEC ALLERGY) 10 MG tablet Take 1 tablet (10 mg total) by mouth daily. 30 tablet 11  . clindamycin-benzoyl peroxide (BENZACLIN) gel as needed.     . famotidine (PEPCID) 20 MG tablet Take 1 tablet (20 mg total) by mouth 2 (two) times daily. (Patient taking differently: Take 20 mg by mouth as needed. ) 6 tablet 0  . folic acid (FOLVITE) 1 MG tablet Take 1 mg by mouth daily.    . hydrochlorothiazide (HYDRODIURIL) 12.5 MG tablet Take 12.5 mg by mouth daily.    Marland Kitchen inFLIXimab (REMICADE) 100 MG injection Inject into the vein.    Marland Kitchen JARDIANCE 25 MG TABS tablet Take 25 mg by mouth daily.    . potassium chloride (K-DUR) 10 MEQ tablet Take 10 mEq by mouth daily.    . pseudoephedrine (SUDAFED) 60 MG tablet Take 1 tablet (60 mg total) by mouth every 8 (eight) hours as needed for congestion. 30 tablet 0  . tretinoin (RETIN-A) 0.025 % cream as needed.     . Canagliflozin-metFORMIN HCl (INVOKAMET PO) Take 300 mg by mouth daily.    . cyclobenzaprine (FLEXERIL) 5 MG tablet Take 1 tablet (5 mg total) at bedtime as needed by mouth for muscle spasms. (Patient not taking: Reported on 09/03/2019) 10 tablet 0  . EPINEPHrine (EPIPEN 2-PAK) 0.3 mg/0.3 mL IJ SOAJ injection Inject 0.3 mLs (0.3 mg total) into the muscle once. (Patient not taking: Reported on 09/03/2019) 1 Device 3  . INVOKANA 100 MG TABS tablet Take 100 mg by mouth daily.     . metFORMIN  (GLUCOPHAGE) 500 MG tablet Take 1 tablet (500 mg total) by mouth 2 (two) times daily with a meal. (Patient not taking: Reported on 09/03/2019) 60 tablet 0  . methotrexate 50 MG/2ML injection Inject 0.3 mLs (7.5 mg total) into the skin once a week. (Patient not taking: Reported on 09/03/2019) 2 mL 2  . Tuberculin-Allergy Syringes 27G X 1/2" 1 ML KIT Inject 1 Syringe into the skin once a week. (Patient not taking: Reported on 09/03/2019) 12 each 0   No current facility-administered medications on file prior to visit.      There is no immunization history on file for this patient.   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  Patient verbalized understanding.  Patient states she was taking methotrexate in the morning with breakfast.  Discussed taking methotrexate on Friday evening with dinner to minimize GI upset and allow for adequate rest.  Also offered Zofran as needed for nausea.  Patient amenable to plan verbalized understanding.    She is to resume prior dose of methotrexate 0.3 mL every 7 days and folic acid 1 mg daily.  Patient states she does not need a refill at this time.  Prescription sent for Zofran.  All questions  encouraged and answered.  Instructed patient to call with any further questions or concerns.   Mariella Saa, PharmD, [place], [place] Clinical [facility]ty Pharmacist [phone]  09/03/2019 10:16 AM

## 2019-09-05 ENCOUNTER — Telehealth: Payer: Self-pay | Admitting: *Deleted

## 2019-09-05 ENCOUNTER — Other Ambulatory Visit: Payer: Self-pay | Admitting: *Deleted

## 2019-09-05 DIAGNOSIS — M0579 Rheumatoid arthritis with rheumatoid factor of multiple sites without organ or systems involvement: Secondary | ICD-10-CM

## 2019-09-05 NOTE — Telephone Encounter (Signed)
Recived a fax for FMLA paperwork on 09/04/2019. Patient was seen in the office on 09/03/2019. This was not discussed at that visit. Reviewed with Sherron Ales, PA-C and she advised patient to have PCP fill out the form as it was not discussed at her appointment.

## 2019-09-05 NOTE — Progress Notes (Signed)
Infusion orders are current for patient CBC CMP Tylenol Benadryl appointments are up to date and follow up appointment  is scheduled TB gold not due yet, due on 02/12/2020.

## 2019-09-08 NOTE — Telephone Encounter (Signed)
Patient returned the call and was advised to have PCP fill out form as the paperwork was not discussed at her appointment on 09/03/2019. Patient states she needs the FMLA paper work completed for physical therapy only. Patient was referred to physical therapy on 09/03/2019. Please advise.

## 2019-09-08 NOTE — Telephone Encounter (Signed)
Patient left a message at 6:42pm on 09/05/19 returning your call.

## 2019-09-08 NOTE — Telephone Encounter (Signed)
Ok to fill out FMLA paperwork for PT sessions.

## 2019-09-08 NOTE — Telephone Encounter (Signed)
Advised patient Ok to fill out FMLA paperwork for PT sessions. Patient verbalized understanding and will come by the office tomorrow to pay fee and fill out release. FMLA paperwork is at the front desk, Hansel Starling is aware.

## 2019-09-09 ENCOUNTER — Telehealth: Payer: Self-pay | Admitting: Rheumatology

## 2019-09-09 NOTE — Telephone Encounter (Signed)
Patient brought in an FMLA form, and $25.00 cash. An authorization form was filled out. Authorization form, FMLA form, and $25.00 cash sent thru courier to [employer] 09/10/2019.

## 2019-09-22 ENCOUNTER — Ambulatory Visit: Payer: Medicare Other | Attending: Physician Assistant | Admitting: Physical Therapy

## 2019-09-22 ENCOUNTER — Encounter: Payer: Self-pay | Admitting: Physical Therapy

## 2019-09-22 ENCOUNTER — Other Ambulatory Visit: Payer: Self-pay

## 2019-09-22 DIAGNOSIS — M25552 Pain in left hip: Secondary | ICD-10-CM

## 2019-09-22 DIAGNOSIS — G8929 Other chronic pain: Secondary | ICD-10-CM | POA: Diagnosis present

## 2019-09-22 DIAGNOSIS — M6281 Muscle weakness (generalized): Secondary | ICD-10-CM | POA: Insufficient documentation

## 2019-09-22 DIAGNOSIS — M545 Low back pain, unspecified: Secondary | ICD-10-CM

## 2019-09-22 DIAGNOSIS — M25562 Pain in left knee: Secondary | ICD-10-CM | POA: Insufficient documentation

## 2019-09-22 DIAGNOSIS — M25561 Pain in right knee: Secondary | ICD-10-CM | POA: Insufficient documentation

## 2019-09-22 NOTE — Patient Instructions (Signed)
Access Code: [email or web address] URL: [email or web address] Date: 09/22/2019 Prepared by: Rosana Hoes  Exercises Seated Hamstring Stretch - 2 x daily - 7 x weekly - 2 reps - 20 seconds hold Supine Piriformis Stretch with Foot on Ground - 2 x daily - 7 x weekly - 2 reps - 20 seconds hold Modified Thomas Stretch - 2 x daily - 7 x weekly - 2 reps - 20 seconds hold Clamshell - 1 x daily - 7 x weekly - 2 sets - 15 reps Supine Active Straight Leg Raise - 1 x daily - 7 x weekly - 2 sets - 10 reps Bridge - 1 x daily - 7 x weekly - 2 sets - 10 reps

## 2019-09-22 NOTE — Therapy (Signed)
[facility] [address] [place], [name], [address] Phone: [phone]   Fax:  [phone]  Physical Therapy Evaluation  Patient Details  Name: Wendy Woods MRN: [id number] Date of Birth: 1961-02-05 Referring Provider (PT): Gearldine Bienenstock, PA-C   Encounter Date: 09/22/2019  PT End of Session - 09/22/19 0837    Visit Number  1    Number of Visits  9    Date for PT Re-Evaluation  11/17/19    Authorization Type  [employer]    Progress Note Due on Visit  10    PT Start Time  0826    PT Stop Time  0910    PT Time Calculation (min)  44 min    Activity Tolerance  Patient tolerated treatment well    Behavior During Therapy  [facility] for tasks assessed/performed       Past Medical History:  Diagnosis Date  . DDD (degenerative disc disease), cervical 03/22/2016  . DDD (degenerative disc disease), lumbar 03/22/2016  . GERD (gastroesophageal reflux disease)   . Hyperlipidemia   . Hypertension   . Rheumatoid arthritis (HCC)   . Rheumatoid arthritis(714.0)   . 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 Type II diabetes mellitus (HCC)     Past Surgical History:  Procedure Laterality Date  . APPENDECTOMY  1968  . CATARACT EXTRACTION W/ INTRAOCULAR LENS  IMPLANT, BILATERAL Bilateral 11/2013-12/2013  . KNEE ARTHROPLASTY    . KNEE ARTHROSCOPY Left 1980's-1990's X 3  . MYOMECTOMY  1990's    There were no vitals filed for this visit.   Subjective Assessment - 09/22/19 0827    Subjective  Patient reporting pain in lower back and left hip, and both knees. States that the lower back pain is constant. 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 Back pain has been ongoing for last 2 months with no apparent mechanism of injury. Patient works she walks for exercise 3x/week.    Pertinent History  DM, HTN, RA, OA, DDD of lumbar/cervical    Limitations  Standing;Lifting;Walking;House  hold activities    How long can you sit comfortably?  Can sit as long as she wants but pain is constant    How long can you stand comfortably?  Can sit as stand as she wants but pain is constant    How long can you walk comfortably?  Can sit as walk as she wants but pain is constant    Diagnostic tests  X-ray    Patient Stated Goals  Relieve back pain    Currently in Pain?  Yes    Pain Score  7     Pain Location  Back    Pain Orientation  Lower    Pain Descriptors / Indicators  Tightness;Aching    Pain Type  Chronic pain    Pain Radiating Towards  patient reports sometimes pain will radiate to left hip    Pain Onset  More than a month ago    Pain Frequency  Constant    Aggravating Factors   Standing, walking, lifting    Pain Relieving Factors  Ointment, heat pack, medication    Effect of Pain on Daily Activities  Patient limited with activities at work         [facility] PT Assessment - 09/22/19 0001      Assessment   Medical Diagnosis  Low back pain, left hip pain, bilateral knee  pain    Referring Provider (PT)  Gearldine Bienenstock, PA-C    Onset Date/Surgical Date  --   diagnosed with RA approximately 10 years ago   Hand Dominance  Right    Next MD Visit  02/04/2020    Prior Therapy  Yes - neuro PT for aquatics      Precautions   Precautions  None      Restrictions   Weight Bearing Restrictions  No      Balance Screen   Has the patient fallen in the past 6 months  No    Has the patient had a decrease in activity level because of a fear of falling?   No    Is the patient reluctant to leave their home because of a fear of falling?   No      Home Public house manager residence      Prior Function   Level of Independence  Independent    Vocation  Full time employment    Vocation Requirements  Works at hospital in [employer], community activities      Cognition   Overall Cognitive Status  Within Functional Limits for tasks assessed       Observation/Other Assessments   Observations  Patient appears in no apparent distress    Focus on Therapeutic Outcomes (FOTO)   NA - too many body parts      Sensation   Light Touch  Appears Intact      Posture/Postural Control   Posture Comments  Patient exhibits mildly rounded shoulder and forward head posture, genu varum      ROM / Strength   AROM / PROM / Strength  AROM;PROM;Strength      AROM   Overall AROM Comments  Increased lumbar pain with all movements    AROM Assessment Site  Lumbar    Lumbar Flexion  WFL    Lumbar Extension  25%    Lumbar - Right Side Bend  75%    Lumbar - Left Side Bend  75%    Lumbar - Right Rotation  75%    Lumbar - Left Rotation  75%      PROM   Overall PROM Comments  Hip PROM grossly WFL and non-painful      Strength   Strength Assessment Site  Hip;Knee;Ankle    Right/Left Hip  Right;Left    Right Hip Flexion  4-/5    Right Hip Extension  3/5    Right Hip ABduction  3-/5    Left Hip Flexion  3+/5    Left Hip Extension  3-/5    Left Hip ABduction  3-/5    Right/Left Knee  Right;Left    Right Knee Flexion  4/5    Right Knee Extension  4/5    Left Knee Flexion  4-/5    Left Knee Extension  4-/5    Right/Left Ankle  Right;Left    Right Ankle Dorsiflexion  5/5    Right Ankle Plantar Flexion  4-/5    Left Ankle Dorsiflexion  5/5    Left Ankle Plantar Flexion  4-/5      Flexibility   Soft Tissue Assessment /Muscle Length  yes    Hamstrings  Limited bilaterally    Quadriceps  Limited bilaterally   and hip flexors   Piriformis  Limited bilaterally      Palpation   Spinal mobility  Not assseded  Palpation comment  Generalized TTP to bilateral (L > R) lumbar paraspinals and left greater trochanter region      Special Tests    Special Tests  Lumbar    Lumbar Tests  Slump Test;Straight Leg Raise      Slump test   Findings  Negative      Straight Leg Raise   Findings  Negative      Transfers   Transfers  Independent with all  Transfers      Ambulation/Gait   Ambulation/Gait  Yes    Ambulation/Gait Assistance  7: Independent    Gait Comments  Patient exhibit bilateral trendelenburg, genu varum                  Objective measurements completed on examination: See above findings.      [place] Adult PT Treatment/Exercise - 09/22/19 0001      Exercises   Exercises  Lumbar      Lumbar Exercises: Stretches   Passive Hamstring Stretch  20 seconds    Passive Hamstring Stretch Limitations  seated edge of table    Hip Flexor Stretch  20 seconds    Hip Flexor Stretch Limitations  supine edge of table    Piriformis Stretch  20 seconds    Piriformis Stretch Limitations  supine      Lumbar Exercises: Supine   Bridge with clamshell  10 reps    Bridge with [employer] Limitations  partial range secondary to weakness    Straight Leg Raise  10 reps    Straight Leg Raises Limitations  greater difficulty on left      Lumbar Exercises: Sidelying   Clam  15 reps             PT Education - 09/22/19 0837    Education Details  Exam findings, POC, HEP    Person(s) Educated  Patient    Methods  Explanation;Demonstration;Tactile cues;Verbal cues;Handout    Comprehension  Verbalized understanding;Returned demonstration;Verbal cues required;Tactile cues required;Need further instruction       PT Short Term Goals - 09/22/19 0916      PT SHORT TERM GOAL #1   Title  Patient will be I with initial HEP to progress with PT    Time  4    Period  Weeks    Status  New    Target Date  10/20/19      PT SHORT TERM GOAL #2   Title  Patient will report pain while standing at work </= 5/10 to improve activity tolerance    Time  4    Period  Weeks    Status  New    Target Date  10/20/19        PT Long Term Goals - 09/22/19 0916      PT LONG TERM GOAL #1   Title  Patient will be I with final HEP to maintain progress from PT    Time  8    Period  Weeks    Status  New    Target Date  11/17/19       PT LONG TERM GOAL #2   Title  Patient will exhibit improved hip strength grossly >/= 4/5 and knee strength grossly >/= 4+/5 MMT to improve ability to stand and walk longer periods    Time  8    Period  Weeks    Status  New    Target Date  11/17/19      PT LONG TERM  GOAL #3   Title  Patient will improve active lumbar motion by >/= 25% and report </=2/10 pain to improve lifting ability    Time  8    Period  Weeks    Status  New    Target Date  11/17/19      PT LONG TERM GOAL #4   Title  Patient will report pain with all standing and working tasks </= 3/10    Time  8    Period  Weeks    Status  New    Target Date  11/17/19             Plan - 09/22/19 4098    Clinical Impression Statement  Patient presents to PT with primary report of low back pain and left hip pain. Her lower back pain seems mainly consistent with mobility and strength deficit of the core and hips, there doesn't seem to be a radicular component. Her left hip pain seems most consistent with gluteal tendinopathy vs bursitis. She also reports bilateral knee pain most likely related to RA symptoms. She exhibits limited motion of the lumbar spine, core and general LE weakness, impaired flexibility, gait deviations. She would benefit from continued skilled PT to progress strength and mobility so she can perform all walking and working tasks without pain or limitation.    Personal Factors and Comorbidities  Past/Current Experience;Comorbidity 3+;Time since onset of injury/illness/exacerbation    Comorbidities  DM, HTN, RA, OA, DDD of lumbar/cervical    Examination-Activity Limitations  Locomotion Level;Stand;Squat;Stairs;Lift    Examination-Participation Restrictions  Meal Prep;Cleaning;Community Activity;Shop;Yard Work    Conservation officer, historic buildings  Evolving/Moderate complexity    Clinical Decision Making  Moderate    Rehab Potential  Good    PT Frequency  1x / week    PT Duration  8 weeks    PT  Treatment/Interventions  ADLs/Self Care Home Management;Cryotherapy;Electrical Stimulation;Moist Heat;Neuromuscular re-education;Balance training;Therapeutic exercise;Therapeutic activities;Functional mobility training;Stair training;Gait training;Patient/family education;Manual techniques;Dry needling;Passive range of motion;Joint Manipulations;Spinal Manipulations    PT Next Visit Plan  Assess HEP and progress PRN, lumbar and hip stretching, progress core and hip strengthening    PT Home Exercise Plan  [email or web address]: seated hamstring stretch, supine piriformis stretch, supine hip flexor stretch, clamshell, bridge, SLR    Consulted and Agree with Plan of Care  Patient       Patient will benefit from skilled therapeutic intervention in order to improve the following deficits and impairments:  Abnormal gait, Difficulty walking, Decreased endurance, Pain, Decreased activity tolerance, Decreased strength, Postural dysfunction, Improper body mechanics, Impaired flexibility  Visit Diagnosis: Chronic bilateral low back pain, unspecified whether sciatica present  Pain in left hip  Chronic pain of right knee  Chronic pain of left knee  Muscle weakness (generalized)     Problem List Patient Active Problem List   Diagnosis Date Noted  . Unilateral primary osteoarthritis, left knee 01/10/2018  . Chronic pain of left knee 01/10/2018  . DDD (degenerative disc disease), cervical 03/22/2016  . DDD (degenerative disc disease), lumbar 03/22/2016  . 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 Angioedema 07/15/2014  . Hypokalemia 07/15/2014  . Diabetes mellitus, controlled (HCC) 07/03/2012  . HTN (hypertension) 07/03/2012  . Rheumatoid arthritis (HCC) 07/03/2012  . Current chronic use of systemic steroids 07/03/2012  . 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

## 2019-09-24 ENCOUNTER — Encounter (HOSPITAL_COMMUNITY): Payer: Medicare Other

## 2019-10-01 ENCOUNTER — Other Ambulatory Visit: Payer: Self-pay

## 2019-10-01 ENCOUNTER — Ambulatory Visit (HOSPITAL_COMMUNITY)
Admission: RE | Admit: 2019-10-01 | Discharge: 2019-10-01 | Disposition: A | Discharge status: Home / Self Care | Payer: Medicare Other | Source: Ambulatory Visit | Attending: Rheumatology | Admitting: Rheumatology

## 2019-10-01 DIAGNOSIS — M0579 Rheumatoid arthritis with rheumatoid factor of multiple sites without organ or systems involvement: Secondary | ICD-10-CM | POA: Insufficient documentation

## 2019-10-01 LAB — CBC
HCT: 36.3 % (ref 36.0–46.0)
Hemoglobin: 11.7 g/dL — ABNORMAL LOW (ref 12.0–15.0)
MCH: 29 pg (ref 26.0–34.0)
MCHC: 32.2 g/dL (ref 30.0–36.0)
MCV: 90.1 fL (ref 80.0–100.0)
Platelets: 355 10*3/uL (ref 150–400)
RBC: 4.03 MIL/uL (ref 3.87–5.11)
RDW: 13 % (ref 11.5–15.5)
WBC: 6.1 10*3/uL (ref 4.0–10.5)
nRBC: 0 % (ref 0.0–0.2)

## 2019-10-01 LAB — COMPREHENSIVE METABOLIC PANEL
ALT: 14 U/L (ref 0–44)
AST: 22 U/L (ref 15–41)
Albumin: 3.4 g/dL — ABNORMAL LOW (ref 3.5–5.0)
Alkaline Phosphatase: 62 U/L (ref 38–126)
Anion gap: 8 (ref 5–15)
BUN: 19 mg/dL (ref 6–20)
CO2: 30 mmol/L (ref 22–32)
Calcium: 9.2 mg/dL (ref 8.9–10.3)
Chloride: 98 mmol/L (ref 98–111)
Creatinine, Ser: 0.88 mg/dL (ref 0.44–1.00)
GFR calc Af Amer: >60 mL/min (ref >60)
GFR calc non Af Amer: >60 mL/min (ref >60)
Glucose, Bld: 177 mg/dL — ABNORMAL HIGH (ref 70–99)
Potassium: 3.9 mmol/L (ref 3.5–5.1)
Sodium: 136 mmol/L (ref 135–145)
Total Bilirubin: 1 mg/dL (ref 0.3–1.2)
Total Protein: 7.5 g/dL (ref 6.5–8.1)

## 2019-10-01 MED ORDER — DIPHENHYDRAMINE HCL 25 MG PO CAPS: 25.0000 mg | ORAL_CAPSULE | ORAL | Status: DC

## 2019-10-01 MED ORDER — ACETAMINOPHEN 325 MG PO TABS: 650.0000 mg | ORAL_TABLET | ORAL | Status: DC

## 2019-10-01 MED ORDER — SODIUM CHLORIDE 0.9 % IV SOLN
6.0000 mg/kg | INTRAVENOUS | Status: DC
  Administered 2019-10-01: 500 mg via INTRAVENOUS
  Filled 2019-10-01: qty 50

## 2019-10-01 NOTE — Progress Notes (Signed)
Glucose is elevated.  Anemia noted.  Please forward labs to her PCP.

## 2019-10-06 ENCOUNTER — Ambulatory Visit: Payer: Medicare Other | Attending: Physician Assistant | Admitting: Physical Therapy

## 2019-10-06 ENCOUNTER — Encounter: Payer: Self-pay | Admitting: Physical Therapy

## 2019-10-06 ENCOUNTER — Other Ambulatory Visit: Payer: Self-pay

## 2019-10-06 DIAGNOSIS — R29898 Other symptoms and signs involving the musculoskeletal system: Secondary | ICD-10-CM | POA: Insufficient documentation

## 2019-10-06 DIAGNOSIS — G8929 Other chronic pain: Secondary | ICD-10-CM

## 2019-10-06 DIAGNOSIS — M6281 Muscle weakness (generalized): Secondary | ICD-10-CM | POA: Diagnosis present

## 2019-10-06 DIAGNOSIS — M25562 Pain in left knee: Secondary | ICD-10-CM | POA: Insufficient documentation

## 2019-10-06 DIAGNOSIS — M25561 Pain in right knee: Secondary | ICD-10-CM | POA: Diagnosis present

## 2019-10-06 DIAGNOSIS — M545 Low back pain, unspecified: Secondary | ICD-10-CM

## 2019-10-06 DIAGNOSIS — M25552 Pain in left hip: Secondary | ICD-10-CM | POA: Diagnosis present

## 2019-10-06 NOTE — Therapy (Signed)
[place], [place], [address] Phone: [phone]   Fax:  [phone]  Physical Therapy Treatment  Patient Details  Name: Wendy Woods MRN: [id number] Date of Birth: 1961-01-13 Referring Provider (PT): Ofilia Neas, PA-C   Encounter Date: 10/06/2019  PT End of Session - 10/06/19 1051    Visit Number  2    Number of Visits  9    Date for PT Re-Evaluation  11/17/19    Authorization Type  [employer] MEDIC[employer]    Progress Note Due on Visit  10    PT Start Time  1049    PT Stop Time  1127    PT Time Calculation (min)  38 min    Activity Tolerance  Patient tolerated treatment well    Behavior During Therapy  [facility] for tasks assessed/performed       Past Medical History:  Diagnosis Date  . DDD (degenerative disc disease), cervical 03/22/2016  . DDD (degenerative disc disease), lumbar 03/22/2016  . GERD (gastroesophageal reflux disease)   . Hyperlipidemia   . Hypertension   . Rheumatoid arthritis ([place])   . Rheumatoid arthritis(714.0)   . 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 Type II diabetes mellitus ([place])     Past Surgical History:  Procedure Laterality Date  . APPENDECTOMY  1968  . CATARACT EXTRACTION W/ INTRAOCULAR LENS  IMPLANT, BILATERAL Bilateral 11/2013-12/2013  . KNEE ARTHROPLASTY    . KNEE ARTHROSCOPY Left 1980's-1990's X 3  . MYOMECTOMY  1990's    There were no vitals filed for this visit.  Subjective Assessment - 10/06/19 1051    Subjective  Patient reports she is doing well. She is feeling pretty good today.    Patient Stated Goals  Relieve back pain    Currently in Pain?  Yes    Pain Score  4     Pain Location  Back    Pain Orientation  Lower    Pain Descriptors / Indicators  Aching;Tightness    Pain Type  Chronic pain    Pain Onset  More than a month ago    Pain Frequency  Constant         [facility] PT Assessment - 10/06/19 0001      Strength   Right Hip ABduction  3/5    Left Hip  ABduction  3-/5                    [facility] Adult PT Treatment/Exercise - 10/06/19 0001      Exercises   Exercises  Lumbar      Lumbar Exercises: Stretches   Passive Hamstring Stretch  2 reps;20 seconds    Passive Hamstring Stretch Limitations  seated edge of table    Lower Trunk Rotation  5 reps;10 seconds    Hip Flexor Stretch  2 reps;20 seconds    Hip Flexor Stretch Limitations  supine edge of table    Piriformis Stretch  2 reps;20 seconds    Piriformis Stretch Limitations  supine      Lumbar Exercises: Aerobic   Nustep  L5 x 5 min (LE only)      Lumbar Exercises: Seated   Sit to Stand  10 reps   2 sets   Sit to Stand Limitations  Focus on hip hinge technique and slow controlled lowering      Lumbar Exercises: Supine   Bridge  10 reps;3 seconds   2 sets   Bridge Limitations  partial range secondary  to weakness    Straight Leg Raise  10 reps   2 sets     Lumbar Exercises: Sidelying   Hip Abduction  10 reps   2 sets            PT Education - 10/06/19 1050    Education Details  HEP    Person(s) Educated  Patient    Methods  Explanation;Demonstration;Verbal cues    Comprehension  Verbalized understanding;Returned demonstration;Verbal cues required;Need further instruction       PT Short Term Goals - 09/22/19 0916      PT SHORT TERM GOAL #1   Title  Patient will be I with initial HEP to progress with PT    Time  4    Period  Weeks    Status  New    Target Date  10/20/19      PT SHORT TERM GOAL #2   Title  Patient will report pain while standing at work </= 5/10 to improve activity tolerance    Time  4    Period  Weeks    Status  New    Target Date  10/20/19        PT Long Term Goals - 09/22/19 0916      PT LONG TERM GOAL #1   Title  Patient will be I with final HEP to maintain progress from PT    Time  8    Period  Weeks    Status  New    Target Date  11/17/19      PT LONG TERM GOAL #2   Title  Patient will exhibit improved hip  strength grossly >/= 4/5 and knee strength grossly >/= 4+/5 MMT to improve ability to stand and walk longer periods    Time  8    Period  Weeks    Status  New    Target Date  11/17/19      PT LONG TERM GOAL #3   Title  Patient will improve active lumbar motion by >/= 25% and report </=2/10 pain to improve lifting ability    Time  8    Period  Weeks    Status  New    Target Date  11/17/19      PT LONG TERM GOAL #4   Title  Patient will report pain with all standing and working tasks </= 3/10    Time  8    Period  Weeks    Status  New    Target Date  11/17/19            Plan - 10/06/19 1051    Clinical Impression Statement  Patient tolerated therapy well with no adverse effects. Progressed strengthening and mobility with good tolerance. She did report some lateral left hip discomfort with strengthening and she does continue exhibit greater strength deficit on left side. With sit<>stand she required cueing for slow and controlled decent and she had difficulty lowering all the way to standard height chair. She would benefit from continued skilled PT to progress strength and mobility so she can perform all walking and working tasks without pain or limitation.    PT Treatment/Interventions  ADLs/Self Care Home Management;Cryotherapy;Electrical Stimulation;Moist Heat;Neuromuscular re-education;Balance training;Therapeutic exercise;Therapeutic activities;Functional mobility training;Stair training;Gait training;Patient/family education;Manual techniques;Dry needling;Passive range of motion;Joint Manipulations;Spinal Manipulations    PT Next Visit Plan  Assess HEP and progress PRN, lumbar and hip stretching, progress core and hip strengthening    PT Home Exercise Plan  [email or web address]: seated  hamstring stretch, supine piriformis stretch, supine hip flexor stretch, clamshell, bridge, SLR    Consulted and Agree with Plan of Care  Patient       Patient will benefit from skilled therapeutic  intervention in order to improve the following deficits and impairments:  Abnormal gait, Difficulty walking, Decreased endurance, Pain, Decreased activity tolerance, Decreased strength, Postural dysfunction, Improper body mechanics, Impaired flexibility  Visit Diagnosis: Chronic bilateral low back pain, unspecified whether sciatica present  Pain in left hip  Chronic pain of right knee  Chronic pain of left knee  Muscle weakness (generalized)     Problem List Patient Active Problem List   Diagnosis Date Noted  . Unilateral primary osteoarthritis, left knee 01/10/2018  . Chronic pain of left knee 01/10/2018  . DDD (degenerative disc disease), cervical 03/22/2016  . DDD (degenerative disc disease), lumbar 03/22/2016  . 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 Angioedema 07/15/2014  . Hypokalemia 07/15/2014  . Diabetes mellitus, controlled (HCC) 07/03/2012  . HTN (hypertension) 07/03/2012  . Rheumatoid arthritis (HCC) 07/03/2012  . Current chronic use of systemic steroids 07/03/2012  . 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

## 2019-10-13 ENCOUNTER — Ambulatory Visit: Payer: Medicare Other | Admitting: Physical Therapy

## 2019-10-13 ENCOUNTER — Encounter: Payer: Self-pay | Admitting: Physical Therapy

## 2019-10-13 ENCOUNTER — Other Ambulatory Visit: Payer: Self-pay

## 2019-10-13 DIAGNOSIS — G8929 Other chronic pain: Secondary | ICD-10-CM

## 2019-10-13 DIAGNOSIS — M545 Low back pain, unspecified: Secondary | ICD-10-CM

## 2019-10-13 DIAGNOSIS — M6281 Muscle weakness (generalized): Secondary | ICD-10-CM

## 2019-10-13 DIAGNOSIS — M25552 Pain in left hip: Secondary | ICD-10-CM

## 2019-10-13 NOTE — Patient Instructions (Signed)
Access Code: [email or web address] URL: [email or web address] Date: 10/13/2019 Prepared by: Rosana Hoes  Exercises Seated Hamstring Stretch - 2 x daily - 7 x weekly - 2 reps - 20 seconds hold Supine Piriformis Stretch with Foot on Ground - 2 x daily - 7 x weekly - 2 reps - 20 seconds hold Modified Thomas Stretch - 2 x daily - 7 x weekly - 2 reps - 20 seconds hold Supine Active Straight Leg Raise - 1 x daily - 7 x weekly - 2 sets - 10 reps Bridge - 1 x daily - 7 x weekly - 2 sets - 10 reps Sidelying Hip Abduction - 1 x daily - 7 x weekly - 2 sets - 10 reps Sit to Stand - 1 x daily - 7 x weekly - 2 sets - 10 reps

## 2019-10-13 NOTE — Therapy (Signed)
[place], [place], [address] Phone: [phone]   Fax:  [phone]  Physical Therapy Treatment  Patient Details  Name: Wendy Woods MRN: [id number] Date of Birth: 06-29-1960 Referring Provider (PT): Ofilia Neas, PA-C   Encounter Date: 10/13/2019   PT End of Session - 10/13/19 1048    Visit Number 3    Number of Visits 9    Date for PT Re-Evaluation 11/17/19    Authorization Type [employer] MEDICARE    Progress Note Due on Visit 10    PT Start Time 1043    PT Stop Time 1124    PT Time Calculation (min) 41 min    Activity Tolerance Patient tolerated treatment well    Behavior During Therapy [facility] for tasks assessed/performed           Past Medical History:  Diagnosis Date  . DDD (degenerative disc disease), cervical 03/22/2016  . DDD (degenerative disc disease), lumbar 03/22/2016  . GERD (gastroesophageal reflux disease)   . Hyperlipidemia   . Hypertension   . Rheumatoid arthritis ([place])   . Rheumatoid arthritis(714.0)   . 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 Type II diabetes mellitus ([place])     Past Surgical History:  Procedure Laterality Date  . APPENDECTOMY  1968  . CATARACT EXTRACTION W/ INTRAOCULAR LENS  IMPLANT, BILATERAL Bilateral 11/2013-12/2013  . KNEE ARTHROPLASTY    . KNEE ARTHROSCOPY Left 1980's-1990's X 3  . MYOMECTOMY  1990's    There were no vitals filed for this visit.   Subjective Assessment - 10/13/19 1046    Subjective Patient reports she is feeling stiff today.    Patient Stated Goals Relieve back pain    Currently in Pain? Yes    Pain Score 7     Pain Location Back    Pain Orientation Lower    Pain Descriptors / Indicators Tightness    Pain Type Chronic pain    Pain Onset More than a month ago    Pain Frequency Constant              [facility] PT Assessment - 10/13/19 0001      Strength   Left Hip Extension 3/5    Left Hip ABduction 3-/5                          [facility] Adult PT Treatment/Exercise - 10/13/19 0001      Lumbar Exercises: Stretches   Lower Trunk Rotation 5 reps;10 seconds    Hip Flexor Stretch 2 reps;20 seconds    Hip Flexor Stretch Limitations supine edge of table    Piriformis Stretch 2 reps;20 seconds    Piriformis Stretch Limitations supine      Lumbar Exercises: Aerobic   Recumbent Bike L3 x 5 min      Lumbar Exercises: Standing   Other Standing Lumbar Exercises Bent over hip extension with 2#       Lumbar Exercises: Seated   Sit to Stand 10 reps   2 sets   Sit to Stand Limitations Focus on hip hinge technique and slow controlled lowering, used wedge to raise level       Lumbar Exercises: Supine   Bridge 10 reps;3 seconds   2 sets   Bridge Limitations partial range secondary to weakness    Straight Leg Raise 10 reps   2 sets  PT Education - 10/13/19 1048    Education Details HEP    Person(s) Educated Patient    Methods Explanation;Demonstration;Verbal cues    Comprehension Verbalized understanding;Returned demonstration;Verbal cues required;Need further instruction            PT Short Term Goals - 09/22/19 0916      PT SHORT TERM GOAL #1   Title Patient will be I with initial HEP to progress with PT    Time 4    Period Weeks    Status New    Target Date 10/20/19      PT SHORT TERM GOAL #2   Title Patient will report pain while standing at work </= 5/10 to improve activity tolerance    Time 4    Period Weeks    Status New    Target Date 10/20/19             PT Long Term Goals - 09/22/19 0916      PT LONG TERM GOAL #1   Title Patient will be I with final HEP to maintain progress from PT    Time 8    Period Weeks    Status New    Target Date 11/17/19      PT LONG TERM GOAL #2   Title Patient will exhibit improved hip strength grossly >/= 4/5 and knee strength grossly >/= 4+/5 MMT to improve ability to stand and walk longer periods    Time  8    Period Weeks    Status New    Target Date 11/17/19      PT LONG TERM GOAL #3   Title Patient will improve active lumbar motion by >/= 25% and report </=2/10 pain to improve lifting ability    Time 8    Period Weeks    Status New    Target Date 11/17/19      PT LONG TERM GOAL #4   Title Patient will report pain with all standing and working tasks </= 3/10    Time 8    Period Weeks    Status New    Target Date 11/17/19                 Plan - 10/13/19 1049    Clinical Impression Statement Patient tolerated therapy well with no adverse effects. Continues strengthening this visit for core and hips with good tolerance. Sh continues to exhibit hip weakness (L>R) and reported tenderness over left lateral hip. She was encouraged to continue walking/activity at home to avoid stiffness. She would benefit from continued skilled PT to progress strength and mobility so she can perform all walking and working tasks without pain or limitation.    PT Treatment/Interventions ADLs/Self Care Home Management;Cryotherapy;Electrical Stimulation;Moist Heat;Neuromuscular re-education;Balance training;Therapeutic exercise;Therapeutic activities;Functional mobility training;Stair training;Gait training;Patient/family education;Manual techniques;Dry needling;Passive range of motion;Joint Manipulations;Spinal Manipulations    PT Next Visit Plan Assess HEP and progress PRN, lumbar and hip stretching, progress core and hip strengthening    PT Home Exercise Plan [email or web address]: seated hamstring stretch, supine piriformis stretch, supine hip flexor stretch, bridge, SLR, side lying hip abduction, sit<>stand    Consulted and Agree with Plan of Care Patient           Patient will benefit from skilled therapeutic intervention in order to improve the following deficits and impairments:  Abnormal gait, Difficulty walking, Decreased endurance, Pain, Decreased activity tolerance, Decreased strength, Postural  dysfunction, Improper body mechanics, Impaired flexibility  Visit Diagnosis: Chronic bilateral low back  pain, unspecified whether sciatica present  Pain in left hip  Chronic pain of right knee  Chronic pain of left knee  Muscle weakness (generalized)     Problem List Patient Active Problem List   Diagnosis Date Noted  . Unilateral primary osteoarthritis, left knee 01/10/2018  . Chronic pain of left knee 01/10/2018  . DDD (degenerative disc disease), cervical 03/22/2016  . DDD (degenerative disc disease), lumbar 03/22/2016  . 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 Angioedema 07/15/2014  . Hypokalemia 07/15/2014  . Diabetes mellitus, controlled (HCC) 07/03/2012  . HTN (hypertension) 07/03/2012  . Rheumatoid arthritis (HCC) 07/03/2012  . Current chronic use of systemic steroids 07/03/2012  . 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 [place], [name], [address] Phone: [phone]   Fax:  [phone]  Name: Wendy Woods MRN: [id number] Date of Birth: 03-02-1961

## 2019-10-20 ENCOUNTER — Ambulatory Visit: Payer: Medicare Other | Admitting: Physical Therapy

## 2019-10-20 ENCOUNTER — Other Ambulatory Visit: Payer: Self-pay

## 2019-10-20 ENCOUNTER — Encounter: Payer: Self-pay | Admitting: Physical Therapy

## 2019-10-20 DIAGNOSIS — M545 Low back pain, unspecified: Secondary | ICD-10-CM

## 2019-10-20 DIAGNOSIS — G8929 Other chronic pain: Secondary | ICD-10-CM

## 2019-10-20 DIAGNOSIS — R29898 Other symptoms and signs involving the musculoskeletal system: Secondary | ICD-10-CM

## 2019-10-20 DIAGNOSIS — M6281 Muscle weakness (generalized): Secondary | ICD-10-CM

## 2019-10-20 DIAGNOSIS — M25552 Pain in left hip: Secondary | ICD-10-CM

## 2019-10-20 NOTE — Therapy (Signed)
[facility] Outpatient Rehabilitation [facility] [address] [place], [name], [address] Phone: [phone]   Fax:  [phone]  Physical Therapy Treatment  Patient Details  Name: Wendy Woods MRN: [id number] Date of Birth: 03/02/1961 Referring Provider (PT): Gearldine Bienenstock, PA-C   Encounter Date: 10/20/2019   PT End of Session - 10/20/19 1048    Visit Number 4    Number of Visits 9    Date for PT Re-Evaluation 11/17/19    Authorization Type [employer] MEDICARE    Progress Note Due on Visit 10    PT Start Time 1045    PT Stop Time 1125    PT Time Calculation (min) 40 min    Activity Tolerance Patient tolerated treatment well    Behavior During Therapy [facility] for tasks assessed/performed           Past Medical History:  Diagnosis Date  . DDD (degenerative disc disease), cervical 03/22/2016  . DDD (degenerative disc disease), lumbar 03/22/2016  . GERD (gastroesophageal reflux disease)   . Hyperlipidemia   . Hypertension   . Rheumatoid arthritis (HCC)   . Rheumatoid arthritis(714.0)   . 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 Type II diabetes mellitus ([facility])     Past Surgical History:  Procedure Laterality Date  . APPENDECTOMY  1968  . CATARACT EXTRACTION W/ INTRAOCULAR LENS  IMPLANT, BILATERAL Bilateral 11/2013-12/2013  . KNEE ARTHROPLASTY    . KNEE ARTHROSCOPY Left 1980's-1990's X 3  . MYOMECTOMY  1990's    There were no vitals filed for this visit.   Subjective Assessment - 10/20/19 1044    Subjective Patient reports she is feeling ok. Patient reports she continues to have pain, but that is everyday.    Patient Stated Goals Relieve back pain    Currently in Pain? Yes    Pain Score 6     Pain Location Back    Pain Orientation Lower    Pain Descriptors / Indicators Tightness    Pain Type Chronic pain    Pain Onset More than a month ago    Pain Frequency Constant    Aggravating Factors  Standing, walking, lifting                              [facility] Adult PT Treatment/Exercise - 10/20/19 0001      Lumbar Exercises: Stretches   Passive Hamstring Stretch 2 reps;20 seconds    Passive Hamstring Stretch Limitations supine    Single Knee to Chest Stretch 2 reps;20 seconds    Single Knee to Chest Stretch Limitations supine    Lower Trunk Rotation 4 reps;10 seconds    Hip Flexor Stretch 2 reps;20 seconds    Hip Flexor Stretch Limitations supine edge of table    Piriformis Stretch 2 reps;20 seconds    Piriformis Stretch Limitations supine      Lumbar Exercises: Aerobic   Nustep L5 x 4 min (UE and LE)      Lumbar Exercises: Standing   Other Standing Lumbar Exercises Bent over hip extension with 3# 2x10      Lumbar Exercises: Seated   Sit to Stand 10 reps   2 sets   Sit to Stand Limitations Airex pad in chair, focus on controlled decent      Lumbar Exercises: Supine   Bridge 10 reps;3 seconds    Straight Leg Raise 10 reps    Other Supine Lumbar Exercises 90-90 alternating heel taps 5x10  Lumbar Exercises: Sidelying   Clam 10 reps   2 sets   Clam Limitations red band                  PT Education - 10/20/19 1047    Education Details HEP    Person(s) Educated Patient    Methods Explanation;Demonstration;Verbal cues    Comprehension Verbalized understanding;Returned demonstration;Verbal cues required;Need further instruction            PT Short Term Goals - 10/20/19 1049      PT SHORT TERM GOAL #1   Title Patient will be I with initial HEP to progress with PT    Time 4    Period Weeks    Status On-going    Target Date 10/20/19      PT SHORT TERM GOAL #2   Title Patient will report pain while standing at work </= 5/10 to improve activity tolerance    Baseline Patient reports 6/10 pain level    Time 4    Period Weeks    Status On-going    Target Date 10/20/19             PT Long Term Goals - 09/22/19 0916      PT LONG TERM GOAL #1   Title Patient will  be I with final HEP to maintain progress from PT    Time 8    Period Weeks    Status New    Target Date 11/17/19      PT LONG TERM GOAL #2   Title Patient will exhibit improved hip strength grossly >/= 4/5 and knee strength grossly >/= 4+/5 MMT to improve ability to stand and walk longer periods    Time 8    Period Weeks    Status New    Target Date 11/17/19      PT LONG TERM GOAL #3   Title Patient will improve active lumbar motion by >/= 25% and report </=2/10 pain to improve lifting ability    Time 8    Period Weeks    Status New    Target Date 11/17/19      PT LONG TERM GOAL #4   Title Patient will report pain with all standing and working tasks </= 3/10    Time 8    Period Weeks    Status New    Target Date 11/17/19                 Plan - 10/20/19 1048    Clinical Impression Statement Patient tolerated therapy well with no adverse effects. Patient reported mores stiffness this visit so worked on stretching and patient reported improvement in symptoms. Continued strengthening for core and hips with good tolerance. Patient did report improvement in lower back pain/stiffness following therapy. She would benefit from continued skilled PT to progress strength and mobility so she can perform all walking and working tasks without pain or limitation.    PT Treatment/Interventions ADLs/Self Care Home Management;Cryotherapy;Electrical Stimulation;Moist Heat;Neuromuscular re-education;Balance training;Therapeutic exercise;Therapeutic activities;Functional mobility training;Stair training;Gait training;Patient/family education;Manual techniques;Dry needling;Passive range of motion;Joint Manipulations;Spinal Manipulations    PT Next Visit Plan Assess HEP and progress PRN, lumbar and hip stretching, progress core and hip strengthening    PT Home Exercise Plan [email or web address]: seated hamstring stretch, supine piriformis stretch, supine hip flexor stretch, bridge, SLR, side lying hip abduction,  sit<>stand    Consulted and Agree with Plan of Care Patient  Patient will benefit from skilled therapeutic intervention in order to improve the following deficits and impairments:  Abnormal gait, Difficulty walking, Decreased endurance, Pain, Decreased activity tolerance, Decreased strength, Postural dysfunction, Improper body mechanics, Impaired flexibility  Visit Diagnosis: Chronic bilateral low back pain, unspecified whether sciatica present  Pain in left hip  Chronic pain of right knee  Chronic pain of left knee  Muscle weakness (generalized)  Other symptoms and signs involving the musculoskeletal system     Problem List Patient Active Problem List   Diagnosis Date Noted  . Unilateral primary osteoarthritis, left knee 01/10/2018  . Chronic pain of left knee 01/10/2018  . DDD (degenerative disc disease), cervical 03/22/2016  . DDD (degenerative disc disease), lumbar 03/22/2016  . 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 Angioedema 07/15/2014  . Hypokalemia 07/15/2014  . Diabetes mellitus, controlled ([place]) 07/03/2012  . HTN (hypertension) 07/03/2012  . Rheumatoid arthritis ([place]) 07/03/2012  . Current chronic use of systemic steroids 07/03/2012  . 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

## 2019-10-27 ENCOUNTER — Encounter: Payer: Self-pay | Admitting: Physical Therapy

## 2019-10-27 ENCOUNTER — Other Ambulatory Visit: Payer: Self-pay

## 2019-10-27 ENCOUNTER — Ambulatory Visit: Payer: Medicare Other | Admitting: Physical Therapy

## 2019-10-27 DIAGNOSIS — M25552 Pain in left hip: Secondary | ICD-10-CM

## 2019-10-27 DIAGNOSIS — M545 Low back pain, unspecified: Secondary | ICD-10-CM

## 2019-10-27 DIAGNOSIS — G8929 Other chronic pain: Secondary | ICD-10-CM

## 2019-10-27 DIAGNOSIS — M6281 Muscle weakness (generalized): Secondary | ICD-10-CM

## 2019-10-27 NOTE — Therapy (Signed)
[facility] Outpatient Rehabilitation [facility] [address] [place], [name], [address] Phone: [phone]   Fax:  [phone]  Physical Therapy Treatment  Patient Details  Name: Wendy Woods MRN: [id number] Date of Birth: 28-May-1960 Referring Provider (PT): Gearldine Bienenstock, PA-C   Encounter Date: 10/27/2019   PT End of Session - 10/27/19 0949    Visit Number 5    Number of Visits 9    Date for PT Re-Evaluation 11/17/19    Authorization Type [employer] MEDICARE    Progress Note Due on Visit 10    PT Start Time 0950    PT Stop Time 1030    PT Time Calculation (min) 40 min    Activity Tolerance Patient tolerated treatment well    Behavior During Therapy [facility] for tasks assessed/performed           Past Medical History:  Diagnosis Date  . DDD (degenerative disc disease), cervical 03/22/2016  . DDD (degenerative disc disease), lumbar 03/22/2016  . GERD (gastroesophageal reflux disease)   . Hyperlipidemia   . Hypertension   . Rheumatoid arthritis (HCC)   . Rheumatoid arthritis(714.0)   . 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 Type II diabetes mellitus ([facility])     Past Surgical History:  Procedure Laterality Date  . APPENDECTOMY  1968  . CATARACT EXTRACTION W/ INTRAOCULAR LENS  IMPLANT, BILATERAL Bilateral 11/2013-12/2013  . KNEE ARTHROPLASTY    . KNEE ARTHROSCOPY Left 1980's-1990's X 3  . MYOMECTOMY  1990's    There were no vitals filed for this visit.   Subjective Assessment - 10/27/19 0948    Subjective Patient reports she is doing well. Feeling better today.    Patient Stated Goals Relieve back pain    Currently in Pain? Yes    Pain Score 5     Pain Location Back    Pain Orientation Lower    Pain Descriptors / Indicators Tightness;Aching    Pain Type Chronic pain    Pain Onset More than a month ago    Pain Frequency Constant              [facility] PT Assessment - 10/27/19 0001      Strength   Right Hip Extension 3+/5    Right Hip ABduction 3+/5      Left Hip Extension 3/5    Left Hip ABduction 3-/5                         [facility] Adult PT Treatment/Exercise - 10/27/19 0001      Exercises   Exercises Lumbar      Lumbar Exercises: Stretches   Passive Hamstring Stretch 2 reps;20 seconds    Passive Hamstring Stretch Limitations supine    Single Knee to Chest Stretch 2 reps;20 seconds    Single Knee to Chest Stretch Limitations supine    Lower Trunk Rotation 5 reps;10 seconds    Hip Flexor Stretch 2 reps;20 seconds    Hip Flexor Stretch Limitations supine edge of table      Lumbar Exercises: Aerobic   Nustep L5 x 5 min (UE and LE)      Lumbar Exercises: Standing   Other Standing Lumbar Exercises Bent over hip extension with yellow 2x10    Other Standing Lumbar Exercises Standing hip abduction with yellow 2x10      Lumbar Exercises: Seated   Sit to Stand 10 reps   2 sets   Sit to Stand Limitations Airex pad in  chair, focus on controlled decent      Lumbar Exercises: Supine   Bridge 10 reps;3 seconds                  PT Education - 10/27/19 0948    Education Details HEP    Person(s) Educated Patient    Methods Explanation;Demonstration;Verbal cues    Comprehension Verbalized understanding;Returned demonstration;Verbal cues required;Need further instruction            PT Short Term Goals - 10/20/19 1049      PT SHORT TERM GOAL #1   Title Patient will be I with initial HEP to progress with PT    Time 4    Period Weeks    Status On-going    Target Date 10/20/19      PT SHORT TERM GOAL #2   Title Patient will report pain while standing at work </= 5/10 to improve activity tolerance    Baseline Patient reports 6/10 pain level    Time 4    Period Weeks    Status On-going    Target Date 10/20/19             PT Long Term Goals - 09/22/19 0916      PT LONG TERM GOAL #1   Title Patient will be I with final HEP to maintain progress from PT    Time 8    Period Weeks    Status New     Target Date 11/17/19      PT LONG TERM GOAL #2   Title Patient will exhibit improved hip strength grossly >/= 4/5 and knee strength grossly >/= 4+/5 MMT to improve ability to stand and walk longer periods    Time 8    Period Weeks    Status New    Target Date 11/17/19      PT LONG TERM GOAL #3   Title Patient will improve active lumbar motion by >/= 25% and report </=2/10 pain to improve lifting ability    Time 8    Period Weeks    Status New    Target Date 11/17/19      PT LONG TERM GOAL #4   Title Patient will report pain with all standing and working tasks </= 3/10    Time 8    Period Weeks    Status New    Target Date 11/17/19                 Plan - 10/27/19 0949    Clinical Impression Statement Patient tolerated therapy well with no adverse effects. Continued working on stretching as patient reports this helped her last visit  and makes her feel better. Progressed strengthening with good tolerance and added in new exercises to HEP. She does continue to demonstrate hip/core weakess. She would benefit from continued skilled PT to progress strength and mobility so she can perform all walking and working tasks without pain or limitation.    PT Treatment/Interventions ADLs/Self Care Home Management;Cryotherapy;Electrical Stimulation;Moist Heat;Neuromuscular re-education;Balance training;Therapeutic exercise;Therapeutic activities;Functional mobility training;Stair training;Gait training;Patient/family education;Manual techniques;Dry needling;Passive range of motion;Joint Manipulations;Spinal Manipulations    PT Next Visit Plan Assess HEP and progress PRN, lumbar and hip stretching, progress core and hip strengthening, lifting mechanics    PT Home Exercise Plan [email or web address]: seated hamstring stretch, supine piriformis stretch, supine hip flexor stretch, bridge, SLR, sit<>stand, stand hip extension and abduction with yellow    Consulted and Agree with Plan of Care Patient  Patient will benefit from skilled therapeutic intervention in order to improve the following deficits and impairments:  Abnormal gait, Difficulty walking, Decreased endurance, Pain, Decreased activity tolerance, Decreased strength, Postural dysfunction, Improper body mechanics, Impaired flexibility  Visit Diagnosis: Chronic bilateral low back pain, unspecified whether sciatica present  Pain in left hip  Chronic pain of right knee  Chronic pain of left knee  Muscle weakness (generalized)     Problem List Patient Active Problem List   Diagnosis Date Noted  . Unilateral primary osteoarthritis, left knee 01/10/2018  . Chronic pain of left knee 01/10/2018  . DDD (degenerative disc disease), cervical 03/22/2016  . DDD (degenerative disc disease), lumbar 03/22/2016  . 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 Angioedema 07/15/2014  . Hypokalemia 07/15/2014  . Diabetes mellitus, controlled ([place]) 07/03/2012  . HTN (hypertension) 07/03/2012  . Rheumatoid arthritis ([place]) 07/03/2012  . Current chronic use of systemic steroids 07/03/2012  . 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

## 2019-10-27 NOTE — Patient Instructions (Signed)
Access Code: [email or web address] URL: [email or web address] Date: 10/27/2019 Prepared by: Rosana Hoes  Exercises Seated Hamstring Stretch - 2 x daily - 7 x weekly - 2 reps - 20 seconds hold Supine Piriformis Stretch with Foot on Ground - 2 x daily - 7 x weekly - 2 reps - 20 seconds hold Modified Thomas Stretch - 2 x daily - 7 x weekly - 2 reps - 20 seconds hold Supine Active Straight Leg Raise - 1 x daily - 7 x weekly - 2 sets - 10 reps Bridge - 1 x daily - 7 x weekly - 2 sets - 10 reps Sit to Stand - 1 x daily - 7 x weekly - 2 sets - 10 reps Standing Hip Abduction with Resistance at Ankles and Counter Support - 1 x daily - 7 x weekly - 2 sets - 10 reps Standing Hip Extension with Resistance at Ankles and Counter Support - 1 x daily - 7 x weekly - 2 sets - 10 reps

## 2019-11-10 ENCOUNTER — Other Ambulatory Visit: Payer: Self-pay

## 2019-11-10 ENCOUNTER — Encounter: Payer: Self-pay | Admitting: Physical Therapy

## 2019-11-10 ENCOUNTER — Ambulatory Visit: Payer: Medicare Other | Attending: Physician Assistant | Admitting: Physical Therapy

## 2019-11-10 DIAGNOSIS — M25562 Pain in left knee: Secondary | ICD-10-CM | POA: Diagnosis present

## 2019-11-10 DIAGNOSIS — R29898 Other symptoms and signs involving the musculoskeletal system: Secondary | ICD-10-CM | POA: Insufficient documentation

## 2019-11-10 DIAGNOSIS — M25552 Pain in left hip: Secondary | ICD-10-CM | POA: Diagnosis present

## 2019-11-10 DIAGNOSIS — G8929 Other chronic pain: Secondary | ICD-10-CM

## 2019-11-10 DIAGNOSIS — M6281 Muscle weakness (generalized): Secondary | ICD-10-CM | POA: Diagnosis present

## 2019-11-10 DIAGNOSIS — M25561 Pain in right knee: Secondary | ICD-10-CM | POA: Insufficient documentation

## 2019-11-10 DIAGNOSIS — M545 Low back pain, unspecified: Secondary | ICD-10-CM

## 2019-11-10 NOTE — Therapy (Signed)
[facility] [address] [place], [name], [address] Phone: [phone]   Fax:  [phone]  Physical Therapy Treatment    Progress Note Reporting Period 09/22/2019 to 11/10/2019  See note below for Objective Data and Assessment of Progress/Goals.    Patient Details  Name: Wendy Woods MRN: [id number] Date of Birth: 04-19-1961 Referring Provider (PT): Gearldine Bienenstock, PA-C   Encounter Date: 11/10/2019   PT End of Session - 11/10/19 0932    Visit Number 6    Number of Visits 14    Date for PT Re-Evaluation 01/05/20    Authorization Type [employer]ICARE    Progress Note Due on Visit 10    PT Start Time 0915    PT Stop Time 1000    PT Time Calculation (min) 45 min    Activity Tolerance Patient tolerated treatment well    Behavior During Therapy [facility] for tasks assessed/performed           Past Medical History:  Diagnosis Date  . DDD (degenerative disc disease), cervical 03/22/2016  . DDD (degenerative disc disease), lumbar 03/22/2016  . GERD (gastroesophageal reflux disease)   . Hyperlipidemia   . Hypertension   . Rheumatoid arthritis (HCC)   . Rheumatoid arthritis(714.0)   . 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 Type II diabetes mellitus (HCC)     Past Surgical History:  Procedure Laterality Date  . APPENDECTOMY  1968  . CATARACT EXTRACTION W/ INTRAOCULAR LENS  IMPLANT, BILATERAL Bilateral 11/2013-12/2013  . KNEE ARTHROPLASTY    . KNEE ARTHROSCOPY Left 1980's-1990's X 3  . MYOMECTOMY  1990's    There were no vitals filed for this visit.   Subjective Assessment - 11/10/19 0921    Subjective Patient reports she is doing well, exercises are going good at home. She does continue to report left lower back continues to bother her while at work while standing for a long period of time.    Patient Stated Goals Relieve back pain    Currently in Pain? Yes    Pain Score 6     Pain Location Back    Pain Orientation  Lower;Left    Pain Descriptors / Indicators Tightness    Pain Type Chronic pain    Pain Onset More than a month ago    Pain Frequency Constant    Aggravating Factors  Standing              [facility] PT Assessment - 11/10/19 0001      Assessment   Medical Diagnosis Low back pain, left hip pain, bilateral knee pain    Referring Provider (PT) Gearldine Bienenstock, PA-C      Precautions   Precautions None      Restrictions   Weight Bearing Restrictions No      Balance Screen   Has the patient fallen in the past 6 months No      Prior Function   Level of Independence Independent    Vocation Full time employment      Cognition   Overall Cognitive Status Within Functional Limits for tasks assessed      Observation/Other Assessments   Observations Patient appears in no apparent distress    Focus on Therapeutic Outcomes (FOTO)  NA - can assess next visit for "no body part"      Sensation   Light Touch Appears Intact      AROM   Overall AROM Comments Patient reports left lower back tightness with all  movements    Lumbar Flexion WFL    Lumbar Extension 50%    Lumbar - Right Side Bend 75%    Lumbar - Left Side Bend 75%    Lumbar - Right Rotation 75%    Lumbar - Left Rotation 75%      Strength   Right Hip Extension 3+/5    Right Hip ABduction 3+/5    Left Hip Extension 3/5    Left Hip ABduction 3/5    Right Knee Flexion 4/5    Right Knee Extension 4/5    Left Knee Flexion 4/5    Left Knee Extension 4/5      Slump test   Findings Negative      Straight Leg Raise   Findings Negative                         [facility] Adult PT Treatment/Exercise - 11/10/19 0001      Exercises   Exercises Lumbar      Lumbar Exercises: Stretches   Passive Hamstring Stretch 30 seconds    Passive Hamstring Stretch Limitations supine    Single Knee to Chest Stretch 30 seconds    Single Knee to Chest Stretch Limitations supine    Lower Trunk Rotation 3 reps;10 seconds    Hip  Flexor Stretch 30 seconds    Hip Flexor Stretch Limitations supine edge of table    Piriformis Stretch 30 seconds    Piriformis Stretch Limitations supine      Lumbar Exercises: Aerobic   Nustep L5 x 5 min (LE only)      Lumbar Exercises: Standing   Other Standing Lumbar Exercises Bent over hip extension with yellow 2x10    Other Standing Lumbar Exercises Standing hip abduction with yellow 2x10      Lumbar Exercises: Seated   Sit to Stand 10 reps    Sit to Stand Limitations focused on control and proper hip hinge technique      Lumbar Exercises: Supine   Bridge 10 reps;3 seconds    Straight Leg Raise 10 reps;3 seconds      Lumbar Exercises: Sidelying   Clam 10 reps   2 sets     Lumbar Exercises: Quadruped   Straight Leg Raise 5 reps;3 seconds   3 sets   Straight Leg Raises Limitations on forearms      Manual Therapy   Manual Therapy Myofascial release    Myofascial Release Left lumbar paraspinals, instructed patient on using tennis ball to perform self TPR at home                  PT Education - 11/10/19 0924    Education Details HEP    Person(s) Educated Patient    Methods Explanation;Verbal cues    Comprehension Verbalized understanding;Returned demonstration;Need further instruction;Verbal cues required            PT Short Term Goals - 11/10/19 1315      PT SHORT TERM GOAL #1   Title Patient will be I with initial HEP to progress with PT    Time 4    Period Weeks    Status Achieved    Target Date 10/20/19      PT SHORT TERM GOAL #2   Title Patient will report pain while standing at work </= 5/10 to improve activity tolerance    Baseline Patient continues to report 6/10 pain level    Time 4    Period  Weeks    Status On-going    Target Date 12/08/19             PT Long Term Goals - 11/10/19 1316      PT LONG TERM GOAL #1   Title Patient will be I with final HEP to maintain progress from PT    Time 8    Period Weeks    Status On-going     Target Date 01/05/20      PT LONG TERM GOAL #2   Title Patient will exhibit improved hip strength grossly >/= 4/5 and knee strength grossly >/= 4+/5 MMT to improve ability to stand and walk longer periods    Baseline Patient continues to demonstrate gross hip and LE weakness    Time 8    Period Weeks    Status On-going    Target Date 01/05/20      PT LONG TERM GOAL #3   Title Patient will improve active lumbar motion by >/= 25% and report </=2/10 pain to improve lifting ability    Baseline Patient demonstrates improved motion but continues to report increased pain    Time 8    Period Weeks    Status On-going    Target Date 01/05/20      PT LONG TERM GOAL #4   Title Patient will report pain with all standing and working tasks </= 3/10    Baseline Patient continues to report 6/10 pain level    Time 8    Period Weeks    Status On-going    Target Date 01/05/20                 Plan - 11/10/19 0941    Clinical Impression Statement Patient tolerated therapy well with no adverse effects. Performed manual therapy for left lumbar paraspinals as patient reported tightness and she exhibit trigger points along that area with some relief following manual. Patient continues to exhibit strength deficit of L>R hips and core so incorporated quaduped hip/core strengthening and patient reported she feels this is more effective than the standing band strenthening. She is gradually progressing toward established goals but continues to report left lower back pain with work. She would benefit from continued skilled PT to progress strength and mobility so she can perform all walking and working tasks without pain or limitation.    PT Treatment/Interventions ADLs/Self Care Home Management;Cryotherapy;Electrical Stimulation;Moist Heat;Neuromuscular re-education;Balance training;Therapeutic exercise;Therapeutic activities;Functional mobility training;Stair training;Gait training;Patient/family  education;Manual techniques;Dry needling;Passive range of motion;Joint Manipulations;Spinal Manipulations    PT Next Visit Plan Assess HEP and progress PRN, lumbar and hip stretching, progress core and hip strengthening, lifting mechanics    PT Home Exercise Plan [id number]: seated hamstring stretch, supine piriformis stretch, supine hip flexor stretch, bridge, SLR, sit<>stand, quadruped hip extension, abduction with yellow, self TPR using ball    Consulted and Agree with Plan of Care Patient           Patient will benefit from skilled therapeutic intervention in order to improve the following deficits and impairments:  Abnormal gait, Difficulty walking, Decreased endurance, Pain, Decreased activity tolerance, Decreased strength, Postural dysfunction, Improper body mechanics, Impaired flexibility  Visit Diagnosis: Chronic bilateral low back pain, unspecified whether sciatica present  Pain in left hip  Chronic pain of right knee  Chronic pain of left knee  Muscle weakness (generalized)     Problem List Patient Active Problem List   Diagnosis Date Noted  . Unilateral primary osteoarthritis, left knee 01/10/2018  . Chronic  pain of left knee 01/10/2018  . DDD (degenerative disc disease), cervical 03/22/2016  . DDD (degenerative disc disease), lumbar 03/22/2016  . 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 Angioedema 07/15/2014  . Hypokalemia 07/15/2014  . Diabetes mellitus, controlled (HCC) 07/03/2012  . HTN (hypertension) 07/03/2012  . Rheumatoid arthritis (HCC) 07/03/2012  . Current chronic use of systemic steroids 07/03/2012  . 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 [place], [name], [address] Phone: [phone]   Fax:  [phone]  Name: Amberlin Pita MRN: [id number] Date of Birth: October 10, 1960

## 2019-11-12 ENCOUNTER — Other Ambulatory Visit: Payer: Self-pay

## 2019-11-12 ENCOUNTER — Ambulatory Visit (HOSPITAL_COMMUNITY)
Admission: RE | Admit: 2019-11-12 | Discharge: 2019-11-12 | Disposition: A | Discharge status: Home / Self Care | Payer: Medicare Other | Source: Ambulatory Visit | Attending: Rheumatology | Admitting: Rheumatology

## 2019-11-12 DIAGNOSIS — M0579 Rheumatoid arthritis with rheumatoid factor of multiple sites without organ or systems involvement: Secondary | ICD-10-CM

## 2019-11-12 MED ORDER — SODIUM CHLORIDE 0.9 % IV SOLN
6.0000 mg/kg | INTRAVENOUS | Status: DC
  Administered 2019-11-12: 500 mg via INTRAVENOUS
  Filled 2019-11-12: qty 50

## 2019-11-12 MED ORDER — ACETAMINOPHEN 325 MG PO TABS: 650.0000 mg | ORAL_TABLET | ORAL | Status: DC

## 2019-11-12 MED ORDER — DIPHENHYDRAMINE HCL 25 MG PO CAPS: 25.0000 mg | ORAL_CAPSULE | ORAL | Status: DC

## 2019-11-14 ENCOUNTER — Telehealth: Payer: Self-pay | Admitting: *Deleted

## 2019-11-14 NOTE — Telephone Encounter (Signed)
Attempted to contact the patient and left message for patient to call the office.  

## 2019-11-14 NOTE — Telephone Encounter (Signed)
Patient states she is having lower back pain. Patient states she has been going to physical therapy for her lower back since 09/22/2019. Patient states the pain is "like a fist twisting in her back".  Patient states sometimes the pain is going down her legs. Patient has been scheduled for 11/20/2019 at 1:15 pm.

## 2019-11-14 NOTE — Telephone Encounter (Signed)
Patient LMOM, patient having severe back pain, requesting call back [phone].

## 2019-11-17 NOTE — Progress Notes (Signed)
Office Visit Note  Patient: Wendy Woods             Date of Birth: 1960-10-21           MRN: [id number]             PCP: Fleet Contras, MD Referring: Fleet Contras, MD Visit Date: 11/20/2019 Occupation: @  Subjective:  Low back pain   History of Present Illness: Leotha Westermeyer is a 59 y.o. female with history of seropositive rheumatoid arthritis and osteoarthritis.  She is on Remicade IV infusions every 5 weeks.  She denies any recent scleritis flares.  She denies any rheumatoid arthritis flares.  She has occasional pain in both wrist joints, which is exacerbated by work in the [employer].  She denies any joint swelling. She presents today with increased pain in her lower back.  She continues to have trochanteric bursitis of the left hip.  She is currently going to PT, and she has been working on core strengthening exercises.    Activities of Daily Living:  Patient reports morning stiffness for  10 minutes.   Patient Reports nocturnal pain.  Difficulty dressing/grooming: Denies Difficulty climbing stairs: Reports Difficulty getting out of chair: Reports Difficulty using hands for taps, buttons, cutlery, and/or writing: Denies  Review of Systems  Constitutional: Positive for fatigue.  HENT: Positive for mouth dryness. Negative for mouth sores and nose dryness.   Eyes: Positive for dryness. Negative for pain and visual disturbance.  Respiratory: Negative for cough, hemoptysis, shortness of breath and difficulty breathing.   Cardiovascular: Negative for chest pain, palpitations, hypertension and swelling in legs/feet.  Gastrointestinal: Negative for blood in stool, constipation and diarrhea.  Endocrine: Negative for increased urination.  Genitourinary: Negative for painful urination.  Musculoskeletal: Positive for arthralgias, joint pain, morning stiffness and muscle tenderness. Negative for joint swelling, myalgias and myalgias.  Skin: Negative for color change, pallor,  rash, hair loss, nodules/bumps, skin tightness, ulcers and sensitivity to sunlight.  Allergic/Immunologic: Negative for susceptible to infections.  Neurological: Negative for dizziness, numbness, headaches and weakness.  Hematological: Negative for swollen glands.  Psychiatric/Behavioral: Negative for depressed mood and sleep disturbance. The patient is not nervous/anxious.     PMFS History:  Patient Active Problem List   Diagnosis Date Noted  . Unilateral primary osteoarthritis, left knee 01/10/2018  . Chronic pain of left knee 01/10/2018  . DDD (degenerative disc disease), cervical 03/22/2016  . DDD (degenerative disc disease), lumbar 03/22/2016  . 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 Angioedema 07/15/2014  . Hypokalemia 07/15/2014  . Diabetes mellitus, controlled (HCC) 07/03/2012  . HTN (hypertension) 07/03/2012  . Rheumatoid arthritis (HCC) 07/03/2012  . Current chronic use of systemic steroids 07/03/2012  . 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 DDD (degenerative disc disease), cervical 03/22/2016  . DDD (degenerative disc disease), lumbar 03/22/2016  . GERD (gastroesophageal reflux disease)   . Hyperlipidemia   . Hypertension   . Rheumatoid arthritis (HCC)   . Rheumatoid arthritis(714.0)   . 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 Type II diabetes mellitus (HCC)     Family History  Problem Relation Age of Onset  . Colon polyps Father   . Prostate cancer Father   . Breast cancer Cousin 26  . Colon cancer Neg Hx    Past Surgical History:  Procedure Laterality Date  . APPENDECTOMY  1968  . CATARACT EXTRACTION W/ INTRAOCULAR LENS  IMPLANT, BILATERAL Bilateral 11/2013-12/2013  . KNEE ARTHROPLASTY    .  KNEE ARTHROSCOPY Left 1980's-1990's X 3  . MYOMECTOMY  1990's   Social History   Social History Narrative  . Not on file    There is no immunization history on file for this patient.   Objective: Vital Signs: BP (!) 130/83 (BP Location: Left Arm,  Patient Position: Sitting, Cuff Size: Small)   Pulse 76   Resp 12   Ht 5\' 4"  (1.626 m)   Wt 179 lb 6.4 oz (81.4 kg)   LMP 07/09/2014   BMI 30.79 kg/m    Physical Exam Vitals and nursing note reviewed.  Constitutional:      Appearance: She is well-developed.  HENT:     Head: Normocephalic and atraumatic.  Eyes:     Conjunctiva/sclera: Conjunctivae normal.  Pulmonary:     Effort: Pulmonary effort is normal.  Abdominal:     General: Bowel sounds are normal.     Palpations: Abdomen is soft.  Musculoskeletal:     Cervical back: Normal range of motion.  Lymphadenopathy:     Cervical: No cervical adenopathy.  Skin:    General: Skin is warm and dry.     Capillary Refill: Capillary refill takes less than 2 seconds.  Neurological:     Mental Status: She is alert and oriented to person, place, and time.  Psychiatric:        Behavior: Behavior normal.      Musculoskeletal Exam:  C-spine limited lateral rotation.  Painful ROM of lumbar spine.  Midline spinal tenderness and tenderness over the left SI joint. Shoulder joint abduction to about 120 degrees bilaterally.  Limited range of motion and synovial thickening of both wrist joints noted.  MCPs, PIPs, and DIPs good range of motion with no synovitis.  She has complete fist formation bilaterally.  Knee joints have good range of motion with no warmth or effusion.  Tenderness of the right ankle joint.  Good ROM of both ankle joints.    CDAI Exam: CDAI Score: 0.8  Patient Global: 4 mm; Provider Global: 4 mm Swollen: 0 ; Tender: 0  Joint Exam 11/20/2019   No joint exam has been documented for this visit   There is currently no information documented on the homunculus. Go to the Rheumatology activity and complete the homunculus joint exam.  Investigation: No additional findings.  Imaging: XR Lumbar Spine 2-3 Views  Result Date: 11/20/2019 Levoscoliosis was noted.  Multilevel spondylosis and facet joint arthropathy was noted.  Impression: These findings are consistent with scoliosis, multilevel spondylosis and facet joint arthropathy.   Recent Labs: Lab Results  Component Value Date   WBC 6.1 10/01/2019   HGB 11.7 (L) 10/01/2019   PLT 355 10/01/2019   NA 136 10/01/2019   K 3.9 10/01/2019   CL 98 10/01/2019   CO2 30 10/01/2019   GLUCOSE 177 (H) 10/01/2019   BUN 19 10/01/2019   CREATININE 0.88 10/01/2019   BILITOT 1.0 10/01/2019   ALKPHOS 62 10/01/2019   AST 22 10/01/2019   ALT 14 10/01/2019   PROT 7.5 10/01/2019   ALBUMIN 3.4 (L) 10/01/2019   CALCIUM 9.2 10/01/2019   GFRAA >60 10/01/2019   QFTBGOLD Negative 09/18/2016   QFTBGOLDPLUS Negative 02/12/2019    Speciality Comments: Remicade 6mg / kg every 6 weeks TB gold negative 08/2016  Procedures:  No procedures performed Allergies: Latex, Lisinopril, Penicillins, and Methotrexate derivatives  CBC and CMP were drawn on 10/01/2019.  TB gold negative on 02/12/2019.  Future order for TB gold was placed today.   Assessment /  Plan:     Visit Diagnoses: Rheumatoid arthritis involving multiple sites with positive rheumatoid factor (HCC) - + RF, Anti-CCP +, ANA -: She has not had any recent rheumatoid arthritis flares.  She has no synovitis on exam.  She has limited range of motion and synovial thickening of both wrist joints but no active inflammation was noted.  She has tenderness of the right ankle joint but no warmth or inflammation was noted.  She is on Remicade IV infusions every 5 weeks which seems to be controlling her arthritis.  She continues to have morning stiffness for about 10 minutes and has occasional nocturnal pain.  She will continue receiving Remicade infusions as prescribed.  She was advised to notify us if she develops increased joint pain or joint swelling.  She will follow-up in the office in 5 months.  High risk medication use - Remicade IV infusion 6 mg/kg every 5 weeks.  Discontinue methotrexate due to GI side effects.  CBC and CMP were  drawn on 10/01/2019.  TB gold negative on 02/12/2019.  Future order for TB gold was placed today.- Plan: QuantiFERON-TB Gold Plus  Screening for tuberculosis -Future order for TB gold was placed today.  Plan: QuantiFERON-TB Gold Plus  Chronic midline low back pain with left-sided sciatica - She presents today with increased lower back pain and stiffness.  She has been going to physical therapy and working on core strengthening exercises but her discomfort has persisted.  X-rays of the lumbar spine were obtained today.  Plan: XR Lumbar Spine 2-3 Views.  X-rays showed multilevel spondylosis, scoliosis and facet joint arthropathy.  She has been complaining of left-sided radiculopathy.  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  Primary osteoarthritis of both hands: She has limited range of motion and synovial thickening of both wrist joints.  Complete fist formation bilaterally.  She experiences intermittent pain and stiffness in both hands and both wrists which is exacerbated by working in [employer].  Joint protection and muscle strengthening were discussed.  Primary osteoarthritis of both knees: Chronic pain.  She has been going to physical therapy and working only lower extremity muscle strengthening.  She has good range of motion of both knee joints on examination today.  No warmth or effusion was noted.  Trochanteric bursitis, left hip: She has tenderness palpation over the left trochanter bursa.  She has been going to physical therapy and working on stretching exercises.  Ganglion cyst of dorsum of left wrist  DDD (degenerative disc disease), cervical: She has limited lateral rotation bilaterally.  No symptoms of radiculopathy.  DDD (degenerative disc disease), lumbar: She presents today with increased lower back pain.  She has been experiencing radiating pain down her left lower extremity.  She has midline spinal tenderness in the lumbar region.  X-rays of lumbar  spine were obtained today.  She is currently getting physical therapy and has been working on core strengthening exercises.  Other medical conditions are listed as follows:  Essential hypertension  History of diabetes mellitus, type II  History of vitamin D deficiency    Orders: Orders Placed This Encounter  Procedures  . XR Lumbar Spine 2-3 Views  . QuantiFERON-TB Gold Plus   No orders of the defined types were placed in this encounter.   Face-to-face time spent with patient was 30 minutes. Greater than 50% of time was spent in counseling and coordination of care.  Follow-Up Instructions: Return in about 5 months (around 04/21/2020) for Rheumatoid arthritis,  Osteoarthritis.   Autumn Patty, PA-C  I examined and evaluated the patient with Sherron Ales PA.  Patient had no synovitis on examination.  She has been doing well on Remicade infusions.  She stopped methotrexate due to side effects.  She has been experiencing lower back pain radiating into her left lower extremity.  X-ray findings were discussed which showed multilevel spondylosis, facet joint arthropathy and scoliosis.  She has been going to physical therapy which is helpful.  She will continue physical therapy.  If her symptoms get worse or do not improve she will notify us.  The plan of care was discussed as noted above.  Pollyann Savoy, MD  Note - This record has been created using Animal nutritionist.  Chart creation errors have been sought, but may not always  have been located. Such creation errors do not reflect on  the standard of medical care.

## 2019-11-19 ENCOUNTER — Ambulatory Visit: Payer: Medicare Other | Admitting: Physical Therapy

## 2019-11-19 ENCOUNTER — Other Ambulatory Visit: Payer: Self-pay

## 2019-11-19 ENCOUNTER — Encounter: Payer: Self-pay | Admitting: Physical Therapy

## 2019-11-19 DIAGNOSIS — M545 Low back pain, unspecified: Secondary | ICD-10-CM

## 2019-11-19 DIAGNOSIS — M6281 Muscle weakness (generalized): Secondary | ICD-10-CM

## 2019-11-19 DIAGNOSIS — M25562 Pain in left knee: Secondary | ICD-10-CM

## 2019-11-19 DIAGNOSIS — G8929 Other chronic pain: Secondary | ICD-10-CM

## 2019-11-19 DIAGNOSIS — M25552 Pain in left hip: Secondary | ICD-10-CM

## 2019-11-19 NOTE — Therapy (Signed)
[facility] Outpatient Rehabilitation [facility] [address] [place], [name], [address] Phone: [phone]   Fax:  [phone]  Physical Therapy Treatment  Patient Details  Name: Wendy Woods MRN: [id number] Date of Birth: [birth year] Referring Provider (PT): Gearldine Bienenstock, PA-C   Encounter Date: 11/19/2019   PT End of Session - 11/19/19 0919    Visit Number 7    Number of Visits 14    Date for PT Re-Evaluation 01/05/20    Authorization Type [employer]    Progress Note Due on Visit 10    PT Start Time 0914    PT Stop Time 0955    PT Time Calculation (min) 41 min    Activity Tolerance Patient tolerated treatment well    Behavior During Therapy [facility] for tasks assessed/performed           Past Medical History:  Diagnosis Date  . DDD (degenerative disc disease), cervical 03/22/2016  . DDD (degenerative disc disease), lumbar 03/22/2016  . GERD (gastroesophageal reflux disease)   . Hyperlipidemia   . Hypertension   . Rheumatoid arthritis (HCC)   . Rheumatoid arthritis(714.0)   . 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 Type II diabetes mellitus (HCC)     Past Surgical History:  Procedure Laterality Date  . APPENDECTOMY  1968  . CATARACT EXTRACTION W/ INTRAOCULAR LENS  IMPLANT, BILATERAL Bilateral 11/2013-12/2013  . KNEE ARTHROPLASTY    . KNEE ARTHROSCOPY Left 1980's-1990's X 3  . MYOMECTOMY  1990's    There were no vitals filed for this visit.   Subjective Assessment - 11/19/19 0914    Subjective Patient reports she is doing well. The new exercise using the ball to roll out her back helps a lot. States on a little bit of pain today.    Patient Stated Goals Relieve back pain    Currently in Pain? Yes    Pain Score 5     Pain Location Back    Pain Orientation Lower;Left    Pain Descriptors / Indicators Tightness    Pain Type Chronic pain    Pain Onset More than a month ago    Pain Frequency Constant                              [facility] Adult PT Treatment/Exercise - 11/19/19 0001      Lumbar Exercises: Stretches   Lower Trunk Rotation 3 reps;10 seconds    Lower Trunk Rotation Limitations figure-4 position    Hip Flexor Stretch 2 reps;30 seconds    Hip Flexor Stretch Limitations supine edge of table    Piriformis Stretch 3 reps;30 seconds    Piriformis Stretch Limitations supine    Other Lumbar Stretch Exercise Child's pose stretch 2x30 sec, to left x 30 sec    Other Lumbar Stretch Exercise Patient instructed in standing lumbar stretching at sink, doorway, and forward bend to use at work      Lumbar Exercises: Aerobic   Nustep L5 x 5 min (LE only)      Lumbar Exercises: Supine   Bridge 10 reps;3 seconds    Bridge Limitations partial range, cue for PPT and glute engagement      Lumbar Exercises: Sidelying   Clam 15 reps   2 sets   Clam Limitations yellow band      Lumbar Exercises: Quadruped   Straight Leg Raise 5 reps;3 seconds    Straight Leg Raises Limitations on forearms  Opposite Arm/Leg Raise 5 reps;3 seconds   2 sets     Manual Therapy   Manual Therapy Myofascial release    Myofascial Release Left lumbar paraspinals, tennis ball to perform self TPR against wall                  PT Education - 11/19/19 0918    Education Details HEP, continue working on rolling out muscles of back    Person(s) Educated Patient    Methods Explanation;Demonstration;Verbal cues    Comprehension Verbalized understanding;Returned demonstration;Verbal cues required;Need further instruction            PT Short Term Goals - 11/10/19 1315      PT SHORT TERM GOAL #1   Title Patient will be I with initial HEP to progress with PT    Time 4    Period Weeks    Status Achieved    Target Date 10/20/19      PT SHORT TERM GOAL #2   Title Patient will report pain while standing at work </= 5/10 to improve activity tolerance    Baseline Patient continues to report 6/10 pain level    Time 4    Period Weeks     Status On-going    Target Date 12/08/19             PT Long Term Goals - 11/10/19 1316      PT LONG TERM GOAL #1   Title Patient will be I with final HEP to maintain progress from PT    Time 8    Period Weeks    Status On-going    Target Date 01/05/20      PT LONG TERM GOAL #2   Title Patient will exhibit improved hip strength grossly >/= 4/5 and knee strength grossly >/= 4+/5 MMT to improve ability to stand and walk longer periods    Baseline Patient continues to demonstrate gross hip and LE weakness    Time 8    Period Weeks    Status On-going    Target Date 01/05/20      PT LONG TERM GOAL #3   Title Patient will improve active lumbar motion by >/= 25% and report </=2/10 pain to improve lifting ability    Baseline Patient demonstrates improved motion but continues to report increased pain    Time 8    Period Weeks    Status On-going    Target Date 01/05/20      PT LONG TERM GOAL #4   Title Patient will report pain with all standing and working tasks </= 3/10    Baseline Patient continues to report 6/10 pain level    Time 8    Period Weeks    Status On-going    Target Date 01/05/20                 Plan - 11/19/19 0919    Clinical Impression Statement Patient tolerated therapy well with no adverse effects. Continued manual and stretching for left lumbar paraspinal musculature and hip/core strengthening. She was instructed on some stretches she could perform at work as that time seems to be the most aggrating for her while she is standing extended periods. She doesn't exhibit any radicular symptoms currently. She would benefit from continued skilled PT to progress strength and mobility so she can perform all walking and working tasks without pain or limitation.    PT Treatment/Interventions ADLs/Self Care Home Management;Cryotherapy;Electrical Stimulation;Moist Heat;Neuromuscular re-education;Balance training;Therapeutic exercise;Therapeutic activities;Functional  mobility training;Stair training;Gait training;Patient/family education;Manual techniques;Dry needling;Passive range of motion;Joint Manipulations;Spinal Manipulations    PT Next Visit Plan Assess HEP and progress PRN, lumbar and hip stretching, progress core and hip strengthening, lifting mechanics    PT Home Exercise Plan [email or web address]: seated hamstring stretch, supine piriformis stretch, supine hip flexor stretch, bridge, SLR, sit<>stand, quadruped hip extension, abduction with yellow, self TPR using ball, child's pose stretch, standing lumbar stretching at work    [employer] and Agree with Plan of Care Patient           Patient will benefit from skilled therapeutic intervention in order to improve the following deficits and impairments:  Abnormal gait, Difficulty walking, Decreased endurance, Pain, Decreased activity tolerance, Decreased strength, Postural dysfunction, Improper body mechanics, Impaired flexibility  Visit Diagnosis: Chronic bilateral low back pain, unspecified whether sciatica present  Pain in left hip  Chronic pain of right knee  Chronic pain of left knee  Muscle weakness (generalized)     Problem List Patient Active Problem List   Diagnosis Date Noted  . Unilateral primary osteoarthritis, left knee 01/10/2018  . Chronic pain of left knee 01/10/2018  . DDD (degenerative disc disease), cervical 03/22/2016  . DDD (degenerative disc disease), lumbar 03/22/2016  . 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 Angioedema 07/15/2014  . Hypokalemia 07/15/2014  . Diabetes mellitus, controlled (HCC) 07/03/2012  . HTN (hypertension) 07/03/2012  . Rheumatoid arthritis (HCC) 07/03/2012  . Current chronic use of systemic steroids 07/03/2012  . 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 [place], [name], [address] Phone:  [phone]   Fax:  [phone]  Name: Wendy Woods MRN: [id number] Date of Birth: 10/18/1960

## 2019-11-20 ENCOUNTER — Other Ambulatory Visit: Payer: Self-pay

## 2019-11-20 ENCOUNTER — Ambulatory Visit (INDEPENDENT_AMBULATORY_CARE_PROVIDER_SITE_OTHER): Payer: Medicare Other | Admitting: Rheumatology

## 2019-11-20 ENCOUNTER — Encounter: Payer: Self-pay | Admitting: Rheumatology

## 2019-11-20 ENCOUNTER — Ambulatory Visit: Payer: Self-pay

## 2019-11-20 VITALS — BP 130/83 | HR 76 | Resp 12 | Ht 64.0 in | Wt 179.4 lb

## 2019-11-20 DIAGNOSIS — M0579 Rheumatoid arthritis with rheumatoid factor of multiple sites without organ or systems involvement: Secondary | ICD-10-CM | POA: Diagnosis not present

## 2019-11-20 DIAGNOSIS — G8929 Other chronic pain: Secondary | ICD-10-CM

## 2019-11-20 DIAGNOSIS — Z111 Encounter for screening for respiratory tuberculosis: Secondary | ICD-10-CM

## 2019-11-20 DIAGNOSIS — M67432 Ganglion, left wrist: Secondary | ICD-10-CM

## 2019-11-20 DIAGNOSIS — Z79899 Other long term (current) drug therapy: Secondary | ICD-10-CM

## 2019-11-20 DIAGNOSIS — I10 Essential (primary) hypertension: Secondary | ICD-10-CM

## 2019-11-20 DIAGNOSIS — Z8639 Personal history of other endocrine, nutritional and metabolic disease: Secondary | ICD-10-CM

## 2019-11-20 DIAGNOSIS — M19041 Primary osteoarthritis, right hand: Secondary | ICD-10-CM | POA: Diagnosis not present

## 2019-11-20 DIAGNOSIS — M19042 Primary osteoarthritis, left hand: Secondary | ICD-10-CM

## 2019-11-20 DIAGNOSIS — M5442 Lumbago with sciatica, left side: Secondary | ICD-10-CM | POA: Diagnosis not present

## 2019-11-20 DIAGNOSIS — H15003 Unspecified scleritis, bilateral: Secondary | ICD-10-CM | POA: Diagnosis not present

## 2019-11-20 DIAGNOSIS — M170 Bilateral primary osteoarthritis of knee: Secondary | ICD-10-CM

## 2019-11-20 DIAGNOSIS — M5136 Other intervertebral disc degeneration, lumbar region: Secondary | ICD-10-CM

## 2019-11-20 DIAGNOSIS — M5030 Other cervical disc degeneration, unspecified cervical region: Secondary | ICD-10-CM

## 2019-11-20 DIAGNOSIS — M7062 Trochanteric bursitis, left hip: Secondary | ICD-10-CM

## 2019-11-20 NOTE — Patient Instructions (Signed)
Vaccines You are taking a medication(s) that can suppress your immune system.  The following immunizations are recommended: . Flu annually . Covid-19  o Please continue to wear your mask if you are on any medications that suppress your immune system o If you are on methotrexate, Cellcept (mycophenolate), Rinvoq, Xeljanz, and Olumiant - Hold medication for 1 week after each vaccine dose o If you are on Orencia Subcutaneous injections - Hold medication both one week prior to and one week after the first vaccine dose (only) o If you are on Orencia IV infusions - Time vaccine administration so that the first vaccination will occur four weeks after infusion . Pneumonia (Pneumovax 23 and Prevnar 13 spaced at least 1 year apart) . Shingrix (after age 50)  Please check with your PCP to make sure you are up to date.  

## 2019-11-24 ENCOUNTER — Ambulatory Visit: Payer: Medicare Other | Admitting: Physical Therapy

## 2019-11-24 ENCOUNTER — Encounter: Payer: Self-pay | Admitting: Physical Therapy

## 2019-11-24 ENCOUNTER — Other Ambulatory Visit: Payer: Self-pay

## 2019-11-24 DIAGNOSIS — G8929 Other chronic pain: Secondary | ICD-10-CM

## 2019-11-24 DIAGNOSIS — M25561 Pain in right knee: Secondary | ICD-10-CM

## 2019-11-24 DIAGNOSIS — M25552 Pain in left hip: Secondary | ICD-10-CM

## 2019-11-24 DIAGNOSIS — M545 Low back pain, unspecified: Secondary | ICD-10-CM

## 2019-11-24 DIAGNOSIS — R29898 Other symptoms and signs involving the musculoskeletal system: Secondary | ICD-10-CM

## 2019-11-24 DIAGNOSIS — M6281 Muscle weakness (generalized): Secondary | ICD-10-CM

## 2019-11-24 NOTE — Therapy (Signed)
[facility] [address] [place], [name], [address] Phone: [phone]   Fax:  [phone]  Physical Therapy Treatment  Patient Details  Name: Wendy Woods MRN: [id number] Date of Birth: [birth year] Referring Provider (PT): Gearldine Bienenstock, PA-C   Encounter Date: 11/24/2019   PT End of Session - 11/24/19 1008    Visit Number 8    Number of Visits 14    Date for PT Re-Evaluation 01/05/20    Authorization Type [employer]    Progress Note Due on Visit 16    PT Start Time 1000    PT Stop Time 1053    PT Time Calculation (min) 53 min    Activity Tolerance Patient tolerated treatment well    Behavior During Therapy [facility] for tasks assessed/performed           Past Medical History:  Diagnosis Date   DDD (degenerative disc disease), cervical 03/22/2016   DDD (degenerative disc disease), lumbar 03/22/2016   GERD (gastroesophageal reflux disease)    Hyperlipidemia    Hypertension    Rheumatoid arthritis (HCC)    Rheumatoid arthritis(714.0)    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   Subjective Assessment - 11/24/19 1006    Subjective Patient reports she went out of town this past weekend and driving for extended periods aggravated the left lower back region.    Patient Stated Goals Relieve back pain    Currently in Pain? Yes    Pain Score 8     Pain Location Back    Pain Orientation Lower;Left    Pain Descriptors / Indicators Tightness;Aching    Pain Type Chronic pain    Pain Onset More than a month ago    Pain Frequency Constant              [facility] PT Assessment - 11/24/19 0001       Palpation   Palpation comment TTP left lumbar paraspinals with palpable trigger points                         [facility] Adult PT Treatment/Exercise - 11/24/19 0001      Exercises   Exercises Lumbar      Lumbar Exercises: Stretches   Lower Trunk Rotation 3 reps;10 seconds    Lower Trunk Rotation Limitations figure-4 position    Hip Flexor Stretch 2 reps;30 seconds    Hip Flexor Stretch Limitations supine edge of table    Piriformis Stretch 2 reps;30 seconds    Piriformis Stretch Limitations supine    Other Lumbar Stretch Exercise Child's pose stretch 2x30 sec, to left x 30 sec      Lumbar Exercises: Aerobic   Nustep L5 x 5 min (LE only)      Lumbar Exercises: Supine   Bridge 10 reps;3 seconds    Bridge Limitations partial range, cue for PPT and glute engagement      Lumbar Exercises: Sidelying   Clam 15 reps    Clam Limitations yellow band      Lumbar Exercises: Quadruped   Opposite Arm/Leg Raise 5 reps;3 seconds  2 sets     Modalities   Modalities Electrical Stimulation;Moist Heat      Moist Heat Therapy   Number Minutes Moist Heat 15 Minutes    Moist Heat Location Lumbar Spine      Electrical Stimulation   Electrical Stimulation Location Lumbar    Electrical Stimulation Action IFC 80-150 x15 min    Electrical Stimulation Parameters Intensity to patient tolerance    Electrical Stimulation Goals Tone;Pain      Manual Therapy   Manual Therapy Myofascial release    Myofascial Release Left lumbar paraspinals TPR, tennis ball to perform self TPR against wall                  PT Education - 11/24/19 1008    Education Details HEP    Person(s) Educated Patient    Methods Explanation;Demonstration;Verbal cues    Comprehension Verbalized understanding;Returned demonstration;Verbal cues required;Need further instruction            PT Short Term Goals - 11/10/19 1315      PT SHORT TERM GOAL #1   Title Patient will be I with initial HEP  to progress with PT    Time 4    Period Weeks    Status Achieved    Target Date 10/20/19      PT SHORT TERM GOAL #2   Title Patient will report pain while standing at work </= 5/10 to improve activity tolerance    Baseline Patient continues to report 6/10 pain level    Time 4    Period Weeks    Status On-going    Target Date 12/08/19             PT Long Term Goals - 11/10/19 1316      PT LONG TERM GOAL #1   Title Patient will be I with final HEP to maintain progress from PT    Time 8    Period Weeks    Status On-going    Target Date 01/05/20      PT LONG TERM GOAL #2   Title Patient will exhibit improved hip strength grossly >/= 4/5 and knee strength grossly >/= 4+/5 MMT to improve ability to stand and walk longer periods    Baseline Patient continues to demonstrate gross hip and LE weakness    Time 8    Period Weeks    Status On-going    Target Date 01/05/20      PT LONG TERM GOAL #3   Title Patient will improve active lumbar motion by >/= 25% and report </=2/10 pain to improve lifting ability    Baseline Patient demonstrates improved motion but continues to report increased pain    Time 8    Period Weeks    Status On-going    Target Date 01/05/20      PT LONG TERM GOAL #4   Title Patient will report pain with all standing and working tasks </= 3/10    Baseline Patient continues to report 6/10 pain level    Time 8    Period Weeks    Status On-going    Target Date 01/05/20                 Plan - 11/24/19 1011    Clinical Impression Statement Patient tolerated therapy well with no adverse effects. She was having a little more pain this visit due to driving for extended periods. She continues to have left sided lumbar paraspinal tightness and reports  manual improves this. Continued with core and LE strengthening with good tolerance. Used e-stim and heat post-therapy to reduce tightness and discomfort. She would benefit from continued skilled PT to progress  strength and mobility so she can perform all walking and working tasks without pain or limitation.    PT Treatment/Interventions ADLs/Self Care Home Management;Cryotherapy;Electrical Stimulation;Moist Heat;Neuromuscular re-education;Balance training;Therapeutic exercise;Therapeutic activities;Functional mobility training;Stair training;Gait training;Patient/family education;Manual techniques;Dry needling;Passive range of motion;Joint Manipulations;Spinal Manipulations    PT Next Visit Plan Assess HEP and progress PRN, lumbar and hip stretching, progress core and hip strengthening, lifting mechanics    PT Home Exercise Plan [email or web address]: seated hamstring stretch, supine piriformis stretch, supine hip flexor stretch, child's pose stretch, bridge, SLR, sit<>stand, quadruped hip extension, abduction with yellow, self TPR using ball, child's pose stretch, standing lumbar stretching at work    [employer] and Agree with Plan of Care Patient           Patient will benefit from skilled therapeutic intervention in order to improve the following deficits and impairments:  Abnormal gait, Difficulty walking, Decreased endurance, Pain, Decreased activity tolerance, Decreased strength, Postural dysfunction, Improper body mechanics, Impaired flexibility  Visit Diagnosis: Chronic bilateral low back pain, unspecified whether sciatica present  Pain in left hip  Chronic pain of right knee  Chronic pain of left knee  Muscle weakness (generalized)  Other symptoms and signs involving the musculoskeletal system     Problem List Patient Active Problem List   Diagnosis Date Noted   Unilateral primary osteoarthritis, left knee 01/10/2018   Chronic pain of left knee 01/10/2018   DDD (degenerative disc disease), cervical 03/22/2016   DDD (degenerative disc disease), lumbar 03/22/2016   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   Angioedema 07/15/2014   Hypokalemia 07/15/2014   Diabetes mellitus, controlled (HCC) 07/03/2012     HTN (hypertension) 07/03/2012   Rheumatoid arthritis (HCC) 07/03/2012   Current chronic use of systemic steroids 07/03/2012   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

## 2019-12-08 ENCOUNTER — Other Ambulatory Visit: Payer: Self-pay

## 2019-12-08 ENCOUNTER — Encounter: Payer: Self-pay | Admitting: Physical Therapy

## 2019-12-08 ENCOUNTER — Ambulatory Visit: Payer: Medicare Other | Attending: Physician Assistant | Admitting: Physical Therapy

## 2019-12-08 DIAGNOSIS — G8929 Other chronic pain: Secondary | ICD-10-CM | POA: Diagnosis present

## 2019-12-08 DIAGNOSIS — M6281 Muscle weakness (generalized): Secondary | ICD-10-CM

## 2019-12-08 DIAGNOSIS — M545 Low back pain, unspecified: Secondary | ICD-10-CM

## 2019-12-08 DIAGNOSIS — M25552 Pain in left hip: Secondary | ICD-10-CM | POA: Insufficient documentation

## 2019-12-08 DIAGNOSIS — M25562 Pain in left knee: Secondary | ICD-10-CM | POA: Insufficient documentation

## 2019-12-08 DIAGNOSIS — M25561 Pain in right knee: Secondary | ICD-10-CM | POA: Diagnosis present

## 2019-12-08 DIAGNOSIS — R29898 Other symptoms and signs involving the musculoskeletal system: Secondary | ICD-10-CM | POA: Diagnosis present

## 2019-12-08 NOTE — Therapy (Signed)
[facility] [address] [place], [name], [address] Phone: [phone]   Fax:  [phone]  Physical Therapy Treatment  Patient Details  Name: Wendy Woods MRN: [id number] Date of Birth: 17-Dec-1960 Referring Provider (PT): Gearldine Bienenstock, PA-C   Encounter Date: 12/08/2019   PT End of Session - 12/08/19 1534    Visit Number 9    Number of Visits 14    Date for PT Re-Evaluation 01/05/20    Authorization Type [employer]    Progress Note Due on Visit 16    PT Start Time 1525    PT Stop Time 1605    PT Time Calculation (min) 40 min    Activity Tolerance Patient tolerated treatment well    Behavior During Therapy [facility] for tasks assessed/performed           Past Medical History:  Diagnosis Date  . DDD (degenerative disc disease), cervical 03/22/2016  . DDD (degenerative disc disease), lumbar 03/22/2016  . GERD (gastroesophageal reflux disease)   . Hyperlipidemia   . Hypertension   . Rheumatoid arthritis (HCC)   . Rheumatoid arthritis(714.0)   . 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 Type II diabetes mellitus (HCC)     Past Surgical History:  Procedure Laterality Date  . APPENDECTOMY  1968  . CATARACT EXTRACTION W/ INTRAOCULAR LENS  IMPLANT, BILATERAL Bilateral 11/2013-12/2013  . KNEE ARTHROPLASTY    . KNEE ARTHROSCOPY Left 1980's-1990's X 3  . MYOMECTOMY  1990's    There were no vitals filed for this visit.   Subjective Assessment - 12/08/19 1531    Subjective Patient reports she is doing well. Exercises are going good. She has been busy at work and she is sleepy today.    Patient Stated Goals Relieve back pain    Currently in Pain? Yes    Pain Score 5     Pain Location Back    Pain Orientation Lower;Left    Pain Descriptors / Indicators Tightness;Aching    Pain Type Chronic pain    Pain Onset More than a month ago    Pain Frequency Constant    Aggravating Factors  Standing, lifting    Effect of Pain on Daily  Activities Patient limited with activities at work                             [facility] Adult PT Treatment/Exercise - 12/08/19 0001      Exercises   Exercises Lumbar      Lumbar Exercises: Stretches   Passive Hamstring Stretch 2 reps;30 seconds    Passive Hamstring Stretch Limitations supine    Single Knee to Chest Stretch 2 reps;30 seconds    Single Knee to Chest Stretch Limitations supine    Lower Trunk Rotation 5 reps;10 seconds    Hip Flexor Stretch 2 reps;30 seconds    Hip Flexor Stretch Limitations supine edge of table    Piriformis Stretch 2 reps;30 seconds    Piriformis Stretch Limitations supine    Other Lumbar Stretch Exercise Child's pose stretch to left 2 x 30 sec      Lumbar Exercises: Aerobic   Nustep L5 x 5 min (LE only)      Lumbar Exercises: Standing   Lifting Limitations Deadlift with 15# on 6" box 2x10      Lumbar Exercises: Seated   Sit to Stand 10 reps   2 sets   Sit to Stand Limitations focused on  control and proper hip hinge technique      Lumbar Exercises: Supine   Bridge 10 reps;3 seconds   2 sets   Straight Leg Raise 10 reps   2 sets     Lumbar Exercises: Sidelying   Hip Abduction 10 reps   2 sets                 PT Education - 12/08/19 1534    Education Details HEP    Person(s) Educated Patient    Methods Explanation;Demonstration;Verbal cues    Comprehension Verbalized understanding;Returned demonstration;Verbal cues required;Need further instruction            PT Short Term Goals - 11/10/19 1315      PT SHORT TERM GOAL #1   Title Patient will be I with initial HEP to progress with PT    Time 4    Period Weeks    Status Achieved    Target Date 10/20/19      PT SHORT TERM GOAL #2   Title Patient will report pain while standing at work </= 5/10 to improve activity tolerance    Baseline Patient continues to report 6/10 pain level    Time 4    Period Weeks    Status On-going    Target Date 12/08/19               PT Long Term Goals - 11/10/19 1316      PT LONG TERM GOAL #1   Title Patient will be I with final HEP to maintain progress from PT    Time 8    Period Weeks    Status On-going    Target Date 01/05/20      PT LONG TERM GOAL #2   Title Patient will exhibit improved hip strength grossly >/= 4/5 and knee strength grossly >/= 4+/5 MMT to improve ability to stand and walk longer periods    Baseline Patient continues to demonstrate gross hip and LE weakness    Time 8    Period Weeks    Status On-going    Target Date 01/05/20      PT LONG TERM GOAL #3   Title Patient will improve active lumbar motion by >/= 25% and report </=2/10 pain to improve lifting ability    Baseline Patient demonstrates improved motion but continues to report increased pain    Time 8    Period Weeks    Status On-going    Target Date 01/05/20      PT LONG TERM GOAL #4   Title Patient will report pain with all standing and working tasks </= 3/10    Baseline Patient continues to report 6/10 pain level    Time 8    Period Weeks    Status On-going    Target Date 01/05/20                 Plan - 12/08/19 1536    Clinical Impression Statement Patient tolerated therapy well with no adverse effects. She did well this visit and did not report any increased pain with exercise. Continued with focus on mobility and strengthening, and initiated light lifting mechanics. She did require occasional cueing for hip hinge technique but able to perform properly following cues. She would benefit from continued skilled PT to progress strength and mobility so she can perform all walking and working tasks without pain or limitation.    PT Treatment/Interventions ADLs/Self Care Home Management;Cryotherapy;Electrical Stimulation;Moist Heat;Neuromuscular re-education;Balance training;Therapeutic exercise;Therapeutic  activities;Functional mobility training;Stair training;Gait training;Patient/family education;Manual  techniques;Dry needling;Passive range of motion;Joint Manipulations;Spinal Manipulations    PT Next Visit Plan Assess HEP and progress PRN, lumbar and hip stretching, progress core and hip strengthening, lifting mechanics    PT Home Exercise Plan [email or web address]: seated hamstring stretch, supine piriformis stretch, supine hip flexor stretch, child's pose stretch, bridge, SLR, sit<>stand, quadruped hip extension, abduction with yellow, self TPR using ball, child's pose stretch, standing lumbar stretching at work    [employer] and Agree with Plan of Care Patient           Patient will benefit from skilled therapeutic intervention in order to improve the following deficits and impairments:  Abnormal gait, Difficulty walking, Decreased endurance, Pain, Decreased activity tolerance, Decreased strength, Postural dysfunction, Improper body mechanics, Impaired flexibility  Visit Diagnosis: Chronic bilateral low back pain, unspecified whether sciatica present  Pain in left hip  Chronic pain of right knee  Chronic pain of left knee  Muscle weakness (generalized)     Problem List Patient Active Problem List   Diagnosis Date Noted  . Unilateral primary osteoarthritis, left knee 01/10/2018  . Chronic pain of left knee 01/10/2018  . DDD (degenerative disc disease), cervical 03/22/2016  . DDD (degenerative disc disease), lumbar 03/22/2016  . 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 Angioedema 07/15/2014  . Hypokalemia 07/15/2014  . Diabetes mellitus, controlled (HCC) 07/03/2012  . HTN (hypertension) 07/03/2012  . Rheumatoid arthritis (HCC) 07/03/2012  . Current chronic use of systemic steroids 07/03/2012  . 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

## 2019-12-12 NOTE — Progress Notes (Signed)
Next infusion scheduled 12/17/19.  Updated current orders to CBC with diff instead of CBC.   Verlin Fester, PharmD, [place], CPP Clinical Specialty Pharmacist (Rheumatology and Pulmonology)  12/12/2019 11:27 AM

## 2019-12-12 NOTE — Addendum Note (Signed)
Addended by: Verlin Fester C on: 12/12/2019 11:28 AM   Modules accepted: Orders, SmartSet

## 2019-12-15 ENCOUNTER — Other Ambulatory Visit: Payer: Self-pay

## 2019-12-15 ENCOUNTER — Encounter: Payer: Self-pay | Admitting: Physical Therapy

## 2019-12-15 ENCOUNTER — Ambulatory Visit: Payer: Medicare Other | Admitting: Physical Therapy

## 2019-12-15 DIAGNOSIS — M545 Low back pain, unspecified: Secondary | ICD-10-CM

## 2019-12-15 DIAGNOSIS — G8929 Other chronic pain: Secondary | ICD-10-CM

## 2019-12-15 DIAGNOSIS — M25561 Pain in right knee: Secondary | ICD-10-CM

## 2019-12-15 DIAGNOSIS — M25552 Pain in left hip: Secondary | ICD-10-CM

## 2019-12-15 DIAGNOSIS — M25562 Pain in left knee: Secondary | ICD-10-CM

## 2019-12-15 DIAGNOSIS — M6281 Muscle weakness (generalized): Secondary | ICD-10-CM

## 2019-12-15 NOTE — Therapy (Signed)
[facility] [address] [place], [name], [address] Phone: [phone]   Fax:  [phone]  Physical Therapy Treatment  Patient Details  Name: Wendy Woods MRN: [id number] Date of Birth: Dec 25, 1960 Referring Provider (PT): Gearldine Bienenstock, PA-C   Encounter Date: 12/15/2019   PT End of Session - 12/15/19 0823    Visit Number 10    Number of Visits 14    Date for PT Re-Evaluation 01/05/20    Authorization Type [employer]ICARE    Progress Note Due on Visit 16    PT Start Time 0830    PT Stop Time 0910    PT Time Calculation (min) 40 min    Activity Tolerance Patient tolerated treatment well    Behavior During Therapy [facility] for tasks assessed/performed           Past Medical History:  Diagnosis Date  . DDD (degenerative disc disease), cervical 03/22/2016  . DDD (degenerative disc disease), lumbar 03/22/2016  . GERD (gastroesophageal reflux disease)   . Hyperlipidemia   . Hypertension   . Rheumatoid arthritis (HCC)   . Rheumatoid arthritis(714.0)   . 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 Type II diabetes mellitus (HCC)     Past Surgical History:  Procedure Laterality Date  . APPENDECTOMY  1968  . CATARACT EXTRACTION W/ INTRAOCULAR LENS  IMPLANT, BILATERAL Bilateral 11/2013-12/2013  . KNEE ARTHROPLASTY    . KNEE ARTHROSCOPY Left 1980's-1990's X 3  . MYOMECTOMY  1990's    There were no vitals filed for this visit.   Subjective Assessment - 12/15/19 0822    Subjective Patient reports she is feeling a little achy today due to the weather.    Patient Stated Goals Relieve back pain    Currently in Pain? Yes    Pain Score 5     Pain Location Back    Pain Orientation Lower;Left    Pain Descriptors / Indicators Tightness;Aching    Pain Type Chronic pain    Pain Onset More than a month ago    Pain Frequency Intermittent                             [facility] Adult PT Treatment/Exercise - 12/15/19 0001       Exercises   Exercises Lumbar      Lumbar Exercises: Stretches   Passive Hamstring Stretch 2 reps;30 seconds    Passive Hamstring Stretch Limitations seated edge of mat    Single Knee to Chest Stretch 30 seconds    Single Knee to Chest Stretch Limitations supine    Lower Trunk Rotation 3 reps;10 seconds    Hip Flexor Stretch 2 reps;30 seconds    Hip Flexor Stretch Limitations supine edge of table    Piriformis Stretch 2 reps;30 seconds    Piriformis Stretch Limitations supine    Other Lumbar Stretch Exercise Seated child's pose stretch with physioball 3 x 20 sec each    Other Lumbar Stretch Exercise Standing QL stretch in doorfram 2 x 30 sec      Lumbar Exercises: Aerobic   Nustep L5 x 5 min (LE only)      Lumbar Exercises: Standing   Lifting Limitations Deadlift with 25# on 6" box 3x10      Lumbar Exercises: Seated   Sit to Stand 10 reps   2 sets   Sit to Stand Limitations focused on control and proper hip hinge technique  Lumbar Exercises: Quadruped   Opposite Arm/Leg Raise 5 reps;3 seconds   2 sets                 PT Education - 12/15/19 0823    Education Details HEP    Person(s) Educated Patient    Methods Explanation;Demonstration;Verbal cues    Comprehension Verbalized understanding;Returned demonstration;Verbal cues required;Need further instruction            PT Short Term Goals - 11/10/19 1315      PT SHORT TERM GOAL #1   Title Patient will be I with initial HEP to progress with PT    Time 4    Period Weeks    Status Achieved    Target Date 10/20/19      PT SHORT TERM GOAL #2   Title Patient will report pain while standing at work </= 5/10 to improve activity tolerance    Baseline Patient continues to report 6/10 pain level    Time 4    Period Weeks    Status On-going    Target Date 12/08/19             PT Long Term Goals - 11/10/19 1316      PT LONG TERM GOAL #1   Title Patient will be I with final HEP to maintain progress from PT     Time 8    Period Weeks    Status On-going    Target Date 01/05/20      PT LONG TERM GOAL #2   Title Patient will exhibit improved hip strength grossly >/= 4/5 and knee strength grossly >/= 4+/5 MMT to improve ability to stand and walk longer periods    Baseline Patient continues to demonstrate gross hip and LE weakness    Time 8    Period Weeks    Status On-going    Target Date 01/05/20      PT LONG TERM GOAL #3   Title Patient will improve active lumbar motion by >/= 25% and report </=2/10 pain to improve lifting ability    Baseline Patient demonstrates improved motion but continues to report increased pain    Time 8    Period Weeks    Status On-going    Target Date 01/05/20      PT LONG TERM GOAL #4   Title Patient will report pain with all standing and working tasks </= 3/10    Baseline Patient continues to report 6/10 pain level    Time 8    Period Weeks    Status On-going    Target Date 01/05/20                 Plan - 12/15/19 0823    Clinical Impression Statement Patient tolerated therapy well with no adverse effects. Continued with core/hip strengthening and lifting with good tolerance and patient did not report any increase in pain. Patient continues to exhibit gross strength deficits but is improving with how much she is able to tolerate. She continues to report extended periods of standing bring on her symptoms especially at work so she was provided a new stretch in doorway to help alleviate some of her left lower back tightness. She would benefit from continued skilled PT to progress strength and mobility so she can perform all walking and working tasks without pain or limitation.    PT Treatment/Interventions ADLs/Self Care Home Management;Cryotherapy;Electrical Stimulation;Moist Heat;Neuromuscular re-education;Balance training;Therapeutic exercise;Therapeutic activities;Functional mobility training;Stair training;Gait training;Patient/family education;Manual  techniques;Dry needling;Passive range  of motion;Joint Manipulations;Spinal Manipulations    PT Next Visit Plan Assess HEP and progress PRN, lumbar and hip stretching, progress core and hip strengthening, lifting mechanics    PT Home Exercise Plan [email or web address]: seated hamstring stretch, supine piriformis stretch, supine hip flexor stretch, child's pose stretch, bridge, SLR, sit<>stand, quadruped hip extension, abduction with yellow, self TPR using ball, child's pose stretch, standing lumbar stretching at work    [employer] and Agree with Plan of Care Patient           Patient will benefit from skilled therapeutic intervention in order to improve the following deficits and impairments:  Abnormal gait, Difficulty walking, Decreased endurance, Pain, Decreased activity tolerance, Decreased strength, Postural dysfunction, Improper body mechanics, Impaired flexibility  Visit Diagnosis: Chronic bilateral low back pain, unspecified whether sciatica present  Pain in left hip  Chronic pain of right knee  Chronic pain of left knee  Muscle weakness (generalized)     Problem List Patient Active Problem List   Diagnosis Date Noted  . Unilateral primary osteoarthritis, left knee 01/10/2018  . Chronic pain of left knee 01/10/2018  . DDD (degenerative disc disease), cervical 03/22/2016  . DDD (degenerative disc disease), lumbar 03/22/2016  . 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 Angioedema 07/15/2014  . Hypokalemia 07/15/2014  . Diabetes mellitus, controlled (HCC) 07/03/2012  . HTN (hypertension) 07/03/2012  . Rheumatoid arthritis (HCC) 07/03/2012  . Current chronic use of systemic steroids 07/03/2012  . 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

## 2019-12-17 ENCOUNTER — Ambulatory Visit (HOSPITAL_COMMUNITY)
Admission: RE | Admit: 2019-12-17 | Discharge: 2019-12-17 | Disposition: A | Discharge status: Home / Self Care | Payer: Medicare Other | Source: Ambulatory Visit | Attending: Rheumatology | Admitting: Rheumatology

## 2019-12-17 ENCOUNTER — Other Ambulatory Visit: Payer: Self-pay

## 2019-12-17 DIAGNOSIS — M0579 Rheumatoid arthritis with rheumatoid factor of multiple sites without organ or systems involvement: Secondary | ICD-10-CM | POA: Diagnosis not present

## 2019-12-17 LAB — CBC WITH DIFFERENTIAL/PLATELET
Abs Immature Granulocytes: 0.04 10*3/uL (ref 0.00–0.07)
Basophils Absolute: 0 10*3/uL (ref 0.0–0.1)
Basophils Relative: 1 %
Eosinophils Absolute: 0.4 10*3/uL (ref 0.0–0.5)
Eosinophils Relative: 5 %
HCT: 34.9 % — ABNORMAL LOW (ref 36.0–46.0)
Hemoglobin: 11.3 g/dL — ABNORMAL LOW (ref 12.0–15.0)
Immature Granulocytes: 1 %
Lymphocytes Relative: 34 %
Lymphs Abs: 2.5 10*3/uL (ref 0.7–4.0)
MCH: 29.2 pg (ref 26.0–34.0)
MCHC: 32.4 g/dL (ref 30.0–36.0)
MCV: 90.2 fL (ref 80.0–100.0)
Monocytes Absolute: 0.5 10*3/uL (ref 0.1–1.0)
Monocytes Relative: 7 %
Neutro Abs: 3.9 10*3/uL (ref 1.7–7.7)
Neutrophils Relative %: 52 %
Platelets: 402 10*3/uL — ABNORMAL HIGH (ref 150–400)
RBC: 3.87 MIL/uL (ref 3.87–5.11)
RDW: 13.3 % (ref 11.5–15.5)
WBC: 7.4 10*3/uL (ref 4.0–10.5)
nRBC: 0 % (ref 0.0–0.2)

## 2019-12-17 LAB — COMPREHENSIVE METABOLIC PANEL
ALT: 16 U/L (ref 0–44)
AST: 16 U/L (ref 15–41)
Albumin: 3.6 g/dL (ref 3.5–5.0)
Alkaline Phosphatase: 60 U/L (ref 38–126)
Anion gap: 9 (ref 5–15)
BUN: 13 mg/dL (ref 6–20)
CO2: 30 mmol/L (ref 22–32)
Calcium: 9.8 mg/dL (ref 8.9–10.3)
Chloride: 98 mmol/L (ref 98–111)
Creatinine, Ser: 0.9 mg/dL (ref 0.44–1.00)
GFR calc Af Amer: >60 mL/min (ref >60)
GFR calc non Af Amer: >60 mL/min (ref >60)
Glucose, Bld: 203 mg/dL — ABNORMAL HIGH (ref 70–99)
Potassium: 3.7 mmol/L (ref 3.5–5.1)
Sodium: 137 mmol/L (ref 135–145)
Total Bilirubin: 0.2 mg/dL — ABNORMAL LOW (ref 0.3–1.2)
Total Protein: 8.2 g/dL — ABNORMAL HIGH (ref 6.5–8.1)

## 2019-12-17 MED ORDER — ACETAMINOPHEN 325 MG PO TABS: 650.0000 mg | ORAL_TABLET | ORAL | Status: DC

## 2019-12-17 MED ORDER — DIPHENHYDRAMINE HCL 25 MG PO CAPS: 25.0000 mg | ORAL_CAPSULE | ORAL | Status: DC

## 2019-12-17 MED ORDER — SODIUM CHLORIDE 0.9 % IV SOLN
6.0000 mg/kg | INTRAVENOUS | Status: DC
  Administered 2019-12-17: 500 mg via INTRAVENOUS
  Filled 2019-12-17: qty 50

## 2019-12-22 ENCOUNTER — Encounter: Payer: Self-pay | Admitting: Physical Therapy

## 2019-12-22 ENCOUNTER — Ambulatory Visit: Payer: Medicare Other | Admitting: Physical Therapy

## 2019-12-22 ENCOUNTER — Other Ambulatory Visit: Payer: Self-pay

## 2019-12-22 DIAGNOSIS — G8929 Other chronic pain: Secondary | ICD-10-CM

## 2019-12-22 DIAGNOSIS — M6281 Muscle weakness (generalized): Secondary | ICD-10-CM

## 2019-12-22 DIAGNOSIS — M25561 Pain in right knee: Secondary | ICD-10-CM

## 2019-12-22 DIAGNOSIS — R29898 Other symptoms and signs involving the musculoskeletal system: Secondary | ICD-10-CM

## 2019-12-22 DIAGNOSIS — M545 Low back pain, unspecified: Secondary | ICD-10-CM

## 2019-12-22 DIAGNOSIS — M25552 Pain in left hip: Secondary | ICD-10-CM

## 2019-12-22 NOTE — Therapy (Signed)
[facility] [address] [place], [name], [address] Phone: [phone]   Fax:  [phone]  Physical Therapy Treatment  Patient Details  Name: Wendy Woods MRN: [id number] Date of Birth: 16-Nov-1960 Referring Provider (PT): Gearldine Bienenstock, PA-C   Encounter Date: 12/22/2019   PT End of Session - 12/22/19 0910    Visit Number 11    Number of Visits 14    Date for PT Re-Evaluation 01/05/20    Authorization Type [employer]ICARE    Progress Note Due on Visit 16    PT Start Time 0915    PT Stop Time 0955    PT Time Calculation (min) 40 min    Activity Tolerance Patient tolerated treatment well    Behavior During Therapy [facility] for tasks assessed/performed           Past Medical History:  Diagnosis Date  . DDD (degenerative disc disease), cervical 03/22/2016  . DDD (degenerative disc disease), lumbar 03/22/2016  . GERD (gastroesophageal reflux disease)   . Hyperlipidemia   . Hypertension   . Rheumatoid arthritis (HCC)   . Rheumatoid arthritis(714.0)   . 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 Type II diabetes mellitus (HCC)     Past Surgical History:  Procedure Laterality Date  . APPENDECTOMY  1968  . CATARACT EXTRACTION W/ INTRAOCULAR LENS  IMPLANT, BILATERAL Bilateral 11/2013-12/2013  . KNEE ARTHROPLASTY    . KNEE ARTHROSCOPY Left 1980's-1990's X 3  . MYOMECTOMY  1990's    There were no vitals filed for this visit.   Subjective Assessment - 12/22/19 0909    Subjective Patient reports she is sore and sleepy today due to having her vaccine booster shot. States her back is tight because she has been sleeping a lot and didn't do anything over the weekend.    Currently in Pain? Yes    Pain Score 5     Pain Location Back    Pain Orientation Lower;Left    Pain Descriptors / Indicators Aching;Tightness    Pain Type Chronic pain    Pain Onset More than a month ago    Pain Frequency Intermittent    Aggravating Factors  Standing  extended periods, lifting              [facility] PT Assessment - 12/22/19 0001      Assessment   Medical Diagnosis Low back pain, left hip pain, bilateral knee pain    Referring Provider (PT) Gearldine Bienenstock, PA-C      AROM   Overall AROM Comments Lumbar AROM grossly [facility] - left lower back tightness with all movement                         [facility] Adult PT Treatment/Exercise - 12/22/19 0001      Exercises   Exercises Lumbar      Lumbar Exercises: Stretches   Passive Hamstring Stretch 2 reps;30 seconds    Passive Hamstring Stretch Limitations seated edge of mat    Single Knee to Chest Stretch 2 reps;30 seconds    Single Knee to Chest Stretch Limitations supine    Lower Trunk Rotation 5 reps;10 seconds    Hip Flexor Stretch 2 reps;30 seconds    Hip Flexor Stretch Limitations supine edge of table    Piriformis Stretch 2 reps;30 seconds    Piriformis Stretch Limitations supine    Other Lumbar Stretch Exercise Standing QL stretch in doorfram 2 x 30 sec  Lumbar Exercises: Aerobic   Nustep L5 x 5 min (LE only)      Lumbar Exercises: Standing   Lifting Limitations Deadlift with 25# on 6" box 3x10      Lumbar Exercises: Supine   Clam 10 reps;3 seconds   2 sets   Clam Limitations green band    Bridge 10 reps;3 seconds      Lumbar Exercises: Quadruped   Opposite Arm/Leg Raise 10 reps;3 seconds   2 sets   Opposite Arm/Leg Raise Limitations performed on forearms                  PT Education - 12/22/19 0909    Education Details HEP    Person(s) Educated Patient    Methods Explanation;Demonstration;Verbal cues    Comprehension Verbalized understanding;Returned demonstration;Verbal cues required;Need further instruction            PT Short Term Goals - 11/10/19 1315      PT SHORT TERM GOAL #1   Title Patient will be I with initial HEP to progress with PT    Time 4    Period Weeks    Status Achieved    Target Date 10/20/19      PT SHORT TERM  GOAL #2   Title Patient will report pain while standing at work </= 5/10 to improve activity tolerance    Baseline Patient continues to report 6/10 pain level    Time 4    Period Weeks    Status On-going    Target Date 12/08/19             PT Long Term Goals - 11/10/19 1316      PT LONG TERM GOAL #1   Title Patient will be I with final HEP to maintain progress from PT    Time 8    Period Weeks    Status On-going    Target Date 01/05/20      PT LONG TERM GOAL #2   Title Patient will exhibit improved hip strength grossly >/= 4/5 and knee strength grossly >/= 4+/5 MMT to improve ability to stand and walk longer periods    Baseline Patient continues to demonstrate gross hip and LE weakness    Time 8    Period Weeks    Status On-going    Target Date 01/05/20      PT LONG TERM GOAL #3   Title Patient will improve active lumbar motion by >/= 25% and report </=2/10 pain to improve lifting ability    Baseline Patient demonstrates improved motion but continues to report increased pain    Time 8    Period Weeks    Status On-going    Target Date 01/05/20      PT LONG TERM GOAL #4   Title Patient will report pain with all standing and working tasks </= 3/10    Baseline Patient continues to report 6/10 pain level    Time 8    Period Weeks    Status On-going    Target Date 01/05/20                 Plan - 12/22/19 0910    Clinical Impression Statement Patient tolerated therapy well with no adverse effects. Patient reported feeling limited in therapy this visit due to being tired from her vaccine. Continues to work on hip/core strengthening as tolerated and patient does seem to be progressing with her exercises. She did not report any increase in back pain  this visit. She would benefit from continued skilled PT to progress strength and mobility so she can perform all walking and working tasks without pain or limitation.    PT Treatment/Interventions ADLs/Self Care Home  Management;Cryotherapy;Electrical Stimulation;Moist Heat;Neuromuscular re-education;Balance training;Therapeutic exercise;Therapeutic activities;Functional mobility training;Stair training;Gait training;Patient/family education;Manual techniques;Dry needling;Passive range of motion;Joint Manipulations;Spinal Manipulations    PT Next Visit Plan Assess HEP and progress PRN, lumbar and hip stretching, progress core and hip strengthening, lifting mechanics    PT Home Exercise Plan [email or web address]: seated hamstring stretch, supine piriformis stretch, supine hip flexor stretch, child's pose stretch, bridge, SLR, sit<>stand, quadruped hip extension, abduction with yellow, self TPR using ball, child's pose stretch, standing lumbar stretching at work    [employer] and Agree with Plan of Care Patient           Patient will benefit from skilled therapeutic intervention in order to improve the following deficits and impairments:  Abnormal gait, Difficulty walking, Decreased endurance, Pain, Decreased activity tolerance, Decreased strength, Postural dysfunction, Improper body mechanics, Impaired flexibility  Visit Diagnosis: Chronic bilateral low back pain, unspecified whether sciatica present  Pain in left hip  Chronic pain of right knee  Chronic pain of left knee  Muscle weakness (generalized)  Other symptoms and signs involving the musculoskeletal system     Problem List Patient Active Problem List   Diagnosis Date Noted  . Unilateral primary osteoarthritis, left knee 01/10/2018  . Chronic pain of left knee 01/10/2018  . DDD (degenerative disc disease), cervical 03/22/2016  . DDD (degenerative disc disease), lumbar 03/22/2016  . 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 Angioedema 07/15/2014  . Hypokalemia 07/15/2014  . Diabetes mellitus, controlled (HCC) 07/03/2012  . HTN (hypertension) 07/03/2012  . Rheumatoid arthritis (HCC) 07/03/2012  . Current chronic use of systemic steroids 07/03/2012  . Hypercalcemia  due to a drug 07/03/2012    Rosana Hoes, PT, DPT, LAT, ATC 12/22/19  9:58 AM Phone: [phone] Fax: [phone]   [facility] Out[facility] [address] [place], [name], [address] Phone: [phone]   Fax:  [phone]  Name: Wendy Woods MRN: [id number] Date of Birth: 11/30/1960

## 2019-12-29 ENCOUNTER — Other Ambulatory Visit: Payer: Self-pay

## 2019-12-29 ENCOUNTER — Encounter: Payer: Self-pay | Admitting: Physical Therapy

## 2019-12-29 ENCOUNTER — Ambulatory Visit: Payer: Medicare Other | Admitting: Physical Therapy

## 2019-12-29 DIAGNOSIS — M25552 Pain in left hip: Secondary | ICD-10-CM

## 2019-12-29 DIAGNOSIS — G8929 Other chronic pain: Secondary | ICD-10-CM

## 2019-12-29 DIAGNOSIS — M6281 Muscle weakness (generalized): Secondary | ICD-10-CM

## 2019-12-29 DIAGNOSIS — M545 Low back pain, unspecified: Secondary | ICD-10-CM

## 2019-12-29 DIAGNOSIS — R29898 Other symptoms and signs involving the musculoskeletal system: Secondary | ICD-10-CM

## 2019-12-29 DIAGNOSIS — M25562 Pain in left knee: Secondary | ICD-10-CM

## 2019-12-29 NOTE — Therapy (Signed)
[facility] Outpatient Rehabilitation [facility] [address] [place], [name], [address] Phone: [phone]   Fax:  [phone]  Physical Therapy Treatment / ERO   Progress Note Reporting Period 11/10/2019 to 12/29/2019  See note below for Objective Data and Assessment of Progress/Goals.    Patient Details  Name: Wendy Woods MRN: [id number] Date of Birth: 02-26-1961 Referring Provider (PT): Gearldine Bienenstock, PA-C   Encounter Date: 12/29/2019   PT End of Session - 12/29/19 0836    Visit Number 12    Number of Visits 14    Date for PT Re-Evaluation 01/26/20    Authorization Type [employer]    Progress Note Due on Visit 22    PT Start Time 0831    PT Stop Time 0910    PT Time Calculation (min) 39 min    Activity Tolerance Patient tolerated treatment well    Behavior During Therapy [facility] for tasks assessed/performed           Past Medical History:  Diagnosis Date  . DDD (degenerative disc disease), cervical 03/22/2016  . DDD (degenerative disc disease), lumbar 03/22/2016  . GERD (gastroesophageal reflux disease)   . Hyperlipidemia   . Hypertension   . Rheumatoid arthritis (HCC)   . Rheumatoid arthritis(714.0)   . 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 Type II diabetes mellitus (HCC)     Past Surgical History:  Procedure Laterality Date  . APPENDECTOMY  1968  . CATARACT EXTRACTION W/ INTRAOCULAR LENS  IMPLANT, BILATERAL Bilateral 11/2013-12/2013  . KNEE ARTHROPLASTY    . KNEE ARTHROSCOPY Left 1980's-1990's X 3  . MYOMECTOMY  1990's    There were no vitals filed for this visit.   Subjective Assessment - 12/29/19 0835    Subjective Patient reports some anterior left thigh pain this visit. Otherwise she is doing well. She is still feeling tired.    Patient Stated Goals Relieve back pain    Currently in Pain? Yes    Pain Score 4     Pain Location Back    Pain Orientation Lower;Left    Pain Descriptors / Indicators Tightness    Pain Type Chronic  pain    Pain Onset More than a month ago    Pain Frequency Intermittent    Aggravating Factors  Standing extended periods, lifting              [facility] PT Assessment - 12/29/19 0001      Assessment   Medical Diagnosis Low back pain, left hip pain, bilateral knee pain    Referring Provider (PT) Gearldine Bienenstock, PA-C      Precautions   Precautions None      Restrictions   Weight Bearing Restrictions No      Balance Screen   Has the patient fallen in the past 6 months No    Has the patient had a decrease in activity level because of a fear of falling?  No    Is the patient reluctant to leave their home because of a fear of falling?  No      Prior Function   Level of Independence Independent      Observation/Other Assessments   Focus on Therapeutic Outcomes (FOTO)  NA      AROM   Overall AROM Comments Lumbar AROM grossly WFL - left lower back tightness with all movement      Strength   Right Hip Extension 4-/5    Right Hip ABduction 3+/5    Left Hip  Extension 3+/5    Left Hip ABduction 3+/5      Slump test   Findings Negative      Straight Leg Raise   Findings Negative                         [facility] Adult PT Treatment/Exercise - 12/29/19 0001      Lumbar Exercises: Stretches   Passive Hamstring Stretch 30 seconds    Passive Hamstring Stretch Limitations seated edge of mat    Lower Trunk Rotation 3 reps;10 seconds    Hip Flexor Stretch 2 reps;30 seconds    Hip Flexor Stretch Limitations supine edge of table    Piriformis Stretch 2 reps;30 seconds    Piriformis Stretch Limitations supine    Other Lumbar Stretch Exercise Lumbar flexion stretch at sink 2 x 30 sec    Other Lumbar Stretch Exercise Standing QL stretch in doorfram 2 x 30 sec      Lumbar Exercises: Aerobic   Nustep L5 x 5 min (LE only)      Lumbar Exercises: Seated   Sit to Stand 10 reps      Lumbar Exercises: Supine   Clam 10 reps;3 seconds    Clam Limitations green band    Bridge  10 reps;3 seconds    Straight Leg Raise 10 reps      Lumbar Exercises: Quadruped   Opposite Arm/Leg Raise 10 reps;3 seconds    Opposite Arm/Leg Raise Limitations performed on forearms                  PT Education - 12/29/19 0836    Education Details Update POC, HEP consistency    Person(s) Educated Patient    Methods Explanation    Comprehension Verbalized understanding;Need further instruction            PT Short Term Goals - 12/29/19 0853      PT SHORT TERM GOAL #1   Title Patient will be I with initial HEP to progress with PT    Time 4    Period Weeks    Status Achieved    Target Date 10/20/19      PT SHORT TERM GOAL #2   Title Patient will report pain while standing at work </= 5/10 to improve activity tolerance    Baseline Patient reports 4/10 pain level this vist    Time 4    Period Weeks    Status Achieved    Target Date 12/08/19             PT Long Term Goals - 12/29/19 0854      PT LONG TERM GOAL #1   Title Patient will be I with final HEP to maintain progress from PT    Baseline Patient is not consistent with HEP    Time 4    Period Weeks    Status On-going    Target Date 01/26/20      PT LONG TERM GOAL #2   Title Patient will exhibit improved hip strength grossly >/= 4/5 and knee strength grossly >/= 4+/5 MMT to improve ability to stand and walk longer periods    Baseline Patient continues to demonstrate gross hip and LE weakness    Time 4    Period Weeks    Status On-going    Target Date 01/26/20      PT LONG TERM GOAL #3   Title Patient will improve active lumbar motion by >/=  25% and report </=2/10 pain to improve lifting ability    Baseline Patient demonstrates improved motion but continues to report increased pain    Time 4    Period Weeks    Status On-going    Target Date 01/26/20      PT LONG TERM GOAL #4   Title Patient will report pain with all standing and working tasks </= 3/10    Baseline Patient continues to report  4/10 pain level    Time 4    Period Weeks    Status On-going    Target Date 01/26/20                 Plan - 12/29/19 0847    Clinical Impression Statement Patient tolerated therapy well with no adverse effects. Patient reports inconsistency with HEP so patient educated on focus of therapy to have her independent with HEP to manage symptoms and improve mobility and strength. Today's visit focused on reviewing HEP to ensure proper performance and undertsanding. Patient does continue to exhibit limitation in her motion and impaired core/hip strength that are likely contributing to her intolerance for standing extended periods. She would benefit from continued skilled PT to ensure consistency and finalize HEP in order to improve strength and mobility so she can perform walking and working tasks with less pain and limitation.    PT Frequency Biweekly    PT Duration 4 weeks    PT Treatment/Interventions ADLs/Self Care Home Management;Cryotherapy;Electrical Stimulation;Moist Heat;Neuromuscular re-education;Balance training;Therapeutic exercise;Therapeutic activities;Functional mobility training;Stair training;Gait training;Patient/family education;Manual techniques;Dry needling;Passive range of motion;Joint Manipulations;Spinal Manipulations    PT Next Visit Plan Assess HEP and progress PRN, lumbar and hip stretching, progress core and hip strengthening, lifting mechanics    PT Home Exercise Plan [email or web address]: seated hamstring stretch, supine piriformis stretch, supine hip flexor stretch, child's pose stretch, bridge, SLR, sit<>stand, quadruped hip extension, abduction with yellow, self TPR using ball, child's pose stretch, standing lumbar stretching at work    [employer] and Agree with Plan of Care Patient           Patient will benefit from skilled therapeutic intervention in order to improve the following deficits and impairments:  Abnormal gait, Difficulty walking, Decreased endurance, Pain,  Decreased activity tolerance, Decreased strength, Postural dysfunction, Improper body mechanics, Impaired flexibility  Visit Diagnosis: Chronic bilateral low back pain, unspecified whether sciatica present  Pain in left hip  Chronic pain of right knee  Chronic pain of left knee  Muscle weakness (generalized)  Other symptoms and signs involving the musculoskeletal system     Problem List Patient Active Problem List   Diagnosis Date Noted  . Unilateral primary osteoarthritis, left knee 01/10/2018  . Chronic pain of left knee 01/10/2018  . DDD (degenerative disc disease), cervical 03/22/2016  . DDD (degenerative disc disease), lumbar 03/22/2016  . 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 Angioedema 07/15/2014  . Hypokalemia 07/15/2014  . Diabetes mellitus, controlled (HCC) 07/03/2012  . HTN (hypertension) 07/03/2012  . Rheumatoid arthritis (HCC) 07/03/2012  . Current chronic use of systemic steroids 07/03/2012  . 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

## 2019-12-29 NOTE — Patient Instructions (Signed)
Access Code: [email or web address] URL: [email or web address] Date: 12/29/2019 Prepared by: Rosana Hoes  Exercises Seated Hamstring Stretch - 2 x daily - 7 x weekly - 2 reps - 20 seconds hold Supine Piriformis Stretch with Foot on Ground - 2 x daily - 7 x weekly - 2 reps - 20 seconds hold Modified Thomas Stretch - 2 x daily - 7 x weekly - 2 reps - 20 seconds hold Supine Lower Trunk Rotation - 1 x daily - 7 x weekly - 5 reps - 10 seconds hold Supine Active Straight Leg Raise - 1 x daily - 7 x weekly - 2 sets - 10 reps Bridge - 1 x daily - 7 x weekly - 2 sets - 10 reps Hooklying Clamshell with Resistance - 1 x daily - 7 x weekly - 2 sets - 10 reps Sit to Stand - 1 x daily - 7 x weekly - 2 sets - 10 reps Quadruped on Forearms Hip Extension - 1 x daily - 7 x weekly - 3 sets - 10 reps Standing Quadratus Lumborum Mobilization with Small Ball on Wall Low Back Stretch with TRX - 1 x daily - 7 x weekly - 3 reps - 20 seconds hold Standing Quadratus Lumborum Stretch with Doorway - 1 x daily - 7 x weekly - 3 sets - 3 reps - 20 seconds hold

## 2020-01-08 ENCOUNTER — Other Ambulatory Visit: Payer: Self-pay | Admitting: Pharmacist

## 2020-01-08 NOTE — Telephone Encounter (Signed)
Last Visit: 11/20/2019 Next Visit: 04/19/2020  Okay to refill Zofran?

## 2020-01-08 NOTE — Telephone Encounter (Signed)
Refill request- routing to clinical staff

## 2020-01-12 ENCOUNTER — Encounter: Payer: Self-pay | Admitting: Physical Therapy

## 2020-01-12 ENCOUNTER — Other Ambulatory Visit: Payer: Self-pay

## 2020-01-12 ENCOUNTER — Ambulatory Visit: Payer: Medicare Other | Attending: Physician Assistant | Admitting: Physical Therapy

## 2020-01-12 DIAGNOSIS — M545 Low back pain, unspecified: Secondary | ICD-10-CM

## 2020-01-12 DIAGNOSIS — M25561 Pain in right knee: Secondary | ICD-10-CM | POA: Diagnosis present

## 2020-01-12 DIAGNOSIS — M25562 Pain in left knee: Secondary | ICD-10-CM | POA: Diagnosis present

## 2020-01-12 DIAGNOSIS — M6281 Muscle weakness (generalized): Secondary | ICD-10-CM

## 2020-01-12 DIAGNOSIS — M25552 Pain in left hip: Secondary | ICD-10-CM

## 2020-01-12 DIAGNOSIS — R29898 Other symptoms and signs involving the musculoskeletal system: Secondary | ICD-10-CM

## 2020-01-12 DIAGNOSIS — G8929 Other chronic pain: Secondary | ICD-10-CM | POA: Diagnosis present

## 2020-01-12 NOTE — Therapy (Signed)
[facility] Outpatient Rehabilitation [facility] [address] [place], [name], [address] Phone: [phone]   Fax:  [phone]  Physical Therapy Treatment  Patient Details  Name: Wendy Woods MRN: [id number] Date of Birth: 1960-07-10 Referring Provider (PT): Gearldine Bienenstock, PA-C   Encounter Date: 01/12/2020   PT End of Session - 01/12/20 0905    Visit Number 13    Number of Visits 14    Date for PT Re-Evaluation 01/26/20    Authorization Type [employer]IC[employer]    Progress Note Due on Visit 22    PT Start Time 0900    PT Stop Time 0940    PT Time Calculation (min) 40 min    Activity Tolerance Patient tolerated treatment well    Behavior During Therapy [facility] for tasks assessed/performed           Past Medical History:  Diagnosis Date  . DDD (degenerative disc disease), cervical 03/22/2016  . DDD (degenerative disc disease), lumbar 03/22/2016  . GERD (gastroesophageal reflux disease)   . Hyperlipidemia   . Hypertension   . Rheumatoid arthritis (HCC)   . Rheumatoid arthritis(714.0)   . 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 Type II diabetes mellitus (HCC)     Past Surgical History:  Procedure Laterality Date  . APPENDECTOMY  1968  . CATARACT EXTRACTION W/ INTRAOCULAR LENS  IMPLANT, BILATERAL Bilateral 11/2013-12/2013  . KNEE ARTHROPLASTY    . KNEE ARTHROSCOPY Left 1980's-1990's X 3  . MYOMECTOMY  1990's    There were no vitals filed for this visit.   Subjective Assessment - 01/12/20 0900    Subjective Patient reports she is doing well this visit with new issues.    Patient Stated Goals Relieve back pain    Currently in Pain? Yes    Pain Score 3     Pain Location Back    Pain Orientation Lower;Left    Pain Descriptors / Indicators Tightness    Pain Type Chronic pain    Pain Onset More than a month ago    Pain Frequency Intermittent    Aggravating Factors  Standing extended periods, lifting              [facility] PT Assessment - 01/12/20 0001       Strength   Right Hip Extension 4-/5    Right Hip ABduction 4-/5    Left Hip Extension 4-/5    Left Hip ABduction 4-/5                         [facility] Adult PT Treatment/Exercise - 01/12/20 0001      Lumbar Exercises: Stretches   Passive Hamstring Stretch 2 reps;20 seconds    Passive Hamstring Stretch Limitations seated edge of mat    Lower Trunk Rotation 5 reps;10 seconds    Hip Flexor Stretch 2 reps;30 seconds    Hip Flexor Stretch Limitations supine edge of table    Piriformis Stretch 2 reps;20 seconds    Piriformis Stretch Limitations supine    Other Lumbar Stretch Exercise Lumbar flexion stretch at sink 2 x 30 sec    Other Lumbar Stretch Exercise Standing QL stretch in doorfram 2 x 30 sec      Lumbar Exercises: Aerobic   Nustep L5 x 5 min (LE only)      Lumbar Exercises: Standing   Lifting Limitations Deadlift with 25# on 6" box 2x10      Lumbar Exercises: Seated   Sit to Stand 10  reps      Lumbar Exercises: Supine   Clam 10 reps;3 seconds   2 sets   Clam Limitations green band    Bridge 10 reps;3 seconds   2 sets   Straight Leg Raise 10 reps   2 sets     Lumbar Exercises: Quadruped   Opposite Arm/Leg Raise 10 reps;3 seconds    Opposite Arm/Leg Raise Limitations performed on forearms                  PT Education - 01/12/20 0905    Education Details HEP    Person(s) Educated Patient    Methods Explanation    Comprehension Verbalized understanding;Need further instruction            PT Short Term Goals - 12/29/19 0853      PT SHORT TERM GOAL #1   Title Patient will be I with initial HEP to progress with PT    Time 4    Period Weeks    Status Achieved    Target Date 10/20/19      PT SHORT TERM GOAL #2   Title Patient will report pain while standing at work </= 5/10 to improve activity tolerance    Baseline Patient reports 4/10 pain level this vist    Time 4    Period Weeks    Status Achieved    Target Date 12/08/19              PT Long Term Goals - 12/29/19 0854      PT LONG TERM GOAL #1   Title Patient will be I with final HEP to maintain progress from PT    Baseline Patient is not consistent with HEP    Time 4    Period Weeks    Status On-going    Target Date 01/26/20      PT LONG TERM GOAL #2   Title Patient will exhibit improved hip strength grossly >/= 4/5 and knee strength grossly >/= 4+/5 MMT to improve ability to stand and walk longer periods    Baseline Patient continues to demonstrate gross hip and LE weakness    Time 4    Period Weeks    Status On-going    Target Date 01/26/20      PT LONG TERM GOAL #3   Title Patient will improve active lumbar motion by >/= 25% and report </=2/10 pain to improve lifting ability    Baseline Patient demonstrates improved motion but continues to report increased pain    Time 4    Period Weeks    Status On-going    Target Date 01/26/20      PT LONG TERM GOAL #4   Title Patient will report pain with all standing and working tasks </= 3/10    Baseline Patient continues to report 4/10 pain level    Time 4    Period Weeks    Status On-going    Target Date 01/26/20                 Plan - 01/12/20 0906    Clinical Impression Statement Patient tolerated therapy well with no adverse effects. Continued working on mobility and strengthening with good tolerance. Patient did not report any increase pain with therapy. She continues to require occasional cueing for proper exercise technique and pacing. She would benefit from continued skilled PT to ensure consistency and finalize HEP in order to improve strength and mobility so she can perform walking  and working tasks with less pain and limitation.    PT Treatment/Interventions ADLs/Self Care Home Management;Cryotherapy;Electrical Stimulation;Moist Heat;Neuromuscular re-education;Balance training;Therapeutic exercise;Therapeutic activities;Functional mobility training;Stair training;Gait  training;Patient/family education;Manual techniques;Dry needling;Passive range of motion;Joint Manipulations;Spinal Manipulations    PT Next Visit Plan Assess HEP and progress PRN, lumbar and hip stretching, progress core and hip strengthening, lifting mechanics    PT Home Exercise Plan [email or web address]: seated hamstring stretch, supine piriformis stretch, supine hip flexor stretch, child's pose stretch, bridge, SLR, sit<>stand, quadruped hip extension, abduction with yellow, self TPR using ball, child's pose stretch, standing lumbar stretching at work    [employer] and Agree with Plan of Care Patient           Patient will benefit from skilled therapeutic intervention in order to improve the following deficits and impairments:  Abnormal gait, Difficulty walking, Decreased endurance, Pain, Decreased activity tolerance, Decreased strength, Postural dysfunction, Improper body mechanics, Impaired flexibility  Visit Diagnosis: Chronic bilateral low back pain, unspecified whether sciatica present  Pain in left hip  Chronic pain of right knee  Chronic pain of left knee  Muscle weakness (generalized)  Other symptoms and signs involving the musculoskeletal system     Problem List Patient Active Problem List   Diagnosis Date Noted  . Unilateral primary osteoarthritis, left knee 01/10/2018  . Chronic pain of left knee 01/10/2018  . DDD (degenerative disc disease), cervical 03/22/2016  . DDD (degenerative disc disease), lumbar 03/22/2016  . 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 Angioedema 07/15/2014  . Hypokalemia 07/15/2014  . Diabetes mellitus, controlled (HCC) 07/03/2012  . HTN (hypertension) 07/03/2012  . Rheumatoid arthritis (HCC) 07/03/2012  . Current chronic use of systemic steroids 07/03/2012  . 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 [place], [name], [address] Phone: [phone]   Fax:  [phone]  Name: Wendy Woods MRN: [id number] Date of Birth: 05/30/1960

## 2020-01-21 ENCOUNTER — Other Ambulatory Visit: Payer: Self-pay

## 2020-01-21 ENCOUNTER — Ambulatory Visit (HOSPITAL_COMMUNITY)
Admission: RE | Admit: 2020-01-21 | Discharge: 2020-01-21 | Disposition: A | Discharge status: Home / Self Care | Payer: Medicare Other | Source: Ambulatory Visit | Attending: Rheumatology | Admitting: Rheumatology

## 2020-01-21 DIAGNOSIS — M0579 Rheumatoid arthritis with rheumatoid factor of multiple sites without organ or systems involvement: Secondary | ICD-10-CM | POA: Insufficient documentation

## 2020-01-21 MED ORDER — ACETAMINOPHEN 325 MG PO TABS: 650.0000 mg | ORAL_TABLET | ORAL | Status: DC

## 2020-01-21 MED ORDER — DIPHENHYDRAMINE HCL 25 MG PO CAPS: 25.0000 mg | ORAL_CAPSULE | ORAL | Status: DC

## 2020-01-21 MED ORDER — SODIUM CHLORIDE 0.9 % IV SOLN
6.0000 mg/kg | INTRAVENOUS | Status: DC
  Administered 2020-01-21: 500 mg via INTRAVENOUS
  Filled 2020-01-21: qty 50

## 2020-01-22 ENCOUNTER — Other Ambulatory Visit: Payer: Self-pay | Admitting: Pharmacist

## 2020-01-22 DIAGNOSIS — M0579 Rheumatoid arthritis with rheumatoid factor of multiple sites without organ or systems involvement: Secondary | ICD-10-CM

## 2020-01-22 DIAGNOSIS — Z79899 Other long term (current) drug therapy: Secondary | ICD-10-CM

## 2020-01-22 NOTE — Progress Notes (Signed)
Next infusion scheduled for 02/25/20 and due for updated orders.  Last Visit: 11/20/19 Next Visit: 04/19/20 Labs: stable 12/17/19 TB Gold: neg 02/12/2019  Orders placed for Remicade 6 mg/kg every 5 weeks x 2 doses along with premedication of Tylenol and Benadryl.  CBC/CMP/ TB gold order placed to be drawn with next infusion.   Wendy Woods, PharmD, [place], CPP Clinical Specialty Pharmacist (Rheumatology and Pulmonology)  01/22/2020 11:36 AM

## 2020-01-26 ENCOUNTER — Ambulatory Visit: Payer: Medicare Other | Admitting: Physical Therapy

## 2020-01-26 ENCOUNTER — Encounter: Payer: Self-pay | Admitting: Physical Therapy

## 2020-01-26 ENCOUNTER — Other Ambulatory Visit: Payer: Self-pay

## 2020-01-26 DIAGNOSIS — M545 Low back pain, unspecified: Secondary | ICD-10-CM

## 2020-01-26 DIAGNOSIS — M25552 Pain in left hip: Secondary | ICD-10-CM

## 2020-01-26 DIAGNOSIS — G8929 Other chronic pain: Secondary | ICD-10-CM

## 2020-01-26 DIAGNOSIS — M25561 Pain in right knee: Secondary | ICD-10-CM

## 2020-01-26 DIAGNOSIS — M6281 Muscle weakness (generalized): Secondary | ICD-10-CM

## 2020-01-26 DIAGNOSIS — M25562 Pain in left knee: Secondary | ICD-10-CM

## 2020-01-26 DIAGNOSIS — R29898 Other symptoms and signs involving the musculoskeletal system: Secondary | ICD-10-CM

## 2020-01-26 NOTE — Patient Instructions (Signed)
Access Code: [email or web address] URL: [email or web address] Date: 01/26/2020 Prepared by: Rosana Hoes  Exercises Seated Hamstring Stretch - 2 x daily - 7 x weekly - 2 reps - 20 seconds hold Supine Piriformis Stretch with Foot on Ground - 2 x daily - 7 x weekly - 2 reps - 20 seconds hold Modified Thomas Stretch - 2 x daily - 7 x weekly - 2 reps - 20 seconds hold Supine Lower Trunk Rotation - 1 x daily - 7 x weekly - 5 reps - 10 seconds hold Supine Active Straight Leg Raise - 1 x daily - 7 x weekly - 2 sets - 10 reps Bridge - 1 x daily - 7 x weekly - 2 sets - 10 reps Hooklying Clamshell with Resistance - 1 x daily - 7 x weekly - 2 sets - 10 reps Sit to Stand - 1 x daily - 7 x weekly - 2 sets - 10 reps Quadruped on Forearms Hip Extension - 1 x daily - 7 x weekly - 3 sets - 10 reps Standing Quadratus Lumborum Mobilization with Small Ball on Wall Low Back Stretch with TRX - 1 x daily - 7 x weekly - 3 reps - 20 seconds hold Standing Quadratus Lumborum Stretch with Doorway - 1 x daily - 7 x weekly - 3 sets - 3 reps - 20 seconds hold

## 2020-01-26 NOTE — Therapy (Signed)
[place], [place], [address] Phone: [phone]   Fax:  [phone]  Physical Therapy Treatment / Discharge  Patient Details  Name: Wendy Woods MRN: [id number] Date of Birth: 08-15-1960 Referring Provider (PT): Ofilia Neas, PA-C   Encounter Date: 01/26/2020   PT End of Session - 01/26/20 0837    Visit Number 14    Number of Visits 14    Date for PT Re-Evaluation 01/26/20    Authorization Type [employer] MEDICARE    Progress Note Due on Visit 22    PT Start Time 0830    PT Stop Time 0910    PT Time Calculation (min) 40 min    Activity Tolerance Patient tolerated treatment well    Behavior During Therapy [facility] for tasks assessed/performed           Past Medical History:  Diagnosis Date  . DDD (degenerative disc disease), cervical 03/22/2016  . DDD (degenerative disc disease), lumbar 03/22/2016  . GERD (gastroesophageal reflux disease)   . Hyperlipidemia   . Hypertension   . Rheumatoid arthritis ([place])   . Rheumatoid arthritis(714.0)   . 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 Type II diabetes mellitus ([place])     Past Surgical History:  Procedure Laterality Date  . APPENDECTOMY  1968  . CATARACT EXTRACTION W/ INTRAOCULAR LENS  IMPLANT, BILATERAL Bilateral 11/2013-12/2013  . KNEE ARTHROPLASTY    . KNEE ARTHROSCOPY Left 1980's-1990's X 3  . MYOMECTOMY  1990's    There were no vitals filed for this visit.   Subjective Assessment - 01/26/20 0834    Subjective Patient reports she is doing well, a little stiff this morning.    Patient Stated Goals Relieve back pain    Currently in Pain? Yes    Pain Score 2     Pain Location Back    Pain Orientation Lower;Left    Pain Descriptors / Indicators Tightness    Pain Type Chronic pain    Pain Radiating Towards Patient reports sometimes pain will radiate to left hip    Pain Onset More than a month ago    Pain Frequency Intermittent    Aggravating Factors   Standing extended periods, lifting              [facility] PT Assessment - 01/26/20 0001      Assessment   Medical Diagnosis Low back pain, left hip pain, bilateral knee pain    Referring Provider (PT) Su Monks    Next MD Visit 02/04/2020      Precautions   Precautions None      Restrictions   Weight Bearing Restrictions No      Balance Screen   Has the patient fallen in the past 6 months No    Has the patient had a decrease in activity level because of a fear of falling?  No    Is the patient reluctant to leave their home because of a fear of falling?  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[employer], community activities      Cognition   Overall Cognitive Status Within Functional Limits for tasks assessed      Observation/Other Assessments   Observations Patient appears in no apparent distress    Focus on Therapeutic Outcomes (FOTO)  NA  Sensation   Light Touch Appears Intact      Coordination   Gross Motor Movements are Fluid and Coordinated Yes      AROM   Lumbar Flexion WFL    Lumbar Extension 50%    Lumbar - Right Side Bend WFL    Lumbar - Left Side Bend WFL    Lumbar - Right Rotation WFL    Lumbar - Left Rotation [facility]      Strength   Right Hip Extension 4/5    Right Hip ABduction 4/5    Left Hip Extension 4-/5    Left Hip ABduction 4-/5    Right Knee Flexion 4+/5    Right Knee Extension 5/5    Left Knee Flexion 4+/5    Left Knee Extension 5/5      Transfers   Transfers Independent with all Transfers                         [facility] Adult PT Treatment/Exercise - 01/26/20 0001      Exercises   Exercises Lumbar      Lumbar Exercises: Stretches   Passive Hamstring Stretch 2 reps;20 seconds    Passive Hamstring Stretch Limitations seated edge of mat    Lower Trunk Rotation 5 reps;10 seconds    Hip Flexor  Stretch 2 reps;20 seconds    Hip Flexor Stretch Limitations supine edge of table    Piriformis Stretch 2 reps;20 seconds    Piriformis Stretch Limitations supine    Other Lumbar Stretch Exercise Lumbar flexion stretch at sink 2 x 20 sec    Other Lumbar Stretch Exercise Standing QL stretch in doorfram 2 x 20 sec      Lumbar Exercises: Aerobic   Nustep L5 x 4 min (LE only)      Lumbar Exercises: Seated   Sit to Stand 10 reps      Lumbar Exercises: Supine   Clam 10 reps;3 seconds    Clam Limitations green band    Bridge 10 reps;3 seconds    Straight Leg Raise 10 reps      Lumbar Exercises: Quadruped   Straight Leg Raise 10 reps;3 seconds    Straight Leg Raises Limitations on forearms     Opposite Arm/Leg Raise 10 reps;3 seconds    Opposite Arm/Leg Raise Limitations performed on forearms                  PT Education - 01/26/20 0836    Education Details POC, HEP final and consistency    Person(s) Educated Patient    Methods Explanation    Comprehension Verbalized understanding            PT Short Term Goals - 12/29/19 0853      PT SHORT TERM GOAL #1   Title Patient will be I with initial HEP to progress with PT    Time 4    Period Weeks    Status Achieved    Target Date 10/20/19      PT SHORT TERM GOAL #2   Title Patient will report pain while standing at work </= 5/10 to improve activity tolerance    Baseline Patient reports 4/10 pain level this vist    Time 4    Period Weeks    Status Achieved    Target Date 12/08/19             PT Long Term Goals - 01/26/20 8786  PT LONG TERM GOAL #1   Title Patient will be I with final HEP to maintain progress from PT    Baseline Patient reports improved consistentency with HEP    Time 4    Period Weeks    Status Achieved      PT LONG TERM GOAL #2   Title Patient will exhibit improved hip strength grossly >/= 4/5 and knee strength grossly >/= 4+/5 MMT to improve ability to stand and walk longer periods     Baseline Patient continues to demonstrate gross hip and LE weakness    Time 4    Period Weeks    Status Partially Met      PT LONG TERM GOAL #3   Title Patient will improve active lumbar motion by >/= 25% and report </=2/10 pain to improve lifting ability    Baseline Patient demonstrates improved motion but continues to report increased pain    Time 4    Period Weeks    Status Partially Met      PT LONG TERM GOAL #4   Title Patient will report pain with all standing and working tasks </= 3/10    Baseline Patient continues to report 3/10 pain level    Time 4    Period Weeks    Status Achieved                 Plan - 01/26/20 0837    Clinical Impression Statement Patient tolerated therapy well with no adverse effects. She is independent with her HEP for stretching and strengthening. She does not report any increase in pain with her exercises but does note continued low back tightness with standing extended periods at work. At this point the patient patient has met or partially met all established goals and is ready for discharge to independent HEP to continue working on her flexibility and strength.    PT Next Visit Plan NA - discharge    PT Home Exercise Plan [id number]: seated hamstring stretch, supine piriformis stretch, supine hip flexor stretch, child's pose stretch, bridge, SLR, sit<>stand, quadruped hip extension, abduction with yellow, self TPR using ball, child's pose stretch, standing lumbar stretching at work    Oncologist with Plan of Care Patient               Visit Diagnosis: Chronic bilateral low back pain, unspecified whether sciatica present  Pain in left hip  Chronic pain of right knee  Chronic pain of left knee  Muscle weakness (generalized)  Other symptoms and signs involving the musculoskeletal system     Problem List Patient Active Problem List   Diagnosis Date Noted  . Unilateral primary osteoarthritis, left knee 01/10/2018  .  Chronic pain of left knee 01/10/2018  . DDD (degenerative disc disease), cervical 03/22/2016  . DDD (degenerative disc disease), lumbar 03/22/2016  . 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 Angioedema 07/15/2014  . Hypokalemia 07/15/2014  . Diabetes mellitus, controlled ([place]) 07/03/2012  . HTN (hypertension) 07/03/2012  . Rheumatoid arthritis ([place]) 07/03/2012  . Current chronic use of systemic steroids 07/03/2012  . 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  Patient goals were partially met. Patient is being discharged due to being pleased with the current functional level.  ?????  Hilda Blades, PT, DPT, LAT, ATC 01/26/20  9:10 AM Phone: [phone] Fax: [facility] [address] [place], [place], [address] Phone: [phone]   Fax:  [phone]  Name: Wendy Woods MRN: [id number] Date of Birth: [birth year]

## 2020-02-04 ENCOUNTER — Ambulatory Visit: Payer: Medicare Other | Admitting: Physician Assistant

## 2020-02-25 ENCOUNTER — Ambulatory Visit (HOSPITAL_COMMUNITY): Payer: Medicare Other

## 2020-03-03 ENCOUNTER — Ambulatory Visit (HOSPITAL_COMMUNITY)
Admission: RE | Admit: 2020-03-03 | Discharge: 2020-03-03 | Disposition: A | Discharge status: Home / Self Care | Payer: Medicare Other | Source: Ambulatory Visit | Attending: Rheumatology | Admitting: Rheumatology

## 2020-03-03 ENCOUNTER — Other Ambulatory Visit: Payer: Self-pay

## 2020-03-03 DIAGNOSIS — M0579 Rheumatoid arthritis with rheumatoid factor of multiple sites without organ or systems involvement: Secondary | ICD-10-CM | POA: Insufficient documentation

## 2020-03-03 DIAGNOSIS — Z79899 Other long term (current) drug therapy: Secondary | ICD-10-CM | POA: Insufficient documentation

## 2020-03-03 LAB — COMPREHENSIVE METABOLIC PANEL
ALT: 16 U/L (ref 0–44)
AST: 23 U/L (ref 15–41)
Albumin: 3.8 g/dL (ref 3.5–5.0)
Alkaline Phosphatase: 51 U/L (ref 38–126)
Anion gap: 8 (ref 5–15)
BUN: 16 mg/dL (ref 6–20)
CO2: 28 mmol/L (ref 22–32)
Calcium: 9.7 mg/dL (ref 8.9–10.3)
Chloride: 102 mmol/L (ref 98–111)
Creatinine, Ser: 0.87 mg/dL (ref 0.44–1.00)
GFR, Estimated: >60 mL/min (ref >60)
Glucose, Bld: 156 mg/dL — ABNORMAL HIGH (ref 70–99)
Potassium: 3.9 mmol/L (ref 3.5–5.1)
Sodium: 138 mmol/L (ref 135–145)
Total Bilirubin: 0.4 mg/dL (ref 0.3–1.2)
Total Protein: 8.1 g/dL (ref 6.5–8.1)

## 2020-03-03 LAB — CBC WITH DIFFERENTIAL/PLATELET
Abs Immature Granulocytes: 0.02 10*3/uL (ref 0.00–0.07)
Basophils Absolute: 0.1 10*3/uL (ref 0.0–0.1)
Basophils Relative: 1 %
Eosinophils Absolute: 0.4 10*3/uL (ref 0.0–0.5)
Eosinophils Relative: 6 %
HCT: 36 % (ref 36.0–46.0)
Hemoglobin: 11.7 g/dL — ABNORMAL LOW (ref 12.0–15.0)
Immature Granulocytes: 0 %
Lymphocytes Relative: 44 %
Lymphs Abs: 3 10*3/uL (ref 0.7–4.0)
MCH: 29.3 pg (ref 26.0–34.0)
MCHC: 32.5 g/dL (ref 30.0–36.0)
MCV: 90 fL (ref 80.0–100.0)
Monocytes Absolute: 0.4 10*3/uL (ref 0.1–1.0)
Monocytes Relative: 7 %
Neutro Abs: 2.9 10*3/uL (ref 1.7–7.7)
Neutrophils Relative %: 42 %
Platelets: 419 10*3/uL — ABNORMAL HIGH (ref 150–400)
RBC: 4 MIL/uL (ref 3.87–5.11)
RDW: 13.1 % (ref 11.5–15.5)
WBC: 6.8 10*3/uL (ref 4.0–10.5)
nRBC: 0 % (ref 0.0–0.2)

## 2020-03-03 MED ORDER — DIPHENHYDRAMINE HCL 25 MG PO CAPS: 25.0000 mg | ORAL_CAPSULE | Freq: Once | ORAL | Status: DC

## 2020-03-03 MED ORDER — ACETAMINOPHEN 325 MG PO TABS: 650.0000 mg | ORAL_TABLET | Freq: Once | ORAL | Status: DC

## 2020-03-03 MED ORDER — SODIUM CHLORIDE 0.9 % IV SOLN
5.0000 mg/kg | INTRAVENOUS | Status: DC
  Administered 2020-03-03: 400 mg via INTRAVENOUS
  Filled 2020-03-03: qty 40

## 2020-03-03 NOTE — Progress Notes (Signed)
Glucose is elevated.  Anemia persists.  Overall labs are stable.

## 2020-03-09 LAB — QUANTIFERON-TB GOLD PLUS: QuantiFERON-TB Gold Plus: NEGATIVE

## 2020-03-09 LAB — QUANTIFERON-TB GOLD PLUS (RQFGPL)
QuantiFERON Mitogen Value: >10 IU/mL
QuantiFERON Nil Value: 0 IU/mL
QuantiFERON TB1 Ag Value: 0.01 IU/mL
QuantiFERON TB2 Ag Value: 0.06 IU/mL

## 2020-04-05 NOTE — Progress Notes (Signed)
Office Visit Note  Patient: Wendy Woods             Date of Birth: 25-Jun-1960           MRN: [id number]             PCP: Fleet Contras, MD Referring: Fleet Contras, MD Visit Date: 04/19/2020 Occupation: @GUAROCC @  Subjective:  Medication monitoring   History of Present Illness: Aloma Boch is a 60 y.o. female with history of seropositive rheumatoid arthritis, osteoarthritis, scleritis of both eyes, and DDD.  She is on remicade 5 mg/kg every 5 weeks. Her last infusion was on 04/12/20. She denies any recent rheumatoid arthritis or scleritis flares.  She states she is having some increased tenderness and stiffness in both hands and both knee joints today, which she attributes to cooler weather temperatures.  She notices intermittent swelling in both hands and both knees.  She continues to have chronic lower back pain and stiffness, which has been exacerbated by weight gain.  She plans on restarting water therapy.     Activities of Daily Living:  Patient reports morning stiffness for several hours.   Patient Denies nocturnal pain.  Difficulty dressing/grooming: Denies Difficulty climbing stairs: Denies Difficulty getting out of chair: Denies Difficulty using hands for taps, buttons, cutlery, and/or writing: Reports  Review of Systems  Constitutional: Positive for fatigue.  HENT: Positive for mouth dryness. Negative for mouth sores and nose dryness.   Eyes: Positive for pain, itching and dryness.  Respiratory: Negative for shortness of breath and difficulty breathing.   Cardiovascular: Negative for chest pain and palpitations.  Gastrointestinal: Negative for blood in stool, constipation and diarrhea.  Endocrine: Negative for increased urination.  Genitourinary: Negative for difficulty urinating.  Musculoskeletal: Positive for arthralgias, joint pain, joint swelling, myalgias, morning stiffness, muscle tenderness and myalgias.  Skin: Negative for color change, rash and redness.   Allergic/Immunologic: Negative for susceptible to infections.  Neurological: Positive for headaches. Negative for dizziness, numbness, memory loss and weakness.  Hematological: Negative for bruising/bleeding tendency.  Psychiatric/Behavioral: Negative for confusion.    PMFS History:  Patient Active Problem List   Diagnosis Date Noted  . Unilateral primary osteoarthritis, left knee 01/10/2018  . Chronic pain of left knee 01/10/2018  . DDD (degenerative disc disease), cervical 03/22/2016  . DDD (degenerative disc disease), lumbar 03/22/2016  . 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 Angioedema 07/15/2014  . Hypokalemia 07/15/2014  . Diabetes mellitus, controlled (HCC) 07/03/2012  . HTN (hypertension) 07/03/2012  . Rheumatoid arthritis (HCC) 07/03/2012  . Current chronic use of systemic steroids 07/03/2012  . 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 DDD (degenerative disc disease), cervical 03/22/2016  . DDD (degenerative disc disease), lumbar 03/22/2016  . GERD (gastroesophageal reflux disease)   . Hyperlipidemia   . Hypertension   . Rheumatoid arthritis (H[facility])   . Rheumatoid arthritis(714.0)   . 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 Type II diabetes mellitus (HCC)     Family History  Problem Relation Age of Onset  . Colon polyps Father   . Prostate cancer Father   . Breast cancer Cousin 26  . Colon cancer Neg Hx    Past Surgical History:  Procedure Laterality Date  . APPENDECTOMY  1968  . CATARACT EXTRACTION W/ INTRAOCULAR LENS  IMPLANT, BILATERAL Bilateral 11/2013-12/2013  . KNEE ARTHROPLASTY    . KNEE ARTHROSCOPY Left 1980's-1990's X 3  . MYOMECTOMY  1990's   Social History   Social History  Narrative  . Not on file   Immunization History  Administered Date(s) Administered  . PFIZER SARS-COV-2 Vaccination 04/23/2019, 05/12/2019, 01/28/2020     Objective: Vital Signs: BP (!) 148/80 (BP Location: Left Arm, Patient Position: Sitting, Cuff Size:  Normal)   Pulse 64   Resp 14   Ht 5' 4.5" (1.638 m)   Wt 184 lb (83.5 kg)   LMP 07/09/2014   BMI 31.10 kg/m    Physical Exam Vitals and nursing note reviewed.  Constitutional:      Appearance: She is well-developed and well-nourished.  HENT:     Head: Normocephalic and atraumatic.  Eyes:     Extraocular Movements: EOM normal.     Conjunctiva/sclera: Conjunctivae normal.  Cardiovascular:     Pulses: Intact distal pulses.  Pulmonary:     Effort: Pulmonary effort is normal.  Abdominal:     Palpations: Abdomen is soft.  Musculoskeletal:     Cervical back: Normal range of motion.  Skin:    General: Skin is warm and dry.     Capillary Refill: Capillary refill takes less than 2 seconds.  Neurological:     Mental Status: She is alert and oriented to person, place, and time.  Psychiatric:        Mood and Affect: Mood and affect normal.        Behavior: Behavior normal.      Musculoskeletal Exam: C-spine good ROM with no discomfort.  Trapezius muscle tension and tenderness bilaterally.  Thoracic and lumbar spine good ROM.  Tenderness over the left SI joint and paraspinal muscles in the lumbar region.  Shoulder joints, elbow joints, wrist joints, MCPs, PIPs, and DIPs good ROM with no synovitis.  Tenderness over the right 2nd and 3rd MCPs.  Tenderness over the ulnar aspect of the left wrist.  Hip joints good ROM with no discomfort.  Knee joints good ROM with no warmth or effusion.  Ankle joints good ROM with no tenderness or inflammation.  Mild pedal edema noted.   CDAI Exam: CDAI Score: 3.8  Patient Global: 5 mm; Provider Global: 3 mm Swollen: 0 ; Tender: 4  Joint Exam 04/19/2020      Right  Left  Wrist      Tender  MCP 2   Tender     MCP 3   Tender     Sacroiliac      Tender     Investigation: No additional findings.  Imaging: No results found.  Recent Labs: Lab Results  Component Value Date   WBC 6.8 03/03/2020   HGB 11.7 (L) 03/03/2020   PLT 419 (H) 03/03/2020    NA 138 03/03/2020   K 3.9 03/03/2020   CL 102 03/03/2020   CO2 28 03/03/2020   GLUCOSE 156 (H) 03/03/2020   BUN 16 03/03/2020   CREATININE 0.87 03/03/2020   BILITOT 0.4 03/03/2020   ALKPHOS 51 03/03/2020   AST 23 03/03/2020   ALT 16 03/03/2020   PROT 8.1 03/03/2020   ALBUMIN 3.8 03/03/2020   CALCIUM 9.7 03/03/2020   GFRAA >60 12/17/2019   QFTBGOLD Negative 09/18/2016   QFTBGOLDPLUS Negative 03/03/2020    Speciality Comments: Remicade 6mg / kg every 5 weeks-ACY 12/25/19  Procedures:  No procedures performed Allergies: Latex, Lisinopril, Penicillins, and Methotrexate derivatives   Assessment / Plan:     Visit Diagnoses: Rheumatoid arthritis involving multiple sites with positive rheumatoid factor (HCC) -  + RF, Anti-CCP +, ANA -: She has no synovitis on examination today. She has  not had any recent flares. She continues to experience intermittent pain and inflammation in both hands and both knee joints, which is typically exacerbated by cooler weather temperatures or overuse activities.  She has tenderness palpation over the right second and third MCPs and the ulnar aspect of the left wrist on exam today.  Overall she is clinically doing well on Remicade 5 mg/kg IV infusions every 5 weeks.  Her most recent infusion was on 04/12/2020.  She will continue on the current treatment regimen.  She was advised to notify us if she develops increased joint pain or joint swelling.  She will follow-up in the office in 5 months.  High risk medication use - Remicade IV infusion 5 mg/kg every 5 weeks.  Discontinue methotrexate due to GI side effects. TB gold negative on 03/03/20 and will continue to monitor yearly. CBC and CMP updated on 03/03/20.  She will be due to update lab work in February and every 3 months.  She has not had any recent infections.  She was advised to hold remicade if she develops signs or symptoms of an infection and to resume once the infection has cleared.  She has received all 3  COVID-19 vaccinations.  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  She is not experiencing any eye pain, conjunctival injection, or photophobia.  Primary osteoarthritis of both hands: She has PIP and DIP thickening consistent with osteoarthritis of both hands.  No tenderness or inflammation was noted.  She is able to make a complete fist bilaterally.  Joint protection and muscle strengthening were discussed.  Primary osteoarthritis of both knees: She has good range of motion in both knee joints on exam.  No warmth or effusion was noted.  Dicussed the importance of regular exercise.  She plans on returning to water therapy.    Trochanteric bursitis, left hip: Mild tenderness palpation on examination today.  Encouraged to perform stretching exercises on a daily basis.  Ganglion cyst of dorsum of left wrist: Resolved.  DDD (degenerative disc disease), cervical: She has good range of motion with no discomfort.  She has trapezius muscle tension and muscle tenderness bilaterally.  We discussed the use of a heating pad as well as performing stretching exercises.  DDD (degenerative disc disease), lumbar: She continues to experience intermittent lower back pain and stiffness.  She has no symptoms of radiculopathy.  She has some tenderness palpation over the left SI joint and paraspinal muscles in the lumbar region.  She plans on trying to work on weight loss as well as back stretching and strengthening exercises.  She noticed significant improvement in her discomfort after going to physical therapy.  Other medical conditions are listed as follows:   Essential hypertension  History of diabetes mellitus, type II  History of vitamin D deficiency  Orders: No orders of the defined types were placed in this encounter.  No orders of the defined types were placed in this encounter.   .  Follow-Up Instructions: Return in about 5 months (around 09/17/2020) for Rheumatoid arthritis,  Osteoarthritis, DDD.   Gearldine Bienenstock, PA-C  Note - This record has been created using Dragon software.  Chart creation errors have been sought, but may not always  have been located. Such creation errors do not reflect on  the standard of medical care.

## 2020-04-07 ENCOUNTER — Encounter (HOSPITAL_COMMUNITY): Payer: Medicare Other

## 2020-04-09 ENCOUNTER — Telehealth: Payer: Self-pay | Admitting: Pharmacy Technician

## 2020-04-09 NOTE — Telephone Encounter (Signed)
Medicare and Supplement: Covers deductible  Medicare covers 80% of the infusion and no authorization is required, and the patient's supplement would cover the 20% of the cost that was not paid for by Medicare as long as Medicare covered the medication. The Supplement also covers the patient's Medicare deductible

## 2020-04-12 ENCOUNTER — Ambulatory Visit (HOSPITAL_COMMUNITY)
Admission: RE | Admit: 2020-04-12 | Discharge: 2020-04-12 | Disposition: A | Discharge status: Home / Self Care | Payer: Medicare Other | Source: Ambulatory Visit | Attending: Rheumatology | Admitting: Rheumatology

## 2020-04-12 ENCOUNTER — Other Ambulatory Visit: Payer: Self-pay

## 2020-04-12 DIAGNOSIS — M0579 Rheumatoid arthritis with rheumatoid factor of multiple sites without organ or systems involvement: Secondary | ICD-10-CM | POA: Diagnosis not present

## 2020-04-12 MED ORDER — ACETAMINOPHEN 325 MG PO TABS: 650.0000 mg | ORAL_TABLET | Freq: Once | ORAL | Status: DC

## 2020-04-12 MED ORDER — SODIUM CHLORIDE 0.9 % IV SOLN
5.0000 mg/kg | INTRAVENOUS | Status: DC
  Administered 2020-04-12: 400 mg via INTRAVENOUS
  Filled 2020-04-12: qty 40

## 2020-04-12 MED ORDER — DIPHENHYDRAMINE HCL 25 MG PO CAPS: 25.0000 mg | ORAL_CAPSULE | Freq: Once | ORAL | Status: DC

## 2020-04-19 ENCOUNTER — Ambulatory Visit (INDEPENDENT_AMBULATORY_CARE_PROVIDER_SITE_OTHER): Payer: Medicare Other | Admitting: Physician Assistant

## 2020-04-19 ENCOUNTER — Encounter: Payer: Self-pay | Admitting: Physician Assistant

## 2020-04-19 ENCOUNTER — Other Ambulatory Visit: Payer: Self-pay

## 2020-04-19 VITALS — BP 148/80 | HR 64 | Resp 14 | Ht 64.5 in | Wt 184.0 lb

## 2020-04-19 DIAGNOSIS — Z8639 Personal history of other endocrine, nutritional and metabolic disease: Secondary | ICD-10-CM

## 2020-04-19 DIAGNOSIS — M19042 Primary osteoarthritis, left hand: Secondary | ICD-10-CM

## 2020-04-19 DIAGNOSIS — Z79899 Other long term (current) drug therapy: Secondary | ICD-10-CM | POA: Diagnosis not present

## 2020-04-19 DIAGNOSIS — I10 Essential (primary) hypertension: Secondary | ICD-10-CM

## 2020-04-19 DIAGNOSIS — M19041 Primary osteoarthritis, right hand: Secondary | ICD-10-CM | POA: Diagnosis not present

## 2020-04-19 DIAGNOSIS — H15003 Unspecified scleritis, bilateral: Secondary | ICD-10-CM | POA: Diagnosis not present

## 2020-04-19 DIAGNOSIS — M5136 Other intervertebral disc degeneration, lumbar region: Secondary | ICD-10-CM

## 2020-04-19 DIAGNOSIS — M7062 Trochanteric bursitis, left hip: Secondary | ICD-10-CM

## 2020-04-19 DIAGNOSIS — M170 Bilateral primary osteoarthritis of knee: Secondary | ICD-10-CM

## 2020-04-19 DIAGNOSIS — M0579 Rheumatoid arthritis with rheumatoid factor of multiple sites without organ or systems involvement: Secondary | ICD-10-CM | POA: Diagnosis not present

## 2020-04-19 DIAGNOSIS — M67432 Ganglion, left wrist: Secondary | ICD-10-CM

## 2020-04-19 DIAGNOSIS — M5030 Other cervical disc degeneration, unspecified cervical region: Secondary | ICD-10-CM

## 2020-05-17 ENCOUNTER — Other Ambulatory Visit: Payer: Self-pay | Admitting: Pharmacist

## 2020-05-17 DIAGNOSIS — M0579 Rheumatoid arthritis with rheumatoid factor of multiple sites without organ or systems involvement: Secondary | ICD-10-CM

## 2020-05-17 DIAGNOSIS — Z79899 Other long term (current) drug therapy: Secondary | ICD-10-CM

## 2020-05-17 NOTE — Progress Notes (Signed)
Next infusion scheduled for 05/19/20 and due for updated Remicade orders.  Dose: Remicade 5 mg/kg every 5 weeks  Last Visit: 04/19/20 Next Visit: 09/06/20 Labs: 03/03/20 - Glucose elevated, anemia persists but for past 7 months TB Gold: 03/03/20 - negative.   Next due 03/03/21.  Orders placed for Remicade 5 mg/kg (calculated to be 400mg  based on recorded BW 83.5 kg on 04/19/20) every 5 weeks x 2 doses along with premedication of Tylenol and Benadryl with each infusion.  CBC/CMP order placed to be drawn with next infusion.  04/21/20, PharmD, MPH Clinical Pharmacist (Rheumatology and Pulmonology)  05/17/2020 12:34 PM

## 2020-05-19 ENCOUNTER — Ambulatory Visit (HOSPITAL_COMMUNITY): Payer: Medicare Other

## 2020-05-26 ENCOUNTER — Ambulatory Visit (HOSPITAL_COMMUNITY)
Admission: RE | Admit: 2020-05-26 | Discharge: 2020-05-26 | Disposition: A | Discharge status: Home / Self Care | Payer: Medicare Other | Source: Ambulatory Visit | Attending: Rheumatology | Admitting: Rheumatology

## 2020-05-26 ENCOUNTER — Other Ambulatory Visit: Payer: Self-pay

## 2020-05-26 DIAGNOSIS — M0579 Rheumatoid arthritis with rheumatoid factor of multiple sites without organ or systems involvement: Secondary | ICD-10-CM | POA: Diagnosis not present

## 2020-05-26 DIAGNOSIS — Z79899 Other long term (current) drug therapy: Secondary | ICD-10-CM | POA: Diagnosis present

## 2020-05-26 LAB — CBC WITH DIFFERENTIAL/PLATELET
Abs Immature Granulocytes: 0.01 10*3/uL (ref 0.00–0.07)
Basophils Absolute: 0.1 10*3/uL (ref 0.0–0.1)
Basophils Relative: 1 %
Eosinophils Absolute: 0.4 10*3/uL (ref 0.0–0.5)
Eosinophils Relative: 6 %
HCT: 36.2 % (ref 36.0–46.0)
Hemoglobin: 11.6 g/dL — ABNORMAL LOW (ref 12.0–15.0)
Immature Granulocytes: 0 %
Lymphocytes Relative: 40 %
Lymphs Abs: 2.5 10*3/uL (ref 0.7–4.0)
MCH: 29.2 pg (ref 26.0–34.0)
MCHC: 32 g/dL (ref 30.0–36.0)
MCV: 91.2 fL (ref 80.0–100.0)
Monocytes Absolute: 0.4 10*3/uL (ref 0.1–1.0)
Monocytes Relative: 7 %
Neutro Abs: 2.9 10*3/uL (ref 1.7–7.7)
Neutrophils Relative %: 46 %
Platelets: 371 10*3/uL (ref 150–400)
RBC: 3.97 MIL/uL (ref 3.87–5.11)
RDW: 13.2 % (ref 11.5–15.5)
WBC: 6.3 10*3/uL (ref 4.0–10.5)
nRBC: 0 % (ref 0.0–0.2)

## 2020-05-26 LAB — COMPREHENSIVE METABOLIC PANEL
ALT: 14 U/L (ref 0–44)
AST: 18 U/L (ref 15–41)
Albumin: 3.5 g/dL (ref 3.5–5.0)
Alkaline Phosphatase: 48 U/L (ref 38–126)
Anion gap: 10 (ref 5–15)
BUN: 16 mg/dL (ref 6–20)
CO2: 28 mmol/L (ref 22–32)
Calcium: 9.4 mg/dL (ref 8.9–10.3)
Chloride: 100 mmol/L (ref 98–111)
Creatinine, Ser: 0.92 mg/dL (ref 0.44–1.00)
GFR, Estimated: >60 mL/min (ref >60)
Glucose, Bld: 140 mg/dL — ABNORMAL HIGH (ref 70–99)
Potassium: 3.7 mmol/L (ref 3.5–5.1)
Sodium: 138 mmol/L (ref 135–145)
Total Bilirubin: 0.8 mg/dL (ref 0.3–1.2)
Total Protein: 7.5 g/dL (ref 6.5–8.1)

## 2020-05-26 MED ORDER — ACETAMINOPHEN 325 MG PO TABS: 650.0000 mg | ORAL_TABLET | ORAL | Status: DC

## 2020-05-26 MED ORDER — SODIUM CHLORIDE 0.9 % IV SOLN
5.0000 mg/kg | INTRAVENOUS | Status: DC
  Administered 2020-05-26: 400 mg via INTRAVENOUS
  Filled 2020-05-26: qty 40

## 2020-05-26 MED ORDER — DIPHENHYDRAMINE HCL 25 MG PO CAPS: 25.0000 mg | ORAL_CAPSULE | ORAL | Status: DC

## 2020-05-26 NOTE — Progress Notes (Signed)
Glucose is elevated-140. Rest of CMP WNL.  Hemoglobin is borderline low-11.6. rest of CBC WNL.

## 2020-06-30 ENCOUNTER — Telehealth: Payer: Self-pay | Admitting: Rheumatology

## 2020-06-30 ENCOUNTER — Encounter (HOSPITAL_COMMUNITY): Payer: Medicare Other

## 2020-06-30 NOTE — Telephone Encounter (Signed)
Patient calling to find out if there is a program of any kind to help her get a modified shower? Patient having trouble stepping over bathtub to get in, and out for her showers. Please call to advise.

## 2020-06-30 NOTE — Telephone Encounter (Signed)
Please have patient contact her PCP.  She may need to have home health PT/OT evaluation.

## 2020-06-30 NOTE — Telephone Encounter (Signed)
Attempted to contact the patient and left message to advise patient to contact her PCP.  She may need to have home health PT/OT evaluation.

## 2020-07-07 ENCOUNTER — Ambulatory Visit (HOSPITAL_COMMUNITY)
Admission: RE | Admit: 2020-07-07 | Discharge: 2020-07-07 | Disposition: A | Discharge status: Home / Self Care | Payer: Medicare Other | Source: Ambulatory Visit | Attending: Rheumatology | Admitting: Rheumatology

## 2020-07-07 ENCOUNTER — Other Ambulatory Visit: Payer: Self-pay

## 2020-07-07 DIAGNOSIS — M0579 Rheumatoid arthritis with rheumatoid factor of multiple sites without organ or systems involvement: Secondary | ICD-10-CM | POA: Diagnosis not present

## 2020-07-07 MED ORDER — ACETAMINOPHEN 325 MG PO TABS: 650.0000 mg | ORAL_TABLET | ORAL | Status: DC

## 2020-07-07 MED ORDER — SODIUM CHLORIDE 0.9 % IV SOLN
5.0000 mg/kg | INTRAVENOUS | Status: DC
  Administered 2020-07-07: 400 mg via INTRAVENOUS
  Filled 2020-07-07: qty 40

## 2020-07-07 MED ORDER — DIPHENHYDRAMINE HCL 25 MG PO CAPS: 25.0000 mg | ORAL_CAPSULE | ORAL | Status: DC

## 2020-07-23 ENCOUNTER — Other Ambulatory Visit: Payer: Self-pay | Admitting: Pharmacist

## 2020-07-23 DIAGNOSIS — Z79899 Other long term (current) drug therapy: Secondary | ICD-10-CM

## 2020-07-23 DIAGNOSIS — M0579 Rheumatoid arthritis with rheumatoid factor of multiple sites without organ or systems involvement: Secondary | ICD-10-CM

## 2020-07-23 NOTE — Progress Notes (Signed)
Next infusion scheduled for Remicade on 08/11/20 and due for updated orders. Diagnosis:  Dose: 5 mg/kg every 5 weeks - 400mg  based on last recorded weight of 83kg  Last Clinic Visit: 04/19/20 Next Clinic Visit: 09/06/20  Last infusion: 07/07/20 Labs: 05/26/20 - glucose elevated but rest of CMP wnl - Hgb low, but rest of CBC wnl. Patient notified to continue iron supplement TB Gold: negative on 03/03/20  Orders placed for Remicade 5mg /kg x 2 doses along with premedication of Tylenol and Benadryl to be administered 30 minutes before medication infusion. Standing CBC with diff/platelet and CMP with GFR orders placed to be drawn every 3 months.  Next TB gold due November 2022  Called patient and provided with phone number for [facility] [phone])  [facility], PharmD, MPH Clinical Pharmacist (Rheumatology and Pulmonology)

## 2020-08-11 ENCOUNTER — Inpatient Hospital Stay (HOSPITAL_COMMUNITY): Admission: RE | Admit: 2020-08-11 | Payer: Medicare Other | Source: Ambulatory Visit

## 2020-08-23 NOTE — Progress Notes (Signed)
Office Visit Note  Patient: Wendy Woods             Date of Birth: 06/27/1960           MRN: [id number]             PCP: Fleet Contras, MD Referring: Fleet Contras, MD Visit Date: 09/06/2020 Occupation: @  Subjective:  Medication management   History of Present Illness: Diella Gillingham is a 60 y.o. female with a history of rheumatoid arthritis, osteoarthritis and scleritis.  She states she had some floaters in her left eye for which she saw her ophthalmologist.  She was told that she was not having a flare of scleritis.  She states her joint pain is manageable and she has not had a flare of rheumatoid arthritis in a while.  She continues to have some discomfort in her hands, knees, upper back and lower back due to underlying osteoarthritis.  The trochanteric bursitis has resolved.  Activities of Daily Living:  Patient reports morning stiffness for 15  minutes.   Patient Denies nocturnal pain.  Difficulty dressing/grooming: Denies Difficulty climbing stairs: Denies Difficulty getting out of chair: Denies Difficulty using hands for taps, buttons, cutlery, and/or writing: Denies  Review of Systems  Constitutional: Positive for fatigue.  HENT: Positive for mouth dryness. Negative for mouth sores and nose dryness.   Eyes: Negative for pain, itching and dryness.  Respiratory: Negative for shortness of breath and difficulty breathing.   Cardiovascular: Negative for chest pain and palpitations.  Gastrointestinal: Negative for blood in stool, constipation and diarrhea.  Endocrine: Negative for increased urination.  Genitourinary: Negative for difficulty urinating.  Musculoskeletal: Positive for arthralgias, joint pain, joint swelling and morning stiffness. Negative for myalgias, muscle tenderness and myalgias.  Skin: Negative for color change, rash and redness.  Allergic/Immunologic: Negative for susceptible to infections.  Neurological: Positive for headaches. Negative for  dizziness, numbness, memory loss and weakness.  Hematological: Negative for bruising/bleeding tendency.  Psychiatric/Behavioral: Negative for confusion.    PMFS History:  Patient Active Problem List   Diagnosis Date Noted  . Unilateral primary osteoarthritis, left knee 01/10/2018  . Chronic pain of left knee 01/10/2018  . DDD (degenerative disc disease), cervical 03/22/2016  . DDD (degenerative disc disease), lumbar 03/22/2016  . 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 Angioedema 07/15/2014  . Hypokalemia 07/15/2014  . Diabetes mellitus, controlled (HCC) 07/03/2012  . HTN (hypertension) 07/03/2012  . Rheumatoid arthritis (HCC) 07/03/2012  . Current chronic use of systemic steroids 07/03/2012  . 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 DDD (degenerative disc disease), cervical 03/22/2016  . DDD (degenerative disc disease), lumbar 03/22/2016  . GERD (gastroesophageal reflux disease)   . Hyperlipidemia   . Hypertension   . Rheumatoid arthritis (HCC)   . Rheumatoid arthritis(714.0)   . 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 Type II diabetes mellitus (HCC)     Family History  Problem Relation Age of Onset  . Colon polyps Father   . Prostate cancer Father   . Breast cancer Cousin 26  . Colon cancer Neg Hx    Past Surgical History:  Procedure Laterality Date  . APPENDECTOMY  1968  . CATARACT EXTRACTION W/ INTRAOCULAR LENS  IMPLANT, BILATERAL Bilateral 11/2013-12/2013  . KNEE ARTHROPLASTY    . KNEE ARTHROSCOPY Left 1980's-1990's X 3  . MYOMECTOMY  1990's   Social History   Social History Narrative  . Not on file   Immunization History  Administered Date(s)  Administered  . PFIZER(Purple Top)SARS-COV-2 Vaccination 04/23/2019, 05/12/2019, 01/28/2020     Objective: Vital Signs: BP 136/69 (BP Location: Left Arm, Patient Position: Sitting, Cuff Size: Normal)   Pulse 64   Resp 13   Ht 5\' 4"  (1.626 m)   Wt 184 lb 3.2 oz (83.6 kg)   LMP 07/09/2014   BMI 31.62  kg/m    Physical Exam Vitals and nursing note reviewed.  Constitutional:      Appearance: She is well-developed.  HENT:     Head: Normocephalic and atraumatic.  Eyes:     Conjunctiva/sclera: Conjunctivae normal.  Cardiovascular:     Rate and Rhythm: Normal rate and regular rhythm.     Heart sounds: Normal heart sounds.  Pulmonary:     Effort: Pulmonary effort is normal.     Breath sounds: Normal breath sounds.  Abdominal:     General: Bowel sounds are normal.     Palpations: Abdomen is soft.  Musculoskeletal:     Cervical back: Normal range of motion.  Lymphadenopathy:     Cervical: No cervical adenopathy.  Skin:    General: Skin is warm and dry.     Capillary Refill: Capillary refill takes less than 2 seconds.  Neurological:     Mental Status: She is alert and oriented to person, place, and time.  Psychiatric:        Behavior: Behavior normal.      Musculoskeletal Exam: C-spine was in good range of motion.  She has discomfort range of motion of her lumbar spine.  Shoulder joints, elbow joints, wrist joints, MCPs PIPs and DIPs with good range of motion with no synovitis.  Hip joints, knee joints, ankles, MTPs and PIPs with good range of motion.  She had discomfort range of motion of her left knee joint.  CDAI Exam: CDAI Score: 0.4  Patient Global: 3 mm; Provider Global: 1 mm Swollen: 0 ; Tender: 0  Joint Exam 09/06/2020   No joint exam has been documented for this visit   There is currently no information documented on the homunculus. Go to the Rheumatology activity and complete the homunculus joint exam.  Investigation: No additional findings.  Imaging: No results found.  Recent Labs: Lab Results  Component Value Date   WBC 7.1 08/30/2020   HGB 12.1 08/30/2020   PLT 411 (H) 08/30/2020   NA 139 08/30/2020   K 3.8 08/30/2020   CL 98 08/30/2020   CO2 32 08/30/2020   GLUCOSE 137 (H) 08/30/2020   BUN 13 08/30/2020   CREATININE 0.85 08/30/2020   BILITOT 0.4  08/30/2020   ALKPHOS 48 08/30/2020   AST 21 08/30/2020   ALT 15 08/30/2020   PROT 8.2 (H) 08/30/2020   ALBUMIN 3.8 08/30/2020   CALCIUM 10.0 08/30/2020   GFRAA >60 12/17/2019   QFTBGOLD Negative 09/18/2016   QFTBGOLDPLUS Negative 03/03/2020    Speciality Comments: Remicade 6mg / kg every 5 weeks-ACY 12/25/19  Procedures:  No procedures performed Allergies: Latex, Lisinopril, Penicillins, and Methotrexate derivatives   Assessment / Plan:     Visit Diagnoses: Rheumatoid arthritis involving multiple sites with positive rheumatoid factor (HCC) - + RF, Anti-CCP +, ANA -:  Patient had no synovitis on examination.  She is doing well on Remicade IV infusions 5 mg/kg every 5 weeks.  She has been off methotrexate.  She has not noticed any improvement coming off methotrexate.  She denies any joint swelling.  She has not had a scleritis flare.  I did made her aware  that there is increased risk of developing antibodies against Remicade as a monotherapy.  I did not notice any synovitis on my examination today.  High risk medication use - Remicade IV infusion 5 mg/kg every 5 weeks.  Discontinued methotrexate in July 2021 due to GI side effects.  Her labs are stable.  She will get labs with her infusions.  TB gold is due in November 2022.  She has been advised to stop Remicade in case she develops an infection and then resume after the infection resolves.  I also gave her information about the booster (fourth dose) for the COVID-19 per ACR recommendations.  The handout was placed in the AVS.  Patient states she has not had pneumococcal or Shingrix vaccine.  I advised her to discuss that further with her PCP.  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  Primary osteoarthritis of both hands-she has some stiffness but no synovitis in her hands.  Primary osteoarthritis of both knees-she continues to have discomfort in the left knee joint.  I gave her a handout on knee joint exercises.   Weight loss diet and exercise was emphasized.  Patient is interested in water therapy.   DDD (degenerative disc disease), cervical-she had good range of motion without discomfort.  DDD (degenerative disc disease), lumbar-she continues to have some lower back pain.  She states she has done physical therapy in the past although she is interested in water therapy.  I gave her a handout on back exercises.  History of diabetes mellitus, type II  Essential hypertension-her blood pressure is normal today.  Increased risk of heart disease with rheumatoid arthritis was discussed.  Need for regular exercise and dietary modifications were discussed and the handout was placed in the AVS.  History of vitamin D deficiency  Osteopenia of multiple sites - July 04, 2016 DXA The BMD measured at Femur Neck Left is 0.870 g/cm2 with a T-score of-1.2.  Followed by her PCP.  Orders: Orders Placed This Encounter  Procedures  . Ambulatory referral to Physical Therapy   No orders of the defined types were placed in this encounter.    Follow-Up Instructions: Return in about 5 months (around 02/06/2021) for Rheumatoid arthritis, Osteoarthritis.   Pollyann Savoy, MD  Note - This record has been created using Animal nutritionist.  Chart creation errors have been sought, but may not always  have been located. Such creation errors do not reflect on  the standard of medical care.

## 2020-08-30 ENCOUNTER — Other Ambulatory Visit: Payer: Self-pay

## 2020-08-30 ENCOUNTER — Encounter (HOSPITAL_COMMUNITY)
Admission: RE | Admit: 2020-08-30 | Discharge: 2020-08-30 | Disposition: A | Discharge status: Home / Self Care | Payer: Medicare Other | Source: Ambulatory Visit | Attending: Rheumatology | Admitting: Rheumatology

## 2020-08-30 DIAGNOSIS — Z79899 Other long term (current) drug therapy: Secondary | ICD-10-CM | POA: Diagnosis present

## 2020-08-30 DIAGNOSIS — M0579 Rheumatoid arthritis with rheumatoid factor of multiple sites without organ or systems involvement: Secondary | ICD-10-CM | POA: Diagnosis not present

## 2020-08-30 LAB — CBC WITH DIFFERENTIAL/PLATELET
Abs Immature Granulocytes: 0.02 10*3/uL (ref 0.00–0.07)
Basophils Absolute: 0 10*3/uL (ref 0.0–0.1)
Basophils Relative: 1 %
Eosinophils Absolute: 0.4 10*3/uL (ref 0.0–0.5)
Eosinophils Relative: 6 %
HCT: 36.4 % (ref 36.0–46.0)
Hemoglobin: 12.1 g/dL (ref 12.0–15.0)
Immature Granulocytes: 0 %
Lymphocytes Relative: 38 %
Lymphs Abs: 2.7 10*3/uL (ref 0.7–4.0)
MCH: 30.1 pg (ref 26.0–34.0)
MCHC: 33.2 g/dL (ref 30.0–36.0)
MCV: 90.5 fL (ref 80.0–100.0)
Monocytes Absolute: 0.5 10*3/uL (ref 0.1–1.0)
Monocytes Relative: 7 %
Neutro Abs: 3.5 10*3/uL (ref 1.7–7.7)
Neutrophils Relative %: 48 %
Platelets: 411 10*3/uL — ABNORMAL HIGH (ref 150–400)
RBC: 4.02 MIL/uL (ref 3.87–5.11)
RDW: 13.2 % (ref 11.5–15.5)
WBC: 7.1 10*3/uL (ref 4.0–10.5)
nRBC: 0 % (ref 0.0–0.2)

## 2020-08-30 LAB — COMPREHENSIVE METABOLIC PANEL
ALT: 15 U/L (ref 0–44)
AST: 21 U/L (ref 15–41)
Albumin: 3.8 g/dL (ref 3.5–5.0)
Alkaline Phosphatase: 48 U/L (ref 38–126)
Anion gap: 9 (ref 5–15)
BUN: 13 mg/dL (ref 6–20)
CO2: 32 mmol/L (ref 22–32)
Calcium: 10 mg/dL (ref 8.9–10.3)
Chloride: 98 mmol/L (ref 98–111)
Creatinine, Ser: 0.85 mg/dL (ref 0.44–1.00)
GFR, Estimated: >60 mL/min (ref >60)
Glucose, Bld: 137 mg/dL — ABNORMAL HIGH (ref 70–99)
Potassium: 3.8 mmol/L (ref 3.5–5.1)
Sodium: 139 mmol/L (ref 135–145)
Total Bilirubin: 0.4 mg/dL (ref 0.3–1.2)
Total Protein: 8.2 g/dL — ABNORMAL HIGH (ref 6.5–8.1)

## 2020-08-30 MED ORDER — SODIUM CHLORIDE 0.9 % IV SOLN
5.0000 mg/kg | INTRAVENOUS | Status: DC
  Administered 2020-08-30: 400 mg via INTRAVENOUS
  Filled 2020-08-30: qty 40

## 2020-08-30 MED ORDER — DIPHENHYDRAMINE HCL 25 MG PO CAPS: 25.0000 mg | ORAL_CAPSULE | ORAL | Status: DC

## 2020-08-30 MED ORDER — ACETAMINOPHEN 325 MG PO TABS: 650.0000 mg | ORAL_TABLET | ORAL | Status: DC

## 2020-08-30 NOTE — Progress Notes (Signed)
Platelet count is borderline elevated-411.  Rest of CBC WNL.

## 2020-08-30 NOTE — Progress Notes (Signed)
Glucose is 137. Total protein is borderline elevated-8.2.  rest of CMP WNL.  We will continue to monitor.

## 2020-09-06 ENCOUNTER — Ambulatory Visit (INDEPENDENT_AMBULATORY_CARE_PROVIDER_SITE_OTHER): Payer: Medicare Other | Admitting: Rheumatology

## 2020-09-06 ENCOUNTER — Other Ambulatory Visit: Payer: Self-pay

## 2020-09-06 ENCOUNTER — Encounter: Payer: Self-pay | Admitting: Rheumatology

## 2020-09-06 VITALS — BP 136/69 | HR 64 | Resp 13 | Ht 64.0 in | Wt 184.2 lb

## 2020-09-06 DIAGNOSIS — Z79899 Other long term (current) drug therapy: Secondary | ICD-10-CM

## 2020-09-06 DIAGNOSIS — M8589 Other specified disorders of bone density and structure, multiple sites: Secondary | ICD-10-CM

## 2020-09-06 DIAGNOSIS — M0579 Rheumatoid arthritis with rheumatoid factor of multiple sites without organ or systems involvement: Secondary | ICD-10-CM

## 2020-09-06 DIAGNOSIS — H15003 Unspecified scleritis, bilateral: Secondary | ICD-10-CM | POA: Diagnosis not present

## 2020-09-06 DIAGNOSIS — M170 Bilateral primary osteoarthritis of knee: Secondary | ICD-10-CM

## 2020-09-06 DIAGNOSIS — M5136 Other intervertebral disc degeneration, lumbar region: Secondary | ICD-10-CM

## 2020-09-06 DIAGNOSIS — I10 Essential (primary) hypertension: Secondary | ICD-10-CM

## 2020-09-06 DIAGNOSIS — M19042 Primary osteoarthritis, left hand: Secondary | ICD-10-CM

## 2020-09-06 DIAGNOSIS — Z8639 Personal history of other endocrine, nutritional and metabolic disease: Secondary | ICD-10-CM

## 2020-09-06 DIAGNOSIS — M19041 Primary osteoarthritis, right hand: Secondary | ICD-10-CM | POA: Diagnosis not present

## 2020-09-06 DIAGNOSIS — M5030 Other cervical disc degeneration, unspecified cervical region: Secondary | ICD-10-CM

## 2020-09-06 DIAGNOSIS — M67432 Ganglion, left wrist: Secondary | ICD-10-CM

## 2020-09-06 DIAGNOSIS — M7062 Trochanteric bursitis, left hip: Secondary | ICD-10-CM

## 2020-09-06 NOTE — Patient Instructions (Addendum)
COVID-19 vaccine recommendations:   COVID-19 vaccine is recommended for everyone (unless you are allergic to a vaccine component), even if you are on a medication that suppresses your immune system.   The recommendations are that the individuals who are on immunosuppressive agents should get first 3 COVID-19 vaccines  1 month apart and the fourth dose (booster) 3 months after the third dose.  Do not take Tylenol or any anti-inflammatory medications (NSAIDs) 24 hours prior to the COVID-19 vaccination.   There is no direct evidence about the efficacy of the COVID-19 vaccine in individuals who are on medications that suppress the immune system.   Even if you are fully vaccinated, and you are on any medications that suppress your immune system, please continue to wear a mask, maintain at least six feet social distance and practice hand hygiene.   If you develop a COVID-19 infection, please contact your PCP or our office to determine if you need monoclonal antibody infusion.  The booster vaccine is now available for immunocompromised patients.   Please see the following web sites for updated information.   [email or web address]  Vaccines You are taking a medication(s) that can suppress your immune system.  The following immunizations are recommended: . Flu annually . Covid-19  . Pneumonia (Pneumovax 23 and Prevnar 13 spaced at least 1 year apart) . Shingrix (after age 55)  Please check with your PCP to make sure you are up to date.  Heart Disease Prevention   Your inflammatory disease increases your risk of heart disease which includes heart attack, stroke, atrial fibrillation (irregular heartbeats), high blood pressure, heart failure and atherosclerosis (plaque in the arteries).  It is important to reduce your risk by:   . Keep blood pressure, cholesterol, and blood sugar at healthy levels   . Smoking Cessation    . Maintain a healthy weight  o BMI 20-25   . Eat a healthy diet  o Plenty of fresh fruit, vegetables, and whole grains  o Limit saturated fats, foods high in sodium, and added sugars  o DASH and Mediterranean diet   . Increase physical activity  o Recommend moderate physically activity for 150 minutes per week/ 30 minutes a day for five days a week These can be broken up into three separate ten-minute sessions during the day.   . Reduce Stress  . Meditation, slow breathing exercises, yoga, coloring books  . Dental visits twice a year    Journal for Nurse Practitioners, 15(4), [phone]. Retrieved February 04, 2018 from http://clinicalkey.com/nursing">  Knee Exercises Ask your health care provider which exercises are safe for you. Do exercises exactly as told by your health care provider and adjust them as directed. It is normal to feel mild stretching, pulling, tightness, or discomfort as you do these exercises. Stop right away if you feel sudden pain or your pain gets worse. Do not begin these exercises until told by your health care provider. Stretching and range-of-motion exercises These exercises warm up your muscles and joints and improve the movement and flexibility of your knee. These exercises also help to relieve pain and swelling. Knee extension, prone 1. Lie on your abdomen (prone position) on a bed. 2. Place your left / right knee just beyond the edge of the surface so your knee is not on the bed. You can put a towel under your left / right thigh just above your kneecap for comfort. 3. Relax your leg muscles and allow gravity to straighten your knee (extension). You should feel a  stretch behind your left / right knee. 4. Hold this position for __________ seconds. 5. Scoot up so your knee is supported between repetitions. Repeat __________ times. Complete this exercise __________ times a day. Knee flexion, active 1. Lie on your back with both legs straight. If this causes back  discomfort, bend your left / right knee so your foot is flat on the floor. 2. Slowly slide your left / right heel back toward your buttocks. Stop when you feel a gentle stretch in the front of your knee or thigh (flexion). 3. Hold this position for __________ seconds. 4. Slowly slide your left / right heel back to the starting position. Repeat __________ times. Complete this exercise __________ times a day.   Quadriceps stretch, prone 1. Lie on your abdomen on a firm surface, such as a bed or padded floor. 2. Bend your left / right knee and hold your ankle. If you cannot reach your ankle or pant leg, loop a belt around your foot and grab the belt instead. 3. Gently pull your heel toward your buttocks. Your knee should not slide out to the side. You should feel a stretch in the front of your thigh and knee (quadriceps). 4. Hold this position for __________ seconds. Repeat __________ times. Complete this exercise __________ times a day.   Hamstring, supine 1. Lie on your back (supine position). 2. Loop a belt or towel over the ball of your left / right foot. The ball of your foot is on the walking surface, right under your toes. 3. Straighten your left / right knee and slowly pull on the belt to raise your leg until you feel a gentle stretch behind your knee (hamstring). ? Do not let your knee bend while you do this. ? Keep your other leg flat on the floor. 4. Hold this position for __________ seconds. Repeat __________ times. Complete this exercise __________ times a day. Strengthening exercises These exercises build strength and endurance in your knee. Endurance is the ability to use your muscles for a long time, even after they get tired. Quadriceps, isometric This exercise stretches the muscles in front of your thigh (quadriceps) without moving your knee joint (isometric). 1. Lie on your back with your left / right leg extended and your other knee bent. Put a rolled towel or small pillow  under your knee if told by your health care provider. 2. Slowly tense the muscles in the front of your left / right thigh. You should see your kneecap slide up toward your hip or see increased dimpling just above the knee. This motion will push the back of the knee toward the floor. 3. For __________ seconds, hold the muscle as tight as you can without increasing your pain. 4. Relax the muscles slowly and completely. Repeat __________ times. Complete this exercise __________ times a day.   Straight leg raises This exercise stretches the muscles in front of your thigh (quadriceps) and the muscles that move your hips (hip flexors). 1. Lie on your back with your left / right leg extended and your other knee bent. 2. Tense the muscles in the front of your left / right thigh. You should see your kneecap slide up or see increased dimpling just above the knee. Your thigh may even shake a bit. 3. Keep these muscles tight as you raise your leg 4-6 inches (10-15 cm) off the floor. Do not let your knee bend. 4. Hold this position for __________ seconds. 5. Keep these muscles tense as  you lower your leg. 6. Relax your muscles slowly and completely after each repetition. Repeat __________ times. Complete this exercise __________ times a day. Hamstring, isometric 1. Lie on your back on a firm surface. 2. Bend your left / right knee about __________ degrees. 3. Dig your left / right heel into the surface as if you are trying to pull it toward your buttocks. Tighten the muscles in the back of your thighs (hamstring) to "dig" as hard as you can without increasing any pain. 4. Hold this position for __________ seconds. 5. Release the tension gradually and allow your muscles to relax completely for __________ seconds after each repetition. Repeat __________ times. Complete this exercise __________ times a day. Hamstring curls If told by your health care provider, do this exercise while wearing ankle weights.  Begin with __________ lb weights. Then increase the weight by 1 lb (0.5 kg) increments. Do not wear ankle weights that are more than __________ lb. 1. Lie on your abdomen with your legs straight. 2. Bend your left / right knee as far as you can without feeling pain. Keep your hips flat against the floor. 3. Hold this position for __________ seconds. 4. Slowly lower your leg to the starting position. Repeat __________ times. Complete this exercise __________ times a day.   Squats This exercise strengthens the muscles in front of your thigh and knee (quadriceps). 1. Stand in front of a table, with your feet and knees pointing straight ahead. You may rest your hands on the table for balance but not for support. 2. Slowly bend your knees and lower your hips like you are going to sit in a chair. ? Keep your weight over your heels, not over your toes. ? Keep your lower legs upright so they are parallel with the table legs. ? Do not let your hips go lower than your knees. ? Do not bend lower than told by your health care provider. ? If your knee pain increases, do not bend as low. 3. Hold the squat position for __________ seconds. 4. Slowly push with your legs to return to standing. Do not use your hands to pull yourself to standing. Repeat __________ times. Complete this exercise __________ times a day. Wall slides This exercise strengthens the muscles in front of your thigh and knee (quadriceps). 1. Lean your back against a smooth wall or door, and walk your feet out 18-24 inches (46-61 cm) from it. 2. Place your feet hip-width apart. 3. Slowly slide down the wall or door until your knees bend __________ degrees. Keep your knees over your heels, not over your toes. Keep your knees in line with your hips. 4. Hold this position for __________ seconds. Repeat __________ times. Complete this exercise __________ times a day.   Straight leg raises This exercise strengthens the muscles that rotate the  leg at the hip and move it away from your body (hip abductors). 1. Lie on your side with your left / right leg in the top position. Lie so your head, shoulder, knee, and hip line up. You may bend your bottom knee to help you keep your balance. 2. Roll your hips slightly forward so your hips are stacked directly over each other and your left / right knee is facing forward. 3. Leading with your heel, lift your top leg 4-6 inches (10-15 cm). You should feel the muscles in your outer hip lifting. ? Do not let your foot drift forward. ? Do not let your knee roll toward  the ceiling. 4. Hold this position for __________ seconds. 5. Slowly return your leg to the starting position. 6. Let your muscles relax completely after each repetition. Repeat __________ times. Complete this exercise __________ times a day.   Straight leg raises This exercise stretches the muscles that move your hips away from the front of the pelvis (hip extensors). 1. Lie on your abdomen on a firm surface. You can put a pillow under your hips if that is more comfortable. 2. Tense the muscles in your buttocks and lift your left / right leg about 4-6 inches (10-15 cm). Keep your knee straight as you lift your leg. 3. Hold this position for __________ seconds. 4. Slowly lower your leg to the starting position. 5. Let your leg relax completely after each repetition. Repeat __________ times. Complete this exercise __________ times a day. This information is not intended to replace advice given to you by your health care provider. Make sure you discuss any questions you have with your health care provider. Document Revised: 02/05/2018 Document Reviewed: 02/05/2018 [employer] Patient Education  2021 [employer]   Back Exercises The following exercises strengthen the muscles that help to support the trunk and back. They also help to keep the lower back flexible. Doing these exercises can help to prevent back pain or lessen existing  pain.  If you have back pain or discomfort, try doing these exercises 2-3 times each day or as told by your health care provider.  As your pain improves, do them once each day, but increase the number of times that you repeat the steps for each exercise (do more repetitions).  To prevent the recurrence of back pain, continue to do these exercises once each day or as told by your health care provider. Do exercises exactly as told by your health care provider and adjust them as directed. It is normal to feel mild stretching, pulling, tightness, or discomfort as you do these exercises, but you should stop right away if you feel sudden pain or your pain gets worse. Exercises Single knee to chest Repeat these steps 3-5 times for each leg: 5. Lie on your back on a firm bed or the floor with your legs extended. 6. Bring one knee to your chest. Your other leg should stay extended and in contact with the floor. 7. Hold your knee in place by grabbing your knee or thigh with both hands and hold. 8. Pull on your knee until you feel a gentle stretch in your lower back or buttocks. 9. Hold the stretch for 10-30 seconds. 10. Slowly release and straighten your leg. Pelvic tilt Repeat these steps 5-10 times: 5. Lie on your back on a firm bed or the floor with your legs extended. 6. Bend your knees so they are pointing toward the ceiling and your feet are flat on the floor. 7. Tighten your lower abdominal muscles to press your lower back against the floor. This motion will tilt your pelvis so your tailbone points up toward the ceiling instead of pointing to your feet or the floor. 8. With gentle tension and even breathing, hold this position for 5-10 seconds. Cat-cow Repeat these steps until your lower back becomes more flexible: 5. Get into a hands-and-knees position on a firm surface. Keep your hands under your shoulders, and keep your knees under your hips. You may place padding under your knees for  comfort. 6. Let your head hang down toward your chest. Contract your abdominal muscles and point your tailbone toward  the floor so your lower back becomes rounded like the back of a cat. 7. Hold this position for 5 seconds. 8. Slowly lift your head, let your abdominal muscles relax and point your tailbone up toward the ceiling so your back forms a sagging arch like the back of a cow. 9. Hold this position for 5 seconds.   Press-ups Repeat these steps 5-10 times: 5. Lie on your abdomen (face-down) on the floor. 6. Place your palms near your head, about shoulder-width apart. 7. Keeping your back as relaxed as possible and keeping your hips on the floor, slowly straighten your arms to raise the top half of your body and lift your shoulders. Do not use your back muscles to raise your upper torso. You may adjust the placement of your hands to make yourself more comfortable. 8. Hold this position for 5 seconds while you keep your back relaxed. 9. Slowly return to lying flat on the floor.   Bridges Repeat these steps 10 times: 7. Lie on your back on a firm surface. 8. Bend your knees so they are pointing toward the ceiling and your feet are flat on the floor. Your arms should be flat at your sides, next to your body. 9. Tighten your buttocks muscles and lift your buttocks off the floor until your waist is at almost the same height as your knees. You should feel the muscles working in your buttocks and the back of your thighs. If you do not feel these muscles, slide your feet 1-2 inches farther away from your buttocks. 10. Hold this position for 3-5 seconds. 11. Slowly lower your hips to the starting position, and allow your buttocks muscles to relax completely. If this exercise is too easy, try doing it with your arms crossed over your chest.   Abdominal crunches Repeat these steps 5-10 times: 6. Lie on your back on a firm bed or the floor with your legs extended. 7. Bend your knees so they are  pointing toward the ceiling and your feet are flat on the floor. 8. Cross your arms over your chest. 9. Tip your chin slightly toward your chest without bending your neck. 10. Tighten your abdominal muscles and slowly raise your trunk (torso) high enough to lift your shoulder blades a tiny bit off the floor. Avoid raising your torso higher than that because it can put too much stress on your low back and does not help to strengthen your abdominal muscles. 11. Slowly return to your starting position. Back lifts Repeat these steps 5-10 times: 5. Lie on your abdomen (face-down) with your arms at your sides, and rest your forehead on the floor. 6. Tighten the muscles in your legs and your buttocks. 7. Slowly lift your chest off the floor while you keep your hips pressed to the floor. Keep the back of your head in line with the curve in your back. Your eyes should be looking at the floor. 8. Hold this position for 3-5 seconds. 9. Slowly return to your starting position. Contact a health care provider if:  Your back pain or discomfort gets much worse when you do an exercise.  Your worsening back pain or discomfort does not lessen within 2 hours after you exercise. If you have any of these problems, stop doing these exercises right away. Do not do them again unless your health care provider says that you can. Get help right away if:  You develop sudden, severe back pain. If this happens, stop doing the exercises  right away. Do not do them again unless your health care provider says that you can. This information is not intended to replace advice given to you by your health care provider. Make sure you discuss any questions you have with your health care provider. Document Revised: 08/22/2018 Document Reviewed: 01/17/2018 [employer] Patient Education  2021 [employer].

## 2020-09-15 ENCOUNTER — Ambulatory Visit (HOSPITAL_COMMUNITY): Payer: Medicare Other

## 2020-10-04 ENCOUNTER — Other Ambulatory Visit: Payer: Self-pay

## 2020-10-04 ENCOUNTER — Ambulatory Visit (HOSPITAL_COMMUNITY)
Admission: RE | Admit: 2020-10-04 | Discharge: 2020-10-04 | Disposition: A | Discharge status: Home / Self Care | Payer: Medicare Other | Source: Ambulatory Visit | Attending: Rheumatology | Admitting: Rheumatology

## 2020-10-04 DIAGNOSIS — M0579 Rheumatoid arthritis with rheumatoid factor of multiple sites without organ or systems involvement: Secondary | ICD-10-CM | POA: Insufficient documentation

## 2020-10-04 MED ORDER — SODIUM CHLORIDE 0.9 % IV SOLN
5.0000 mg/kg | INTRAVENOUS | Status: DC
  Administered 2020-10-04: 400 mg via INTRAVENOUS
  Filled 2020-10-04: qty 40

## 2020-10-04 MED ORDER — DIPHENHYDRAMINE HCL 25 MG PO CAPS: 25.0000 mg | ORAL_CAPSULE | ORAL | Status: DC

## 2020-10-04 MED ORDER — ACETAMINOPHEN 325 MG PO TABS: 650.0000 mg | ORAL_TABLET | ORAL | Status: DC

## 2020-10-06 ENCOUNTER — Ambulatory Visit (HOSPITAL_BASED_OUTPATIENT_CLINIC_OR_DEPARTMENT_OTHER): Payer: Medicare Other | Attending: Rheumatology | Admitting: Physical Therapy

## 2020-10-06 ENCOUNTER — Other Ambulatory Visit: Payer: Self-pay

## 2020-10-06 ENCOUNTER — Encounter (HOSPITAL_BASED_OUTPATIENT_CLINIC_OR_DEPARTMENT_OTHER): Payer: Self-pay | Admitting: Physical Therapy

## 2020-10-06 DIAGNOSIS — G8929 Other chronic pain: Secondary | ICD-10-CM | POA: Diagnosis present

## 2020-10-06 DIAGNOSIS — M25552 Pain in left hip: Secondary | ICD-10-CM | POA: Insufficient documentation

## 2020-10-06 DIAGNOSIS — R29898 Other symptoms and signs involving the musculoskeletal system: Secondary | ICD-10-CM | POA: Diagnosis present

## 2020-10-06 DIAGNOSIS — M25562 Pain in left knee: Secondary | ICD-10-CM | POA: Diagnosis present

## 2020-10-06 DIAGNOSIS — M545 Low back pain, unspecified: Secondary | ICD-10-CM | POA: Diagnosis not present

## 2020-10-06 DIAGNOSIS — M25561 Pain in right knee: Secondary | ICD-10-CM | POA: Diagnosis present

## 2020-10-06 DIAGNOSIS — R2689 Other abnormalities of gait and mobility: Secondary | ICD-10-CM | POA: Insufficient documentation

## 2020-10-06 DIAGNOSIS — M6281 Muscle weakness (generalized): Secondary | ICD-10-CM

## 2020-10-06 NOTE — Therapy (Signed)
[facility] [address] [place], [name], [address] Phone: [phone]   Fax:  [phone]  Physical Therapy Evaluation  Patient Details  Name: Wendy Woods MRN: [id number] Date of Birth: 08/15/1960 Referring Provider (PT): Dr Clementeen Hoof   Encounter Date: 10/06/2020   PT End of Session - 10/06/20 1347    Visit Number 1    Number of Visits 12    Date for PT Re-Evaluation 11/17/20    Authorization Type [employer] medicare 10 visit progress note 15 visit Kx    PT Start Time 0845    PT Stop Time 0927    PT Time Calculation (min) 42 min    Activity Tolerance Patient tolerated treatment well    Behavior During Therapy [facility] for tasks assessed/performed           Past Medical History:  Diagnosis Date  . DDD (degenerative disc disease), cervical 03/22/2016  . DDD (degenerative disc disease), lumbar 03/22/2016  . GERD (gastroesophageal reflux disease)   . Hyperlipidemia   . Hypertension   . Rheumatoid arthritis (HCC)   . Rheumatoid arthritis(714.0)   . 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 Type II diabetes mellitus (HCC)     Past Surgical History:  Procedure Laterality Date  . APPENDECTOMY  1968  . CATARACT EXTRACTION W/ INTRAOCULAR LENS  IMPLANT, BILATERAL Bilateral 11/2013-12/2013  . KNEE ARTHROPLASTY    . KNEE ARTHROSCOPY Left 1980's-1990's X 3  . MYOMECTOMY  1990's    There were no vitals filed for this visit.    Subjective Assessment - 10/06/20 0850    Subjective Patient has a long history of lower back pain and bilateral knee pain for several years. She has increased pain when she sits for too long. It takes her some time to get to standing when she sits for too long. She also has increased pain with standing and walking. She works in [employer] at home.    Pertinent History [name]; DDD cervical; DDD lumbar; DMIIl;    Limitations Standing;Lifting    How long can you sit comfortably? knees stiffne when she sits     How long can you stand comfortably? limited ability to stand    How long can you walk comfortably? limtied walking dsitances    Diagnostic tests X-rays: left Knee 2019 tricompartmental degeneration Lumbar x-ray: 2021: multilevel spondylosis and levo-scoliosis    Patient Stated Goals to have less pain    Currently in Pain? Yes    Pain Score 8     Pain Location Back    Pain Orientation Left;Lower    Pain Descriptors / Indicators Aching    Pain Type Chronic pain    Aggravating Factors  sitting still for too long; night time and the morning    Pain Relieving Factors movement    Multiple Pain Sites Yes    Pain Score 8    Pain Location Knee    Pain Orientation Left    Pain Descriptors / Indicators Aching    Pain Type Chronic pain    Pain Onset More than a month ago    Pain Frequency Constant    Aggravating Factors  sitting for too long then standing    Pain Relieving Factors movement    Effect of Pain on Daily Activities difficulty perfroming daily tasks              [facility] PT Assessment - 10/06/20 0001      Assessment   Medical Diagnosis Bilateral Knee and Back  Pain    Referring Provider (PT) Dr Clementeen Hoof    Onset Date/Surgical Date --   Many years   Hand Dominance Right    Next MD Visit October or November    Prior Therapy Had therapy for back and knees in the past. Has had some benefit      Precautions   Precautions None      Restrictions   Weight Bearing Restrictions No      Balance Screen   Has the patient fallen in the past 6 months No    Has the patient had a decrease in activity level because of a fear of falling?  No    Is the patient reluctant to leave their home because of a fear of falling?  No      Home [employer]ist information centre manager residence    Additional Comments 2 level house; bedroom on the second floor      Prior Function   Level of Independence Independent    Vocation Part time employment    Vocation Requirements works in [employer]    Leisure running      Cognition   Overall Cognitive Status Within Functional Limits for tasks assessed    Attention Focused    Focused Attention Appears intact    Memory Appears intact    Awareness Appears intact    Problem Solving Appears intact      Sensation   Light Touch Appears Intact    Additional Comments denies parathesias      Coordination   Gross Motor Movements are Fluid and Coordinated Yes    Fine Motor Movements are Fluid and Coordinated Yes      Posture/Postural Control   Posture Comments forward head      ROM / Strength   AROM / PROM / Strength AROM;PROM;Strength      AROM   AROM Assessment Site Lumbar    Lumbar Flexion 55    Lumbar Extension painful at end range    Lumbar - Right Side Bend painful on the right    Lumbar - Left Side Bend painful on the left    Lumbar - Right Rotation painful at end range on the left    Lumbar - Left Rotation painful limited 25%      PROM   PROM Assessment Site Hip    Right/Left Hip Right;Left    Right Hip Flexion 90   pain in the back   Left Hip Flexion 105      Strength   Strength Assessment Site Hip;Knee    Right/Left Hip Right;Left    Right Hip Flexion 3+/5    Right Hip ABduction 4+/5    Right Hip ADduction 5/5    Left Hip Flexion 3+/5    Left Hip ABduction 4/5    Left Hip ADduction 4+/5      Palpation   Spinal mobility appears to have mild scoliosis    Palpation comment spasming of bilateral lumbar paraspinals L>R;                      Objective measurements completed on examination: See above findings.       [facility] Adult PT Treatment/Exercise - 10/06/20 0001      Lumbar Exercises: Stretches   Lower Trunk Rotation Limitations x20    Piriformis Stretch Limitations 3x20 sec hold      Knee/Hip Exercises: Supine   Quad Sets Limitations 2x10 5 sec hold  PT Education - 10/06/20 1347    Education Details reviewed HEP and symptom mangement    Person(s)  Educated Patient    Methods Explanation;Demonstration;Tactile cues;Verbal cues    Comprehension Verbalized understanding;Returned demonstration;Verbal cues required;Tactile cues required            PT Short Term Goals - 10/06/20 2052      PT SHORT TERM GOAL #1   Title Patient will increase bilateral hip flexion to 105 degrees without significant pain    Time 3    Period Weeks    Status New    Target Date 10/27/20      PT SHORT TERM GOAL #2   Title Patient will increase bilateral hip stength to 4/5    Time 3    Period Weeks    Status New    Target Date 10/27/20      PT SHORT TERM GOAL #3   Title Patient will increase lumbar flexion to 60 degrees without increased pain    Time 3    Period Weeks    Status New    Target Date 10/27/20             PT Long Term Goals - 10/06/20 2057      PT LONG TERM GOAL #1   Title Patient will be indepdnent with water program to promote strengthening without a signifcant increase in pain    Time 6    Period Weeks    Status New    Target Date 11/17/20      PT LONG TERM GOAL #2   Title Patient will exhibit improved hip strength grossly >/= 4/5 and knee strength grossly >/= 4+/5 MMT to improve ability to stand and walk longer periods    Time 6    Period Weeks    Status New    Target Date 11/17/20      PT LONG TERM GOAL #3   Title Patient will ambualte in the community witha reported 50 reduction in pain    Time 6    Period Weeks    Status New    Target Date 11/17/20                  Plan - 10/06/20 1348    Clinical Impression Statement Patient is a 60 year old female with bilateral bilateral knee pain L > R and left side lumbar spine. She has increased pain with all end range lumbar motion. She has limited passive hip flexion bilateral. She has pain with end range knee flexion bilateral. She stiffens when she sits for too long. She has had some improvement with PT in the past. She has spasming in her left lumbar spine and  gluteal. She has not done sqautic therapy in the past. She would benefit from skilled therapy to improve pain when she sits for too long.    Personal Factors and Comorbidities Profession;Comorbidity 3+;Comorbidity 1;Comorbidity 2    Comorbidities RA, DMII, cervical DDD, lumbar DDD    Examination-Activity Limitations Sit;Squat;Stairs;Stand    Examination-Participation Restrictions Cleaning;Occupation;Yard Work;Community Activity;Shop    Stability/Clinical Decision Making Evolving/Moderate complexity   progresive decrease in mobility   Clinical Decision Making Moderate    PT Frequency 2x / week    PT Duration 6 weeks    PT Treatment/Interventions ADLs/Self Care Home Management;Cryotherapy;Electrical Stimulation;Moist Heat;Ultrasound;DME Instruction;Gait training;Stair training;Functional mobility training;Therapeutic activities;Therapeutic exercise;Neuromuscular re-education;Patient/family education;Manual techniques;Passive range of motion;Dry needling;Taping;Joint Manipulations    PT Next Visit Plan in the pool work on progressive  weight bearing activity; Steps; squats; decompression exercises in the back noodle stabilzation exercises    PT Home Exercise Plan Access Code: [email or web address]  URL: [email or web address]  Date: 10/06/2020  Prepared by: Lorayne Bender    Exercises  Supine Lower Trunk Rotation - 1 x daily - 7 x weekly - 3 sets - 10 reps  Supine Piriformis Stretch with Foot on Ground - 1 x daily - 7 x weekly - 3 sets - 10 reps  Supine Quad Set - 1 x daily - 7 x weekly - 3 sets - 10 reps - 5 sec hold  Standing Glute Med Mobilization with Small Ball on Wall - 1 x daily - 7 x weekly - 3 sets - 10 reps - 1-2 min hold    Consulted and Agree with Plan of Care Patient           Patient will benefit from skilled therapeutic intervention in order to improve the following deficits and impairments:  Abnormal gait,Decreased endurance,Decreased mobility,Increased muscle spasms,Decreased range of  motion,Decreased activity tolerance,Pain,Increased fascial restricitons,Decreased strength,Difficulty walking  Visit Diagnosis: Chronic bilateral low back pain, unspecified whether sciatica present  Pain in left hip  Chronic pain of right knee  Chronic pain of left knee  Muscle weakness (generalized)  Other abnormalities of gait and mobility     Problem List Patient Active Problem List   Diagnosis Date Noted  . Unilateral primary osteoarthritis, left knee 01/10/2018  . Chronic pain of left knee 01/10/2018  . DDD (degenerative disc disease), cervical 03/22/2016  . DDD (degenerative disc disease), lumbar 03/22/2016  . 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 Angioedema 07/15/2014  . Hypokalemia 07/15/2014  . Diabetes mellitus, controlled (HCC) 07/03/2012  . HTN (hypertension) 07/03/2012  . Rheumatoid arthritis (HCC) 07/03/2012  . Current chronic use of systemic steroids 07/03/2012  . 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 [place], [name], [address] Phone: [phone]   Fax:  [phone]  Name: Dave Arters MRN: [id number] Date of Birth: 02/28/1961

## 2020-10-08 ENCOUNTER — Other Ambulatory Visit: Payer: Self-pay | Admitting: Pharmacist

## 2020-10-08 DIAGNOSIS — Z79899 Other long term (current) drug therapy: Secondary | ICD-10-CM

## 2020-10-08 DIAGNOSIS — M0579 Rheumatoid arthritis with rheumatoid factor of multiple sites without organ or systems involvement: Secondary | ICD-10-CM

## 2020-10-08 NOTE — Progress Notes (Signed)
Next infusion scheduled for Remicade on 11/08/20 and due for updated orders. Diagnosis: rheumatoid arthirits  Dose: 5 mg/kg every 5 weeks (400mg  based on last recorded weight of 83kg)  Last Clinic Visit: 09/06/20 Next Clinic Visit: 02/07/21  Last infusion: 10/04/20 Labs: CBC and CMP on 08/30/20. Platelets borderline elevated. Elevated glucose. TB Gold: negative on 03/03/20   Orders placed for Remicade 5mg /kg x 2 doses along with premedication of Tylenol and Benadryl to be administered 30 minutes before medication infusion.  Standing CBC with diff/platelet and CMP with GFR orders placed to be drawn every 2 months.  Next TB gold due November 2022  , PharmD, MPH Clinical Pharmacist (Rheumatology and Pulmonology)

## 2020-10-13 ENCOUNTER — Other Ambulatory Visit: Payer: Self-pay

## 2020-10-13 ENCOUNTER — Ambulatory Visit (HOSPITAL_BASED_OUTPATIENT_CLINIC_OR_DEPARTMENT_OTHER): Payer: Medicare Other | Admitting: Physical Therapy

## 2020-10-13 DIAGNOSIS — M25562 Pain in left knee: Secondary | ICD-10-CM

## 2020-10-13 DIAGNOSIS — M545 Low back pain, unspecified: Secondary | ICD-10-CM

## 2020-10-13 DIAGNOSIS — R2689 Other abnormalities of gait and mobility: Secondary | ICD-10-CM

## 2020-10-13 DIAGNOSIS — R29898 Other symptoms and signs involving the musculoskeletal system: Secondary | ICD-10-CM

## 2020-10-13 DIAGNOSIS — M6281 Muscle weakness (generalized): Secondary | ICD-10-CM

## 2020-10-13 DIAGNOSIS — M25552 Pain in left hip: Secondary | ICD-10-CM

## 2020-10-13 DIAGNOSIS — G8929 Other chronic pain: Secondary | ICD-10-CM

## 2020-10-13 NOTE — Therapy (Addendum)
[facility] [address] [place], [name], [address] Phone: [phone]   Fax:  [phone]  Physical Therapy Treatment  Patient Details  Name: Wendy Woods MRN: [id number] Date of Birth: [birth year] Referring Provider (PT): Dr Clementeen Hoof Subjective Have had some pain this am about a 5.  Have done aquatic therapy in past.  Pain:5/10 left back aching intermittent chronic  Encounter Date: 10/13/2020     Past Medical History:  Diagnosis Date   DDD (degenerative disc disease), cervical 03/22/2016   DDD (degenerative disc disease), lumbar 03/22/2016   GERD (gastroesophageal reflux disease)    Hyperlipidemia    Hypertension    Rheumatoid arthritis (HCC)    Rheumatoid arthritis(714.0)    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     Pt seen for aquatic therapy today.  Treatment took place in water 3.25-4.8 ft in depth at the [facility] pool. Temp of water was 91.  Pt entered/exited the pool via stairs (step through pattern) independently with bilat rail.   Seated: stretching le gasroc, hamstrings adductors. Marching and Knee flex /ext x 10 reps added ankle buoy x 10 reps  Water walking with ankle cuff buoys x 6 length holding to sqoodle Standing add/abd cuing for core activation;  hip hinges x 10 reps.  Hip flex stretch static 2 x 30 secs bilat then strengthening pulling noodle facing wall cuing for tight core    Manual LB stretch facilitated by Therapist gentle overpressure. Using buoy cuffs and noodle knees to chest,  then strengthening abdominals and erector spinae knees to chest and jack knife x  10 reps therapist stabilizing.  Vertical suspension for decompression using squoodle, gentle rotation and knees to chest.    Pt requires buoyancy for support and to offload joints with strengthening exercises. Viscosity of the water is needed for resistance of strengthening; water current perturbations provides challenge to standing balance unsupported, requiring increased core activation.                             PT Short Term Goals - 10/06/20 2052       PT SHORT TERM GOAL #1   Title Patient will increase bilateral hip flexion to 105 degrees without significant pain    Time 3    Period Weeks    Status New    Target Date 10/27/20      PT SHORT TERM GOAL #2   Title Patient will increase bilateral hip stength to 4/5    Time 3    Period Weeks    Status New    Target Date 10/27/20      PT SHORT TERM GOAL #3   Title Patient will increase lumbar flexion to 60 degrees without increased pain    Time 3    Period Weeks    Status New    Target Date 10/27/20               PT Long Term Goals - 10/06/20 2057       PT LONG TERM GOAL #1   Title Patient will be indepdnent  with water program to promote strengthening without a signifcant increase in pain    Time 6    Period Weeks    Status New    Target Date 11/17/20      PT LONG TERM GOAL #2   Title Patient will exhibit improved hip strength grossly >/= 4/5 and knee strength grossly >/= 4+/5 MMT to improve ability to stand and walk longer periods    Time 6    Period Weeks    Status New    Target Date 11/17/20      PT LONG TERM GOAL #3   Title Patient will ambualte in the community witha reported 50 reduction in pain    Time 6    Period Weeks    Status New    Target Date 11/17/20                    Patient will benefit from skilled therapeutic intervention in order to improve the following deficits and impairments:  Abnormal gait, Decreased endurance, Decreased mobility, Increased  muscle spasms, Decreased range of motion, Decreased activity tolerance, Pain, Increased fascial restricitons, Decreased strength, Difficulty walking  Visit Diagnosis: Chronic bilateral low back pain, unspecified whether sciatica present  Pain in left hip  Chronic pain of right knee  Chronic pain of left knee  Muscle weakness (generalized)  Other abnormalities of gait and mobility  Other symptoms and signs involving the musculoskeletal system  Clinical Assessment Pt with minor apprehension in aquatic setting, guarded posture due to pain. Focus of treatment is pain relief through stretching and gentle strengthening. As pt relaxes in setting she reports some decrese in pain. Able to complete all exercises with cueing and demonstration. Upon completion of session pt with decreased ms tightness in LB and improved mobility with decreased pain.  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 [place], [name], [address] Phone: [phone]   Fax:  [phone]  Name: Tauriel Scronce MRN: [id number] Date of Birth: 02/25/1961  Addended Corrie Dandy Tomma Lightning) Anamae Rochelle MPT 11/09/20

## 2020-10-20 ENCOUNTER — Other Ambulatory Visit: Payer: Self-pay

## 2020-10-20 ENCOUNTER — Encounter (HOSPITAL_BASED_OUTPATIENT_CLINIC_OR_DEPARTMENT_OTHER): Payer: Self-pay | Admitting: Physical Therapy

## 2020-10-20 ENCOUNTER — Ambulatory Visit (HOSPITAL_BASED_OUTPATIENT_CLINIC_OR_DEPARTMENT_OTHER): Payer: Medicare Other | Admitting: Physical Therapy

## 2020-10-20 DIAGNOSIS — R2689 Other abnormalities of gait and mobility: Secondary | ICD-10-CM

## 2020-10-20 DIAGNOSIS — M25552 Pain in left hip: Secondary | ICD-10-CM

## 2020-10-20 DIAGNOSIS — M545 Low back pain, unspecified: Secondary | ICD-10-CM | POA: Diagnosis not present

## 2020-10-20 DIAGNOSIS — M6281 Muscle weakness (generalized): Secondary | ICD-10-CM

## 2020-10-20 DIAGNOSIS — G8929 Other chronic pain: Secondary | ICD-10-CM

## 2020-10-20 DIAGNOSIS — M25562 Pain in left knee: Secondary | ICD-10-CM

## 2020-10-20 NOTE — Therapy (Addendum)
[facility] [address] [place], [name], [address] Phone: [phone]   Fax:  [phone]  Physical Therapy Treatment  Patient Details  Name: Wendy Woods MRN: [id number] Date of Birth: Oct 18, 1960 Referring Provider (PT): Dr Clementeen Hoof   Encounter Date: 10/20/2020  Subjective I was soar in the right places after last session. Left hip seems better but right hip is giving me a little trouble.  Past Medical History:  Diagnosis Date   DDD (degenerative disc disease), cervical 03/22/2016   DDD (degenerative disc disease), lumbar 03/22/2016   GERD (gastroesophageal reflux disease)    Hyperlipidemia    Hypertension    Rheumatoid arthritis (HCC)    Rheumatoid arthritis(714.0)    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  Pt seen for aquatic therapy today.  Treatment took place in water 3.25-4.8 ft in depth at the [facility] pool. Temp of water was 91.  Pt entered/exited the pool via stairs step through pattern independently with bilat rail.    Seated: stretching le gasroc, hamstrings adductors. Marching and Knee flex /ext x 10 reps added ankle buoy x 10 reps. Core strengthening sitting on edge of bench, cuing for tight core, flutter kicking with ue extnded at 90 degrees. Superman shouder flex and ext using floats x 10 reps   Water walking with ankle cuff buoys x 6 lengths  Standing add/abd cuing for core activation; Hip flex stretch static 2 x 30 secs bilat then strengthening pulling noodle facing wall cuing for tight core   Manual LB stretch facilitated by Therapist gentle overpressure then pt able to  put pressure pulling knees indep. Using buoy cuffs and noodle knees to chest then opposite shoulder x 10 reps,  then strengthening abdominals and erector spinae knees to chest and jack knife x 5 reps therapist stabilizing.   Vertical suspension for decompression using squoodle, gentle rotation and knees to chest.                              PT Short Term Goals - 10/06/20 2052       PT SHORT TERM GOAL #1   Title Patient will increase bilateral hip flexion to 105 degrees without significant pain    Time 3    Period Weeks    Status New    Target Date 10/27/20      PT SHORT TERM GOAL #2   Title Patient will increase bilateral hip stength to 4/5    Time 3    Period Weeks    Status New    Target Date 10/27/20      PT SHORT TERM GOAL #3   Title Patient will increase lumbar flexion to 60 degrees without increased pain    Time 3    Period Weeks    Status New    Target Date 10/27/20               PT Long Term Goals - 10/06/20 2057       PT LONG TERM GOAL #1   Title Patient will be indepdnent with water program to  promote strengthening without a signifcant increase in pain    Time 6    Period Weeks    Status New    Target Date 11/17/20      PT LONG TERM GOAL #2   Title Patient will exhibit improved hip strength grossly >/= 4/5 and knee strength grossly >/= 4+/5 MMT to improve ability to stand and walk longer periods    Time 6    Period Weeks    Status New    Target Date 11/17/20      PT LONG TERM GOAL #3   Title Patient will ambualte in the community witha reported 50 reduction in pain    Time 6    Period Weeks    Status New    Target Date 11/17/20                    Patient will benefit from skilled therapeutic intervention in order to improve the following deficits and impairments:     Visit Diagnosis: Chronic bilateral low back pain, unspecified whether sciatica present  Pain in left hip  Chronic pain of right  knee  Chronic pain of left knee  Other abnormalities of gait and mobility  Muscle weakness (generalized)  Clinical Assessment No apprehension in setting. Pt with overall decrease in discomfort since last visit. Focus of treatment is core strengthening and stretching. Added UE shoulder ex in superman and core/aerobic capacity challenge with sitting and flutter kicking.   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 [place], [name], [address] Phone: [phone]   Fax:  [phone]  Name: Wendy Woods MRN: [id number] Date of Birth: 18-Jun-1960  Addended Wendy Dandy Tomma Lightning) Wendy Woods MPT 11/09/20

## 2020-10-22 ENCOUNTER — Other Ambulatory Visit: Payer: Self-pay | Admitting: Rheumatology

## 2020-10-22 MED ORDER — ONDANSETRON 4 MG PO TBDP: ORAL_TABLET | ORAL | 0 refills | Status: DC

## 2020-10-22 NOTE — Telephone Encounter (Signed)
Next Visit: 02/07/2021  Last Visit: 09/06/2020  Last Fill: 01/08/2020  Dx: Rheumatoid arthritis involving multiple sites with positive rheumatoid factor  Current Dose per office note on 09/06/2020: not discussed  Okay to refill Zofran?

## 2020-10-22 NOTE — Telephone Encounter (Signed)
Patient calling to request rx for Ondansetron 4mg  to be sent in to [facility] on [facility]. Per patient, Dr. [employer] gave her this rx last year for as needed. She has not needed a refill on it until now.

## 2020-10-27 ENCOUNTER — Encounter (HOSPITAL_BASED_OUTPATIENT_CLINIC_OR_DEPARTMENT_OTHER): Payer: Self-pay | Admitting: Physical Therapy

## 2020-10-27 ENCOUNTER — Ambulatory Visit (HOSPITAL_BASED_OUTPATIENT_CLINIC_OR_DEPARTMENT_OTHER): Payer: Medicare Other | Admitting: Physical Therapy

## 2020-10-27 ENCOUNTER — Other Ambulatory Visit: Payer: Self-pay

## 2020-10-27 DIAGNOSIS — G8929 Other chronic pain: Secondary | ICD-10-CM

## 2020-10-27 DIAGNOSIS — R29898 Other symptoms and signs involving the musculoskeletal system: Secondary | ICD-10-CM

## 2020-10-27 DIAGNOSIS — R2689 Other abnormalities of gait and mobility: Secondary | ICD-10-CM

## 2020-10-27 DIAGNOSIS — M545 Low back pain, unspecified: Secondary | ICD-10-CM

## 2020-10-27 DIAGNOSIS — M25552 Pain in left hip: Secondary | ICD-10-CM

## 2020-10-27 DIAGNOSIS — M6281 Muscle weakness (generalized): Secondary | ICD-10-CM

## 2020-10-27 DIAGNOSIS — M25561 Pain in right knee: Secondary | ICD-10-CM

## 2020-10-27 NOTE — Therapy (Signed)
[facility] [address] [place], [name], [address] Phone: [phone]   Fax:  [phone]  Physical Therapy Treatment  Patient Details  Name: Wendy Woods MRN: [id number] Date of Birth: [birth year] Referring Provider (PT): Dr Clementeen Hoof   Encounter Date: 10/27/2020   PT End of Session - 10/27/20 0908     Visit Number 5    Number of Visits 12    Date for PT Re-Evaluation 11/17/20    Authorization Type UHC medicare 10 visit progress note 15 visit Kx    PT Start Time 0815    PT Stop Time 0900    PT Time Calculation (min) 45 min    Equipment Utilized During Treatment Other (comment)    Activity Tolerance Patient tolerated treatment well    Behavior During Therapy [facility] for tasks assessed/performed             Past Medical History:  Diagnosis Date   DDD (degenerative disc disease), cervical 03/22/2016   DDD (degenerative disc disease), lumbar 03/22/2016   GERD (gastroesophageal reflux disease)    Hyperlipidemia    Hypertension    Rheumatoid arthritis (HCC)    Rheumatoid arthritis(714.0)    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   Subjective Assessment - 10/27/20 0859     Subjective My right hip and lb are hurting after working yesterday    Pertinent History [name]; DDD cervical; DDD lumbar; DMIIl;    Limitations Standing;Lifting    How long can you sit comfortably? not tolerated ofr long periods as I get stiff.    How long can you stand comfortably? undefined.  Pt reports discomfort but works through it.    How long can you walk comfortably? limtied walking dsitances    Diagnostic tests  X-rays: left Knee 2019 tricompartmental degeneration Lumbar x-ray: 2021: multilevel spondylosis and levo-scoliosis    Currently in Pain? Yes    Pain Score 7     Pain Location Back    Pain Orientation Left    Pain Descriptors / Indicators Aching    Pain Type Chronic pain    Pain Onset More than a month ago    Pain Frequency Intermittent    Aggravating Factors  walking and standing at work             Pt seen for aquatic therapy today.  Treatment took place in water 3.25-4.8 ft in depth at the [facility] pool. Temp of water was 91.  Pt entered/exited the pool via stairs step through pattern independently with bilat rail.    Seated: stretching le gasroc, hamstrings adductors internal and external rotators of hip and piriformis.  Suspend in prone Superman shouder flex and ext, knees to chest, and jack knife 2 x 10 reps   Water walking with ankle cuff buoys x 6 lengths  Standing add/abd cuing for core activation; Hip flex stretch static 2 x 30 secs bilat then strengthening pulling noodle facing wall cuing for tight core   Manual LB stretch facilitated by Therapist gentle overpressure then pt able to put pressure pulling knees indep. Using buoy cuffs and noodle knees to chest then  opposite shoulder x 10 reps,  then strengthening abdominals and erector spinae knees to chest and jack knife x 5 reps therapist stabilizing.   Vertical suspension for decompression using squoodle, gentle rotation and knees to chest.                              PT Short Term Goals - 10/06/20 2052       PT SHORT TERM GOAL #1   Title Patient will increase bilateral hip flexion to 105 degrees without significant pain    Time 3    Period Weeks    Status New    Target Date 10/27/20      PT SHORT TERM GOAL #2   Title Patient will increase bilateral hip stength to 4/5    Time 3    Period Weeks    Status New    Target Date 10/27/20      PT SHORT TERM GOAL #3   Title  Patient will increase lumbar flexion to 60 degrees without increased pain    Time 3    Period Weeks    Status New    Target Date 10/27/20               PT Long Term Goals - 10/06/20 2057       PT LONG TERM GOAL #1   Title Patient will be indepdnent with water program to promote strengthening without a signifcant increase in pain    Time 6    Period Weeks    Status New    Target Date 11/17/20      PT LONG TERM GOAL #2   Title Patient will exhibit improved hip strength grossly >/= 4/5 and knee strength grossly >/= 4+/5 MMT to improve ability to stand and walk longer periods    Time 6    Period Weeks    Status New    Target Date 11/17/20      PT LONG TERM GOAL #3   Title Patient will ambualte in the community witha reported 50 reduction in pain    Time 6    Period Weeks    Status New    Target Date 11/17/20                   Plan - 10/27/20 0909     Clinical Impression Statement Pt reporting some increased pain due to working.  Pt instructed on stretching prior to work .Focus on rom and stretching of right hip and LB. Pt demonstrates improvement in core strength as demonstrated by increased ability to maintain erect position through jack knife.    Personal Factors and Comorbidities Profession;Comorbidity 3+;Comorbidity 1;Comorbidity 2    Comorbidities RA, DMII, cervical DDD, lumbar DDD    Examination-Activity Limitations Sit;Squat;Stairs;Stand    Examination-Participation Restrictions Cleaning;Occupation;Yard Work;Community Activity;Shop    Clinical Decision Making Moderate    Rehab Potential Good    PT Frequency 2x / week    PT Duration 6 weeks    PT Treatment/Interventions ADLs/Self Care Home Management;Cryotherapy;Electrical Stimulation;Moist Heat;Ultrasound;DME Instruction;Gait training;Stair training;Functional mobility training;Therapeutic activities;Therapeutic exercise;Neuromuscular re-education;Patient/family education;Manual techniques;Passive range  of motion;Dry needling;Taping;Joint Manipulations    PT Next Visit Plan in the pool work on progressive weight bearing activity; Steps; squats; decompression exercises in the back noodle stabilzation exercises    PT Home Exercise Plan Access Code: [email or web address]  URL: [email or web address]  Date: 10/06/2020  Prepared by: Lorayne Bender    Exercises  Supine Lower Trunk Rotation - 1 x daily - 7 x weekly - 3 sets - 10 reps  Supine Piriformis Stretch with Foot on Ground - 1 x daily - 7 x weekly - 3 sets - 10 reps  Supine Quad Set - 1 x daily - 7 x weekly - 3 sets - 10 reps - 5 sec hold  Standing Glute Med Mobilization with Small Ball on Wall - 1 x daily - 7 x weekly - 3 sets - 10 reps - 1-2 min hold             Patient will benefit from skilled therapeutic intervention in order to improve the following deficits and impairments:  Abnormal gait, Decreased endurance, Decreased mobility, Increased muscle spasms, Decreased range of motion, Decreased activity tolerance, Pain, Increased fascial restricitons, Decreased strength, Difficulty walking  Visit Diagnosis: Chronic bilateral low back pain, unspecified whether sciatica present  Pain in left hip  Chronic pain of right knee  Other abnormalities of gait and mobility  Muscle weakness (generalized)  Other symptoms and signs involving the musculoskeletal system     Problem List Patient Active Problem List   Diagnosis Date Noted   Unilateral primary osteoarthritis, left knee 01/10/2018   Chronic pain of left knee 01/10/2018   DDD (degenerative disc disease), cervical 03/22/2016   DDD (degenerative disc disease), lumbar 03/22/2016   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   Angioedema 07/15/2014   Hypokalemia 07/15/2014   Diabetes mellitus, controlled (HCC) 07/03/2012   HTN (hypertension) 07/03/2012   Rheumatoid arthritis (HCC) 07/03/2012   Current chronic use of systemic steroids 07/03/2012   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 [place], [name], [address] Phone: [phone]   Fax:  [phone]  Name: Wendy Woods MRN: [id number] Date of Birth: 1960/05/10

## 2020-11-03 ENCOUNTER — Ambulatory Visit (HOSPITAL_BASED_OUTPATIENT_CLINIC_OR_DEPARTMENT_OTHER): Payer: Medicare Other | Attending: Rheumatology | Admitting: Physical Therapy

## 2020-11-03 ENCOUNTER — Other Ambulatory Visit: Payer: Self-pay

## 2020-11-03 ENCOUNTER — Encounter (HOSPITAL_BASED_OUTPATIENT_CLINIC_OR_DEPARTMENT_OTHER): Payer: Self-pay | Admitting: Physical Therapy

## 2020-11-03 DIAGNOSIS — R29898 Other symptoms and signs involving the musculoskeletal system: Secondary | ICD-10-CM | POA: Diagnosis present

## 2020-11-03 DIAGNOSIS — M25561 Pain in right knee: Secondary | ICD-10-CM | POA: Diagnosis present

## 2020-11-03 DIAGNOSIS — M25552 Pain in left hip: Secondary | ICD-10-CM | POA: Diagnosis present

## 2020-11-03 DIAGNOSIS — G8929 Other chronic pain: Secondary | ICD-10-CM | POA: Diagnosis present

## 2020-11-03 DIAGNOSIS — M545 Low back pain, unspecified: Secondary | ICD-10-CM | POA: Diagnosis not present

## 2020-11-03 DIAGNOSIS — R2689 Other abnormalities of gait and mobility: Secondary | ICD-10-CM | POA: Insufficient documentation

## 2020-11-03 DIAGNOSIS — M25562 Pain in left knee: Secondary | ICD-10-CM | POA: Insufficient documentation

## 2020-11-03 DIAGNOSIS — M6281 Muscle weakness (generalized): Secondary | ICD-10-CM

## 2020-11-03 NOTE — Therapy (Signed)
[facility] [address] [place], [name], [address] Phone: [phone]   Fax:  [phone]  Physical Therapy Treatment  Patient Details  Name: Wendy Woods MRN: [id number] Date of Birth: 1960/06/24 Referring Provider (PT): Dr Clementeen Hoof   Encounter Date: 11/03/2020   PT End of Session - 11/03/20 0829     Visit Number 6    Number of Visits 12    Date for PT Re-Evaluation 11/17/20    Authorization Type UHC medicare 10 visit progress note 15 visit Kx    PT Start Time 0825    PT Stop Time 0910    PT Time Calculation (min) 45 min    Equipment Utilized During Treatment Other (comment)    Activity Tolerance Patient tolerated treatment well             Past Medical History:  Diagnosis Date   DDD (degenerative disc disease), cervical 03/22/2016   DDD (degenerative disc disease), lumbar 03/22/2016   GERD (gastroesophageal reflux disease)    Hyperlipidemia    Hypertension    Rheumatoid arthritis (HCC)    Rheumatoid arthritis(714.0)    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   Subjective Assessment - 11/03/20 0825     Subjective My hip isn't bad today mostly my back. I am definately stronger and better since I started PT.    Limitations Lifting    How long can you sit comfortably? not tolerated ofr long periods as I get stiff.    Currently in Pain? Yes    Pain Score 5     Pain Location Back    Pain Orientation Left    Pain Descriptors / Indicators Aching    Pain Type Chronic pain    Pain Onset More than a month ago    Pain Frequency Intermittent    Pain Score 5    Pain Orientation Left    Pain  Descriptors / Indicators Aching    Pain Type Chronic pain    Pain Onset More than a month ago    Pain Frequency Constant    Pain Score 1    Pain Orientation Right    Pain Descriptors / Indicators Aching    Pain Type Chronic pain            Pt seen for aquatic therapy today.  Treatment took place in water 3.25-4.8 ft in depth at the [facility] pool. Temp of water was 91.  Pt entered/exited the pool via stairs step through pattern independently with bilat rail.    Seated: stretching le gasroc, hamstrings adductors internal and external rotators of hip and piriformis.   Suspend in prone Superman shouder flex and ext, knees to chest, and jack knife 2 x 10 reps   Water walking with ankle cuff buoys x 6 lengths  Standing add/abd cuing for core activation; Hip flex stretch static 2 x 30 secs bilat then strengthening pulling noodle facing wall cuing for tight core Noodle kick down 3 x 10 reps hip flex and external rotation progressing from holding to wall, bilat hand buoys then unilateral.  Cuing for tight core throughout   Manual  LB stretch facilitated by Therapist gentle overpressure then pt able to put pressure pulling knees indep. Using buoy cuffs and noodle knees to chest then opposite shoulder x 10 reps,  then strengthening abdominals and erector spinae knees to chest and jack knife x 5 reps therapist stabilizing.   Vertical suspension for decompression using squoodle, gentle rotation and knees to chest.                             PT Short Term Goals - 10/06/20 2052       PT SHORT TERM GOAL #1   Title Patient will increase bilateral hip flexion to 105 degrees without significant pain    Time 3    Period Weeks    Status New    Target Date 10/27/20      PT SHORT TERM GOAL #2   Title Patient will increase bilateral hip stength to 4/5    Time 3    Period Weeks    Status New    Target Date 10/27/20      PT SHORT TERM GOAL #3   Title  Patient will increase lumbar flexion to 60 degrees without increased pain    Time 3    Period Weeks    Status New    Target Date 10/27/20               PT Long Term Goals - 10/06/20 2057       PT LONG TERM GOAL #1   Title Patient will be indepdnent with water program to promote strengthening without a signifcant increase in pain    Time 6    Period Weeks    Status New    Target Date 11/17/20      PT LONG TERM GOAL #2   Title Patient will exhibit improved hip strength grossly >/= 4/5 and knee strength grossly >/= 4+/5 MMT to improve ability to stand and walk longer periods    Time 6    Period Weeks    Status New    Target Date 11/17/20      PT LONG TERM GOAL #3   Title Patient will ambualte in the community witha reported 50 reduction in pain    Time 6    Period Weeks    Status New    Target Date 11/17/20                   Plan - 11/03/20 8099     Clinical Impression Statement Focus of treatments is progressing strengthing and advancing balance challenges.  She is directed in maintaining core tightness wih all activites. Reports having good results with therapy being able to work with less pain.    Personal Factors and Comorbidities Profession;Comorbidity 3+;Comorbidity 1;Comorbidity 2    Comorbidities RA, DMII, cervical DDD, lumbar DDD    Stability/Clinical Decision Making Evolving/Moderate complexity    Clinical Decision Making Moderate    Rehab Potential Good    PT Frequency 2x / week    PT Duration 6 weeks    PT Treatment/Interventions ADLs/Self Care Home Management;Cryotherapy;Electrical Stimulation;Moist Heat;Ultrasound;DME Instruction;Gait training;Stair training;Functional mobility training;Therapeutic activities;Therapeutic exercise;Neuromuscular re-education;Patient/family education;Manual techniques;Passive range of motion;Dry needling;Taping;Joint Manipulations    PT Home Exercise Plan Access Code: [email or web address]  URL:  [email or web address]  Date: 10/06/2020  Prepared by: Lorayne Bender    Exercises  Supine Lower Trunk Rotation - 1 x daily - 7 x weekly - 3 sets - 10 reps  Supine Piriformis Stretch with Foot on Ground - 1 x daily - 7 x weekly - 3 sets - 10 reps  Supine Quad Set - 1 x daily - 7 x weekly - 3 sets - 10 reps - 5 sec hold  Standing Glute Med Mobilization with Small Ball on Wall - 1 x daily - 7 x weekly - 3 sets - 10 reps - 1-2 min hold             Patient will benefit from skilled therapeutic intervention in order to improve the following deficits and impairments:  Abnormal gait, Decreased endurance, Decreased mobility, Increased muscle spasms, Decreased range of motion, Decreased activity tolerance, Pain, Increased fascial restricitons, Decreased strength, Difficulty walking  Visit Diagnosis: Chronic bilateral low back pain, unspecified whether sciatica present  Pain in left hip  Chronic pain of right knee  Muscle weakness (generalized)  Other symptoms and signs involving the musculoskeletal system     Problem List Patient Active Problem List   Diagnosis Date Noted   Unilateral primary osteoarthritis, left knee 01/10/2018   Chronic pain of left knee 01/10/2018   DDD (degenerative disc disease), cervical 03/22/2016   DDD (degenerative disc disease), lumbar 03/22/2016   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   Angioedema 07/15/2014   Hypokalemia 07/15/2014   Diabetes mellitus, controlled (HCC) 07/03/2012   HTN (hypertension) 07/03/2012   Rheumatoid arthritis (HCC) 07/03/2012   Current chronic use of systemic steroids 07/03/2012   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

## 2020-11-08 ENCOUNTER — Encounter (HOSPITAL_COMMUNITY)
Admission: RE | Admit: 2020-11-08 | Discharge: 2020-11-08 | Disposition: A | Discharge status: Home / Self Care | Payer: Medicare Other | Source: Ambulatory Visit | Attending: Rheumatology | Admitting: Rheumatology

## 2020-11-08 DIAGNOSIS — M0579 Rheumatoid arthritis with rheumatoid factor of multiple sites without organ or systems involvement: Secondary | ICD-10-CM | POA: Diagnosis not present

## 2020-11-08 DIAGNOSIS — Z79899 Other long term (current) drug therapy: Secondary | ICD-10-CM | POA: Insufficient documentation

## 2020-11-08 LAB — COMPREHENSIVE METABOLIC PANEL
ALT: 23 U/L (ref 0–44)
AST: 20 U/L (ref 15–41)
Albumin: 3.6 g/dL (ref 3.5–5.0)
Alkaline Phosphatase: 56 U/L (ref 38–126)
Anion gap: 7 (ref 5–15)
BUN: 14 mg/dL (ref 6–20)
CO2: 28 mmol/L (ref 22–32)
Calcium: 9.5 mg/dL (ref 8.9–10.3)
Chloride: 100 mmol/L (ref 98–111)
Creatinine, Ser: 0.9 mg/dL (ref 0.44–1.00)
GFR, Estimated: >60 mL/min (ref >60)
Glucose, Bld: 199 mg/dL — ABNORMAL HIGH (ref 70–99)
Potassium: 3.7 mmol/L (ref 3.5–5.1)
Sodium: 135 mmol/L (ref 135–145)
Total Bilirubin: 0.7 mg/dL (ref 0.3–1.2)
Total Protein: 7.7 g/dL (ref 6.5–8.1)

## 2020-11-08 LAB — CBC WITH DIFFERENTIAL/PLATELET
Abs Immature Granulocytes: 0.03 10*3/uL (ref 0.00–0.07)
Basophils Absolute: 0.1 10*3/uL (ref 0.0–0.1)
Basophils Relative: 1 %
Eosinophils Absolute: 0.3 10*3/uL (ref 0.0–0.5)
Eosinophils Relative: 5 %
HCT: 36.2 % (ref 36.0–46.0)
Hemoglobin: 11.9 g/dL — ABNORMAL LOW (ref 12.0–15.0)
Immature Granulocytes: 0 %
Lymphocytes Relative: 41 %
Lymphs Abs: 2.8 10*3/uL (ref 0.7–4.0)
MCH: 29.9 pg (ref 26.0–34.0)
MCHC: 32.9 g/dL (ref 30.0–36.0)
MCV: 91 fL (ref 80.0–100.0)
Monocytes Absolute: 0.5 10*3/uL (ref 0.1–1.0)
Monocytes Relative: 7 %
Neutro Abs: 3.2 10*3/uL (ref 1.7–7.7)
Neutrophils Relative %: 46 %
Platelets: UNDETERMINED 10*3/uL (ref 150–400)
RBC: 3.98 MIL/uL (ref 3.87–5.11)
RDW: 13 % (ref 11.5–15.5)
WBC: 6.9 10*3/uL (ref 4.0–10.5)
nRBC: 0 % (ref 0.0–0.2)

## 2020-11-08 MED ORDER — SODIUM CHLORIDE 0.9 % IV SOLN
5.0000 mg/kg | INTRAVENOUS | Status: DC
  Administered 2020-11-08: 400 mg via INTRAVENOUS
  Filled 2020-11-08: qty 40

## 2020-11-08 MED ORDER — DIPHENHYDRAMINE HCL 25 MG PO CAPS
25.0000 mg | ORAL_CAPSULE | ORAL | Status: DC
Start: 2020-11-08 — End: 2020-11-09

## 2020-11-08 MED ORDER — ACETAMINOPHEN 325 MG PO TABS: 650.0000 mg | ORAL_TABLET | ORAL | Status: DC

## 2020-11-08 NOTE — Progress Notes (Signed)
Hemoglobin is borderline low-11.9.  Rest of CBC WNL.

## 2020-11-08 NOTE — Progress Notes (Signed)
Glucose is elevated-199.  Please notify the patient and advise her to monitor her blood sugar closely.  Rest of CMP WNL.

## 2020-11-10 ENCOUNTER — Encounter (HOSPITAL_BASED_OUTPATIENT_CLINIC_OR_DEPARTMENT_OTHER): Payer: Self-pay | Admitting: Physical Therapy

## 2020-11-10 ENCOUNTER — Other Ambulatory Visit: Payer: Self-pay

## 2020-11-10 ENCOUNTER — Ambulatory Visit (HOSPITAL_BASED_OUTPATIENT_CLINIC_OR_DEPARTMENT_OTHER): Payer: Medicare Other | Admitting: Physical Therapy

## 2020-11-10 DIAGNOSIS — R2689 Other abnormalities of gait and mobility: Secondary | ICD-10-CM

## 2020-11-10 DIAGNOSIS — M25552 Pain in left hip: Secondary | ICD-10-CM

## 2020-11-10 DIAGNOSIS — M6281 Muscle weakness (generalized): Secondary | ICD-10-CM

## 2020-11-10 DIAGNOSIS — M545 Low back pain, unspecified: Secondary | ICD-10-CM | POA: Diagnosis not present

## 2020-11-10 DIAGNOSIS — G8929 Other chronic pain: Secondary | ICD-10-CM

## 2020-11-10 DIAGNOSIS — R29898 Other symptoms and signs involving the musculoskeletal system: Secondary | ICD-10-CM

## 2020-11-10 DIAGNOSIS — M25562 Pain in left knee: Secondary | ICD-10-CM

## 2020-11-10 NOTE — Therapy (Signed)
[facility] [address] [place], [name], [address] Phone: [phone]   Fax:  [phone]  Physical Therapy Treatment  Patient Details  Name: Wendy Woods MRN: [id number] Date of Birth: [birth year] Referring Provider (PT): Dr Clementeen Hoof   Encounter Date: 11/10/2020   PT End of Session - 11/10/20 1130     Visit Number 7    Number of Visits 12    Date for PT Re-Evaluation 11/17/20    Authorization Type UHC medicare 10 visit progress note 15 visit Kx    PT Start Time 0835    PT Stop Time 0915    PT Time Calculation (min) 40 min    Equipment Utilized During Treatment Other (comment)    Activity Tolerance Patient tolerated treatment well    Behavior During Therapy [facility] for tasks assessed/performed             Past Medical History:  Diagnosis Date   DDD (degenerative disc disease), cervical 03/22/2016   DDD (degenerative disc disease), lumbar 03/22/2016   GERD (gastroesophageal reflux disease)    Hyperlipidemia    Hypertension    Rheumatoid arthritis (HCC)    Rheumatoid arthritis(714.0)    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   Subjective Assessment - 11/10/20 0823     Subjective My hip isn't bothering me today.  Infusion on Monday.  Back and knees a little.  Pain has not gone away but it is better.    Pertinent History [name]; DDD cervical; DDD lumbar; DMIIl;    Currently in Pain? Yes    Pain Score 5     Pain Location Back    Pain Orientation Left    Pain Descriptors / Indicators Aching    Pain Type Chronic pain    Pain Onset More than a month ago    Pain Score 5    Pain Location  Knee    Pain Orientation Left    Pain Descriptors / Indicators Aching    Pain Type Chronic pain    Pain Onset More than a month ago    Pain Frequency Constant    Pain Score 0             Pt seen for aquatic therapy today.  Treatment took place in water 3.25-4.8 ft in depth at the [facility] pool. Temp of water was 91.  Pt entered/exited the pool via stairs step through pattern independently with bilat rail.    Seated: stretching le gastroc, hamstrings adductors internal and external rotators of hip and piriformis.   Suspend in prone Superman shouder flex and ext, knees to chest, and jack knife 2 x 10 reps   Water walking with ankle cuff buoys x 6 lengths  Hip flex stretch static 2 x 30 secs bilat then strengthening pulling noodle facing wall cuing for tight core x 15 reps.  Pt instructed on tech to gain position indep. Given extra time pt able to complete Noodle kick down 2 x 10 reps hip flex and external rotation completing unsupported.  Progressed to squoodle bilat x 20 reps unsupported. Cuing for tight core throughout   Manual  LB stretch facilitated by Therapist gentle overpressure knees to chest then opposite shoulder 3 x 30 second stretch. Strengthening abdominals and erector spinae knees to chest and jack knife x 10 reps each therapist stabilizing.   Vertical suspension for decompression using squoodle, gentle rotation and knees to chest.  Testing completed TUG 23.66 30 second Noodle kick down: left 18 unsupported                                                 right 18.5 unsupported                           PT Short Term Goals - 11/10/20 1137       PT SHORT TERM GOAL #1   Status Achieved      PT SHORT TERM GOAL #2   Status Achieved               PT Long Term Goals - 10/06/20 2057       PT LONG TERM GOAL #1   Title Patient will be indepdnent with water program to promote strengthening without a signifcant increase in pain     Time 6    Period Weeks    Status New    Target Date 11/17/20      PT LONG TERM GOAL #2   Title Patient will exhibit improved hip strength grossly >/= 4/5 and knee strength grossly >/= 4+/5 MMT to improve ability to stand and walk longer periods    Time 6    Period Weeks    Status New    Target Date 11/17/20      PT LONG TERM GOAL #3   Title Patient will ambualte in the community witha reported 50 reduction in pain    Time 6    Period Weeks    Status New    Target Date 11/17/20                   Plan - 11/10/20 1134     Clinical Impression Statement Completed testing as noted in assessment.   Advancing balance challenges for core strengthening.  Pt decreasing need for ue support with kick downs. Was able to progress to using more buoyant squoodle over noodle with icreased challenge.  Verbal and tactile cuing for core activation throughout all activities.  Pt directed through techniques to gain positioning with indep use of noodle and technique of exercises assigned for home program when pt able to access a pool.MMT completed.  Bilat hip flex 4+/5. Hip flex bilaterally towards 110 degrees without discomfort.    Personal Factors and Comorbidities Profession;Comorbidity 3+;Comorbidity 1;Comorbidity 2    Comorbidities RA, DMII, cervical DDD, lumbar DDD    Examination-Activity Limitations Sit;Squat;Stairs;Stand    Examination-Participation Restrictions Cleaning;Occupation;Yard Work;Community Activity;Shop    Stability/Clinical Decision Making Evolving/Moderate complexity    Clinical Decision Making Moderate    Rehab Potential Good    PT Frequency 2x / week    PT Duration 6 weeks    PT Treatment/Interventions ADLs/Self Care Home Management;Cryotherapy;Electrical Stimulation;Moist Heat;Ultrasound;DME Instruction;Gait training;Stair training;Functional mobility training;Therapeutic activities;Therapeutic exercise;Neuromuscular re-education;Patient/family education;Manual  techniques;Passive range of motion;Dry needling;Taping;Joint Manipulations    PT Home Exercise Plan Access Code: [email or web address]  URL: [email or web address]  Date: 10/06/2020  Prepared by: Lorayne Bender    Exercises  Supine Lower Trunk  Rotation - 1 x daily - 7 x weekly - 3 sets - 10 reps  Supine Piriformis Stretch with Foot on Ground - 1 x daily - 7 x weekly - 3 sets - 10 reps  Supine Quad Set - 1 x daily - 7 x weekly - 3 sets - 10 reps - 5 sec hold  Standing Glute Med Mobilization with Small Ball on Wall - 1 x daily - 7 x weekly - 3 sets - 10 reps - 1-2 min hold             Patient will benefit from skilled therapeutic intervention in order to improve the following deficits and impairments:  Abnormal gait, Decreased endurance, Decreased mobility, Increased muscle spasms, Decreased range of motion, Decreased activity tolerance, Pain, Increased fascial restricitons, Decreased strength, Difficulty walking  Visit Diagnosis: Chronic bilateral low back pain, unspecified whether sciatica present  Pain in left hip  Chronic pain of right knee  Muscle weakness (generalized)  Chronic pain of left knee  Other abnormalities of gait and mobility  Other symptoms and signs involving the musculoskeletal system     Problem List Patient Active Problem List   Diagnosis Date Noted   Unilateral primary osteoarthritis, left knee 01/10/2018   Chronic pain of left knee 01/10/2018   DDD (degenerative disc disease), cervical 03/22/2016   DDD (degenerative disc disease), lumbar 03/22/2016   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   Angioedema 07/15/2014   Hypokalemia 07/15/2014   Diabetes mellitus, controlled (HCC) 07/03/2012   HTN (hypertension) 07/03/2012   Rheumatoid arthritis (HCC) 07/03/2012   Current chronic use of systemic steroids 07/03/2012   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 [place], [name], [address] Phone: [phone]   Fax:  [phone]  Name: Chelcee Korpi MRN: [id number] Date of Birth: 10/05/1960

## 2020-11-17 ENCOUNTER — Ambulatory Visit (HOSPITAL_BASED_OUTPATIENT_CLINIC_OR_DEPARTMENT_OTHER): Payer: Medicare Other | Admitting: Physical Therapy

## 2020-11-17 ENCOUNTER — Encounter (HOSPITAL_BASED_OUTPATIENT_CLINIC_OR_DEPARTMENT_OTHER): Payer: Self-pay | Admitting: Physical Therapy

## 2020-11-17 ENCOUNTER — Other Ambulatory Visit: Payer: Self-pay

## 2020-11-17 DIAGNOSIS — M25562 Pain in left knee: Secondary | ICD-10-CM

## 2020-11-17 DIAGNOSIS — M25561 Pain in right knee: Secondary | ICD-10-CM

## 2020-11-17 DIAGNOSIS — M6281 Muscle weakness (generalized): Secondary | ICD-10-CM

## 2020-11-17 DIAGNOSIS — G8929 Other chronic pain: Secondary | ICD-10-CM

## 2020-11-17 DIAGNOSIS — R2689 Other abnormalities of gait and mobility: Secondary | ICD-10-CM

## 2020-11-17 DIAGNOSIS — R29898 Other symptoms and signs involving the musculoskeletal system: Secondary | ICD-10-CM

## 2020-11-17 DIAGNOSIS — M545 Low back pain, unspecified: Secondary | ICD-10-CM

## 2020-11-17 DIAGNOSIS — M25552 Pain in left hip: Secondary | ICD-10-CM

## 2020-11-17 NOTE — Therapy (Signed)
[facility] [address] [place], [name], [address] Phone: [phone]   Fax:  [phone]  Physical Therapy Treatment  Patient Details  Name: Wendy Woods MRN: [id number] Date of Birth: [birth year] Referring Provider (PT): Dr Clementeen Hoof   Encounter Date: 11/17/2020   PT End of Session - 11/17/20 0829     Visit Number 8    Number of Visits 12    Date for PT Re-Evaluation 11/17/20    PT Start Time 0815    PT Stop Time 0900    PT Time Calculation (min) 45 min    Equipment Utilized During Treatment Other (comment)    Activity Tolerance Patient tolerated treatment well    Behavior During Therapy [facility] for tasks assessed/performed             Past Medical History:  Diagnosis Date   DDD (degenerative disc disease), cervical 03/22/2016   DDD (degenerative disc disease), lumbar 03/22/2016   GERD (gastroesophageal reflux disease)    Hyperlipidemia    Hypertension    Rheumatoid arthritis (HCC)    Rheumatoid arthritis(714.0)    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   Subjective Assessment - 11/17/20 0822     Subjective "will be on my feet for 2 double shifts this weekend.  Will probably hurt"    Currently in Pain? Yes    Pain Score 4     Pain Location Back    Pain Orientation Left    Pain Descriptors / Indicators Aching    Pain Frequency Intermittent    Pain Score 6    Pain Orientation Left    Pain Descriptors / Indicators Aching    Pain Onset More than a month ago    Pain Frequency Constant    Pain Score 0                                       PT Education -  11/17/20 0825     Education Details Using pain relievers/anti-inflammatorys before going to work to decrease discomfort.    Person(s) Educated Patient    Comprehension Verbalized understanding              PT Short Term Goals - 11/10/20 1137       PT SHORT TERM GOAL #1   Status Achieved      PT SHORT TERM GOAL #2   Status Achieved               PT Long Term Goals - 10/06/20 2057       PT LONG TERM GOAL #1   Title Patient will be indepdnent with water program to promote strengthening without a signifcant increase in pain    Time 6    Period Weeks    Status New    Target Date 11/17/20      PT LONG TERM GOAL #2   Title Patient will exhibit improved hip strength grossly >/= 4/5 and knee strength grossly >/= 4+/5 MMT to improve ability to stand and walk longer periods  Time 6    Period Weeks    Status New    Target Date 11/17/20      PT LONG TERM GOAL #3   Title Patient will ambualte in the community witha reported 50 reduction in pain    Time 6    Period Weeks    Status New    Target Date 11/17/20           Pt seen for aquatic therapy today.  Treatment took place in water 3.25-4.8 ft in depth at the [facility] pool. Temp of water was 91.  Pt entered/exited the pool via stairs step through pattern independently with bilat rail.    Seated: stretching le gastroc, hamstrings adductors internal and external rotators of hip and piriformis.   Suspend in prone Superman shouder flex and ext, knees to chest, and jack knife 2 x 10 reps. Stretching LB 2 x 20 sec.   Water walking with ankle cuff buoys x 6 lengths   Standing Hip flex stretch static 2 x 20 secs bilat then strengthening pulling noodle facing wall cuing for tight core 2 x 15 reps. Squoodle kick down bilat 2 x 20 reps unsupported. Cuing for tight core throughout Add/abd with ankle buoy cuff 2 x 15 Hip circles 2 x 15 Balance challenges standing on noodle decreasing BOS.   Pt requires  buoyancy for support and to offload joints with strengthening exercises. Viscosity of the water is needed for resistance of strengthening; water current perturbations provides challenge to standing balance unsupported, requiring increased core activation.         Plan - 11/17/20 1517     Clinical Impression Statement Focus on HEP to ensure proper tech and understanding. Pt instructed to pick up laminated copy of aquatic program at front desk during next session on land. She demonstartes improved core strength and balance as evidenced by completion of noodle kick downs without LOB, unsupported.  Overall pain decreased/controlled.    Personal Factors and Comorbidities Profession;Comorbidity 3+;Comorbidity 1;Comorbidity 2    Clinical Decision Making Moderate    Rehab Potential Good    PT Frequency 2x / week    PT Duration 6 weeks    PT Treatment/Interventions ADLs/Self Care Home Management;Cryotherapy;Electrical Stimulation;Moist Heat;Ultrasound;DME Instruction;Gait training;Stair training;Functional mobility training;Therapeutic activities;Therapeutic exercise;Neuromuscular re-education;Patient/family education;Manual techniques;Passive range of motion;Dry needling;Taping;Joint Manipulations             Patient will benefit from skilled therapeutic intervention in order to improve the following deficits and impairments:  Abnormal gait, Decreased endurance, Decreased mobility, Increased muscle spasms, Decreased range of motion, Decreased activity tolerance, Pain, Increased fascial restricitons, Decreased strength, Difficulty walking  Visit Diagnosis: Chronic bilateral low back pain, unspecified whether sciatica present  Other symptoms and signs involving the musculoskeletal system  Pain in left hip  Other abnormalities of gait and mobility  Chronic pain of right knee  Muscle weakness (generalized)  Chronic pain of left knee     Problem List Patient Active Problem List    Diagnosis Date Noted   Unilateral primary osteoarthritis, left knee 01/10/2018   Chronic pain of left knee 01/10/2018   DDD (degenerative disc disease), cervical 03/22/2016   DDD (degenerative disc disease), lumbar 03/22/2016   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   Angioedema 07/15/2014   Hypokalemia 07/15/2014   Diabetes mellitus, controlled (HCC) 07/03/2012   HTN (hypertension) 07/03/2012   Rheumatoid arthritis (HCC) 07/03/2012   Current chronic use of systemic steroids 07/03/2012   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 [place], [name], [address] Phone: [phone]   Fax:  [phone]  Name: Keandria Berrocal MRN: [id number] Date of Birth: 1961/03/05

## 2020-11-24 ENCOUNTER — Other Ambulatory Visit: Payer: Self-pay

## 2020-11-24 ENCOUNTER — Encounter (HOSPITAL_BASED_OUTPATIENT_CLINIC_OR_DEPARTMENT_OTHER): Payer: Self-pay | Admitting: Physical Therapy

## 2020-11-24 ENCOUNTER — Ambulatory Visit (HOSPITAL_BASED_OUTPATIENT_CLINIC_OR_DEPARTMENT_OTHER): Payer: Medicare Other | Admitting: Physical Therapy

## 2020-11-24 DIAGNOSIS — M25552 Pain in left hip: Secondary | ICD-10-CM

## 2020-11-24 DIAGNOSIS — G8929 Other chronic pain: Secondary | ICD-10-CM

## 2020-11-24 DIAGNOSIS — R2689 Other abnormalities of gait and mobility: Secondary | ICD-10-CM

## 2020-11-24 DIAGNOSIS — R29898 Other symptoms and signs involving the musculoskeletal system: Secondary | ICD-10-CM

## 2020-11-24 DIAGNOSIS — M545 Low back pain, unspecified: Secondary | ICD-10-CM

## 2020-11-24 DIAGNOSIS — M25562 Pain in left knee: Secondary | ICD-10-CM

## 2020-11-24 DIAGNOSIS — M6281 Muscle weakness (generalized): Secondary | ICD-10-CM

## 2020-11-24 NOTE — Therapy (Signed)
[place], [place], [address] Phone: [phone]   Fax:  [phone]  Physical Therapy Treatment/Discharge   Patient Details  Name: Wendy Woods MRN: [id number] Date of Birth: 08/28/1960 Referring Provider (PT): Dr Jaynie Collins   Encounter Date: 11/24/2020 Progress Note Reporting Period 10/06/2020 to 11/24/2020  See note below for Objective Data and Assessment of Progress/Goals.      PT End of Session - 11/24/20 1215     Visit Number 9    Number of Visits 12    Date for PT Re-Evaluation 11/24/20    Authorization Type [employer] medicare 10 visit progress note 15 visit Kx    PT Start Time 0800    PT Stop Time 0842    PT Time Calculation (min) 42 min    Activity Tolerance Patient tolerated treatment well    Behavior During Therapy [facility] for tasks assessed/performed             Past Medical History:  Diagnosis Date   DDD (degenerative disc disease), cervical 03/22/2016   DDD (degenerative disc disease), lumbar 03/22/2016   GERD (gastroesophageal reflux disease)    Hyperlipidemia    Hypertension    Rheumatoid arthritis ([place])    Rheumatoid arthritis(714.0)    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   Type II diabetes mellitus ([place])     Past Surgical History:  Procedure Laterality Date   APPENDECTOMY  1968   CATARACT EXTRACTION W/ INTRAOCULAR LENS  IMPLANT, BILATERAL Bilateral 11/2013-12/2013   KNEE ARTHROPLASTY     KNEE ARTHROSCOPY Left 1980's-1990's X 3   MYOMECTOMY  1990's    There were no vitals filed for this visit.   Subjective Assessment - 11/24/20 0805     Subjective Patient states that her pain is getting better but the weather is causing her pain.    Pertinent History [name]; DDD cervical; DDD lumbar; DMIIl;    Limitations Lifting    How long can you sit comfortably? not tolerated ofr long periods as I get stiff.    How long can you stand comfortably? undefined.  Pt reports discomfort but  works through it.    How long can you walk comfortably? limtied walking dsitances    Diagnostic tests X-rays: left Knee 2019 tricompartmental degeneration Lumbar x-ray: 2021: multilevel spondylosis and levo-scoliosis    Patient Stated Goals to have less pain    Currently in Pain? Yes    Pain Score 4     Pain Location Back    Pain Orientation Left    Pain Descriptors / Indicators Aching    Pain Type Chronic pain    Pain Onset More than a month ago    Pain Frequency Intermittent    Aggravating Factors  walking and standing at work    Pain Relieving Factors movement    Multiple Pain Sites Yes    Pain Score 7    Pain Location Knee    Pain Orientation Left    Pain Descriptors / Indicators Aching    Pain Type Chronic pain    Pain Onset More than a month ago    Pain Frequency Constant    Aggravating Factors  sittig for too long then standing    Pain Relieving Factors movement    Effect of Pain on Daily Activities difficulty performing daily tasks                [facility] PT Assessment - 11/24/20 0001       AROM  Lumbar Flexion 70    Lumbar Extension pain at end range    Lumbar - Right Side Bend painful toward right    Lumbar - Left Side Bend Not as painful as right side    Lumbar - Right Rotation painful at end range on the left    Lumbar - Left Rotation painful at end range range, equal to right side      PROM   Right Hip Flexion 98    Left Hip Flexion 100      Strength   Right Hip Flexion 4-/5    Right Hip ABduction 5/5    Right Hip ADduction 5/5    Left Hip Flexion 3+/5    Left Hip ABduction 4+/5    Left Hip ADduction 5/5                           [facility] Adult PT Treatment/Exercise - 11/24/20 0001       Exercises   Exercises Lumbar      Lumbar Exercises: Stretches   Active Hamstring Stretch Left;Right;10 seconds;1 rep    Active Hamstring Stretch Limitations seated    Passive Hamstring Stretch Right;Left;2 reps;10 seconds    Lower Trunk  Rotation Limitations x20    Piriformis Stretch Limitations 3x20 sec hold      Lumbar Exercises: Standing   Other Standing Lumbar Exercises marching 2x15 with no band.    Other Standing Lumbar Exercises diaphramatic breathing 2x5, with abdominal squeeze      Lumbar Exercises: Seated   Other Seated Lumbar Exercises clam shells 2x10, red band.                    PT Education - 11/24/20 0815     Education Details reviewed final HEP and progression of activity going forward    Person(s) Educated Patient    Methods Explanation;Demonstration;Tactile cues;Verbal cues    Comprehension Verbalized understanding;Returned demonstration;Verbal cues required;Tactile cues required              PT Short Term Goals - 11/10/20 1137       PT SHORT TERM GOAL #1   Status Achieved      PT SHORT TERM GOAL #2   Status Achieved               PT Long Term Goals - 10/06/20 2057       PT LONG TERM GOAL #1   Title Patient will be indepdnent with water program to promote strengthening without a signifcant increase in pain    Time 6    Period Weeks    Status New    Target Date 11/17/20      PT LONG TERM GOAL #2   Title Patient will exhibit improved hip strength grossly >/= 4/5 and knee strength grossly >/= 4+/5 MMT to improve ability to stand and walk longer periods    Time 6    Period Weeks    Status New    Target Date 11/17/20      PT LONG TERM GOAL #3   Title Patient will ambualte in the community witha reported 50 reduction in pain    Time 6    Period Weeks    Status New    Target Date 11/17/20                   Plan - 11/24/20 9675     Clinical Impression Statement Patient shows improved  lower extremity strenght. Patient has improved strength in bilateral hips. Patient is able to tolerated greater ranges of motion in lumbar movement. Patient's pain overall has decreased. Patient tolerated well with understanding of land HEP and aquatic HEP. Patient was  recommended to continue HEP at home to maintain strength gained in therapy. Overall, patients strength, stability, and function has improved from therapy. She has reached all goals for therapy. See below for goal specific progress. D/C to HEP.    Personal Factors and Comorbidities Profession;Comorbidity 3+;Comorbidity 1;Comorbidity 2    Comorbidities RA, DMII, cervical DDD, lumbar DDD    Examination-Activity Limitations Sit;Squat;Stairs;Stand    Examination-Participation Restrictions Cleaning;Occupation;Yard Work;Community Activity;Shop    Stability/Clinical Decision Making Evolving/Moderate complexity    Clinical Decision Making Moderate    Rehab Potential Good    PT Frequency 2x / week    PT Duration 6 weeks    PT Treatment/Interventions ADLs/Self Care Home Management;Cryotherapy;Electrical Stimulation;Moist Heat;Ultrasound;DME Instruction;Gait training;Stair training;Functional mobility training;Therapeutic activities;Therapeutic exercise;Neuromuscular re-education;Patient/family education;Manual techniques;Passive range of motion;Dry needling;Taping;Joint Manipulations    PT Next Visit Plan in the pool work on progressive weight bearing activity; Steps; squats; decompression exercises in the back noodle stabilzation exercises    PT Home Exercise Plan Access Code: [email or web address]  URL: [email or web address]  Date: 10/06/2020  Prepared by: Carolyne Littles    Exercises  Supine Lower Trunk Rotation - 1 x daily - 7 x weekly - 3 sets - 10 reps  Supine Piriformis Stretch with Foot on Ground - 1 x daily - 7 x weekly - 3 sets - 10 reps  Supine Quad Set - 1 x daily - 7 x weekly - 3 sets - 10 reps - 5 sec hold  Standing Glute Med Mobilization with Small Ball on Wall - 1 x daily - 7 x weekly - 3 sets - 10 reps - 1-2 min hold    Consulted and Agree with Plan of Care Patient             Patient will benefit from skilled therapeutic intervention in order to improve the following deficits and  impairments:  Abnormal gait, Decreased endurance, Decreased mobility, Increased muscle spasms, Decreased range of motion, Decreased activity tolerance, Pain, Increased fascial restricitons, Decreased strength, Difficulty walking  Visit Diagnosis: Chronic bilateral low back pain, unspecified whether sciatica present  Other symptoms and signs involving the musculoskeletal system  Pain in left hip  Other abnormalities of gait and mobility  Chronic pain of right knee  Muscle weakness (generalized)  Chronic pain of left knee     Problem List Patient Active Problem List   Diagnosis Date Noted   Unilateral primary osteoarthritis, left knee 01/10/2018   Chronic pain of left knee 01/10/2018   DDD (degenerative disc disease), cervical 03/22/2016   DDD (degenerative disc disease), lumbar 03/22/2016   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   Angioedema 07/15/2014   Hypokalemia 07/15/2014   Diabetes mellitus, controlled ([place]) 07/03/2012   HTN (hypertension) 07/03/2012   Rheumatoid arthritis ([place]) 07/03/2012   Current chronic use of systemic steroids 07/03/2012   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 Patient goals were met. Patient is being discharged due  to meeting the stated rehab goals.   [facility] [address] [place], [place], [address] Phone: [phone]   Fax:  [phone]  Name: Wendy Woods MRN: [id number] Date of Birth: 1960/09/09

## 2020-12-06 ENCOUNTER — Encounter (HOSPITAL_BASED_OUTPATIENT_CLINIC_OR_DEPARTMENT_OTHER): Payer: Self-pay | Admitting: *Deleted

## 2020-12-06 ENCOUNTER — Emergency Department (HOSPITAL_BASED_OUTPATIENT_CLINIC_OR_DEPARTMENT_OTHER)
Admission: EM | Admit: 2020-12-06 | Discharge: 2020-12-06 | Disposition: A | Discharge status: Home / Self Care | Payer: Medicare Other | Attending: Emergency Medicine | Admitting: Emergency Medicine

## 2020-12-06 ENCOUNTER — Other Ambulatory Visit: Payer: Self-pay

## 2020-12-06 ENCOUNTER — Emergency Department (HOSPITAL_BASED_OUTPATIENT_CLINIC_OR_DEPARTMENT_OTHER): Payer: Medicare Other | Admitting: Radiology

## 2020-12-06 DIAGNOSIS — Z79899 Other long term (current) drug therapy: Secondary | ICD-10-CM | POA: Diagnosis not present

## 2020-12-06 DIAGNOSIS — E119 Type 2 diabetes mellitus without complications: Secondary | ICD-10-CM | POA: Diagnosis not present

## 2020-12-06 DIAGNOSIS — I10 Essential (primary) hypertension: Secondary | ICD-10-CM | POA: Insufficient documentation

## 2020-12-06 DIAGNOSIS — Z91040 Latex allergy status: Secondary | ICD-10-CM | POA: Insufficient documentation

## 2020-12-06 DIAGNOSIS — M545 Low back pain, unspecified: Secondary | ICD-10-CM | POA: Insufficient documentation

## 2020-12-06 DIAGNOSIS — Y92410 Unspecified street and highway as the place of occurrence of the external cause: Secondary | ICD-10-CM | POA: Insufficient documentation

## 2020-12-06 DIAGNOSIS — M62830 Muscle spasm of back: Secondary | ICD-10-CM

## 2020-12-06 DIAGNOSIS — Z7984 Long term (current) use of oral hypoglycemic drugs: Secondary | ICD-10-CM | POA: Diagnosis not present

## 2020-12-06 DIAGNOSIS — R202 Paresthesia of skin: Secondary | ICD-10-CM | POA: Insufficient documentation

## 2020-12-06 MED ORDER — ACETAMINOPHEN 325 MG PO TABS
650.0000 mg | ORAL_TABLET | Freq: Once | ORAL | Status: AC
  Administered 2020-12-06: 650 mg via ORAL
  Filled 2020-12-06: qty 2

## 2020-12-06 MED ORDER — CYCLOBENZAPRINE HCL 10 MG PO TABS: 10.0000 mg | ORAL_TABLET | Freq: Two times a day (BID) | ORAL | 0 refills | Status: AC | PRN

## 2020-12-06 MED ORDER — DEXAMETHASONE 6 MG PO TABS
10.0000 mg | ORAL_TABLET | Freq: Once | ORAL | Status: AC
  Administered 2020-12-06: 10 mg via ORAL
  Filled 2020-12-06: qty 1
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
## 2020-12-06 NOTE — ED Notes (Signed)
ED Provider at bedside. 

## 2020-12-06 NOTE — Discharge Instructions (Addendum)
X-ray shows no fracture.  Recommend 1000 mg of Tylenol every 6 hours as needed for pain.  Take muscle relaxant Flexeril up to twice a day but do not drive or do any heavy machinery use with this medicine as it is mildly sedating.  Follow-up with your primary care doctor if pain is persisting as she may need some physical therapy.
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
## 2020-12-08 ENCOUNTER — Other Ambulatory Visit: Payer: Self-pay | Admitting: Internal Medicine

## 2020-12-08 DIAGNOSIS — E2839 Other primary ovarian failure: Secondary | ICD-10-CM

## 2020-12-10 ENCOUNTER — Telehealth: Payer: Self-pay

## 2020-12-10 NOTE — Telephone Encounter (Signed)
Patient advised she has seen Dr. Cleophas Dunker in the past.  Patient advised to try to schedule an urgent evaluation with Dr. Cleophas Dunker.  He will be able to obtain x-rays and manage an acute fracture if she has one. Patient expressed understanding.

## 2020-12-10 NOTE — Telephone Encounter (Signed)
Attempted to contact the patient and left message for patient to call the office.  

## 2020-12-10 NOTE — Telephone Encounter (Signed)
She has seen Dr. Cleophas Dunker in the past.  Please advise the patient to try to schedule an urgent evaluation with Dr. Cleophas Dunker.  He will be able to obtain x-rays and manage an acute fracture if she has one.
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
## 2020-12-13 ENCOUNTER — Ambulatory Visit (HOSPITAL_COMMUNITY)
Admission: RE | Admit: 2020-12-13 | Discharge: 2020-12-13 | Disposition: A | Discharge status: Home / Self Care | Payer: Medicare Other | Source: Ambulatory Visit | Attending: Rheumatology | Admitting: Rheumatology

## 2020-12-13 ENCOUNTER — Other Ambulatory Visit: Payer: Self-pay

## 2020-12-13 DIAGNOSIS — M0579 Rheumatoid arthritis with rheumatoid factor of multiple sites without organ or systems involvement: Secondary | ICD-10-CM | POA: Diagnosis not present

## 2020-12-13 MED ORDER — DIPHENHYDRAMINE HCL 25 MG PO CAPS: 25.0000 mg | ORAL_CAPSULE | ORAL | Status: DC

## 2020-12-13 MED ORDER — ACETAMINOPHEN 325 MG PO TABS: 650.0000 mg | ORAL_TABLET | ORAL | Status: DC

## 2020-12-13 MED ORDER — SODIUM CHLORIDE 0.9 % IV SOLN
5.0000 mg/kg | INTRAVENOUS | Status: DC
  Administered 2020-12-13: 400 mg via INTRAVENOUS
  Filled 2020-12-13: qty 40

## 2020-12-14 ENCOUNTER — Other Ambulatory Visit: Payer: Self-pay | Admitting: Internal Medicine

## 2020-12-14 DIAGNOSIS — Z1231 Encounter for screening mammogram for malignant neoplasm of breast: Secondary | ICD-10-CM

## 2020-12-15 ENCOUNTER — Ambulatory Visit: Payer: Self-pay

## 2020-12-15 ENCOUNTER — Encounter: Payer: Self-pay | Admitting: Orthopaedic Surgery

## 2020-12-15 ENCOUNTER — Ambulatory Visit (INDEPENDENT_AMBULATORY_CARE_PROVIDER_SITE_OTHER): Payer: Medicare Other | Admitting: Orthopaedic Surgery

## 2020-12-15 ENCOUNTER — Other Ambulatory Visit: Payer: Self-pay

## 2020-12-15 DIAGNOSIS — M5030 Other cervical disc degeneration, unspecified cervical region: Secondary | ICD-10-CM | POA: Diagnosis not present

## 2020-12-15 DIAGNOSIS — M542 Cervicalgia: Secondary | ICD-10-CM | POA: Diagnosis not present

## 2020-12-15 DIAGNOSIS — M545 Low back pain, unspecified: Secondary | ICD-10-CM

## 2020-12-15 DIAGNOSIS — M5136 Other intervertebral disc degeneration, lumbar region: Secondary | ICD-10-CM

## 2020-12-15 DIAGNOSIS — M79672 Pain in left foot: Secondary | ICD-10-CM
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
## 2020-12-17 ENCOUNTER — Other Ambulatory Visit: Payer: Self-pay | Admitting: Pharmacist

## 2020-12-17 DIAGNOSIS — M0579 Rheumatoid arthritis with rheumatoid factor of multiple sites without organ or systems involvement: Secondary | ICD-10-CM

## 2020-12-17 DIAGNOSIS — Z79899 Other long term (current) drug therapy: Secondary | ICD-10-CM

## 2020-12-17 NOTE — Progress Notes (Signed)
Next infusion scheduled for Remicade on 01/17/21 and due for updated orders. Diagnosis: rheumatoid arthirits  Dose: 5 mg/kg every 5 weeks (400mg  based on last recorded weight of 82.9kg)  Last Clinic Visit: 09/06/20 Next Clinic Visit: 02/07/21  Last infusion: 12/13/20 Labs: CBC and CMP on 11/08/20 - hemoglobin borderline low but rest of CBC wnl, glucose was elevated but sample was not fasting. Rest of CMP WNL TB Gold: negative on 03/03/20   Orders placed for Remicade 5mg /kg x 3 doses along with premedication of Tylenol and Benadryl to be administered 30 minutes before medication infusion.  Standing CBC with diff/platelet and CMP with GFR orders placed to be drawn every 2 months.  Next TB gold due November 2022 (placed order to be drawn with November 2022 infusion)  December 2022, PharmD, MPH Clinical Pharmacist (Rheumatology and Pulmonology)

## 2020-12-27 ENCOUNTER — Ambulatory Visit: Payer: Medicare Other | Attending: Orthopaedic Surgery

## 2020-12-27 ENCOUNTER — Other Ambulatory Visit: Payer: Self-pay

## 2020-12-27 DIAGNOSIS — M542 Cervicalgia: Secondary | ICD-10-CM | POA: Diagnosis present

## 2020-12-27 DIAGNOSIS — M545 Low back pain, unspecified: Secondary | ICD-10-CM | POA: Insufficient documentation

## 2020-12-27 DIAGNOSIS — M6281 Muscle weakness (generalized): Secondary | ICD-10-CM | POA: Insufficient documentation

## 2020-12-27 DIAGNOSIS — R252 Cramp and spasm: Secondary | ICD-10-CM | POA: Insufficient documentation

## 2020-12-27 DIAGNOSIS — R2689 Other abnormalities of gait and mobility: Secondary | ICD-10-CM | POA: Insufficient documentation

## 2020-12-27 DIAGNOSIS — G8929 Other chronic pain: Secondary | ICD-10-CM | POA: Insufficient documentation
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
## 2020-12-27 NOTE — Patient Instructions (Signed)
Access Code: [email or web address] URL: [email or web address] Date: 12/27/2020 Prepared by: Tresa Endo  Exercises Supine Lower Trunk Rotation - 3 x daily - 7 x weekly - 1 sets - 3 reps - 20-30 hold Hooklying Single Knee to Chest Stretch - 3 x daily - 7 x weekly - 1 sets - 3 reps - 20-30 hold Seated Hamstring Stretch - 3 x daily - 7 x weekly - 1 sets - 3 reps - 20-30 hold Seated Cervical Flexion AROM - 3 x daily - 7 x weekly - 1 sets - 3 reps - 20 hold Seated Cervical Sidebending AROM - 3 x daily - 7 x weekly - 1 sets - 3 reps - 20 hold Seated Cervical Rotation AROM - 3 x daily - 7 x weekly - 1 sets - 3 reps - 20 hold Seated Correct Posture - 1 x daily - 7 x weekly - 3 sets - 10 reps  Trigger Point Dry Needling  What is Trigger Point Dry Needling (DN)? DN is a physical therapy technique used to treat muscle pain and dysfunction. Specifically, DN helps deactivate muscle trigger points (muscle knots).  A thin filiform needle is used to penetrate the skin and stimulate the underlying trigger point. The goal is for a local twitch response (LTR) to occur and for the trigger point to relax. No medication of any kind is injected during the procedure.   What Does Trigger Point Dry Needling Feel Like?  The procedure feels different for each individual patient. Some patients report that they do not actually feel the needle enter the skin and overall the process is not painful. Very mild bleeding may occur. However, many patients feel a deep cramping in the muscle in which the needle was inserted. This is the local twitch response.   How Will I feel after the treatment? Soreness is normal, and the onset of soreness may not occur for a few hours. Typically this soreness does not last longer than two days.  Bruising is uncommon, however; ice can be used to decrease any possible bruising.  In rare cases feeling tired or nauseous after the treatment is normal. In addition, your symptoms may get worse before  they get better, this period will typically not last longer than 24 hours.   What Can I do After My Treatment? Increase your hydration by drinking more water for the next 24 hours. You may place ice or heat on the areas treated that have become sore, however, do not use heat on inflamed or bruised areas. Heat often brings more relief post needling. You can continue your regular activities, but vigorous activity is not recommended initially after the treatment for 24 hours. DN is best combined with other physical therapy such as strengthening, stretching, and other therapies.    [facility] [address] [place], [name] [address] Phone # [phone] Fax [phone]

## 2020-12-30 ENCOUNTER — Other Ambulatory Visit: Payer: Self-pay

## 2020-12-30 ENCOUNTER — Ambulatory Visit
Admission: RE | Admit: 2020-12-30 | Discharge: 2020-12-30 | Disposition: A | Discharge status: Home / Self Care | Payer: Medicare Other | Source: Ambulatory Visit | Attending: Internal Medicine | Admitting: Internal Medicine

## 2020-12-30 DIAGNOSIS — Z1231 Encounter for screening mammogram for malignant neoplasm of breast: Secondary | ICD-10-CM

## 2021-01-05 ENCOUNTER — Ambulatory Visit: Payer: Medicare Other | Attending: Orthopaedic Surgery

## 2021-01-05 ENCOUNTER — Other Ambulatory Visit: Payer: Self-pay

## 2021-01-05 ENCOUNTER — Ambulatory Visit (HOSPITAL_BASED_OUTPATIENT_CLINIC_OR_DEPARTMENT_OTHER): Payer: Medicare Other | Attending: Orthopaedic Surgery | Admitting: Physical Therapy

## 2021-01-05 ENCOUNTER — Encounter (HOSPITAL_BASED_OUTPATIENT_CLINIC_OR_DEPARTMENT_OTHER): Payer: Self-pay | Admitting: Physical Therapy

## 2021-01-05 DIAGNOSIS — M542 Cervicalgia: Secondary | ICD-10-CM | POA: Diagnosis present

## 2021-01-05 DIAGNOSIS — M25561 Pain in right knee: Secondary | ICD-10-CM | POA: Diagnosis present

## 2021-01-05 DIAGNOSIS — M6281 Muscle weakness (generalized): Secondary | ICD-10-CM | POA: Insufficient documentation

## 2021-01-05 DIAGNOSIS — R2689 Other abnormalities of gait and mobility: Secondary | ICD-10-CM | POA: Diagnosis not present

## 2021-01-05 DIAGNOSIS — M25552 Pain in left hip: Secondary | ICD-10-CM | POA: Insufficient documentation

## 2021-01-05 DIAGNOSIS — M545 Low back pain, unspecified: Secondary | ICD-10-CM | POA: Insufficient documentation

## 2021-01-05 DIAGNOSIS — R252 Cramp and spasm: Secondary | ICD-10-CM | POA: Insufficient documentation

## 2021-01-05 DIAGNOSIS — G8929 Other chronic pain: Secondary | ICD-10-CM | POA: Insufficient documentation

## 2021-01-05 DIAGNOSIS — R29898 Other symptoms and signs involving the musculoskeletal system: Secondary | ICD-10-CM | POA: Insufficient documentation

## 2021-01-05 DIAGNOSIS — M25562 Pain in left knee: Secondary | ICD-10-CM | POA: Diagnosis present

## 2021-01-05 NOTE — Therapy (Signed)
[facility] [address], [address] [place], [name], [address] Phone: [phone]   Fax:  [phone]  Physical Therapy Treatment  Patient Details  Name: Wendy Woods MRN: [id number] Date of Birth: [birth year] Referring Provider (PT): Norlene Campbell, MD   Encounter Date: 01/05/2021   PT End of Session - 01/05/21 0924     Visit Number 2    Date for PT Re-Evaluation 02/21/21    Authorization Type UHC medicare 10 visit progress note    Progress Note Due on Visit 10    PT Start Time 0847    PT Stop Time 0926    PT Time Calculation (min) 39 min    Activity Tolerance Patient tolerated treatment well    Behavior During Therapy [facility] for tasks assessed/performed             Past Medical History:  Diagnosis Date   DDD (degenerative disc disease), cervical 03/22/2016   DDD (degenerative disc disease), lumbar 03/22/2016   GERD (gastroesophageal reflux disease)    Hyperlipidemia    Hypertension    Rheumatoid arthritis (HCC)    Rheumatoid arthritis(714.0)    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   Subjective Assessment - 01/05/21 0850     Subjective I am not feeling good today, I think it is the weather.    Currently in Pain? Yes    Pain Score 8     Pain Location Back    Pain Orientation Lower;Left;Right    Pain Descriptors / Indicators Burning;Aching;Sore    Pain Type Acute pain    Pain Onset 1 to 4 weeks ago    Pain Frequency Constant    Aggravating Factors  walking and standing    Pain Relieving Factors pain medication, heat/ice    Pain Score 8    Pain Location Neck    Pain Orientation Right;Left                 [facility] PT Assessment - 01/05/21 0001       ROM / Strength   AROM / PROM / Strength AROM      AROM   Overall AROM  Deficits    AROM Assessment Site Cervical    Cervical - Right Side Bend 35    Cervical - Left Side Bend 35    Cervical - Right Rotation 35    Cervical - Left Rotation 25                           [facility] Adult PT Treatment/Exercise - 01/05/21 0001       Exercises   Exercises Neck      Neck Exercises: Seated   Other Seated Exercise cervical 3 ways: 20 hold x2 each      Lumbar Exercises: Stretches   Active Hamstring Stretch 2 reps;30 seconds;Left;Right    Active Hamstring Stretch Limitations seated    Passive Hamstring Stretch Right;Left;2 reps;30 seconds    Lower Trunk Rotation Limitations 3x20 seconds      Lumbar Exercises: Aerobic   Nustep Level 1x 6 min- PT present to discuss  progress      Lumbar Exercises: Seated   Other Seated Lumbar Exercises ball roll outs x 5      Lumbar Exercises: Supine   Ab Set 10 reps;5 seconds    AB Set Limitations with ball squeeze      Lumbar Exercises: Sidelying   Other Sidelying Lumbar Exercises open book x10 bil                  Upper Extremity Functional Index Score :   /80     PT Short Term Goals - 12/27/20 0905       PT SHORT TERM GOAL #1   Title be independent in initial HEP    Time 4    Period Weeks    Status New    Target Date 01/24/21      PT SHORT TERM GOAL #2   Title report a 30% reduction in the frequency and intensity of headaches    Baseline daily headaches    Time 4    Period Weeks    Status New    Target Date 01/24/21      PT SHORT TERM GOAL #3   Title report a 30% reduction in the frequency and intensity of neck and low back pain to improve function at home and at work    Time 4    Period Weeks    Status New    Target Date 01/24/21               PT Long Term Goals - 12/27/20 0906       PT LONG TERM GOAL #1   Title Patient will  be indepdnent with water program and land based HEP to promote strengthening without a signifcant increase in pain    Baseline --    Time 8    Period Weeks    Status New    Target Date 02/21/21      PT LONG TERM GOAL #2   Title improve FOTO to > or = to 53    Baseline 31    Time 8    Period Weeks    Status New    Target Date 02/21/21      PT LONG TERM GOAL #3   Title report > or = to 60% reduction in LBP and neck pain with home and work tasks    Baseline --    Time 8    Period Weeks    Status New    Target Date 02/21/21      PT LONG TERM GOAL #4   Title verbalize and demonstrate body mechanics and postural modificaitons for neck and lumbar protection with home and work tasks    Baseline --    Time 8    Period Weeks    Status New    Target Date 02/21/21      PT LONG TERM GOAL #5   Title demonstrate bil neck rotation to > or = to 65 degrees to improve safety with driving    Baseline limited by 75%    Time 8    Period Weeks    Status New    Target Date 02/21/21      Additional Long Term Goals   Additional Long Term Goals Yes      PT LONG TERM GOAL #6   Title Pt will increase LE strength to overall to > or = to 4+/5 when assessed in sitting to improve functional mobility    Baseline --  Time 8    Period Weeks    Status New    Target Date 02/21/21      PT LONG TERM GOAL #7   Title report > or = to 70% reduction in the frequency and intensity of headaches    Time 8    Period Weeks    Status New    Target Date 02/21/21                   Plan - 01/05/21 0908     Clinical Impression Statement First time follow-up after evaluation.  Pt reports 8/10 in neck and low back today and feels this might be related to the weather.  Pt did well with gentle mobility today and was able to participate without increased pain.  Movement is slow and guarded and PT encouraged pt to move gently.  Pt with improved ease of movement and improved cervical A/ROM today.  Verbal  and tactile cues provided to prevent substitution and guarding.  Pt will continue to benefit from skilled PT for advancement of mobility, strength, flexibility and pain management.    PT Frequency 2x / week    PT Duration 8 weeks    PT Treatment/Interventions ADLs/Self Care Home Management;Cryotherapy;Electrical Stimulation;Moist Heat;Ultrasound;DME Instruction;Gait training;Stair training;Functional mobility training;Therapeutic activities;Therapeutic exercise;Neuromuscular re-education;Patient/family education;Manual techniques;Passive range of motion;Dry needling;Taping;Joint Manipulations    PT Next Visit Plan Aquatics, land based mobility, DN when pain level allows    PT Home Exercise Plan Access Code: [email or web address]    Recommended Other Services initial cert is signed             Patient will benefit from skilled therapeutic intervention in order to improve the following deficits and impairments:  Abnormal gait, Decreased endurance, Decreased mobility, Increased muscle spasms, Decreased range of motion, Decreased activity tolerance, Pain, Increased fascial restricitons, Decreased strength, Difficulty walking, Impaired flexibility  Visit Diagnosis: Other abnormalities of gait and mobility  Chronic bilateral low back pain, unspecified whether sciatica present  Muscle weakness (generalized)  Cervicalgia  Cramp and spasm     Problem List Patient Active Problem List   Diagnosis Date Noted   Pain in left foot 12/15/2020   Unilateral primary osteoarthritis, left knee 01/10/2018   Chronic pain of left knee 01/10/2018   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   Angioedema 07/15/2014   Hypokalemia 07/15/2014   Diabetes mellitus, controlled (HCC) 07/03/2012   HTN (hypertension) 07/03/2012   Rheumatoid arthritis (HCC) 07/03/2012   Current chronic use of systemic steroids 07/03/2012   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 [place], [name], [address] Phone: [phone]   Fax:  [phone]  Name: Wendy Woods MRN: [id number] Date of Birth: 09/01/1960

## 2021-01-06 NOTE — Therapy (Signed)
[facility] [address] [place], [name], [address] Phone: [phone]   Fax:  [phone]  Physical Therapy Treatment  Patient Details  Name: Wendy Woods MRN: [id number] Date of Birth: 1961-03-26 Referring Provider (PT): Norlene Campbell, MD   Encounter Date: 01/05/2021   PT End of Session - 01/05/21 1919     Visit Number 3    Number of Visits 12    Date for PT Re-Evaluation 02/21/21    PT Start Time 1130    PT Stop Time 1215    PT Time Calculation (min) 45 min    Equipment Utilized During Treatment Other (comment)    Activity Tolerance Patient tolerated treatment well;Patient limited by pain             Past Medical History:  Diagnosis Date   DDD (degenerative disc disease), cervical 03/22/2016   DDD (degenerative disc disease), lumbar 03/22/2016   GERD (gastroesophageal reflux disease)    Hyperlipidemia    Hypertension    Rheumatoid arthritis (HCC)    Rheumatoid arthritis(714.0)    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   Subjective Assessment - 01/05/21 1918     Subjective 'I thought my appointment was later today.  Shoulders, neck and LB are hurting"                                        PT Education - 01/05/21 1918     Education Details Return to cerv sine and shoulder exercises. [email or web address]    Person(s) Educated Patient    Methods Explanation    Comprehension Verbalized understanding              PT Short Term Goals - 12/27/20 0905       PT SHORT TERM GOAL #1   Title be independent in initial HEP    Time 4    Period Weeks    Status New     Target Date 01/24/21      PT SHORT TERM GOAL #2   Title report a 30% reduction in the frequency and intensity of headaches    Baseline daily headaches    Time 4    Period Weeks    Status New    Target Date 01/24/21      PT SHORT TERM GOAL #3   Title report a 30% reduction in the frequency and intensity of neck and low back pain to improve function at home and at work    Time 4    Period Weeks    Status New    Target Date 01/24/21               PT Long Term Goals - 12/27/20 0906       PT LONG TERM GOAL #1   Title Patient will be indepdnent with water program and land based HEP to promote strengthening without a signifcant increase in pain    Baseline --    Time 8    Period Weeks    Status New  Target Date 02/21/21      PT LONG TERM GOAL #2   Title improve FOTO to > or = to 53    Baseline 31    Time 8    Period Weeks    Status New    Target Date 02/21/21      PT LONG TERM GOAL #3   Title report > or = to 60% reduction in LBP and neck pain with home and work tasks    Baseline --    Time 8    Period Weeks    Status New    Target Date 02/21/21      PT LONG TERM GOAL #4   Title verbalize and demonstrate body mechanics and postural modificaitons for neck and lumbar protection with home and work tasks    Baseline --    Time 8    Period Weeks    Status New    Target Date 02/21/21      PT LONG TERM GOAL #5   Title demonstrate bil neck rotation to > or = to 65 degrees to improve safety with driving    Baseline limited by 75%    Time 8    Period Weeks    Status New    Target Date 02/21/21      Additional Long Term Goals   Additional Long Term Goals Yes      PT LONG TERM GOAL #6   Title Pt will increase LE strength to overall to > or = to 4+/5 when assessed in sitting to improve functional mobility    Baseline --    Time 8    Period Weeks    Status New    Target Date 02/21/21      PT LONG TERM GOAL #7   Title report > or = to 70% reduction in the  frequency and intensity of headaches    Time 8    Period Weeks    Status New    Target Date 02/21/21           Pt seen for aquatic therapy today.  Treatment took place in water 3.25-4.8 ft in depth at the [facility] pool. Temp of water was 91.  Pt entered/exited the pool via stairs step to pattern independently with bilat rail.  Warm up: forward, backward and side stepping/walking cues for increased step length, increased speed, hand placement to increase resistance.  Seated Stretching: gastroc, hamstrings, and glutes 3  30 sec hold  Bad Ragaz, Pt with lumbar belt around hips and nek doodle for neck support.  .  Pt assisted into supine floating position by lying head on shoulder of PT to get into floating position. . PT at torso and assisting with trunk left to right and vice versa to engage trunk muscles. PT then rotated trunk in order to engage abdomnal (internal and external obliques)  Emphasis on breathing techniques to draw in abdominals for support.  Pt then utilizing posterior chain and engaging Hip extension and knee flexion with water resistance while PT used Aquastretch techniques to decrease muscle tension in  left low back.  Deep pressure to trigger points, suboccipitals, massage to upper traps.  Standing Using 1 foam hand buoys; -horizontal abduction and add x 10 then buoys submerged x 10 -add/abd x 10 -shoulder flex x 10 Cueing for eccentric slow return and posture. Cervical spine ROM x 5 reps.  Pt requires buoyancy for support and to offload joints with strengthening exercises. Viscosity of the  water is needed for resistance of strengthening; water current perturbations provides challenge to standing balance unsupported, requiring increased core activation.         Plan - 01/06/21 1338     Clinical Impression Statement Return to aquatic therapy. Pt reporting high pain, visable tightness in traps, palpated tightness and discomfort in sub occipitals.  Pt  placed in supine suspension completed modified Bad Ragaz. Able to manipulate/massage upper traps/deep pressure to sub occipitals releasing some tightness and gaining some decrease in discomfort. Pt re-instructed on posture, shoulder positioning. Shoulder and cervical spine stretching submerged to 90%    PT Treatment/Interventions ADLs/Self Care Home Management;Cryotherapy;Electrical Stimulation;Moist Heat;Ultrasound;DME Instruction;Gait training;Stair training;Functional mobility training;Therapeutic activities;Therapeutic exercise;Neuromuscular re-education;Patient/family education;Manual techniques;Passive range of motion;Dry needling;Taping;Joint Manipulations    PT Next Visit Plan Bad Ragaz    PT Home Exercise Plan Access Code: [email or web address]             Patient will benefit from skilled therapeutic intervention in order to improve the following deficits and impairments:  Abnormal gait, Decreased endurance, Decreased mobility, Increased muscle spasms, Decreased range of motion, Decreased activity tolerance, Pain, Increased fascial restricitons, Decreased strength, Difficulty walking, Impaired flexibility  Visit Diagnosis: Chronic bilateral low back pain, unspecified whether sciatica present  Other symptoms and signs involving the musculoskeletal system  Other abnormalities of gait and mobility  Pain in left hip  Chronic pain of right knee  Muscle weakness (generalized)  Cervicalgia  Cramp and spasm  Chronic pain of left knee     Problem List Patient Active Problem List   Diagnosis Date Noted   Pain in left foot 12/15/2020   Unilateral primary osteoarthritis, left knee 01/10/2018   Chronic pain of left knee 01/10/2018   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   Angioedema 07/15/2014   Hypokalemia 07/15/2014   Diabetes mellitus, controlled (HCC) 07/03/2012   HTN (hypertension) 07/03/2012   Rheumatoid arthritis (HCC) 07/03/2012   Current chronic use of systemic steroids 07/03/2012    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 [place], [name], [address] Phone: [phone]   Fax:  [phone]  Name: LARAYA PESTKA MRN: [id number] Date of Birth: [birth year]

## 2021-01-12 ENCOUNTER — Encounter: Payer: Self-pay | Admitting: Orthopaedic Surgery

## 2021-01-12 ENCOUNTER — Ambulatory Visit: Payer: Medicare Other

## 2021-01-12 ENCOUNTER — Ambulatory Visit (HOSPITAL_BASED_OUTPATIENT_CLINIC_OR_DEPARTMENT_OTHER): Payer: Medicare Other | Admitting: Physical Therapy

## 2021-01-12 ENCOUNTER — Ambulatory Visit (INDEPENDENT_AMBULATORY_CARE_PROVIDER_SITE_OTHER): Payer: Medicare Other | Admitting: Orthopaedic Surgery

## 2021-01-12 ENCOUNTER — Other Ambulatory Visit: Payer: Self-pay

## 2021-01-12 DIAGNOSIS — M25552 Pain in left hip: Secondary | ICD-10-CM

## 2021-01-12 DIAGNOSIS — M545 Low back pain, unspecified: Secondary | ICD-10-CM

## 2021-01-12 DIAGNOSIS — M79672 Pain in left foot: Secondary | ICD-10-CM | POA: Diagnosis not present

## 2021-01-12 DIAGNOSIS — R2689 Other abnormalities of gait and mobility: Secondary | ICD-10-CM

## 2021-01-12 DIAGNOSIS — R29898 Other symptoms and signs involving the musculoskeletal system: Secondary | ICD-10-CM

## 2021-01-12 DIAGNOSIS — M47812 Spondylosis without myelopathy or radiculopathy, cervical region: Secondary | ICD-10-CM | POA: Diagnosis not present

## 2021-01-12 DIAGNOSIS — M25561 Pain in right knee: Secondary | ICD-10-CM

## 2021-01-12 DIAGNOSIS — M6281 Muscle weakness (generalized): Secondary | ICD-10-CM

## 2021-01-12 DIAGNOSIS — G8929 Other chronic pain: Secondary | ICD-10-CM

## 2021-01-12 DIAGNOSIS — R252 Cramp and spasm: Secondary | ICD-10-CM

## 2021-01-12 DIAGNOSIS — M25562 Pain in left knee: Secondary | ICD-10-CM

## 2021-01-12 DIAGNOSIS — M5442 Lumbago with sciatica, left side: Secondary | ICD-10-CM | POA: Diagnosis not present

## 2021-01-12 DIAGNOSIS — M542 Cervicalgia: Secondary | ICD-10-CM
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
## 2021-01-12 NOTE — Therapy (Signed)
[facility] [address] [place], [name], [address] Phone: [phone]   Fax:  [phone]  Physical Therapy Treatment  Patient Details  Name: Wendy Woods MRN: [id number] Date of Birth: 1960-09-20 Referring Provider (PT): Norlene Campbell, MD   Encounter Date: 01/12/2021   PT End of Session - 01/12/21 1520     Visit Number 5    Date for PT Re-Evaluation 02/21/21    Authorization Type [employer] medicare 10 visit progress note    Progress Note Due on Visit 10    PT Start Time 1445    PT Stop Time 1531    PT Time Calculation (min) 46 min    Activity Tolerance Patient tolerated treatment well    Behavior During Therapy [facility] for tasks assessed/performed             Past Medical History:  Diagnosis Date   DDD (degenerative disc disease), cervical 03/22/2016   DDD (degenerative disc disease), lumbar 03/22/2016   GERD (gastroesophageal reflux disease)    Hyperlipidemia    Hypertension    Rheumatoid arthritis (HCC)    Rheumatoid arthritis(714.0)    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   Subjective Assessment - 01/12/21 1445     Subjective I was already at the pool today.  I'm not feeling much change since I started PT.                               [facility] Adult PT Treatment/Exercise - 01/12/21 0001       Modalities   Modalities Electrical Stimulation;Moist Heat      Moist Heat Therapy   Number Minutes Moist Heat 15 Minutes    Moist Heat Location Lumbar Spine      Electrical Stimulation   Electrical Stimulation Location low back    Electrical Stimulation Action IFC     Electrical Stimulation Parameters 15 minutes    Electrical Stimulation Goals Pain      Manual Therapy   Manual Therapy Soft tissue mobilization;Myofascial release    Manual therapy comments skilled palpation and assessment during DN    Soft tissue mobilization elongation to Lt>Rt lumbar and gluteals .              Trigger Point Dry Needling - 01/12/21 0001     Consent Given? Yes    Education Handout Provided Yes    Muscles Treated Back/Hip Gluteus minimus;Gluteus medius;Piriformis;Lumbar multifidi    Gluteus Minimus Response Twitch response elicited;Palpable increased muscle length    Gluteus Medius Response Twitch response elicited;Palpable increased muscle length    Piriformis Response Twitch response elicited;Palpable increased muscle length    Lumbar multifidi Response Twitch response elicited;Palpable increased muscle length                   PT Education - 01/12/21 1449     Education Details DN info    Person(s) Educated Patient    Methods Explanation;Demonstration;Handout    Comprehension Verbalized understanding;Returned demonstration              PT  Short Term Goals - 12/27/20 0905       PT SHORT TERM GOAL #1   Title be independent in initial HEP    Time 4    Period Weeks    Status New    Target Date 01/24/21      PT SHORT TERM GOAL #2   Title report a 30% reduction in the frequency and intensity of headaches    Baseline daily headaches    Time 4    Period Weeks    Status New    Target Date 01/24/21      PT SHORT TERM GOAL #3   Title report a 30% reduction in the frequency and intensity of neck and low back pain to improve function at home and at work    Time 4    Period Weeks    Status New    Target Date 01/24/21               PT Long Term Goals - 12/27/20 0906       PT LONG TERM GOAL #1   Title Patient will be indepdnent with water program and land based HEP to promote strengthening without a signifcant increase in pain     Baseline --    Time 8    Period Weeks    Status New    Target Date 02/21/21      PT LONG TERM GOAL #2   Title improve FOTO to > or = to 53    Baseline 31    Time 8    Period Weeks    Status New    Target Date 02/21/21      PT LONG TERM GOAL #3   Title report > or = to 60% reduction in LBP and neck pain with home and work tasks    Baseline --    Time 8    Period Weeks    Status New    Target Date 02/21/21      PT LONG TERM GOAL #4   Title verbalize and demonstrate body mechanics and postural modificaitons for neck and lumbar protection with home and work tasks    Baseline --    Time 8    Period Weeks    Status New    Target Date 02/21/21      PT LONG TERM GOAL #5   Title demonstrate bil neck rotation to > or = to 65 degrees to improve safety with driving    Baseline limited by 75%    Time 8    Period Weeks    Status New    Target Date 02/21/21      Additional Long Term Goals   Additional Long Term Goals Yes      PT LONG TERM GOAL #6   Title Pt will increase LE strength to overall to > or = to 4+/5 when assessed in sitting to improve functional mobility    Baseline --    Time 8    Period Weeks    Status New    Target Date 02/21/21      PT LONG TERM GOAL #7   Title report > or = to 70% reduction in the frequency and intensity of headaches    Time 8    Period Weeks    Status New    Target Date 02/21/21                   Plan - 01/12/21 1518  Clinical Impression Statement Pt arrived with 7-8/10 LBP and was fatigued after having aquatic PT today.  Pt has been doing her gentle flexibility exercises and reports that she has not seen any changes in her symptoms since the start of care.  Session focused on dry needling and manual therapy to lumbar spine and gluteals to address tissue mobility.  Pt with reduced sensitivity to palpation and manual work vs evaluation today.  Pt with reduced pain and improved tissue mobility s/p treatment with good twitch  response to DN.  PT provided info regarding home TENs for pain management.  Pt will continue to benefit from skilled PT to address LBP and limited mobility.    Rehab Potential Good    PT Frequency 2x / week    PT Duration 8 weeks    PT Treatment/Interventions ADLs/Self Care Home Management;Cryotherapy;Electrical Stimulation;Moist Heat;Ultrasound;DME Instruction;Gait training;Stair training;Functional mobility training;Therapeutic activities;Therapeutic exercise;Neuromuscular re-education;Patient/family education;Manual techniques;Passive range of motion;Dry needling;Taping;Joint Manipulations;Aquatic Therapy    PT Next Visit Plan aquatics and progression of mobility out of the water.  Assess response to DN and electrical stimulation    PT Home Exercise Plan Access Code: [email or web address]    Consulted and Agree with Plan of Care Patient             Patient will benefit from skilled therapeutic intervention in order to improve the following deficits and impairments:  Abnormal gait, Decreased endurance, Decreased mobility, Increased muscle spasms, Decreased range of motion, Decreased activity tolerance, Pain, Increased fascial restricitons, Decreased strength, Difficulty walking, Impaired flexibility  Visit Diagnosis: Chronic pain of right knee  Pain in left hip  Other symptoms and signs involving the musculoskeletal system  Other abnormalities of gait and mobility  Chronic bilateral low back pain, unspecified whether sciatica present     Problem List Patient Active Problem List   Diagnosis Date Noted   Low back pain 01/12/2021   Osteoarthritis cervical spine 01/12/2021   Pain in left foot 12/15/2020   Unilateral primary osteoarthritis, left knee 01/10/2018   Chronic pain of left knee 01/10/2018   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   Angioedema 07/15/2014   Hypokalemia 07/15/2014   Diabetes mellitus, controlled (HCC) 07/03/2012   HTN (hypertension) 07/03/2012   Rheumatoid arthritis (HCC) 07/03/2012    Current chronic use of systemic steroids 07/03/2012   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 [place], [name], [address] Phone: [phone]   Fax:  [phone]  Name: Wendy Woods MRN: [id number] Date of Birth: 20-Nov-1960

## 2021-01-12 NOTE — Therapy (Signed)
[facility] [address] [place], [name], [address] Phone: [phone]   Fax:  [phone]  Physical Therapy Treatment  Patient Details  Name: Wendy Woods MRN: [id number] Date of Birth: [birth year] Referring Provider (PT): Norlene Campbell, MD   Encounter Date: 01/12/2021   PT End of Session - 01/12/21 1116     Visit Number 4    Number of Visits 12    Date for PT Re-Evaluation 02/21/21    PT Start Time 1115    PT Stop Time 1200    PT Time Calculation (min) 45 min    Equipment Utilized During Treatment Other (comment)    Activity Tolerance Patient tolerated treatment well;Patient limited by pain             Past Medical History:  Diagnosis Date   DDD (degenerative disc disease), cervical 03/22/2016   DDD (degenerative disc disease), lumbar 03/22/2016   GERD (gastroesophageal reflux disease)    Hyperlipidemia    Hypertension    Rheumatoid arthritis (HCC)    Rheumatoid arthritis(714.0)    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   Subjective Assessment - 01/12/21 1214     Subjective "I saw the doctor about my left foot, it has been hurting since the accident"    Currently in Pain? Yes    Pain Score 8     Pain Orientation Left;Right;Lower    Pain Descriptors / Indicators Aching;Sore    Pain Type Acute pain    Pain Score 8    Pain Orientation Right;Left    Pain Descriptors / Indicators Aching    Pain Type Acute pain    Pain Onset 1 to 4 weeks ago    Pain Score 8    Pain Location Foot    Pain Orientation Left    Pain Descriptors / Indicators Aching;Burning    Pain Type Acute pain    Pain Onset 1  to 4 weeks ago                                        PT Education - 01/12/21 1216     Education Details anatomy and biomechanics of foot    Person(s) Educated Patient    Methods Explanation    Comprehension Verbalized understanding              PT Short Term Goals - 12/27/20 0905       PT SHORT TERM GOAL #1   Title be independent in initial HEP    Time 4    Period Weeks    Status New    Target Date 01/24/21      PT SHORT TERM GOAL #2   Title report a 30% reduction in the frequency and intensity of headaches    Baseline daily headaches    Time 4    Period Weeks    Status New    Target Date 01/24/21      PT SHORT TERM GOAL #3   Title report a 30% reduction in the frequency and  intensity of neck and low back pain to improve function at home and at work    Time 4    Period Weeks    Status New    Target Date 01/24/21               PT Long Term Goals - 12/27/20 0906       PT LONG TERM GOAL #1   Title Patient will be indepdnent with water program and land based HEP to promote strengthening without a signifcant increase in pain    Baseline --    Time 8    Period Weeks    Status New    Target Date 02/21/21      PT LONG TERM GOAL #2   Title improve FOTO to > or = to 53    Baseline 31    Time 8    Period Weeks    Status New    Target Date 02/21/21      PT LONG TERM GOAL #3   Title report > or = to 60% reduction in LBP and neck pain with home and work tasks    Baseline --    Time 8    Period Weeks    Status New    Target Date 02/21/21      PT LONG TERM GOAL #4   Title verbalize and demonstrate body mechanics and postural modificaitons for neck and lumbar protection with home and work tasks    Baseline --    Time 8    Period Weeks    Status New    Target Date 02/21/21      PT LONG TERM GOAL #5   Title demonstrate bil neck rotation to > or = to 65 degrees to improve safety with driving    Baseline limited by 75%     Time 8    Period Weeks    Status New    Target Date 02/21/21      Additional Long Term Goals   Additional Long Term Goals Yes      PT LONG TERM GOAL #6   Title Pt will increase LE strength to overall to > or = to 4+/5 when assessed in sitting to improve functional mobility    Baseline --    Time 8    Period Weeks    Status New    Target Date 02/21/21      PT LONG TERM GOAL #7   Title report > or = to 70% reduction in the frequency and intensity of headaches    Time 8    Period Weeks    Status New    Target Date 02/21/21            Pt seen for aquatic therapy today.  Treatment took place in water 3.25-4.8 ft in depth at the [facility] pool. Temp of water was 94.  Pt entered/exited the pool via stairs step to pattern independently with bilat rail.   Warm up: forward, backward and side stepping/walking cues for increased step length, increased speed, hand placement to increase resistance.   Standing Runners Stretch: gastroc, hamstrings, and glutes 3 x 30 sec hold  Left foot manual mobilization and stretching 3,4,5th rays   Bad Ragaz, positioning: Pt with lumbar belt around hips and nek doodle for neck support.  .  Pt assisted into supine floating position by lying head on shoulder of PT to get into floating position. . PT at torso and assisting with trunk  left to right and vice versa to engage trunk muscles. PT then rotated trunk in order to engage abdomnal (internal and external obliques).  Pt then utilizing posterior chain and engaging Hip extension and knee flexion with water resistance using ankle buoys x 10 reps while PT used Aquastretch techniques to decrease muscle tension in cervical spine and upper traps as well as manual/ Deep pressure to trigger points, suboccipitals and left upper trap.    Cervical spine ROM x 5 reps.   Pt requires buoyancy for support and to offload joints with strengthening exercises. Viscosity of the water is needed for resistance  of strengthening; water current perturbations provides challenge to standing balance unsupported, requiring increased core activation.        Plan - 01/12/21 1217     Clinical Impression Statement Pt with 8/10 presenting today all areas.  By end of session pt reports 0-1/10.  Focus on joint mobs of left foot, deep massage and core/glute strength.  She responds well. Pt has been able to return to her part time job where she sits but not her standing job.  Did report improvement on symptoms for a few days after last aquatic session.  Suboccipitals tender to touch but only minimal tightness.    PT Treatment/Interventions ADLs/Self Care Home Management;Cryotherapy;Electrical Stimulation;Moist Heat;Ultrasound;DME Instruction;Gait training;Stair training;Functional mobility training;Therapeutic activities;Therapeutic exercise;Neuromuscular re-education;Patient/family education;Manual techniques;Passive range of motion;Dry needling;Taping;Joint Manipulations;Aquatic Therapy    PT Next Visit Plan How is foot    PT Home Exercise Plan Access Code: [email or web address]             Patient will benefit from skilled therapeutic intervention in order to improve the following deficits and impairments:  Abnormal gait, Decreased endurance, Decreased mobility, Increased muscle spasms, Decreased range of motion, Decreased activity tolerance, Pain, Increased fascial restricitons, Decreased strength, Difficulty walking, Impaired flexibility  Visit Diagnosis: Chronic pain of right knee  Pain in left hip  Other symptoms and signs involving the musculoskeletal system  Other abnormalities of gait and mobility  Chronic bilateral low back pain, unspecified whether sciatica present  Muscle weakness (generalized)  Cervicalgia  Cramp and spasm  Chronic pain of left knee     Problem List Patient Active Problem List   Diagnosis Date Noted   Low back pain 01/12/2021   Osteoarthritis cervical spine 01/12/2021    Pain in left foot 12/15/2020   Unilateral primary osteoarthritis, left knee 01/10/2018   Chronic pain of left knee 01/10/2018   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   Angioedema 07/15/2014   Hypokalemia 07/15/2014   Diabetes mellitus, controlled (HCC) 07/03/2012   HTN (hypertension) 07/03/2012   Rheumatoid arthritis (HCC) 07/03/2012   Current chronic use of systemic steroids 07/03/2012   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

## 2021-01-12 NOTE — Patient Instructions (Signed)

## 2021-01-17 ENCOUNTER — Encounter (HOSPITAL_COMMUNITY): Payer: Medicare Other

## 2021-01-19 ENCOUNTER — Ambulatory Visit: Payer: Medicare Other

## 2021-01-19 ENCOUNTER — Ambulatory Visit (HOSPITAL_BASED_OUTPATIENT_CLINIC_OR_DEPARTMENT_OTHER): Payer: Medicare Other | Admitting: Physical Therapy

## 2021-01-19 ENCOUNTER — Other Ambulatory Visit: Payer: Self-pay

## 2021-01-19 DIAGNOSIS — G8929 Other chronic pain: Secondary | ICD-10-CM

## 2021-01-19 DIAGNOSIS — R2689 Other abnormalities of gait and mobility: Secondary | ICD-10-CM

## 2021-01-19 DIAGNOSIS — R29898 Other symptoms and signs involving the musculoskeletal system: Secondary | ICD-10-CM

## 2021-01-19 DIAGNOSIS — M545 Low back pain, unspecified: Secondary | ICD-10-CM

## 2021-01-19 DIAGNOSIS — M25552 Pain in left hip: Secondary | ICD-10-CM

## 2021-01-19 DIAGNOSIS — M6281 Muscle weakness (generalized): Secondary | ICD-10-CM

## 2021-01-19 DIAGNOSIS — M542 Cervicalgia: Secondary | ICD-10-CM

## 2021-01-19 NOTE — Therapy (Signed)
[facility] [address] [place], [name], [address] Phone: [phone]   Fax:  [phone]  Physical Therapy Treatment  Patient Details  Name: Wendy Woods MRN: [id number] Date of Birth: 1960/09/19 Referring Provider (PT): Norlene Campbell, MD   Encounter Date: 01/19/2021   PT End of Session - 01/19/21 1016     Visit Number 6    Date for PT Re-Evaluation 02/21/21    Authorization Type UHC medicare 10 visit progress note    Progress Note Due on Visit 10    PT Start Time 0932    PT Stop Time 1030    PT Time Calculation (min) 58 min    Activity Tolerance Patient tolerated treatment well    Behavior During Therapy [facility] for tasks assessed/performed             Past Medical History:  Diagnosis Date   DDD (degenerative disc disease), cervical 03/22/2016   DDD (degenerative disc disease), lumbar 03/22/2016   GERD (gastroesophageal reflux disease)    Hyperlipidemia    Hypertension    Rheumatoid arthritis (HCC)    Rheumatoid arthritis(714.0)    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   Subjective Assessment - 01/19/21 0927     Subjective I'm doing water aerobics next. I was a little better after dry needling.  I felt looser and less pain.  I'm about the same, maybe a little better.    Patient Stated Goals to have less pain, return to prior level of function as before accident.    Currently in Pain? Yes    Pain Score 7     Pain Location Back    Pain Orientation Right;Left;Lower    Pain Descriptors / Indicators Aching;Sore    Pain Type Acute pain    Pain Onset 1 to 4 weeks ago    Pain Frequency  Constant    Aggravating Factors  standing and walking    Pain Relieving Factors heat/ice, pain medication                               [facility] Adult PT Treatment/Exercise - 01/19/21 0001       Exercises   Exercises Shoulder      Neck Exercises: Stretches   Upper Trapezius Stretch 3 reps;20 seconds      Lumbar Exercises: Stretches   Active Hamstring Stretch 2 reps;30 seconds;Left;Right    Active Hamstring Stretch Limitations seated    Lower Trunk Rotation Limitations 3x20 seconds      Lumbar Exercises: Aerobic   Nustep Level 1x 8 min- PT present to discuss progress      Lumbar Exercises: Seated   Other Seated Lumbar Exercises ball roll outs x 5      Shoulder Exercises: Supine   Horizontal ABduction Strengthening;Both;20 reps;Theraband    Theraband Level (Shoulder Horizontal ABduction) Level 1 (Yellow)    External Rotation Strengthening;Both;20 reps;Theraband    Theraband Level (Shoulder External Rotation) Level 1 (Yellow)      Electrical Stimulation   Electrical Stimulation Location low back and neck  Designer, industrial/product Parameters 20    Electrical Stimulation Goals Pain                       PT Short Term Goals - 01/19/21 0937       PT SHORT TERM GOAL #1   Title be independent in initial HEP    Status Achieved      PT SHORT TERM GOAL #2   Title report a 30% reduction in the frequency and intensity of headaches    Baseline daily headaches    Status On-going      PT SHORT TERM GOAL #3   Title report a 30% reduction in the frequency and intensity of neck and low back pain to improve function at home and at work    Status Achieved               PT Long Term Goals - 12/27/20 0906       PT LONG TERM GOAL #1   Title Patient will be indepdnent with water program and land based HEP to promote strengthening without a signifcant increase in pain    Baseline --    Time 8    Period  Weeks    Status New    Target Date 02/21/21      PT LONG TERM GOAL #2   Title improve FOTO to > or = to 53    Baseline 31    Time 8    Period Weeks    Status New    Target Date 02/21/21      PT LONG TERM GOAL #3   Title report > or = to 60% reduction in LBP and neck pain with home and work tasks    Baseline --    Time 8    Period Weeks    Status New    Target Date 02/21/21      PT LONG TERM GOAL #4   Title verbalize and demonstrate body mechanics and postural modificaitons for neck and lumbar protection with home and work tasks    Baseline --    Time 8    Period Weeks    Status New    Target Date 02/21/21      PT LONG TERM GOAL #5   Title demonstrate bil neck rotation to > or = to 65 degrees to improve safety with driving    Baseline limited by 75%    Time 8    Period Weeks    Status New    Target Date 02/21/21      Additional Long Term Goals   Additional Long Term Goals Yes      PT LONG TERM GOAL #6   Title Pt will increase LE strength to overall to > or = to 4+/5 when assessed in sitting to improve functional mobility    Baseline --    Time 8    Period Weeks    Status New    Target Date 02/21/21      PT LONG TERM GOAL #7   Title report > or = to 70% reduction in the frequency and intensity of headaches    Time 8    Period Weeks    Status New    Target Date 02/21/21                   Plan - 01/19/21 0949     Clinical Impression Statement Pt  reports that dry needling helped a little bit but not much.  Pt had relief of symptoms with electrical stimulation.   Pt has been doing her gentle flexibility exercises and reports that she has not seen any changes in her symptoms since the start of care.  Session focused on gentle flexibility and strength and pt tolerated this well without increased pain.   PT provided info regarding home TENs for pain management last session and she will look into ordering for her.  Pt will continue to benefit from skilled PT  to address LBP and limited mobility    PT Frequency 2x / week    PT Duration 8 weeks    PT Treatment/Interventions ADLs/Self Care Home Management;Cryotherapy;Electrical Stimulation;Moist Heat;Ultrasound;DME Instruction;Gait training;Stair training;Functional mobility training;Therapeutic activities;Therapeutic exercise;Neuromuscular re-education;Patient/family education;Manual techniques;Passive range of motion;Dry needling;Taping;Joint Manipulations;Aquatic Therapy    PT Next Visit Plan aquatics and progression of mobility out of the water.  Pain management.  Consider referral back to MD if no change in symptoms next week.    PT Home Exercise Plan Access Code: [email or web address]    Consulted and Agree with Plan of Care Patient             Patient will benefit from skilled therapeutic intervention in order to improve the following deficits and impairments:  Abnormal gait, Decreased endurance, Decreased mobility, Increased muscle spasms, Decreased range of motion, Decreased activity tolerance, Pain, Increased fascial restricitons, Decreased strength, Difficulty walking, Impaired flexibility  Visit Diagnosis: Chronic pain of right knee  Pain in left hip  Other abnormalities of gait and mobility  Chronic bilateral low back pain, unspecified whether sciatica present     Problem List Patient Active Problem List   Diagnosis Date Noted   Low back pain 01/12/2021   Osteoarthritis cervical spine 01/12/2021   Pain in left foot 12/15/2020   Unilateral primary osteoarthritis, left knee 01/10/2018   Chronic pain of left knee 01/10/2018   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   Angioedema 07/15/2014   Hypokalemia 07/15/2014   Diabetes mellitus, controlled (HCC) 07/03/2012   HTN (hypertension) 07/03/2012   Rheumatoid arthritis (HCC) 07/03/2012   Current chronic use of systemic steroids 07/03/2012   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 [place], [name], [address] Phone: [phone]   Fax:  [phone]  Name: Wendy Woods MRN: [id number] Date of Birth: 12-11-1960

## 2021-01-19 NOTE — Therapy (Signed)
[facility] [address] [place], [name], [address] Phone: [phone]   Fax:  [phone]  Physical Therapy Treatment  Patient Details  Name: Wendy Woods MRN: [id number] Date of Birth: 04/15/1961 Referring Provider (PT): Norlene Campbell, MD   Encounter Date: 01/19/2021   PT End of Session - 01/19/21 1336     Visit Number 7    Number of Visits 12    Date for PT Re-Evaluation 02/21/21    Authorization Type UHC medicare 10 visit progress note    PT Start Time 1116    PT Stop Time 1200    PT Time Calculation (min) 44 min    Equipment Utilized During Treatment Other (comment)    Activity Tolerance Patient tolerated treatment well    Behavior During Therapy [facility] for tasks assessed/performed             Past Medical History:  Diagnosis Date   DDD (degenerative disc disease), cervical 03/22/2016   DDD (degenerative disc disease), lumbar 03/22/2016   GERD (gastroesophageal reflux disease)    Hyperlipidemia    Hypertension    Rheumatoid arthritis (HCC)    Rheumatoid arthritis(714.0)    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   Subjective Assessment - 01/19/21 1332     Subjective "Just came from on land therapy.  I have to have them on the same day, still soar. Having MRI of left foot Sat"    Pain Score 7     Pain Orientation Right;Left;Lower    Pain Descriptors / Indicators Aching;Sore    Pain Type Acute pain    Pain Onset 1 to 4 weeks ago    Pain Frequency Constant    Pain Score 7    Pain Orientation Right;Left    Pain Descriptors / Indicators Aching    Pain Type Acute pain    Pain Onset 1 to 4  weeks ago    Pain Frequency Constant    Pain Score 8    Pain Orientation Left    Pain Descriptors / Indicators Aching    Pain Type Acute pain                                          PT Short Term Goals - 01/19/21 9371       PT SHORT TERM GOAL #1   Title be independent in initial HEP    Status Achieved      PT SHORT TERM GOAL #2   Title report a 30% reduction in the frequency and intensity of headaches    Baseline daily headaches    Status On-going      PT SHORT TERM GOAL #3   Title report a 30% reduction in the frequency and intensity of neck and low back pain to improve function at home and at work    Status Achieved               PT Long Term Goals - 12/27/20 0906       PT LONG TERM  GOAL #1   Title Patient will be indepdnent with water program and land based HEP to promote strengthening without a signifcant increase in pain    Baseline --    Time 8    Period Weeks    Status New    Target Date 02/21/21      PT LONG TERM GOAL #2   Title improve FOTO to > or = to 53    Baseline 31    Time 8    Period Weeks    Status New    Target Date 02/21/21      PT LONG TERM GOAL #3   Title report > or = to 60% reduction in LBP and neck pain with home and work tasks    Baseline --    Time 8    Period Weeks    Status New    Target Date 02/21/21      PT LONG TERM GOAL #4   Title verbalize and demonstrate body mechanics and postural modificaitons for neck and lumbar protection with home and work tasks    Baseline --    Time 8    Period Weeks    Status New    Target Date 02/21/21      PT LONG TERM GOAL #5   Title demonstrate bil neck rotation to > or = to 65 degrees to improve safety with driving    Baseline limited by 75%    Time 8    Period Weeks    Status New    Target Date 02/21/21      Additional Long Term Goals   Additional Long Term Goals Yes      PT LONG TERM GOAL #6   Title Pt will increase LE strength to overall to  > or = to 4+/5 when assessed in sitting to improve functional mobility    Baseline --    Time 8    Period Weeks    Status New    Target Date 02/21/21      PT LONG TERM GOAL #7   Title report > or = to 70% reduction in the frequency and intensity of headaches    Time 8    Period Weeks    Status New    Target Date 02/21/21            Pt seen for aquatic therapy today.  Treatment took place in water 3.25-4.8 ft in depth at the [facility] pool. Temp of water was 94.  Pt entered/exited the pool via stairs step to pattern independently with bilat rail.   Warm up: forward, backward and side stepping/walking cues for increased step length, increased speed, hand placement to increase resistance.   Standing Runners Stretch: gastroc, hamstrings, and glutes 3 x 30 sec hold   Standing -Cervical traction suspended in water, using round buoy 3 x 60 seconds, using body weight as counter weight. -Cervical spine ROM x 5 reps. - manual deep pressure/massage to sub occipitals and upper trap Shoulder ROM and strengthening with verba cues and demonstration for shoulder depression throughout: -horizontal abd and add pushing hand buoys on surface of water x 10 then submerged just under water x 5; flex x 10; add x 10 -Step ups on bottom step x 10 leding with RLE then x 5 left.  Pt limited due to left foot pain.   Pt requires buoyancy for support and to offload joints with strengthening exercises. Viscosity of the water is needed for resistance of  strengthening; water current perturbations provides challenge to standing balance unsupported, requiring increased core activation.           Plan - 01/19/21 1207     Clinical Impression Statement Pt scheduled for MRI left foot. Advanced gentle ROM and stretching of cervical spine and upper shoulders submerged 90%.  Added gentle cervical traction pt suspended cervically by buoy.  She reports some decrease in discomfort with technique.    PT  Treatment/Interventions ADLs/Self Care Home Management;Cryotherapy;Electrical Stimulation;Moist Heat;Ultrasound;DME Instruction;Gait training;Stair training;Functional mobility training;Therapeutic activities;Therapeutic exercise;Neuromuscular re-education;Patient/family education;Manual techniques;Passive range of motion;Dry needling;Taping;Joint Manipulations;Aquatic Therapy    PT Next Visit Plan aquatics and progression of mobility out of the water.  Pain management.  Consider referral back to MD if no change in symptoms next week.    PT Home Exercise Plan Access Code: [email or web address]             Patient will benefit from skilled therapeutic intervention in order to improve the following deficits and impairments:  Abnormal gait, Decreased endurance, Decreased mobility, Increased muscle spasms, Decreased range of motion, Decreased activity tolerance, Pain, Increased fascial restricitons, Decreased strength, Difficulty walking, Impaired flexibility  Visit Diagnosis: Chronic pain of right knee  Pain in left hip  Cervicalgia  Muscle weakness (generalized)  Chronic bilateral low back pain, unspecified whether sciatica present  Other abnormalities of gait and mobility  Other symptoms and signs involving the musculoskeletal system  Chronic pain of left knee     Problem List Patient Active Problem List   Diagnosis Date Noted   Low back pain 01/12/2021   Osteoarthritis cervical spine 01/12/2021   Pain in left foot 12/15/2020   Unilateral primary osteoarthritis, left knee 01/10/2018   Chronic pain of left knee 01/10/2018   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 [place], [name], [address] Phone: [phone]   Fax:  [phone]  Name: Wendy Woods MRN: [id number] Date of Birth: 1961/01/17

## 2021-01-24 ENCOUNTER — Ambulatory Visit (HOSPITAL_COMMUNITY)
Admission: RE | Admit: 2021-01-24 | Discharge: 2021-01-24 | Disposition: A | Discharge status: Home / Self Care | Payer: Medicare Other | Source: Ambulatory Visit | Attending: Rheumatology | Admitting: Rheumatology

## 2021-01-24 DIAGNOSIS — Z79899 Other long term (current) drug therapy: Secondary | ICD-10-CM | POA: Insufficient documentation

## 2021-01-24 DIAGNOSIS — M0579 Rheumatoid arthritis with rheumatoid factor of multiple sites without organ or systems involvement: Secondary | ICD-10-CM | POA: Diagnosis present

## 2021-01-24 LAB — CBC WITH DIFFERENTIAL/PLATELET
Abs Immature Granulocytes: 0.02 10*3/uL (ref 0.00–0.07)
Basophils Absolute: 0.1 10*3/uL (ref 0.0–0.1)
Basophils Relative: 1 %
Eosinophils Absolute: 0.3 10*3/uL (ref 0.0–0.5)
Eosinophils Relative: 4 %
HCT: 37.1 % (ref 36.0–46.0)
Hemoglobin: 12.2 g/dL (ref 12.0–15.0)
Immature Granulocytes: 0 %
Lymphocytes Relative: 37 %
Lymphs Abs: 2.7 10*3/uL (ref 0.7–4.0)
MCH: 29.4 pg (ref 26.0–34.0)
MCHC: 32.9 g/dL (ref 30.0–36.0)
MCV: 89.4 fL (ref 80.0–100.0)
Monocytes Absolute: 0.6 10*3/uL (ref 0.1–1.0)
Monocytes Relative: 7 %
Neutro Abs: 3.8 10*3/uL (ref 1.7–7.7)
Neutrophils Relative %: 51 %
Platelets: 391 10*3/uL (ref 150–400)
RBC: 4.15 MIL/uL (ref 3.87–5.11)
RDW: 12.6 % (ref 11.5–15.5)
WBC: 7.4 10*3/uL (ref 4.0–10.5)
nRBC: 0 % (ref 0.0–0.2)

## 2021-01-24 LAB — COMPREHENSIVE METABOLIC PANEL
ALT: 18 U/L (ref 0–44)
AST: 16 U/L (ref 15–41)
Albumin: 3.7 g/dL (ref 3.5–5.0)
Alkaline Phosphatase: 67 U/L (ref 38–126)
Anion gap: 11 (ref 5–15)
BUN: 18 mg/dL (ref 6–20)
CO2: 27 mmol/L (ref 22–32)
Calcium: 9.8 mg/dL (ref 8.9–10.3)
Chloride: 96 mmol/L — ABNORMAL LOW (ref 98–111)
Creatinine, Ser: 0.9 mg/dL (ref 0.44–1.00)
GFR, Estimated: >60 mL/min (ref >60)
Glucose, Bld: 311 mg/dL — ABNORMAL HIGH (ref 70–99)
Potassium: 3.4 mmol/L — ABNORMAL LOW (ref 3.5–5.1)
Sodium: 134 mmol/L — ABNORMAL LOW (ref 135–145)
Total Bilirubin: 0.7 mg/dL (ref 0.3–1.2)
Total Protein: 8.1 g/dL (ref 6.5–8.1)

## 2021-01-24 MED ORDER — ACETAMINOPHEN 325 MG PO TABS: 650.0000 mg | ORAL_TABLET | ORAL | Status: DC

## 2021-01-24 MED ORDER — SODIUM CHLORIDE 0.9 % IV SOLN
5.0000 mg/kg | INTRAVENOUS | Status: DC
  Administered 2021-01-24: 400 mg via INTRAVENOUS
  Filled 2021-01-24: qty 40

## 2021-01-24 MED ORDER — DIPHENHYDRAMINE HCL 25 MG PO CAPS: 25.0000 mg | ORAL_CAPSULE | ORAL | Status: DC
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
## 2021-01-24 NOTE — Progress Notes (Signed)
Glucose is very elevated-311.  Please notify the patient and advise her to monitor her blood sugar closely.   Sodium, chloride, and potassium are borderline low.  Please clarify if she has been taking the potassium supplement.

## 2021-01-24 NOTE — Progress Notes (Signed)
CBC with diff WNL

## 2021-01-26 ENCOUNTER — Ambulatory Visit: Payer: Medicare Other

## 2021-01-26 ENCOUNTER — Ambulatory Visit (HOSPITAL_BASED_OUTPATIENT_CLINIC_OR_DEPARTMENT_OTHER): Payer: Medicare Other | Admitting: Physical Therapy

## 2021-01-26 ENCOUNTER — Other Ambulatory Visit: Payer: Self-pay

## 2021-01-26 ENCOUNTER — Encounter (HOSPITAL_BASED_OUTPATIENT_CLINIC_OR_DEPARTMENT_OTHER): Payer: Self-pay | Admitting: Physical Therapy

## 2021-01-26 DIAGNOSIS — G8929 Other chronic pain: Secondary | ICD-10-CM

## 2021-01-26 DIAGNOSIS — M25552 Pain in left hip: Secondary | ICD-10-CM

## 2021-01-26 DIAGNOSIS — R252 Cramp and spasm: Secondary | ICD-10-CM

## 2021-01-26 DIAGNOSIS — R2689 Other abnormalities of gait and mobility: Secondary | ICD-10-CM

## 2021-01-26 DIAGNOSIS — M6281 Muscle weakness (generalized): Secondary | ICD-10-CM

## 2021-01-26 DIAGNOSIS — M25562 Pain in left knee: Secondary | ICD-10-CM

## 2021-01-26 DIAGNOSIS — M545 Low back pain, unspecified: Secondary | ICD-10-CM | POA: Diagnosis not present

## 2021-01-26 DIAGNOSIS — R29898 Other symptoms and signs involving the musculoskeletal system: Secondary | ICD-10-CM

## 2021-01-26 DIAGNOSIS — M542 Cervicalgia: Secondary | ICD-10-CM

## 2021-01-26 LAB — QUANTIFERON-TB GOLD PLUS: QuantiFERON-TB Gold Plus: NEGATIVE

## 2021-01-26 LAB — QUANTIFERON-TB GOLD PLUS (RQFGPL)
QuantiFERON Mitogen Value: 3.59 IU/mL
QuantiFERON Nil Value: 0 IU/mL
QuantiFERON TB1 Ag Value: 0.07 IU/mL
QuantiFERON TB2 Ag Value: 0.08 IU/mL

## 2021-01-26 NOTE — Therapy (Signed)
[facility] [address] [place], [name], [address] Phone: [phone]   Fax:  [phone]  Physical Therapy Treatment  Patient Details  Name: Wendy Woods MRN: [id number] Date of Birth: 1961-01-07 Referring Provider (PT): Norlene Campbell, MD   Encounter Date: 01/26/2021   PT End of Session - 01/26/21 1143     Visit Number 9    Number of Visits 12    Date for PT Re-Evaluation 02/21/21    Authorization Type [employer] medicare 10 visit progress note    Progress Note Due on Visit 10    PT Start Time 1116    PT Stop Time 1200    PT Time Calculation (min) 44 min    Activity Tolerance Patient tolerated treatment well    Behavior During Therapy [facility] for tasks assessed/performed             Past Medical History:  Diagnosis Date   DDD (degenerative disc disease), cervical 03/22/2016   DDD (degenerative disc disease), lumbar 03/22/2016   GERD (gastroesophageal reflux disease)    Hyperlipidemia    Hypertension    Rheumatoid arthritis (HCC)    Rheumatoid arthritis(714.0)    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   Subjective Assessment - 01/26/21 1324     Subjective Hurting all over, shoulders a little better since on land visit earlier today."                                          PT Short Term Goals - 01/19/21 0937       PT SHORT TERM GOAL #1   Title be independent in initial HEP    Status Achieved      PT SHORT TERM GOAL #2   Title report a 30% reduction in the frequency and intensity of headaches    Baseline daily headaches    Status On-going      PT SHORT TERM  GOAL #3   Title report a 30% reduction in the frequency and intensity of neck and low back pain to improve function at home and at work    Status Achieved               PT Long Term Goals - 12/27/20 0906       PT LONG TERM GOAL #1   Title Patient will be indepdnent with water program and land based HEP to promote strengthening without a signifcant increase in pain    Baseline --    Time 8    Period Weeks    Status New    Target Date 02/21/21      PT LONG TERM GOAL #2   Title improve FOTO to > or = to 53    Baseline 31    Time 8    Period Weeks    Status New    Target Date 02/21/21      PT LONG TERM GOAL #3   Title report > or = to 60% reduction in LBP and neck pain with home  and work tasks    Baseline --    Time 8    Period Weeks    Status New    Target Date 02/21/21      PT LONG TERM GOAL #4   Title verbalize and demonstrate body mechanics and postural modificaitons for neck and lumbar protection with home and work tasks    Baseline --    Time 8    Period Weeks    Status New    Target Date 02/21/21      PT LONG TERM GOAL #5   Title demonstrate bil neck rotation to > or = to 65 degrees to improve safety with driving    Baseline limited by 75%    Time 8    Period Weeks    Status New    Target Date 02/21/21      Additional Long Term Goals   Additional Long Term Goals Yes      PT LONG TERM GOAL #6   Title Pt will increase LE strength to overall to > or = to 4+/5 when assessed in sitting to improve functional mobility    Baseline --    Time 8    Period Weeks    Status New    Target Date 02/21/21      PT LONG TERM GOAL #7   Title report > or = to 70% reduction in the frequency and intensity of headaches    Time 8    Period Weeks    Status New    Target Date 02/21/21            Pt seen for aquatic therapy today.  Treatment took place in water 3.25-4.8 ft in depth at the [facility] pool. Temp of water was 94.  Pt entered/exited the pool  via stairs step to pattern independently with bilat rail.   Warm up: forward, backward and side stepping/walking cues for increased step length, increased speed, hand placement to increase resistance 6-8 widths   Standing Stretching:   -Runners Stretch: gastroc, hamstrings, and glutes 3 x 30 sec hold -lb in pike position at step holding to hand rails 3 x 30 sec, hip hiking R/L Latissimus and oblique stretch 3 x 30 sec. -Cervical traction suspended in water, using round buoy x 5, using body weight as counter weight. -Cervical spine ROM x 5 reps all joints all planes. Strengthening -kick board push downs 3 x 10 reps. VC for abdominal and glute tightness throughout -Lateral core rotation R/L x 10. VC for speed and submerging board for increased resistance.  supine suspension  using nekdoodle, ankle cuffs and noodles Cervical decompression - manual deep pressure/massage to sub occipitals and upper trap    Pt requires buoyancy for support and to offload joints with strengthening exercises. Viscosity of the water is needed for resistance of strengthening; water current perturbations provides challenge to standing balance unsupported, requiring increased core activation.             Plan - 01/26/21 1325     Clinical Impression Statement Pt with decreased visable trap tightness and sub occipital tightness with palpation today.  Increased gentle cervical traction using neck buoy. ROM cervical spine wfl today after on lnad then aquatics.  Added core strengthening exercises and increased LB stretching.    Stability/Clinical Decision Making Evolving/Moderate complexity    Clinical Decision Making Moderate    Rehab Potential Good    PT Frequency 2x / week    PT Treatment/Interventions ADLs/Self Care  Home Management;Cryotherapy;Electrical Stimulation;Moist Heat;Ultrasound;DME Instruction;Gait training;Stair training;Functional mobility training;Therapeutic activities;Therapeutic  exercise;Neuromuscular re-education;Patient/family education;Manual techniques;Passive range of motion;Dry needling;Taping;Joint Manipulations;Aquatic Therapy    PT Next Visit Plan advance core strengthening    PT Home Exercise Plan Access Code: [email or web address]             Patient will benefit from skilled therapeutic intervention in order to improve the following deficits and impairments:  Abnormal gait, Decreased endurance, Decreased mobility, Increased muscle spasms, Decreased range of motion, Decreased activity tolerance, Pain, Increased fascial restricitons, Decreased strength, Difficulty walking, Impaired flexibility  Visit Diagnosis: Chronic pain of right knee  Pain in left hip  Other abnormalities of gait and mobility  Chronic bilateral low back pain, unspecified whether sciatica present  Cervicalgia  Chronic pain of left knee  Cramp and spasm  Other symptoms and signs involving the musculoskeletal system  Muscle weakness (generalized)     Problem List Patient Active Problem List   Diagnosis Date Noted   Low back pain 01/12/2021   Osteoarthritis cervical spine 01/12/2021   Pain in left foot 12/15/2020   Unilateral primary osteoarthritis, left knee 01/10/2018   Chronic pain of left knee 01/10/2018   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   Angioedema 07/15/2014   Hypokalemia 07/15/2014   Diabetes mellitus, controlled (HCC) 07/03/2012   HTN (hypertension) 07/03/2012   Rheumatoid arthritis (HCC) 07/03/2012   Current chronic use of systemic steroids 07/03/2012   

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
## 2021-01-29 ENCOUNTER — Other Ambulatory Visit: Payer: Self-pay

## 2021-01-29 ENCOUNTER — Ambulatory Visit
Admission: RE | Admit: 2021-01-29 | Discharge: 2021-01-29 | Disposition: A | Discharge status: Home / Self Care | Payer: Medicare Other | Source: Ambulatory Visit | Attending: Orthopaedic Surgery | Admitting: Orthopaedic Surgery

## 2021-01-29 DIAGNOSIS — M79672 Pain in left foot: Secondary | ICD-10-CM

## 2021-02-01 ENCOUNTER — Encounter: Payer: Self-pay | Admitting: Orthopaedic Surgery

## 2021-02-01 ENCOUNTER — Other Ambulatory Visit: Payer: Self-pay

## 2021-02-01 ENCOUNTER — Ambulatory Visit (INDEPENDENT_AMBULATORY_CARE_PROVIDER_SITE_OTHER): Payer: Medicare Other | Admitting: Orthopaedic Surgery

## 2021-02-01 DIAGNOSIS — M5442 Lumbago with sciatica, left side: Secondary | ICD-10-CM | POA: Diagnosis not present

## 2021-02-01 DIAGNOSIS — M47812 Spondylosis without myelopathy or radiculopathy, cervical region: Secondary | ICD-10-CM

## 2021-02-01 DIAGNOSIS — M79672 Pain in left foot: Secondary | ICD-10-CM | POA: Diagnosis not present

## 2021-02-01 DIAGNOSIS — M542 Cervicalgia: Secondary | ICD-10-CM | POA: Diagnosis not present
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
## 2021-02-02 ENCOUNTER — Ambulatory Visit (HOSPITAL_BASED_OUTPATIENT_CLINIC_OR_DEPARTMENT_OTHER): Payer: Medicare Other | Attending: Rheumatology | Admitting: Physical Therapy

## 2021-02-02 ENCOUNTER — Ambulatory Visit: Payer: Medicare Other

## 2021-02-02 DIAGNOSIS — M25562 Pain in left knee: Secondary | ICD-10-CM | POA: Diagnosis present

## 2021-02-02 DIAGNOSIS — M545 Low back pain, unspecified: Secondary | ICD-10-CM

## 2021-02-02 DIAGNOSIS — M25552 Pain in left hip: Secondary | ICD-10-CM | POA: Insufficient documentation

## 2021-02-02 DIAGNOSIS — M25561 Pain in right knee: Secondary | ICD-10-CM | POA: Insufficient documentation

## 2021-02-02 DIAGNOSIS — M542 Cervicalgia: Secondary | ICD-10-CM | POA: Diagnosis present

## 2021-02-02 DIAGNOSIS — G8929 Other chronic pain: Secondary | ICD-10-CM | POA: Diagnosis present

## 2021-02-02 DIAGNOSIS — R2689 Other abnormalities of gait and mobility: Secondary | ICD-10-CM | POA: Insufficient documentation

## 2021-02-02 DIAGNOSIS — R252 Cramp and spasm: Secondary | ICD-10-CM | POA: Diagnosis present

## 2021-02-02 DIAGNOSIS — R29898 Other symptoms and signs involving the musculoskeletal system: Secondary | ICD-10-CM | POA: Insufficient documentation

## 2021-02-02 DIAGNOSIS — M6281 Muscle weakness (generalized): Secondary | ICD-10-CM | POA: Diagnosis present

## 2021-02-02 NOTE — Therapy (Signed)
[facility] [address] [place], [name], [address] Phone: [phone]   Fax:  [phone]  Physical Therapy Treatment  Patient Details  Name: Wendy Woods MRN: [id number] Date of Birth: [birth year] Referring Provider (PT): Norlene Campbell, MD   Encounter Date: 02/02/2021   PT End of Session - 02/02/21 1120     Visit Number 11    Date for PT Re-Evaluation 02/21/21    Authorization Type UHC medicare 10 visit progress note    Progress Note Due on Visit 10    PT Start Time 1117    PT Stop Time 1200    PT Time Calculation (min) 43 min    Equipment Utilized During Treatment Other (comment)    Activity Tolerance Patient tolerated treatment well    Behavior During Therapy [facility] for tasks assessed/performed             Past Medical History:  Diagnosis Date   DDD (degenerative disc disease), cervical 03/22/2016   DDD (degenerative disc disease), lumbar 03/22/2016   GERD (gastroesophageal reflux disease)    Hyperlipidemia    Hypertension    Rheumatoid arthritis (HCC)    Rheumatoid arthritis(714.0)    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   Subjective Assessment - 02/02/21 1202     Subjective "Neck is ok, about the same.  MRI of foot showed OA."    Pain Score 6    Pain Location Foot    Pain Orientation Left    Pain Descriptors / Indicators Aching    Pain Type Acute pain    Pain Onset 1 to 4 weeks ago    Aggravating Factors  weight bearing    Pain Relieving Factors nonweight bearing    Effect of Pain on Daily Activities toleration to standing                                           PT Short Term Goals - 02/02/21 0944       PT SHORT TERM GOAL #2   Title report a 30% reduction in the frequency and intensity of headaches    Status On-going               PT Long Term Goals - 02/02/21 1022       PT LONG TERM GOAL #1   Title Patient will be indepdnent with water program and land based HEP to promote strengthening without a signifcant increase in pain    Status On-going      PT LONG TERM GOAL #2   Title improve FOTO to > or = to 53    Status On-going      PT LONG TERM GOAL #3   Title report > or = to 60% reduction in LBP and neck pain with home and work tasks    Baseline 10%    Status On-going      PT LONG TERM GOAL #4   Title verbalize and demonstrate body mechanics and postural modificaitons for neck and lumbar protection with  home and work tasks    Status Achieved      PT LONG TERM GOAL #5   Title demonstrate bil neck rotation to > or = to 65 degrees to improve safety with driving    Status On-going      PT LONG TERM GOAL #6   Title Pt will increase LE strength to overall to > or = to 4+/5 when assessed in sitting to improve functional mobility    Status On-going      PT LONG TERM GOAL #7   Title report > or = to 70% reduction in the frequency and intensity of headaches    Status On-going           Pt seen for aquatic therapy today.  Treatment took place in water 3.25-4.8 ft in depth at the [facility] pool. Temp of water was 94.  Pt entered/exited the pool via stairs step to pattern independently with bilat rail.   Warm up: forward, backward and side stepping/walking cues for increased step length, increased speed, hand placement to increase resistance 6-8 widths  Manual therapy: tarsal joint mobs/cuneiforms and metatarsals grade 2   Standing Stretching:   -Runners Stretch: gastroc, hamstrings, and glutes 3 x 30 sec hold Strengthening -kick board push downs 3 x 10  reps. VC for abdominal and glute tightness throughout, manual perturbations  -Lateral core rotation R/L x 10. VC for speed and submerging board for increased resistance.  Vertical suspension Using 2 foam hand buoys: -core strengthening knees to chest 2 x 10 -vertical<>sup and holding x 10 sec x 4 trials. VC, TC and demonstration for proper execution -supine holding position using hand buoys and assistance of PT knees to chest x 10 then x 10 unassisted. Pt completes without full supine due to core weakness -vertical<>prone with mod -min assist x 3.  Pt unable to gain nor maintain position indep   Pt requires buoyancy for support and to offload joints with strengthening exercises. Viscosity of the water is needed for resistance of strengthening; water current perturbations provides challenge to standing balance unsupported, requiring increased core activation.         Plan - 02/02/21 1204     Clinical Impression Statement Joint mobiliation of left foot decreases discomfort in area.  Edu on shoe inserts for increased arch support. Advanced core strengthening today and LB stretching.  Pt unable to maintain prone suspension usign hand buoys due to core weakness. No increase in back or neck discomfort with prescribed exercises. Will benefit from continued aquatic therapy to improve on land mobility.    Examination-Activity Limitations Sit;Squat;Stairs;Stand;Locomotion Level    Stability/Clinical Decision Making Evolving/Moderate complexity    Clinical Decision Making Moderate    Rehab Potential Good    PT Frequency 2x / week    PT Duration 8 weeks    PT Treatment/Interventions ADLs/Self Care Home Management;Cryotherapy;Electrical Stimulation;Moist Heat;Ultrasound;DME Instruction;Gait training;Stair training;Functional mobility training;Therapeutic activities;Therapeutic exercise;Neuromuscular re-education;Patient/family education;Manual techniques;Passive range of motion;Dry needling;Taping;Joint  Manipulations;Aquatic Therapy    PT Next Visit Plan advance core strengthening, continue pain management and aquatics    PT Home Exercise Plan Access Code: [email or web address]             Patient will benefit from skilled therapeutic intervention in order to improve the following deficits and impairments:  Abnormal gait, Decreased endurance, Decreased mobility, Increased muscle spasms, Decreased range of motion, Decreased activity tolerance, Pain, Increased fascial restricitons, Decreased strength, Difficulty walking, Impaired flexibility  Visit Diagnosis: Chronic pain of right knee  Pain in left hip  Other abnormalities of gait and mobility  Chronic bilateral low back pain, unspecified whether sciatica present  Other symptoms and signs involving the musculoskeletal system  Cramp and spasm  Chronic pain of left knee  Muscle weakness (generalized)     Problem List Patient Active Problem List   Diagnosis Date Noted   Low back pain 01/12/2021   Osteoarthritis cervical spine 01/12/2021   Pain in left foot 12/15/2020   Unilateral primary osteoarthritis, left knee 01/10/2018   Chronic pain of left knee 01/10/2018   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   Angioedema 07/15/2014   Hypokalemia 07/15/2014   Diabetes mellitus, controlled (HCC) 07/03/2012   HTN (hypertension) 07/03/2012   Rheumatoid arthritis (HCC) 07/03/2012   Current chronic use of systemic steroids 07/03/2012   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

## 2021-02-02 NOTE — Therapy (Signed)
[facility] [address] [place], [name], [address] Phone:     Fax:     Physical Therapy Treatment  Patient Details  Name: Wendy Woods MRN: [id number] Date of Birth: May 03, 1960 Referring Provider (PT): Norlene Campbell, MD   Encounter Date: 02/02/2021 Progress Note Reporting Period 12/27/20 to 02/02/21  See note below for Objective Data and Assessment of Progress/Goals.       PT End of Session - 02/02/21 1015     Visit Number 10    Date for PT Re-Evaluation 02/21/21    Authorization Type UHC medicare 10 visit progress note    Progress Note Due on Visit 10    PT Start Time 0931    PT Stop Time 1030    PT Time Calculation (min) 59 min    Activity Tolerance Patient tolerated treatment well    Behavior During Therapy [facility] for tasks assessed/performed             Past Medical History:  Diagnosis Date   DDD (degenerative disc disease), cervical 03/22/2016   DDD (degenerative disc disease), lumbar 03/22/2016   GERD (gastroesophageal reflux disease)    Hyperlipidemia    Hypertension    Rheumatoid arthritis (HCC)    Rheumatoid arthritis(714.0)    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   Subjective Assessment - 02/02/21 0935     Subjective I am about the same.  I saw MD yesterday and he wants me to keep doing therapy.  He will order an MRI of my neck.    Currently in Pain? Yes    Pain Score 7     Pain Location Back    Pain Orientation Right;Left;Lower    Pain Descriptors / Indicators Aching;Sore    Pain Type Chronic pain    Pain Onset More than a month ago    Pain Frequency Constant     Aggravating Factors  standing and walking    Pain Relieving Factors heat, ice, pain medication                [facility] PT Assessment - 02/02/21 0001       Assessment   Medical Diagnosis neck pain, low back pain    Referring Provider (PT) Norlene Campbell, MD    Onset Date/Surgical Date 12/04/20      Prior Function   Level of Independence Independent    Vocation Part time employment    Vocation Requirements [employer]- lifting, carrying, pushing      Cognition   Overall Cognitive Status Within Functional Limits for tasks assessed      AROM   Cervical - Right Side Bend 35    Cervical - Left Side Bend 35    Cervical - Right Rotation 35    Cervical - Left Rotation 25                           [facility] Adult PT Treatment/Exercise - 02/02/21 0001       Neck Exercises: Supine   Other Supine Exercise Melt method for neck on  foam roll x 10 each movement      Neck Exercises: Stretches   Upper Trapezius Stretch 3 reps;20 seconds      Lumbar Exercises: Stretches   Active Hamstring Stretch 2 reps;30 seconds;Left;Right    Active Hamstring Stretch Limitations seated      Lumbar Exercises: Aerobic   Nustep Level 1x 10 min- PT present to discuss progress      Lumbar Exercises: Seated   Other Seated Lumbar Exercises ball roll outs x10      Shoulder Exercises: Supine   Horizontal ABduction Strengthening;Both;20 reps;Theraband    Theraband Level (Shoulder Horizontal ABduction) Level 1 (Yellow)    External Rotation Strengthening;Both;20 reps;Theraband    Theraband Level (Shoulder External Rotation) Level 1 (Yellow)      Shoulder Exercises: Seated   Flexion Strengthening;Both;20 reps;Weights    Flexion Weight (lbs) 1      Moist Heat Therapy   Number Minutes Moist Heat 20 Minutes    Moist Heat Location Lumbar Spine;Cervical      Electrical Stimulation   Electrical Stimulation Location low back and neck    Electrical Stimulation Action premod     Electrical Stimulation Parameters 20    Electrical Stimulation Goals Pain                       PT Short Term Goals - 02/02/21 0944       PT SHORT TERM GOAL #2   Title report a 30% reduction in the frequency and intensity of headaches    Status On-going               PT Long Term Goals - 02/02/21 1022       PT LONG TERM GOAL #1   Title Patient will be indepdnent with water program and land based HEP to promote strengthening without a signifcant increase in pain    Status On-going      PT LONG TERM GOAL #2   Title improve FOTO to > or = to 53    Status On-going      PT LONG TERM GOAL #3   Title report > or = to 60% reduction in LBP and neck pain with home and work tasks    Baseline 10%    Status On-going      PT LONG TERM GOAL #4   Title verbalize and demonstrate body mechanics and postural modificaitons for neck and lumbar protection with home and work tasks    Status Achieved      PT LONG TERM GOAL #5   Title demonstrate bil neck rotation to > or = to 65 degrees to improve safety with driving    Status On-going      PT LONG TERM GOAL #6   Title Pt will increase LE strength to overall to > or = to 4+/5 when assessed in sitting to improve functional mobility    Status On-going      PT LONG TERM GOAL #7   Title report > or = to 70% reduction in the frequency and intensity of headaches    Status On-going                   Plan - 02/02/21 1019     Clinical Impression Statement Pt reports 8% better since the start of care.  Headaches are 10% better since the start of care.    Pt has been doing her gentle flexibility exercises and attending aquatic PT.  Session  focused on gentle flexibility and strength and pt tolerated this well without increased pain.   Pt continues to be challenged by current level of exercise.  Progress has been slow due to continued soft tissue pain that limits mobility.  Pt will continue to benefit from skilled PT to  address LBP and limited mobility.  Pt will have MRI of neck and back per MD soon.    PT Frequency 2x / week    PT Duration 8 weeks    PT Treatment/Interventions ADLs/Self Care Home Management;Cryotherapy;Electrical Stimulation;Moist Heat;Ultrasound;DME Instruction;Gait training;Stair training;Functional mobility training;Therapeutic activities;Therapeutic exercise;Neuromuscular re-education;Patient/family education;Manual techniques;Passive range of motion;Dry needling;Taping;Joint Manipulations;Aquatic Therapy    PT Next Visit Plan advance core strengthening, continue pain management and aquatics    PT Home Exercise Plan Access Code: [email or web address]    Consulted and Agree with Plan of Care Patient             Patient will benefit from skilled therapeutic intervention in order to improve the following deficits and impairments:  Abnormal gait, Decreased endurance, Decreased mobility, Increased muscle spasms, Decreased range of motion, Decreased activity tolerance, Pain, Increased fascial restricitons, Decreased strength, Difficulty walking, Impaired flexibility  Visit Diagnosis: Pain in left hip  Chronic pain of right knee  Other abnormalities of gait and mobility  Chronic bilateral low back pain, unspecified whether sciatica present  Cervicalgia  Cramp and spasm     Problem List Patient Active Problem List   Diagnosis Date Noted   Low back pain 01/12/2021   Osteoarthritis cervical spine 01/12/2021   Pain in left foot 12/15/2020   Unilateral primary osteoarthritis, left knee 01/10/2018   Chronic pain of left knee 01/10/2018   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   Angioedema 07/15/2014   Hypokalemia 07/15/2014   Diabetes mellitus, controlled (HCC) 07/03/2012   HTN (hypertension) 07/03/2012   Rheumatoid arthritis (HCC) 07/03/2012   Current chronic use of systemic steroids 07/03/2012   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

## 2021-02-07 ENCOUNTER — Ambulatory Visit (INDEPENDENT_AMBULATORY_CARE_PROVIDER_SITE_OTHER): Payer: Medicare Other | Admitting: Physician Assistant

## 2021-02-07 ENCOUNTER — Other Ambulatory Visit: Payer: Self-pay

## 2021-02-07 ENCOUNTER — Encounter: Payer: Self-pay | Admitting: Physician Assistant

## 2021-02-07 VITALS — BP 136/85 | HR 69 | Ht 64.0 in | Wt 181.2 lb

## 2021-02-07 DIAGNOSIS — M5136 Other intervertebral disc degeneration, lumbar region: Secondary | ICD-10-CM

## 2021-02-07 DIAGNOSIS — M0579 Rheumatoid arthritis with rheumatoid factor of multiple sites without organ or systems involvement: Secondary | ICD-10-CM | POA: Diagnosis not present

## 2021-02-07 DIAGNOSIS — M170 Bilateral primary osteoarthritis of knee: Secondary | ICD-10-CM

## 2021-02-07 DIAGNOSIS — Z79899 Other long term (current) drug therapy: Secondary | ICD-10-CM

## 2021-02-07 DIAGNOSIS — I10 Essential (primary) hypertension: Secondary | ICD-10-CM

## 2021-02-07 DIAGNOSIS — H15003 Unspecified scleritis, bilateral: Secondary | ICD-10-CM | POA: Diagnosis not present

## 2021-02-07 DIAGNOSIS — M19042 Primary osteoarthritis, left hand: Secondary | ICD-10-CM

## 2021-02-07 DIAGNOSIS — M5030 Other cervical disc degeneration, unspecified cervical region: Secondary | ICD-10-CM

## 2021-02-07 DIAGNOSIS — M19041 Primary osteoarthritis, right hand: Secondary | ICD-10-CM | POA: Diagnosis not present

## 2021-02-07 DIAGNOSIS — Z8639 Personal history of other endocrine, nutritional and metabolic disease: Secondary | ICD-10-CM

## 2021-02-07 DIAGNOSIS — M51369 Other intervertebral disc degeneration, lumbar region without mention of lumbar back pain or lower extremity pain: Secondary | ICD-10-CM

## 2021-02-07 DIAGNOSIS — M8589 Other specified disorders of bone density and structure, multiple sites: Secondary | ICD-10-CM

## 2021-02-09 ENCOUNTER — Ambulatory Visit: Payer: Medicare Other

## 2021-02-09 ENCOUNTER — Encounter (HOSPITAL_BASED_OUTPATIENT_CLINIC_OR_DEPARTMENT_OTHER): Payer: Self-pay | Admitting: Physical Therapy

## 2021-02-09 ENCOUNTER — Ambulatory Visit (HOSPITAL_BASED_OUTPATIENT_CLINIC_OR_DEPARTMENT_OTHER): Payer: Medicare Other | Admitting: Physical Therapy

## 2021-02-09 ENCOUNTER — Other Ambulatory Visit: Payer: Self-pay

## 2021-02-09 DIAGNOSIS — M6281 Muscle weakness (generalized): Secondary | ICD-10-CM

## 2021-02-09 DIAGNOSIS — R29898 Other symptoms and signs involving the musculoskeletal system: Secondary | ICD-10-CM

## 2021-02-09 DIAGNOSIS — M25562 Pain in left knee: Secondary | ICD-10-CM

## 2021-02-09 DIAGNOSIS — M542 Cervicalgia: Secondary | ICD-10-CM

## 2021-02-09 DIAGNOSIS — M25561 Pain in right knee: Secondary | ICD-10-CM | POA: Diagnosis not present

## 2021-02-09 DIAGNOSIS — M25552 Pain in left hip: Secondary | ICD-10-CM

## 2021-02-09 DIAGNOSIS — G8929 Other chronic pain: Secondary | ICD-10-CM

## 2021-02-09 DIAGNOSIS — R252 Cramp and spasm: Secondary | ICD-10-CM

## 2021-02-09 DIAGNOSIS — M545 Low back pain, unspecified: Secondary | ICD-10-CM

## 2021-02-09 DIAGNOSIS — R2689 Other abnormalities of gait and mobility: Secondary | ICD-10-CM

## 2021-02-09 NOTE — Therapy (Signed)
[facility] [address] [place], [name], [address] Phone: [phone]   Fax:  [phone]  Physical Therapy Treatment  Patient Details  Name: Wendy Woods MRN: [id number] Date of Birth: [birth year] Referring Provider (PT): Norlene Campbell, MD   Encounter Date: 02/09/2021   PT End of Session - 02/09/21 1120     Visit Number 13    Number of Visits 12    Date for PT Re-Evaluation 02/21/21    Authorization Type UHC medicare 10 visit progress note    PT Start Time 1118    PT Stop Time 1200    PT Time Calculation (min) 42 min    Equipment Utilized During Treatment Other (comment)    Activity Tolerance Patient tolerated treatment well    Behavior During Therapy [facility] for tasks assessed/performed             Past Medical History:  Diagnosis Date   DDD (degenerative disc disease), cervical 03/22/2016   DDD (degenerative disc disease), lumbar 03/22/2016   GERD (gastroesophageal reflux disease)    Hyperlipidemia    Hypertension    Rheumatoid arthritis (HCC)    Rheumatoid arthritis(714.0)    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   Subjective Assessment - 02/09/21 1125     Subjective Foot feels better, I have been wearing my good shoes""                                          PT Short Term Goals - 02/09/21 1123       PT SHORT TERM GOAL #2   Baseline continues to be daily.  Pt reports they have improved but only about 10%. 02/09/21               PT Long Term Goals - 02/02/21 1022       PT LONG TERM GOAL #1   Title Patient will be indepdnent with water program  and land based HEP to promote strengthening without a signifcant increase in pain    Status On-going      PT LONG TERM GOAL #2   Title improve FOTO to > or = to 53    Status On-going      PT LONG TERM GOAL #3   Title report > or = to 60% reduction in LBP and neck pain with home and work tasks    Baseline 10%    Status On-going      PT LONG TERM GOAL #4   Title verbalize and demonstrate body mechanics and postural modificaitons for neck and lumbar protection with home and work tasks    Status Achieved      PT LONG TERM GOAL #5   Title demonstrate bil neck rotation to > or = to 65 degrees to improve safety with driving    Status On-going      PT LONG TERM GOAL #6   Title Pt will increase LE strength to overall to > or = to 4+/5 when assessed in sitting to improve functional  mobility    Status On-going      PT LONG TERM GOAL #7   Title report > or = to 70% reduction in the frequency and intensity of headaches    Status On-going                Pt seen for aquatic therapy today.  Treatment took place in water 3.25-4.8 ft in depth at the [facility] pool. Temp of water was 94.  Pt entered/exited the pool via stairs step to pattern independently with bilat rail.   Warm up: forward, backward and side stepping/walking cues for increased step length, increased speed, hand placement to increase resistance 6-8 widths   Manual therapy: tarsal joint mobs/cuneiforms and metatarsals grade 2   Standing Stretching:   -Runners Stretch: gastroc, hamstrings, and glutes 3 x 30 sec hold -UE using 2 foam hand buoy horizontal abd x 10   Vertical suspension Using 2 foam hand buoys: -core strengthening knees to chest 2 x 10 Vertical -vertical<>sup and holding x 10 sec x 4 trials. VC, TC and demonstration for proper execution -supine holding position using hand buoys and assistance of PT knees to chest x 10 assisted. Pt completes without full supine due to core weakness  Cervical  distraction x 5 min using yellow cervical buoy   Pt requires buoyancy for support and to offload joints with strengthening exercises. Viscosity of the water is needed for resistance of strengthening; water current perturbations provides challenge to standing balance unsupported, requiring increased core activation.         Plan - 02/09/21 1330     Clinical Impression Statement Pt with "a couple days " of foot pain improvement after mobilization last session.  Instructed pt on self mobilization. Gentle cervical distraction completed with cervical buoy. LB stretching and core strengthening completed. Pt engages in all activities with some discomfort in upper traps, shoulders and c spine.    Stability/Clinical Decision Making Evolving/Moderate complexity    Clinical Decision Making Moderate    Rehab Potential Good    PT Frequency 2x / week    PT Duration 8 weeks    PT Treatment/Interventions ADLs/Self Care Home Management;Cryotherapy;Electrical Stimulation;Moist Heat;Ultrasound;DME Instruction;Gait training;Stair training;Functional mobility training;Therapeutic activities;Therapeutic exercise;Neuromuscular re-education;Patient/family education;Manual techniques;Passive range of motion;Dry needling;Taping;Joint Manipulations;Aquatic Therapy    PT Next Visit Plan advance core strengthening, continue pain management and aquatics    PT Home Exercise Plan Access Code: [email or web address]             Patient will benefit from skilled therapeutic intervention in order to improve the following deficits and impairments:  Abnormal gait, Decreased endurance, Decreased mobility, Increased muscle spasms, Decreased range of motion, Decreased activity tolerance, Pain, Increased fascial restricitons, Decreased strength, Difficulty walking, Impaired flexibility  Visit Diagnosis: Pain in left hip  Cervicalgia  Muscle weakness (generalized)  Cramp and spasm  Chronic pain of right knee  Other abnormalities of  gait and mobility  Other symptoms and signs involving the musculoskeletal system  Chronic pain of left knee  Chronic bilateral low back pain, unspecified whether sciatica present     Problem List Patient Active Problem List   Diagnosis Date Noted   Low back pain 01/12/2021   Osteoarthritis cervical spine 01/12/2021   Pain in left foot 12/15/2020   Unilateral primary osteoarthritis, left knee 01/10/2018   Chronic pain of left knee 01/10/2018   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   Angioedema 07/15/2014   Hypokalemia 07/15/2014   Diabetes mellitus, controlled (HCC) 07/03/2012   HTN (hypertension)  07/03/2012   Rheumatoid arthritis (HCC) 07/03/2012   Current chronic use of systemic steroids 07/03/2012   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

## 2021-02-09 NOTE — Therapy (Signed)
[facility] [address] [place], [name], [address] Phone: [phone]   Fax:  [phone]  Physical Therapy Treatment  Patient Details  Name: Wendy Woods MRN: [id number] Date of Birth: Aug 28, 1960 Referring Provider (PT): Norlene Campbell, MD   Encounter Date: 02/09/2021   PT End of Session - 02/09/21 1007     Visit Number 12    Date for PT Re-Evaluation 02/21/21    Progress Note Due on Visit 20    PT Start Time 0927    PT Stop Time 1026    PT Time Calculation (min) 59 min    Activity Tolerance Patient tolerated treatment well    Behavior During Therapy [facility] for tasks assessed/performed             Past Medical History:  Diagnosis Date   DDD (degenerative disc disease), cervical 03/22/2016   DDD (degenerative disc disease), lumbar 03/22/2016   GERD (gastroesophageal reflux disease)    Hyperlipidemia    Hypertension    Rheumatoid arthritis (HCC)    Rheumatoid arthritis(714.0)    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   Subjective Assessment - 02/09/21 0929     Subjective I'm sore today.  I am not getting better very quickly.    Currently in Pain? Yes    Pain Location Back    Pain Orientation Right;Left;Lower    Pain Descriptors / Indicators Aching;Sore    Pain Type Chronic pain    Pain Onset More than a month ago    Pain Frequency Constant    Aggravating Factors  activity    Pain Relieving Factors heat, ice, pain medication                               [facility] Adult PT Treatment/Exercise - 02/09/21 0001       Neck Exercises: Machines for Strengthening   UBE  (Upper Arm Bike) level 1x 4 minutes (2/2)      Neck Exercises: Stretches   Upper Trapezius Stretch 3 reps;20 seconds      Lumbar Exercises: Stretches   Active Hamstring Stretch 2 reps;30 seconds;Left;Right    Active Hamstring Stretch Limitations seated    Lower Trunk Rotation Limitations 3x20 seconds      Lumbar Exercises: Aerobic   Nustep Level 2 x 10 min- PT present to discuss progress      Lumbar Exercises: Standing   Other Standing Lumbar Exercises standing on foam pad: 3 way weight shifting x1 min each      Lumbar Exercises: Seated   Other Seated Lumbar Exercises sit to stand 5# seated on pad: x10    Other Seated Lumbar Exercises ball roll outs x10      Lumbar Exercises: Supine   Ab Set 10 reps;5 seconds    AB Set Limitations with ball squeeze      Shoulder Exercises: Seated   Flexion Strengthening;Both;20 reps;Weights    Flexion Weight (lbs) 1      Moist Heat Therapy   Number Minutes Moist Heat 20 Minutes    Moist Heat  Location Lumbar Spine;Cervical      Electrical Stimulation   Electrical Stimulation Location low back and neck    Electrical Stimulation Action Premod    Electrical Stimulation Parameters 20    Electrical Stimulation Goals Pain                       PT Short Term Goals - 02/02/21 0944       PT SHORT TERM GOAL #2   Title report a 30% reduction in the frequency and intensity of headaches    Status On-going               PT Long Term Goals - 02/02/21 1022       PT LONG TERM GOAL #1   Title Patient will be indepdnent with water program and land based HEP to promote strengthening without a signifcant increase in pain    Status On-going      PT LONG TERM GOAL #2   Title improve FOTO to > or = to 53    Status On-going      PT LONG TERM GOAL #3   Title report > or = to 60% reduction in LBP and neck pain with home and work tasks    Baseline 10%    Status On-going      PT LONG TERM GOAL #4   Title verbalize and demonstrate  body mechanics and postural modificaitons for neck and lumbar protection with home and work tasks    Status Achieved      PT LONG TERM GOAL #5   Title demonstrate bil neck rotation to > or = to 65 degrees to improve safety with driving    Status On-going      PT LONG TERM GOAL #6   Title Pt will increase LE strength to overall to > or = to 4+/5 when assessed in sitting to improve functional mobility    Status On-going      PT LONG TERM GOAL #7   Title report > or = to 70% reduction in the frequency and intensity of headaches    Status On-going                   Plan - 02/09/21 0935     Clinical Impression Statement Pt reports 10% better since the start of care.  Headaches are 10% better since the start of care.  Pt has been doing her gentle flexibility exercises and attending aquatic PT.  Session focused on gentle flexibility and strength and pt tolerated this well without increased pain.   Pt continues to be challenged by current level of exercise.  Progress has been slow due to continued soft tissue pain that limits mobility.  Pt was having a bad day today with 8/10 reported pain.  Pt will continue to benefit from skilled PT to address LBP and limited mobility.  Pt will have MRI of neck and back on 02/16/21.    Rehab Potential Good    PT Frequency 2x / week    PT Duration 8 weeks    PT Treatment/Interventions ADLs/Self Care Home Management;Cryotherapy;Electrical Stimulation;Moist Heat;Ultrasound;DME Instruction;Gait training;Stair training;Functional mobility training;Therapeutic activities;Therapeutic exercise;Neuromuscular re-education;Patient/family education;Manual techniques;Passive range of motion;Dry needling;Taping;Joint Manipulations;Aquatic Therapy    PT Next Visit Plan advance core strengthening, continue pain management and aquatics    PT Home Exercise Plan Access Code: [email or web address]    Consulted and Agree with Plan of Care Patient  Patient will benefit  from skilled therapeutic intervention in order to improve the following deficits and impairments:  Abnormal gait, Decreased endurance, Decreased mobility, Increased muscle spasms, Decreased range of motion, Decreased activity tolerance, Pain, Increased fascial restricitons, Decreased strength, Difficulty walking, Impaired flexibility  Visit Diagnosis: Pain in left hip  Chronic pain of right knee  Other abnormalities of gait and mobility  Chronic bilateral low back pain, unspecified whether sciatica present  Cervicalgia  Cramp and spasm     Problem List Patient Active Problem List   Diagnosis Date Noted   Low back pain 01/12/2021   Osteoarthritis cervical spine 01/12/2021   Pain in left foot 12/15/2020   Unilateral primary osteoarthritis, left knee 01/10/2018   Chronic pain of left knee 01/10/2018   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   Angioedema 07/15/2014   Hypokalemia 07/15/2014   Diabetes mellitus, controlled (HCC) 07/03/2012   HTN (hypertension) 07/03/2012   Rheumatoid arthritis (HCC) 07/03/2012   Current chronic use of systemic steroids 07/03/2012   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 [place], [name], [address] Phone: [phone]   Fax:  [phone]  Name: Wendy Woods MRN: [id number] Date of Birth: [birth year]

## 2021-02-13 ENCOUNTER — Other Ambulatory Visit: Payer: Self-pay

## 2021-02-13 ENCOUNTER — Ambulatory Visit
Admission: RE | Admit: 2021-02-13 | Discharge: 2021-02-13 | Disposition: A | Discharge status: Home / Self Care | Payer: Medicare Other | Source: Ambulatory Visit | Attending: Orthopaedic Surgery | Admitting: Orthopaedic Surgery

## 2021-02-13 DIAGNOSIS — M5442 Lumbago with sciatica, left side: Secondary | ICD-10-CM

## 2021-02-13 DIAGNOSIS — M542 Cervicalgia: Secondary | ICD-10-CM

## 2021-02-16 ENCOUNTER — Other Ambulatory Visit: Payer: Medicare Other

## 2021-02-22 ENCOUNTER — Ambulatory Visit (INDEPENDENT_AMBULATORY_CARE_PROVIDER_SITE_OTHER): Payer: Medicare Other | Admitting: Orthopaedic Surgery

## 2021-02-22 ENCOUNTER — Encounter: Payer: Self-pay | Admitting: Orthopaedic Surgery

## 2021-02-22 ENCOUNTER — Other Ambulatory Visit: Payer: Self-pay

## 2021-02-22 VITALS — Ht 64.0 in | Wt 181.0 lb

## 2021-02-22 DIAGNOSIS — M51369 Other intervertebral disc degeneration, lumbar region without mention of lumbar back pain or lower extremity pain: Secondary | ICD-10-CM

## 2021-02-22 DIAGNOSIS — M79604 Pain in right leg: Secondary | ICD-10-CM | POA: Diagnosis not present

## 2021-02-22 DIAGNOSIS — M5136 Other intervertebral disc degeneration, lumbar region: Secondary | ICD-10-CM | POA: Diagnosis not present

## 2021-02-22 DIAGNOSIS — M79605 Pain in left leg: Secondary | ICD-10-CM | POA: Diagnosis not present

## 2021-02-22 DIAGNOSIS — M5030 Other cervical disc degeneration, unspecified cervical region: Secondary | ICD-10-CM
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
## 2021-02-24 ENCOUNTER — Encounter: Payer: Self-pay | Admitting: Orthopaedic Surgery

## 2021-02-28 ENCOUNTER — Other Ambulatory Visit: Payer: Self-pay

## 2021-02-28 ENCOUNTER — Ambulatory Visit (HOSPITAL_COMMUNITY)
Admission: RE | Admit: 2021-02-28 | Discharge: 2021-02-28 | Disposition: A | Discharge status: Home / Self Care | Payer: Medicare Other | Source: Ambulatory Visit | Attending: Rheumatology | Admitting: Rheumatology

## 2021-02-28 DIAGNOSIS — M0579 Rheumatoid arthritis with rheumatoid factor of multiple sites without organ or systems involvement: Secondary | ICD-10-CM | POA: Diagnosis present

## 2021-02-28 MED ORDER — SODIUM CHLORIDE 0.9 % IV SOLN
5.0000 mg/kg | INTRAVENOUS | Status: DC
  Administered 2021-02-28: 400 mg via INTRAVENOUS
  Filled 2021-02-28: qty 40

## 2021-02-28 MED ORDER — ACETAMINOPHEN 325 MG PO TABS: 650.0000 mg | ORAL_TABLET | ORAL | Status: DC

## 2021-02-28 MED ORDER — DIPHENHYDRAMINE HCL 25 MG PO CAPS: 25.0000 mg | ORAL_CAPSULE | ORAL | Status: DC

## 2021-03-01 ENCOUNTER — Other Ambulatory Visit: Payer: Self-pay

## 2021-03-01 DIAGNOSIS — M79605 Pain in left leg: Secondary | ICD-10-CM

## 2021-03-01 DIAGNOSIS — M79604 Pain in right leg: Secondary | ICD-10-CM

## 2021-03-03 ENCOUNTER — Other Ambulatory Visit: Payer: Self-pay

## 2021-03-03 ENCOUNTER — Ambulatory Visit: Payer: Medicare Other | Attending: Orthopaedic Surgery

## 2021-03-03 DIAGNOSIS — M5030 Other cervical disc degeneration, unspecified cervical region: Secondary | ICD-10-CM | POA: Diagnosis present

## 2021-03-03 DIAGNOSIS — M5136 Other intervertebral disc degeneration, lumbar region: Secondary | ICD-10-CM | POA: Insufficient documentation

## 2021-03-03 DIAGNOSIS — G8929 Other chronic pain: Secondary | ICD-10-CM | POA: Diagnosis present

## 2021-03-03 DIAGNOSIS — R252 Cramp and spasm: Secondary | ICD-10-CM | POA: Diagnosis present

## 2021-03-03 DIAGNOSIS — M25561 Pain in right knee: Secondary | ICD-10-CM | POA: Insufficient documentation

## 2021-03-03 DIAGNOSIS — R2689 Other abnormalities of gait and mobility: Secondary | ICD-10-CM | POA: Diagnosis present

## 2021-03-03 DIAGNOSIS — M545 Low back pain, unspecified: Secondary | ICD-10-CM | POA: Insufficient documentation

## 2021-03-03 DIAGNOSIS — M25552 Pain in left hip: Secondary | ICD-10-CM | POA: Insufficient documentation

## 2021-03-03 DIAGNOSIS — M6281 Muscle weakness (generalized): Secondary | ICD-10-CM | POA: Diagnosis present

## 2021-03-03 DIAGNOSIS — M542 Cervicalgia: Secondary | ICD-10-CM | POA: Diagnosis present
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
## 2021-03-21 ENCOUNTER — Other Ambulatory Visit: Payer: Self-pay

## 2021-03-21 ENCOUNTER — Ambulatory Visit: Payer: Medicare Other

## 2021-03-21 DIAGNOSIS — M25561 Pain in right knee: Secondary | ICD-10-CM | POA: Diagnosis not present

## 2021-03-21 DIAGNOSIS — M6281 Muscle weakness (generalized): Secondary | ICD-10-CM

## 2021-03-21 DIAGNOSIS — G8929 Other chronic pain: Secondary | ICD-10-CM

## 2021-03-21 DIAGNOSIS — M25552 Pain in left hip: Secondary | ICD-10-CM

## 2021-03-21 DIAGNOSIS — M542 Cervicalgia: Secondary | ICD-10-CM

## 2021-03-21 DIAGNOSIS — R2689 Other abnormalities of gait and mobility: Secondary | ICD-10-CM

## 2021-03-21 DIAGNOSIS — M545 Low back pain, unspecified: Secondary | ICD-10-CM

## 2021-03-21 DIAGNOSIS — R252 Cramp and spasm: Secondary | ICD-10-CM
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
## 2021-03-28 ENCOUNTER — Ambulatory Visit: Payer: Medicare Other

## 2021-03-28 ENCOUNTER — Other Ambulatory Visit: Payer: Self-pay

## 2021-03-28 DIAGNOSIS — M25561 Pain in right knee: Secondary | ICD-10-CM | POA: Diagnosis not present

## 2021-03-28 DIAGNOSIS — M25552 Pain in left hip: Secondary | ICD-10-CM

## 2021-03-28 DIAGNOSIS — M6281 Muscle weakness (generalized): Secondary | ICD-10-CM

## 2021-03-28 DIAGNOSIS — G8929 Other chronic pain: Secondary | ICD-10-CM

## 2021-03-28 DIAGNOSIS — M542 Cervicalgia: Secondary | ICD-10-CM

## 2021-03-28 DIAGNOSIS — R252 Cramp and spasm: Secondary | ICD-10-CM

## 2021-03-28 DIAGNOSIS — R2689 Other abnormalities of gait and mobility: Secondary | ICD-10-CM
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
## 2021-03-29 ENCOUNTER — Encounter: Payer: Self-pay | Admitting: Orthopaedic Surgery

## 2021-03-29 ENCOUNTER — Ambulatory Visit (INDEPENDENT_AMBULATORY_CARE_PROVIDER_SITE_OTHER): Payer: Medicare Other | Admitting: Orthopaedic Surgery

## 2021-03-29 DIAGNOSIS — M79604 Pain in right leg: Secondary | ICD-10-CM | POA: Diagnosis not present

## 2021-03-29 DIAGNOSIS — M79605 Pain in left leg: Secondary | ICD-10-CM
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
## 2021-03-30 ENCOUNTER — Encounter (HOSPITAL_BASED_OUTPATIENT_CLINIC_OR_DEPARTMENT_OTHER): Payer: Self-pay | Admitting: Physical Therapy

## 2021-03-30 ENCOUNTER — Other Ambulatory Visit: Payer: Self-pay

## 2021-03-30 ENCOUNTER — Ambulatory Visit (HOSPITAL_BASED_OUTPATIENT_CLINIC_OR_DEPARTMENT_OTHER): Payer: Medicare Other | Attending: Rheumatology | Admitting: Physical Therapy

## 2021-03-30 DIAGNOSIS — M25561 Pain in right knee: Secondary | ICD-10-CM | POA: Diagnosis present

## 2021-03-30 DIAGNOSIS — G8929 Other chronic pain: Secondary | ICD-10-CM | POA: Diagnosis present

## 2021-03-30 DIAGNOSIS — M6281 Muscle weakness (generalized): Secondary | ICD-10-CM | POA: Insufficient documentation

## 2021-03-30 DIAGNOSIS — M25552 Pain in left hip: Secondary | ICD-10-CM | POA: Insufficient documentation

## 2021-03-30 DIAGNOSIS — R2689 Other abnormalities of gait and mobility: Secondary | ICD-10-CM | POA: Insufficient documentation

## 2021-03-30 DIAGNOSIS — M545 Low back pain, unspecified: Secondary | ICD-10-CM | POA: Insufficient documentation

## 2021-03-30 NOTE — Therapy (Signed)
[facility] [address] [place], [name], [address] Phone: [phone]   Fax:  [phone]  Physical Therapy Treatment  Patient Details  Name: Wendy Woods MRN: [id number] Date of Birth: April 29, 1961 Referring Provider (PT): Norlene Campbell, MD   Encounter Date: 03/30/2021   PT End of Session - 03/30/21 0910     Visit Number 17    Date for PT Re-Evaluation 04/28/21    Authorization Type UHC medicare 10 visit progress note    Progress Note Due on Visit 20    PT Start Time 0820    PT Stop Time 0905    PT Time Calculation (min) 45 min    Equipment Utilized During Treatment Other (comment)    Activity Tolerance Patient tolerated treatment well    Behavior During Therapy [facility] for tasks assessed/performed             Past Medical History:  Diagnosis Date   DDD (degenerative disc disease), cervical 03/22/2016   DDD (degenerative disc disease), lumbar 03/22/2016   GERD (gastroesophageal reflux disease)    Hyperlipidemia    Hypertension    Rheumatoid arthritis (HCC)    Rheumatoid arthritis(714.0)    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   Subjective Assessment - 03/30/21 1001     Subjective I guess I'm feeling better.  Have an appointment with neurologist    Currently in Pain? Yes    Pain Score 7     Pain Location Back    Pain Orientation Right;Left;Lower    Pain Descriptors / Indicators Aching;Sore    Pain Type Chronic pain    Pain Score 5    Pain Location Neck    Pain Orientation Right;Left    Pain Descriptors / Indicators Aching    Pain Type Acute pain    Pain Onset More than a month ago                                           PT Short Term Goals - 02/09/21 1123       PT SHORT TERM GOAL #2   Baseline continues to be daily.  Pt reports they have improved but only about 10%. 02/09/21               PT Long Term Goals - 03/21/21 0851       PT LONG TERM GOAL #1   Title Patient will be indepdnent with water program and land based HEP to promote strengthening without a signifcant increase in pain    Status On-going      PT LONG TERM GOAL #3   Title report > or = to 80% reduction in LBP and neck pain with home and work tasks    Baseline 60% today    Status On-going      PT LONG TERM GOAL #4   Title verbalize and demonstrate body mechanics and postural modificaitons for neck and lumbar protection with home and work tasks    Status Achieved  Pt seen for aquatic therapy today.  Treatment took place in water 3.25-4.8 ft in depth at the [facility] pool. Temp of water was 91.  Pt entered/exited the pool via stairs step to pattern independently with bilat rail.  Warm up: water walking 8 widths forward, back and sideways. Exercises: Pilates- modified plank Lat pull downs 2 x 5 using blue then yellow noodle with assist for positioning and movement. Cues for core stabilization. Plank VC, demonstration and manual assist for position.  Pt able to gain good engagement of core with assistance holding position.  Unable to gain or maintain position indep. Sitting balance on noodle Requires min assist to cga. Pt requires buoyancy for support and to offload joints with strengthening exercises. Viscosity of the water is needed for resistance of strengthening; water current perturbations provides challenge to standing balance unsupported, requiring increased core activation.         Plan - 03/30/21 1002     Clinical Impression Statement Spent time explaining results of MRI for pt to better understand. Introduced pilates for  core strengtheing to stabilize curve. Pt with core weakness has difficuty gaining positions along with some apprehensiveness in water. With assistance and modifications pt able to engage core muscles for benefit.  Will continue to focus on core strength/proximal strength for improve alignment and pain relief    Stability/Clinical Decision Making Evolving/Moderate complexity    Clinical Decision Making Moderate    Rehab Potential Good    PT Frequency 2x / week    PT Duration 8 weeks    PT Treatment/Interventions ADLs/Self Care Home Management;Cryotherapy;Electrical Stimulation;Moist Heat;Ultrasound;DME Instruction;Gait training;Stair training;Functional mobility training;Therapeutic activities;Therapeutic exercise;Neuromuscular re-education;Patient/family education;Manual techniques;Passive range of motion;Dry needling;Taping;Joint Manipulations;Aquatic Therapy    PT Next Visit Plan Pilates    PT Home Exercise Plan Access Code: [email or web address]             Patient will benefit from skilled therapeutic intervention in order to improve the following deficits and impairments:  Abnormal gait, Decreased endurance, Decreased mobility, Increased muscle spasms, Decreased range of motion, Decreased activity tolerance, Pain, Increased fascial restricitons, Decreased strength, Difficulty walking, Impaired flexibility  Visit Diagnosis: Chronic pain of right knee  Pain in left hip  Other abnormalities of gait and mobility  Chronic bilateral low back pain, unspecified whether sciatica present  Muscle weakness (generalized)     Problem List Patient Active Problem List   Diagnosis Date Noted   Leg pain, bilateral 02/22/2021   Low back pain 01/12/2021   Osteoarthritis cervical spine 01/12/2021   Pain in left foot 12/15/2020   Unilateral primary osteoarthritis, left knee 01/10/2018   Chronic pain of left knee 01/10/2018   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   Angioedema 07/15/2014   Hypokalemia 07/15/2014    Diabetes mellitus, controlled (HCC) 07/03/2012   HTN (hypertension) 07/03/2012   Rheumatoid arthritis (HCC) 07/03/2012   Current chronic use of systemic steroids 07/03/2012   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

## 2021-04-04 ENCOUNTER — Other Ambulatory Visit: Payer: Self-pay

## 2021-04-04 ENCOUNTER — Ambulatory Visit (HOSPITAL_COMMUNITY)
Admission: RE | Admit: 2021-04-04 | Discharge: 2021-04-04 | Disposition: A | Discharge status: Home / Self Care | Payer: Medicare Other | Source: Ambulatory Visit | Attending: Rheumatology | Admitting: Rheumatology

## 2021-04-04 ENCOUNTER — Ambulatory Visit: Payer: Medicare Other | Attending: Orthopaedic Surgery

## 2021-04-04 DIAGNOSIS — Z79899 Other long term (current) drug therapy: Secondary | ICD-10-CM

## 2021-04-04 DIAGNOSIS — M545 Low back pain, unspecified: Secondary | ICD-10-CM | POA: Insufficient documentation

## 2021-04-04 DIAGNOSIS — M542 Cervicalgia: Secondary | ICD-10-CM | POA: Insufficient documentation

## 2021-04-04 DIAGNOSIS — G8929 Other chronic pain: Secondary | ICD-10-CM | POA: Insufficient documentation

## 2021-04-04 DIAGNOSIS — M25561 Pain in right knee: Secondary | ICD-10-CM | POA: Diagnosis not present

## 2021-04-04 DIAGNOSIS — M6281 Muscle weakness (generalized): Secondary | ICD-10-CM | POA: Diagnosis present

## 2021-04-04 DIAGNOSIS — M25552 Pain in left hip: Secondary | ICD-10-CM | POA: Insufficient documentation

## 2021-04-04 DIAGNOSIS — M0579 Rheumatoid arthritis with rheumatoid factor of multiple sites without organ or systems involvement: Secondary | ICD-10-CM | POA: Diagnosis present

## 2021-04-04 DIAGNOSIS — R2689 Other abnormalities of gait and mobility: Secondary | ICD-10-CM | POA: Insufficient documentation

## 2021-04-04 DIAGNOSIS — R252 Cramp and spasm: Secondary | ICD-10-CM | POA: Insufficient documentation

## 2021-04-04 LAB — COMPREHENSIVE METABOLIC PANEL
ALT: 23 U/L (ref 0–44)
AST: 24 U/L (ref 15–41)
Albumin: 3.9 g/dL (ref 3.5–5.0)
Alkaline Phosphatase: 69 U/L (ref 38–126)
Anion gap: 10 (ref 5–15)
BUN: 17 mg/dL (ref 6–20)
CO2: 28 mmol/L (ref 22–32)
Calcium: 9.8 mg/dL (ref 8.9–10.3)
Chloride: 92 mmol/L — ABNORMAL LOW (ref 98–111)
Creatinine, Ser: 0.98 mg/dL (ref 0.44–1.00)
GFR, Estimated: >60 mL/min (ref >60)
Glucose, Bld: 373 mg/dL — ABNORMAL HIGH (ref 70–99)
Potassium: 3.7 mmol/L (ref 3.5–5.1)
Sodium: 130 mmol/L — ABNORMAL LOW (ref 135–145)
Total Bilirubin: 0.6 mg/dL (ref 0.3–1.2)
Total Protein: 8.1 g/dL (ref 6.5–8.1)

## 2021-04-04 LAB — CBC WITH DIFFERENTIAL/PLATELET
Abs Immature Granulocytes: 0.02 10*3/uL (ref 0.00–0.07)
Basophils Absolute: 0 10*3/uL (ref 0.0–0.1)
Basophils Relative: 1 %
Eosinophils Absolute: 0.2 10*3/uL (ref 0.0–0.5)
Eosinophils Relative: 3 %
HCT: 40.9 % (ref 36.0–46.0)
Hemoglobin: 14.1 g/dL (ref 12.0–15.0)
Immature Granulocytes: 0 %
Lymphocytes Relative: 43 %
Lymphs Abs: 2.6 10*3/uL (ref 0.7–4.0)
MCH: 30 pg (ref 26.0–34.0)
MCHC: 34.5 g/dL (ref 30.0–36.0)
MCV: 87 fL (ref 80.0–100.0)
Monocytes Absolute: 0.4 10*3/uL (ref 0.1–1.0)
Monocytes Relative: 7 %
Neutro Abs: 2.8 10*3/uL (ref 1.7–7.7)
Neutrophils Relative %: 46 %
Platelets: 385 10*3/uL (ref 150–400)
RBC: 4.7 MIL/uL (ref 3.87–5.11)
RDW: 12.3 % (ref 11.5–15.5)
WBC: 6.1 10*3/uL (ref 4.0–10.5)
nRBC: 0 % (ref 0.0–0.2)

## 2021-04-04 MED ORDER — SODIUM CHLORIDE 0.9 % IV SOLN
5.0000 mg/kg | INTRAVENOUS | Status: DC
  Administered 2021-04-04: 400 mg via INTRAVENOUS
  Filled 2021-04-04: qty 40

## 2021-04-04 MED ORDER — ACETAMINOPHEN 325 MG PO TABS: 650.0000 mg | ORAL_TABLET | ORAL | Status: DC

## 2021-04-04 MED ORDER — DIPHENHYDRAMINE HCL 25 MG PO CAPS: 25.0000 mg | ORAL_CAPSULE | ORAL | Status: DC

## 2021-04-04 NOTE — Progress Notes (Signed)
Glucose is elevated.  Please notify patient and also fax results to her PCP.  Patient should contact her PCP regarding elevated glucose.  Sodium and chloride are low because of elevated glucose for CBC is normal.
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
## 2021-04-06 ENCOUNTER — Encounter (HOSPITAL_BASED_OUTPATIENT_CLINIC_OR_DEPARTMENT_OTHER): Payer: Self-pay | Admitting: Physical Therapy

## 2021-04-06 ENCOUNTER — Other Ambulatory Visit: Payer: Self-pay

## 2021-04-06 ENCOUNTER — Ambulatory Visit (HOSPITAL_BASED_OUTPATIENT_CLINIC_OR_DEPARTMENT_OTHER): Payer: Medicare Other | Attending: Rheumatology | Admitting: Physical Therapy

## 2021-04-06 DIAGNOSIS — R2689 Other abnormalities of gait and mobility: Secondary | ICD-10-CM | POA: Diagnosis present

## 2021-04-06 DIAGNOSIS — M6281 Muscle weakness (generalized): Secondary | ICD-10-CM | POA: Insufficient documentation

## 2021-04-06 DIAGNOSIS — M542 Cervicalgia: Secondary | ICD-10-CM | POA: Diagnosis present

## 2021-04-06 DIAGNOSIS — M545 Low back pain, unspecified: Secondary | ICD-10-CM | POA: Diagnosis present

## 2021-04-06 DIAGNOSIS — R252 Cramp and spasm: Secondary | ICD-10-CM | POA: Diagnosis present

## 2021-04-06 DIAGNOSIS — G8929 Other chronic pain: Secondary | ICD-10-CM | POA: Diagnosis present

## 2021-04-06 NOTE — Therapy (Signed)
[facility] [address] [place], [name], [address] Phone: [phone]   Fax:  [phone]  Physical Therapy Treatment  Patient Details  Name: Wendy Woods MRN: [id number] Date of Birth: Nov 16, 1960 Referring Provider (PT): Norlene Campbell, MD   Encounter Date: 04/06/2021   PT End of Session - 04/06/21 0906     Visit Number 19    Date for PT Re-Evaluation 04/28/21    Progress Note Due on Visit 20    PT Start Time 0815    PT Stop Time 0900    PT Time Calculation (min) 45 min    Activity Tolerance Patient tolerated treatment well    Behavior During Therapy [facility] for tasks assessed/performed             Past Medical History:  Diagnosis Date   DDD (degenerative disc disease), cervical 03/22/2016   DDD (degenerative disc disease), lumbar 03/22/2016   GERD (gastroesophageal reflux disease)    Hyperlipidemia    Hypertension    Rheumatoid arthritis (HCC)    Rheumatoid arthritis(714.0)    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   Subjective Assessment - 04/06/21 0823     Subjective I'm tired still.  Did work lat night.  Hurting mostly in my back, I can tell my core is getting a little stronger"    Pain Location Back                                          PT Short Term Goals - 02/09/21 1123       PT SHORT TERM GOAL #2   Baseline continues to be daily.  Pt reports they have improved but only about 10%. 02/09/21               PT Long Term Goals - 03/21/21 0851       PT LONG TERM GOAL #1   Title Patient will be indepdnent with water program and land based HEP to  promote strengthening without a signifcant increase in pain    Status On-going      PT LONG TERM GOAL #3   Title report > or = to 80% reduction in LBP and neck pain with home and work tasks    Baseline 60% today    Status On-going      PT LONG TERM GOAL #4   Title verbalize and demonstrate body mechanics and postural modificaitons for neck and lumbar protection with home and work tasks    Status Achieved             Pt seen for aquatic therapy today.  Treatment took place in water 3.25-4.8 ft in depth at the [facility] pool. Temp of water was 91.  Pt entered/exited the pool via stairs step to pattern independently with bilat rail.   Warm up: water walking 8 widths forward, back and sideways. Exercises: Seated cylcing 3 x 30 revolutions Flutter kicking 3 x 30 reps Core strengthening/Balance Sitting on noodle yellow   Pilates- modified plank holding  up to 30 sec x 3-4 with TC Lat pull downs 2 x 5 using blue squoodle with assist for positioning and movement. Tactile and verbalCues for core stabilization. Plank VC, demonstration and manual assist for position.  Pt able to gain good engagement of core with assistance holding position.  Unable to gain or maintain position indep. Sitting balance on noodle Requires min assist to cga. Pt requires buoyancy for support and to offload joints with strengthening exercises. Viscosity of the water is needed for resistance of strengthening; water current perturbations provides challenge to standing balance unsupported, requiring increased core activation.       Plan - 04/06/21 0825     Clinical Impression Statement Pt reporting feeling as though her posture is better.  She is tolerating grocery shopping better as well lifting small objects. Overall pain is improved and has return to one of her jobs. She is waiting on appt for neurologist. Continue to progress core strengthening for decreased sx and improved balance. She is unable to  gain sitting balance sitting on noodle today.    Stability/Clinical Decision Making Evolving/Moderate complexity    Clinical Decision Making Moderate    Rehab Potential Good    PT Frequency 2x / week    PT Duration 8 weeks    PT Treatment/Interventions ADLs/Self Care Home Management;Cryotherapy;Electrical Stimulation;Moist Heat;Ultrasound;DME Instruction;Gait training;Stair training;Functional mobility training;Therapeutic activities;Therapeutic exercise;Neuromuscular re-education;Patient/family education;Manual techniques;Passive range of motion;Dry needling;Taping;Joint Manipulations;Aquatic Therapy    PT Next Visit Plan activity progression, aquatics, flexiblity    PT Home Exercise Plan Access Code: [email or web address]             Patient will benefit from skilled therapeutic intervention in order to improve the following deficits and impairments:  Abnormal gait, Decreased endurance, Decreased mobility, Increased muscle spasms, Decreased range of motion, Decreased activity tolerance, Pain, Increased fascial restricitons, Decreased strength, Difficulty walking, Impaired flexibility  Visit Diagnosis: Other abnormalities of gait and mobility  Muscle weakness (generalized)  Chronic bilateral low back pain, unspecified whether sciatica present     Problem List Patient Active Problem List   Diagnosis Date Noted   Leg pain, bilateral 02/22/2021   Low back pain 01/12/2021   Osteoarthritis cervical spine 01/12/2021   Pain in left foot 12/15/2020   Unilateral primary osteoarthritis, left knee 01/10/2018   Chronic pain of left knee 01/10/2018   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   Angioedema 07/15/2014   Hypokalemia 07/15/2014   Diabetes mellitus, controlled (HCC) 07/03/2012   HTN (hypertension) 07/03/2012   Rheumatoid arthritis (HCC) 07/03/2012   Current chronic use of systemic steroids 07/03/2012   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

## 2021-04-11 ENCOUNTER — Other Ambulatory Visit: Payer: Self-pay

## 2021-04-11 ENCOUNTER — Ambulatory Visit: Payer: Medicare Other

## 2021-04-11 DIAGNOSIS — M6281 Muscle weakness (generalized): Secondary | ICD-10-CM

## 2021-04-11 DIAGNOSIS — G8929 Other chronic pain: Secondary | ICD-10-CM

## 2021-04-11 DIAGNOSIS — M25561 Pain in right knee: Secondary | ICD-10-CM | POA: Diagnosis not present

## 2021-04-11 DIAGNOSIS — M542 Cervicalgia: Secondary | ICD-10-CM

## 2021-04-11 DIAGNOSIS — M545 Low back pain, unspecified: Secondary | ICD-10-CM

## 2021-04-11 DIAGNOSIS — R252 Cramp and spasm: Secondary | ICD-10-CM
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
## 2021-04-13 ENCOUNTER — Other Ambulatory Visit: Payer: Self-pay

## 2021-04-13 ENCOUNTER — Ambulatory Visit (HOSPITAL_BASED_OUTPATIENT_CLINIC_OR_DEPARTMENT_OTHER): Payer: Medicare Other | Admitting: Physical Therapy

## 2021-04-13 ENCOUNTER — Encounter (HOSPITAL_BASED_OUTPATIENT_CLINIC_OR_DEPARTMENT_OTHER): Payer: Self-pay | Admitting: Physical Therapy

## 2021-04-13 DIAGNOSIS — M6281 Muscle weakness (generalized): Secondary | ICD-10-CM

## 2021-04-13 DIAGNOSIS — G8929 Other chronic pain: Secondary | ICD-10-CM

## 2021-04-13 DIAGNOSIS — R2689 Other abnormalities of gait and mobility: Secondary | ICD-10-CM | POA: Diagnosis not present

## 2021-04-13 DIAGNOSIS — M545 Low back pain, unspecified: Secondary | ICD-10-CM

## 2021-04-13 DIAGNOSIS — R252 Cramp and spasm: Secondary | ICD-10-CM

## 2021-04-13 DIAGNOSIS — M542 Cervicalgia: Secondary | ICD-10-CM

## 2021-04-13 NOTE — Therapy (Signed)
[facility] [address] [place], [name], [address] Phone: [phone]   Fax:  [phone]  Physical Therapy Treatment  Patient Details  Name: Wendy Woods MRN: [id number] Date of Birth: [birth year] Referring Provider (PT): Norlene Campbell, MD   Encounter Date: 04/13/2021   PT End of Session - 04/13/21 0829     Visit Number 21    Number of Visits 12    Date for PT Re-Evaluation 04/28/21    Authorization Type UHC medicare 10 visit progress note    PT Start Time 0820    PT Stop Time 0859    PT Time Calculation (min) 39 min             Past Medical History:  Diagnosis Date   DDD (degenerative disc disease), cervical 03/22/2016   DDD (degenerative disc disease), lumbar 03/22/2016   GERD (gastroesophageal reflux disease)    Hyperlipidemia    Hypertension    Rheumatoid arthritis (HCC)    Rheumatoid arthritis(714.0)    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   Subjective Assessment - 04/13/21 0824     Subjective "Low ache lb , knee hurts more, didn't sleep well"    Currently in Pain? Yes    Pain Score 2     Pain Location Back    Pain Orientation Right;Left;Lower    Pain Descriptors / Indicators Sore    Pain Type Chronic pain    Pain Onset More than a month ago    Pain Frequency Constant    Pain Score 2    Pain Location Neck    Pain Orientation Right;Left    Pain Descriptors / Indicators Aching    Pain Type Acute pain    Pain Onset More than a month ago    Pain Frequency Constant    Pain Score 1    Pain Location Foot    Pain Orientation Left    Pain Descriptors / Indicators Sore    Pain Type  Acute pain    Pain Onset 1 to 4 weeks ago                                          PT Short Term Goals - 02/09/21 1123       PT SHORT TERM GOAL #2   Baseline continues to be daily.  Pt reports they have improved but only about 10%. 02/09/21               PT Long Term Goals - 04/11/21 0946       PT LONG TERM GOAL #1   Title Patient will be indepdnent with water program and land based HEP to promote strengthening without a signifcant increase in pain    Status On-going      PT LONG TERM GOAL #2   Title improve FOTO to > or = to 53    Baseline 53    Status Achieved      PT LONG TERM GOAL #3   Title report > or =  to 80% reduction in LBP and neck pain with home and work tasks    Baseline 50-60%    Status On-going      PT LONG TERM GOAL #4   Title verbalize and demonstrate body mechanics and postural modificaitons for neck and lumbar protection with home and work tasks    Status Achieved      PT LONG TERM GOAL #5   Title demonstrate bil neck rotation to > or = to 65 degrees to improve safety with driving    Baseline 55 degrees      PT LONG TERM GOAL #6   Title Pt will increase LE strength to overall to > or = to 4+/5 when assessed in sitting to improve functional mobility    Status On-going      PT LONG TERM GOAL #7   Title report > or = to 70% reduction in the frequency and intensity of headaches    Baseline 60% better    Status On-going            Pt seen for aquatic therapy today.  Treatment took place in water 3.25-4.8 ft in depth at the [facility] pool. Temp of water was 91.  Pt entered/exited the pool via stairs step to pattern independently with bilat rail.   Warm up: water walking 8 widths forward, back and sideways. Exercises: Long leg proximal strengthening supported by hand buoys: hip flex;extension;add/abd 2 x10 Slow marching 2x12 UE 1 foam hand buoy shld flex; add/abd and horizontal abd x12 Walking hand  buoys submerged x 4 widths for core engagement  Seated cylcing x 30 revolutions Flutter kicking  x 30 reps Core strengthening/Balance -Pilates lat pull down x 10 using blue noodle. Cues for tightened core.   Pt requires buoyancy for support and to offload joints with strengthening exercises. Viscosity of the water is needed for resistance of strengthening; water current perturbations provides challenge to standing balance unsupported, requiring increased core activation.       Plan - 04/13/21 0901     Clinical Impression Statement Decrease in pain today overall.  She completes all exercises without limitations.  Improving core strength and control with execution of core pilates exercise. Fodused on proximal strength.             Patient will benefit from skilled therapeutic intervention in order to improve the following deficits and impairments:     Visit Diagnosis: Muscle weakness (generalized)  Chronic bilateral low back pain, unspecified whether sciatica present  Cramp and spasm  Cervicalgia     Problem List Patient Active Problem List   Diagnosis Date Noted   Leg pain, bilateral 02/22/2021   Low back pain 01/12/2021   Osteoarthritis cervical spine 01/12/2021   Pain in left foot 12/15/2020   Unilateral primary osteoarthritis, left knee 01/10/2018   Chronic pain of left knee 01/10/2018   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   Angioedema 07/15/2014   Hypokalemia 07/15/2014   Diabetes mellitus, controlled (HCC) 07/03/2012   HTN (hypertension) 07/03/2012   Rheumatoid arthritis (HCC) 07/03/2012   Current chronic use of systemic steroids 07/03/2012   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 [place], [name], [address] Phone: [phone]   Fax:  [phone]  Name: DARCEL ZICK MRN: [id number] Date of Birth: 11-20-1960

## 2021-04-19 ENCOUNTER — Ambulatory Visit: Payer: Medicare Other

## 2021-04-19 ENCOUNTER — Ambulatory Visit (INDEPENDENT_AMBULATORY_CARE_PROVIDER_SITE_OTHER): Payer: Medicare Other | Admitting: Orthopaedic Surgery

## 2021-04-19 ENCOUNTER — Encounter: Payer: Self-pay | Admitting: Neurology

## 2021-04-19 ENCOUNTER — Other Ambulatory Visit: Payer: Self-pay

## 2021-04-19 ENCOUNTER — Encounter: Payer: Self-pay | Admitting: Orthopaedic Surgery

## 2021-04-19 DIAGNOSIS — M25561 Pain in right knee: Secondary | ICD-10-CM | POA: Diagnosis not present

## 2021-04-19 DIAGNOSIS — M545 Low back pain, unspecified: Secondary | ICD-10-CM

## 2021-04-19 DIAGNOSIS — R252 Cramp and spasm: Secondary | ICD-10-CM

## 2021-04-19 DIAGNOSIS — G8929 Other chronic pain: Secondary | ICD-10-CM

## 2021-04-19 DIAGNOSIS — M542 Cervicalgia: Secondary | ICD-10-CM

## 2021-04-19 DIAGNOSIS — M6281 Muscle weakness (generalized): Secondary | ICD-10-CM
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
## 2021-04-20 ENCOUNTER — Encounter (HOSPITAL_BASED_OUTPATIENT_CLINIC_OR_DEPARTMENT_OTHER): Payer: Self-pay | Admitting: Physical Therapy

## 2021-04-20 ENCOUNTER — Ambulatory Visit (HOSPITAL_BASED_OUTPATIENT_CLINIC_OR_DEPARTMENT_OTHER): Payer: Medicare Other | Admitting: Physical Therapy

## 2021-04-20 DIAGNOSIS — M6281 Muscle weakness (generalized): Secondary | ICD-10-CM

## 2021-04-20 DIAGNOSIS — M542 Cervicalgia: Secondary | ICD-10-CM

## 2021-04-20 DIAGNOSIS — R2689 Other abnormalities of gait and mobility: Secondary | ICD-10-CM | POA: Diagnosis not present

## 2021-04-20 DIAGNOSIS — M545 Low back pain, unspecified: Secondary | ICD-10-CM

## 2021-04-20 DIAGNOSIS — R252 Cramp and spasm: Secondary | ICD-10-CM

## 2021-04-20 NOTE — Therapy (Signed)
[facility] [address] [place], [name], [address] Phone: [phone]   Fax:  [phone]  Physical Therapy Treatment  Patient Details  Name: Wendy Woods MRN: [id number] Date of Birth: March 26, 1961 Referring Provider (PT): Norlene Campbell, MD   Encounter Date: 04/20/2021   PT End of Session - 04/20/21 0813     Visit Number 23    Date for PT Re-Evaluation 04/28/21    Authorization Type UHC medicare 10 visit progress note    Progress Note Due on Visit 30    PT Start Time 0815    PT Stop Time 0900    PT Time Calculation (min) 45 min    Activity Tolerance Patient tolerated treatment well    Behavior During Therapy [facility] for tasks assessed/performed             Past Medical History:  Diagnosis Date   DDD (degenerative disc disease), cervical 03/22/2016   DDD (degenerative disc disease), lumbar 03/22/2016   GERD (gastroesophageal reflux disease)    Hyperlipidemia    Hypertension    Rheumatoid arthritis (HCC)    Rheumatoid arthritis(714.0)    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   Subjective Assessment - 04/20/21 0820     Subjective "[employer] has not yet oked me to going back to work yet.  Foot hasn't been bothering me much because I haven't been standing much, wear the inserts"    Pain Score 5     Pain Location Back    Pain Orientation Right;Left    Pain Descriptors / Indicators Sore    Pain Onset More than a month ago                                          PT Short Term Goals - 02/09/21 1123       PT SHORT TERM GOAL #2   Baseline continues to be daily.  Pt  reports they have improved but only about 10%. 02/09/21               PT Long Term Goals - 04/11/21 0946       PT LONG TERM GOAL #1   Title Patient will be indepdnent with water program and land based HEP to promote strengthening without a signifcant increase in pain    Status On-going      PT LONG TERM GOAL #2   Title improve FOTO to > or = to 53    Baseline 53    Status Achieved      PT LONG TERM GOAL #3   Title report > or = to 80% reduction in LBP and neck pain with home and work tasks    Baseline 50-60%    Status On-going      PT LONG TERM GOAL #4   Title verbalize and demonstrate body mechanics and postural modificaitons for neck and lumbar protection with home and work tasks    Status Achieved      PT LONG TERM GOAL #5   Title demonstrate  bil neck rotation to > or = to 65 degrees to improve safety with driving    Baseline 55 degrees      PT LONG TERM GOAL #6   Title Pt will increase LE strength to overall to > or = to 4+/5 when assessed in sitting to improve functional mobility    Status On-going      PT LONG TERM GOAL #7   Title report > or = to 70% reduction in the frequency and intensity of headaches    Baseline 60% better    Status On-going            Pt seen for aquatic therapy today.  Treatment took place in water 3.25-4.8 ft in depth at the [facility] pool. Temp of water was 91.  Pt entered/exited the pool via stairs step to pattern independently with bilat rail.   Warm up: water walking 8 widths forward, back and sideways. Exercises: Long leg proximal strengthening without UE support in 3 ft: hip flex;extension;add/abd 2 x10 Slow marching x20 Mini squats bottom step 2x10 UE 1 foam hand buoy shld flex; add/abd and horizontal abd x12 Walking hand buoys submerged x 4 widths for core engagement fwd and retro Core rotation 2x10 Hand buoy push down 2x10 for core engagment Flutter kicking  2x 30 reps Core strengthening/Balance -Pilates  lat pull down x 12 using blue noodle. Cues for tightened core.   Pt requires buoyancy for support and to offload joints with strengthening exercises. Viscosity of the water is needed for resistance of strengthening; water current perturbations provides challenge to standing balance unsupported, requiring increased core activation.       Plan - 04/20/21 0840     Clinical Impression Statement Pt with relaxed demeanor and posture indicating decreased in guarding/pain. Improved toleration to exercises with less discomfort and improved movement.  Pt completes all exercises as prescribed with focus on upper and lower spine stretching and strengtheing.  Improving core strength as evidenced by toleration to all exercises without needing rest periods.    Personal Factors and Comorbidities Profession;Comorbidity 3+;Comorbidity 1;Comorbidity 2    Stability/Clinical Decision Making Evolving/Moderate complexity    Clinical Decision Making Moderate    PT Frequency 2x / week    PT Duration 8 weeks    PT Treatment/Interventions ADLs/Self Care Home Management;Cryotherapy;Electrical Stimulation;Moist Heat;Ultrasound;DME Instruction;Gait training;Stair training;Functional mobility training;Therapeutic activities;Therapeutic exercise;Neuromuscular re-education;Patient/family education;Manual techniques;Passive range of motion;Dry needling;Taping;Joint Manipulations;Aquatic Therapy    PT Next Visit Plan activity progression, aquatics, flexiblity.  See what MD says about return to work. Aquatics: advance strengthening           Patient will benefit from skilled therapeutic intervention in order to improve the following deficits and impairments:  Abnormal gait, Decreased endurance, Decreased mobility, Increased muscle spasms, Decreased range of motion, Decreased activity tolerance, Pain, Increased fascial restricitons, Decreased strength, Difficulty walking, Impaired flexibility  Visit Diagnosis: Muscle weakness  (generalized)  Chronic bilateral low back pain, unspecified whether sciatica present  Cramp and spasm  Cervicalgia     Problem List Patient Active Problem List   Diagnosis Date Noted   Leg pain, bilateral 02/22/2021   Low back pain 01/12/2021   Osteoarthritis cervical spine 01/12/2021   Pain in left foot 12/15/2020   Unilateral primary osteoarthritis, left knee 01/10/2018   Chronic pain of left knee 01/10/2018   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   Angioedema 07/15/2014   Hypokalemia 07/15/2014   Diabetes mellitus, controlled (HCC) 07/03/2012   HTN (hypertension) 07/03/2012   Rheumatoid arthritis (HCC)  07/03/2012   Current chronic use of systemic steroids 07/03/2012   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 [place], [name], [address] Phone: [phone]   Fax:  [phone]  Name: ELVINA BOSCH MRN: [id number] Date of Birth: 1960-12-21

## 2021-04-22 ENCOUNTER — Other Ambulatory Visit: Payer: Self-pay | Admitting: Pharmacist

## 2021-04-22 DIAGNOSIS — M0579 Rheumatoid arthritis with rheumatoid factor of multiple sites without organ or systems involvement: Secondary | ICD-10-CM

## 2021-04-22 DIAGNOSIS — Z79899 Other long term (current) drug therapy: Secondary | ICD-10-CM

## 2021-04-22 NOTE — Progress Notes (Signed)
Next infusion scheduled for Remicade on 05/10/21 and due for updated orders. Diagnosis: RA  Dose: 5 mg/kg every 5 weeks (400mg  based on last recorded weight of 76.3 kg)  Last Clinic Visit: 02/07/21 Next Clinic Visit: 07/11/21  Last infusion: 04/04/21  Labs: CBC and CMP on 04/04/21 - elevated glucose, CBC wnl TB Gold: negative on 01/24/21   Orders placed for Remicade x 2 doses along with premedication of acetaminophen and diphenhydramine to be administered 30 minutes before medication infusion.  Standing CBC with diff/platelet and CMP with GFR orders placed to be drawn every 2 months.  Next TB gold due 01/21/22  01/23/22, PharmD, MPH, BCPS Clinical Pharmacist (Rheumatology and Pulmonology)

## 2021-05-04 ENCOUNTER — Other Ambulatory Visit: Payer: Self-pay

## 2021-05-04 ENCOUNTER — Ambulatory Visit (HOSPITAL_BASED_OUTPATIENT_CLINIC_OR_DEPARTMENT_OTHER): Payer: Medicare Other | Attending: Rheumatology | Admitting: Physical Therapy

## 2021-05-04 ENCOUNTER — Encounter (HOSPITAL_BASED_OUTPATIENT_CLINIC_OR_DEPARTMENT_OTHER): Payer: Self-pay | Admitting: Physical Therapy

## 2021-05-04 DIAGNOSIS — R2689 Other abnormalities of gait and mobility: Secondary | ICD-10-CM | POA: Diagnosis present

## 2021-05-04 DIAGNOSIS — M6281 Muscle weakness (generalized): Secondary | ICD-10-CM | POA: Diagnosis not present

## 2021-05-04 DIAGNOSIS — R252 Cramp and spasm: Secondary | ICD-10-CM | POA: Insufficient documentation

## 2021-05-04 DIAGNOSIS — M542 Cervicalgia: Secondary | ICD-10-CM | POA: Insufficient documentation

## 2021-05-04 DIAGNOSIS — G8929 Other chronic pain: Secondary | ICD-10-CM | POA: Insufficient documentation

## 2021-05-04 DIAGNOSIS — M545 Low back pain, unspecified: Secondary | ICD-10-CM | POA: Insufficient documentation

## 2021-05-04 NOTE — Therapy (Signed)
[facility] [address] [place], [name], [address] Phone: [phone]   Fax:  [phone]  Physical Therapy Treatment  Patient Details  Name: Wendy Woods MRN: [id number] Date of Birth: [birth year] Referring Provider (Wendy Woods): Norlene Campbell, MD   Encounter Date: 05/04/2021   Wendy Woods End of Session - 05/04/21 0810     Visit Number 24    Date for Wendy Woods Re-Evaluation 04/28/21    Authorization Type UHC medicare 10 visit progress note    Progress Note Due on Visit 30    Wendy Woods Start Time 0815    Wendy Woods Stop Time 0900    Wendy Woods Time Calculation (min) 45 min    Activity Tolerance Patient tolerated treatment well    Behavior During Therapy [facility] for tasks assessed/performed             Past Medical History:  Diagnosis Date   DDD (degenerative disc disease), cervical 03/22/2016   DDD (degenerative disc disease), lumbar 03/22/2016   GERD (gastroesophageal reflux disease)    Hyperlipidemia    Hypertension    Rheumatoid arthritis (HCC)    Rheumatoid arthritis(714.0)    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   Subjective Assessment - 05/04/21 0842     Subjective "Think I aggrevated my LB doing my hair and putting decorations away"    Currently in Pain? Yes    Pain Score 8     Pain Location Back    Pain Orientation Right;Left    Pain Descriptors / Indicators Sore    Pain Type Chronic pain    Pain Onset More than a month ago    Pain Frequency Intermittent    Pain Score 5    Pain Orientation Right;Left    Pain Descriptors / Indicators Aching    Pain Onset More than a month ago    Pain Frequency Intermittent    Pain Score 1                                           Wendy Woods Short Term Goals - 02/09/21 1123       Wendy Woods SHORT TERM GOAL #2   Baseline continues to be daily.  Wendy Woods reports they have improved but only about 10%. 02/09/21               Wendy Woods Long Term Goals - 04/11/21 0946       Wendy Woods LONG TERM GOAL #1   Title Patient will be indepdnent with water program and land based HEP to promote strengthening without a signifcant increase in pain    Status On-going      Wendy Woods LONG TERM GOAL #2   Title improve FOTO to > or = to 53    Baseline 53    Status Achieved      Wendy Woods LONG TERM GOAL #3   Title report > or = to 80% reduction in LBP and neck pain with home and work tasks    Baseline 50-60%    Status On-going  Wendy Woods LONG TERM GOAL #4   Title verbalize and demonstrate body mechanics and postural modificaitons for neck and lumbar protection with home and work tasks    Status Achieved      Wendy Woods LONG TERM GOAL #5   Title demonstrate bil neck rotation to > or = to 65 degrees to improve safety with driving    Baseline 55 degrees      Wendy Woods LONG TERM GOAL #6   Title Wendy Woods will increase LE strength to overall to > or = to 4+/5 when assessed in sitting to improve functional mobility    Status On-going      Wendy Woods LONG TERM GOAL #7   Title report > or = to 70% reduction in the frequency and intensity of headaches    Baseline 60% better    Status On-going            Wendy Woods seen for aquatic therapy today.  Treatment took place in water 3.25-4.8 ft in depth at the [facility] pool. Temp of water was 91.  Wendy Woods entered/exited the pool via stairs step to pattern independently with bilat rail.   Warm up: water walking 8 widths forward, back and sideways. Using submerged hand buoys for core engagement sidestepping x 4 widths Exercises: Hip hinges 2x10  Core rotation using kick board 2x10 STS 2x10 from 3rd water step with ues for positioning , tighten core and immediate standong  balance. Long leg proximal strengthening without UE support in 3.5 ft: hip flex;extension;add/abd 2 x10 Running x 4 widths between each long leg exercises for aerobic capacity training.   Wendy Woods requires buoyancy for support and to offload joints with strengthening exercises. Viscosity of the water is needed for resistance of strengthening; water current perturbations provides challenge to standing balance unsupported, requiring increased core activation.           Plan - 05/04/21 0829     Clinical Impression Statement Wendy Woods reporting continued decrease in overall pain.  Headaches continue although not daily and have decreased in there severity.  She has not yet been given the OK to return to work. 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 Advanced strengthening today and added aerobic capacity component.  Worked on hip hinging and STS improving execution. She does report some LB discomfort at end of treatment.  Got into jacuzzi (unbilled) upon completion ofr heat.    Stability/Clinical Decision Making Evolving/Moderate complexity    Clinical Decision Making Moderate    Rehab Potential Good    Wendy Woods Frequency 2x / week    Wendy Woods Duration 8 weeks    Wendy Woods Treatment/Interventions ADLs/Self Care Home Management;Cryotherapy;Electrical Stimulation;Moist Heat;Ultrasound;DME Instruction;Gait training;Stair training;Functional mobility training;Therapeutic activities;Therapeutic exercise;Neuromuscular re-education;Patient/family education;Manual techniques;Passive range of motion;Dry needling;Taping;Joint Manipulations;Aquatic Therapy    Wendy Woods Next Visit Plan activity progression, aquatics, flexiblity.  See what MD says about return to work. Aquatics: advance strengthening    Wendy Woods Home Exercise Plan Access Code: [email or web address]             Patient will benefit from skilled therapeutic intervention in order to improve the following  deficits and impairments:  Abnormal gait, Decreased endurance, Decreased mobility, Increased muscle spasms, Decreased range of motion, Decreased activity tolerance, Pain, Increased fascial restricitons, Decreased strength, Difficulty walking, Impaired flexibility  Visit Diagnosis: Muscle weakness (generalized)  Chronic bilateral low back pain, unspecified whether sciatica present  Cramp and spasm  Cervicalgia  Problem List Patient Active Problem List   Diagnosis Date Noted   Leg pain, bilateral 02/22/2021   Low back pain 01/12/2021   Osteoarthritis cervical spine 01/12/2021   Pain in left foot 12/15/2020   Unilateral primary osteoarthritis, left knee 01/10/2018   Chronic pain of left knee 01/10/2018   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   Angioedema 07/15/2014   Hypokalemia 07/15/2014   Diabetes mellitus, controlled (HCC) 07/03/2012   HTN (hypertension) 07/03/2012   Rheumatoid arthritis (HCC) 07/03/2012   Current chronic use of systemic steroids 07/03/2012   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 [place], [name], [address] Phone: [phone]   Fax:  [phone]  Name: Wendy Woods MRN: [id number] Date of Birth: Mar 04, 1961

## 2021-05-09 ENCOUNTER — Ambulatory Visit: Payer: Medicare Other | Attending: Orthopaedic Surgery

## 2021-05-09 ENCOUNTER — Other Ambulatory Visit: Payer: Self-pay

## 2021-05-09 DIAGNOSIS — M545 Low back pain, unspecified: Secondary | ICD-10-CM | POA: Diagnosis present

## 2021-05-09 DIAGNOSIS — M6281 Muscle weakness (generalized): Secondary | ICD-10-CM | POA: Insufficient documentation

## 2021-05-09 DIAGNOSIS — G8929 Other chronic pain: Secondary | ICD-10-CM | POA: Diagnosis present

## 2021-05-09 DIAGNOSIS — M542 Cervicalgia: Secondary | ICD-10-CM | POA: Diagnosis present
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
## 2021-05-10 ENCOUNTER — Ambulatory Visit (HOSPITAL_COMMUNITY)
Admission: RE | Admit: 2021-05-10 | Discharge: 2021-05-10 | Disposition: A | Discharge status: Home / Self Care | Payer: Medicare Other | Source: Ambulatory Visit | Attending: Rheumatology | Admitting: Rheumatology

## 2021-05-10 DIAGNOSIS — M0579 Rheumatoid arthritis with rheumatoid factor of multiple sites without organ or systems involvement: Secondary | ICD-10-CM | POA: Insufficient documentation

## 2021-05-10 MED ORDER — SODIUM CHLORIDE 0.9 % IV SOLN
5.0000 mg/kg | INTRAVENOUS | Status: DC
  Administered 2021-05-10: 400 mg via INTRAVENOUS
  Filled 2021-05-10: qty 40

## 2021-05-10 MED ORDER — ACETAMINOPHEN 325 MG PO TABS: 650.0000 mg | ORAL_TABLET | ORAL | Status: DC

## 2021-05-10 MED ORDER — DIPHENHYDRAMINE HCL 25 MG PO CAPS: 25.0000 mg | ORAL_CAPSULE | ORAL | Status: DC

## 2021-05-11 ENCOUNTER — Encounter (HOSPITAL_BASED_OUTPATIENT_CLINIC_OR_DEPARTMENT_OTHER): Payer: Self-pay | Admitting: Physical Therapy

## 2021-05-11 ENCOUNTER — Other Ambulatory Visit: Payer: Self-pay

## 2021-05-11 ENCOUNTER — Ambulatory Visit (HOSPITAL_BASED_OUTPATIENT_CLINIC_OR_DEPARTMENT_OTHER): Payer: Medicare Other | Admitting: Physical Therapy

## 2021-05-11 DIAGNOSIS — M6281 Muscle weakness (generalized): Secondary | ICD-10-CM | POA: Diagnosis not present

## 2021-05-11 DIAGNOSIS — M545 Low back pain, unspecified: Secondary | ICD-10-CM

## 2021-05-11 DIAGNOSIS — M542 Cervicalgia: Secondary | ICD-10-CM

## 2021-05-11 DIAGNOSIS — R252 Cramp and spasm: Secondary | ICD-10-CM

## 2021-05-11 NOTE — Therapy (Signed)
[facility] [address] [place], [name], [address] Phone: [phone]   Fax:  [phone]  Physical Therapy Treatment  Patient Details  Name: Wendy Woods MRN: [id number] Date of Birth: [birth year] Referring Provider (PT): Norlene Campbell, MD   Encounter Date: 05/11/2021   PT End of Session - 05/11/21 0816     Visit Number 26    Date for PT Re-Evaluation 06/20/21    Authorization Type UHC medicare 10 visit progress note    Progress Note Due on Visit 30    PT Start Time 0815    PT Stop Time 0858    PT Time Calculation (min) 43 min    Activity Tolerance Patient tolerated treatment well    Behavior During Therapy [facility] for tasks assessed/performed             Past Medical History:  Diagnosis Date   DDD (degenerative disc disease), cervical 03/22/2016   DDD (degenerative disc disease), lumbar 03/22/2016   GERD (gastroesophageal reflux disease)    Hyperlipidemia    Hypertension    Rheumatoid arthritis (HCC)    Rheumatoid arthritis(714.0)    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   Subjective Assessment - 05/11/21 0826     Subjective Little soar after last aquatic session but it overall made me feel better for the week".                                          PT Short Term Goals - 02/09/21 1123       PT SHORT TERM GOAL #2   Baseline continues to be daily.  Pt reports they have improved but only about 10%. 02/09/21               PT Long Term Goals - 05/09/21 0853       PT LONG TERM GOAL #1   Title Patient will be indepdnent with water program and land  based HEP to promote strengthening without a signifcant increase in pain    Baseline further advancement is needed    Time 6    Period Weeks    Status On-going    Target Date 06/20/21      PT LONG TERM GOAL #2   Title improve FOTO to > or = to 53    Baseline 53    Status Achieved      PT LONG TERM GOAL #3   Title report > or = to 90% reduction in LBP and neck pain with home and work tasks    Baseline 70%    Time 8    Period Weeks    Status Revised      PT LONG TERM GOAL #4   Title verbalize and demonstrate body mechanics and postural modificaitons for neck and lumbar protection with home and work tasks    Status Achieved      PT LONG TERM GOAL #5   Title demonstrate bil neck rotation to > or = to 65 degrees to improve safety with driving  Baseline 58, 60    Status On-going    Target Date 06/20/21      PT LONG TERM GOAL #6   Title Pt will increase LE strength to overall to > or = to 4+/5 when assessed in sitting to improve functional mobility    Time 6    Period Weeks    Status On-going    Target Date 06/20/21      PT LONG TERM GOAL #7   Title report > or = to 85% reduction in the frequency and intensity of headaches    Baseline 70%    Time 8    Period Weeks    Status Revised            Pt seen for aquatic therapy today.  Treatment took place in water 3.25-4.8 ft in depth at the [facility] pool. Temp of water was 91.  Pt entered/exited the pool via stairs step to pattern independently with bilat rail.   Warm up: water walking 8 widths forward, back and sideways. Added submerged hand buoys for core engagement and added resistance x 4 widths ea forward, back and side stepping with cues for increased speed Exercises: Hip hinges and core rotation x10 ea. Resisted lateral rotations using kick board R/L x10 with VC and demonstration for proper execution using kick board  -STS x10  Long leg proximal strengthening without UE support in 3.5 ft: hip  flex;extension;add/abd 2 x10 Running x 4 widths ea fwd and bwd for aerobic benefit. Cues for tightened core throughout.   Pt requires buoyancy for support and to offload joints with strengthening exercises. Viscosity of the water is needed for resistance of strengthening; water current perturbations provides challenge to standing balance unsupported, requiring increased core activation.        Plan - 05/11/21 0827     Clinical Impression Statement Progressed core and proximal strengthening.  Encouraged higher level of aerobic capacity challenge. Pt tolerates without increased LB sx.  Cues needed for proper execution.  Continued education on on importance of core strnegth.    Stability/Clinical Decision Making Evolving/Moderate complexity    Clinical Decision Making Moderate    Rehab Potential Good    PT Frequency 2x / week    PT Duration 8 weeks    PT Treatment/Interventions ADLs/Self Care Home Management;Cryotherapy;Electrical Stimulation;Moist Heat;Ultrasound;DME Instruction;Gait training;Stair training;Functional mobility training;Therapeutic activities;Therapeutic exercise;Neuromuscular re-education;Patient/family education;Manual techniques;Passive range of motion;Dry needling;Taping;Joint Manipulations;Aquatic Therapy             Patient will benefit from skilled therapeutic intervention in order to improve the following deficits and impairments:  Abnormal gait, Decreased endurance, Decreased mobility, Increased muscle spasms, Decreased range of motion, Decreased activity tolerance, Pain, Increased fascial restricitons, Decreased strength, Difficulty walking, Impaired flexibility  Visit Diagnosis: Muscle weakness (generalized)  Chronic bilateral low back pain, unspecified whether sciatica present  Cervicalgia  Cramp and spasm     Problem List Patient Active Problem List   Diagnosis Date Noted   Leg pain, bilateral 02/22/2021   Low back pain 01/12/2021    Osteoarthritis cervical spine 01/12/2021   Pain in left foot 12/15/2020   Unilateral primary osteoarthritis, left knee 01/10/2018   Chronic pain of left knee 01/10/2018   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   Angioedema 07/15/2014   Hypokalemia 07/15/2014   Diabetes mellitus, controlled (HCC) 07/03/2012   HTN (hypertension) 07/03/2012   Rheumatoid arthritis (HCC) 07/03/2012   Current chronic use of systemic steroids 07/03/2012   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 [place], [name], [address] Phone: [phone]   Fax:  [phone]  Name: Wendy Woods MRN: [id number] Date of Birth: Jun 03, 1960

## 2021-05-18 ENCOUNTER — Other Ambulatory Visit: Payer: Self-pay

## 2021-05-18 ENCOUNTER — Encounter: Payer: Self-pay | Admitting: Orthopaedic Surgery

## 2021-05-18 ENCOUNTER — Encounter (HOSPITAL_BASED_OUTPATIENT_CLINIC_OR_DEPARTMENT_OTHER): Payer: Self-pay | Admitting: Physical Therapy

## 2021-05-18 ENCOUNTER — Ambulatory Visit (HOSPITAL_BASED_OUTPATIENT_CLINIC_OR_DEPARTMENT_OTHER): Payer: Medicare Other | Admitting: Physical Therapy

## 2021-05-18 ENCOUNTER — Ambulatory Visit (INDEPENDENT_AMBULATORY_CARE_PROVIDER_SITE_OTHER): Payer: Medicare Other | Admitting: Orthopaedic Surgery

## 2021-05-18 DIAGNOSIS — M6281 Muscle weakness (generalized): Secondary | ICD-10-CM | POA: Diagnosis not present

## 2021-05-18 DIAGNOSIS — R2689 Other abnormalities of gait and mobility: Secondary | ICD-10-CM

## 2021-05-18 DIAGNOSIS — R252 Cramp and spasm: Secondary | ICD-10-CM

## 2021-05-18 DIAGNOSIS — M545 Low back pain, unspecified: Secondary | ICD-10-CM

## 2021-05-18 DIAGNOSIS — M5442 Lumbago with sciatica, left side: Secondary | ICD-10-CM

## 2021-05-18 DIAGNOSIS — M542 Cervicalgia: Secondary | ICD-10-CM

## 2021-05-18 NOTE — Therapy (Signed)
[facility] [address] [place], [place], [address] Phone: [phone]   Fax:  [phone]  Physical Therapy Treatment  Patient Details  Name: Wendy Woods MRN: [id number] Date of Birth: 1960-05-10 Referring Provider (PT): Joni Fears, MD   Encounter Date: 05/18/2021   PT End of Session - 05/18/21 0827     Visit Number 27    Date for PT Re-Evaluation 06/20/21    Authorization Type UHC medicare 10 visit progress note    Progress Note Due on Visit 17    PT Start Time 0821    PT Stop Time 0900    PT Time Calculation (min) 39 min    Equipment Utilized During Treatment Other (comment)    Activity Tolerance Patient tolerated treatment well    Behavior During Therapy [facility] for tasks assessed/performed             Past Medical History:  Diagnosis Date   DDD (degenerative disc disease), cervical 03/22/2016   DDD (degenerative disc disease), lumbar 03/22/2016   GERD (gastroesophageal reflux disease)    Hyperlipidemia    Hypertension    Rheumatoid arthritis ()    Rheumatoid arthritis(714.0)    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   Type II diabetes mellitus ([place])     Past Surgical History:  Procedure Laterality Date   APPENDECTOMY  1968   CATARACT EXTRACTION W/ INTRAOCULAR LENS  IMPLANT, BILATERAL Bilateral 11/2013-12/2013   KNEE ARTHROPLASTY     KNEE ARTHROSCOPY Left 1980's-1990's X 3   MYOMECTOMY  1990's    There were no vitals filed for this visit.   Subjective Assessment - 05/18/21 0826     Subjective "Walked in parade 2 miles on monday about 1 hour and I am a little tender about my right hip"  "I feel stronger"    Currently in Pain? Yes    Pain Score 5     Pain Location Back    Pain Descriptors / Indicators Sore    Pain Type Chronic pain    Pain Onset More than a month ago    Pain Score 4    Pain Location Neck    Pain Orientation Right;Left    Pain Descriptors / Indicators Aching    Pain Type Chronic pain     Pain Onset More than a month ago                                          PT Short Term Goals - 02/09/21 1123       PT SHORT TERM GOAL #2   Baseline continues to be daily.  Pt reports they have improved but only about 10%. 02/09/21               PT Long Term Goals - 05/09/21 0853       PT LONG TERM GOAL #1   Title Patient will be indepdnent with water program and land based HEP to promote strengthening without a signifcant increase in pain    Baseline further advancement is needed    Time 6    Period Weeks    Status On-going    Target Date 06/20/21      PT LONG TERM GOAL #2   Title improve FOTO to > or = to 53    Baseline 53    Status Achieved      PT LONG TERM GOAL #3  Title report > or = to 90% reduction in LBP and neck pain with home and work tasks    Baseline 70%    Time 8    Period Weeks    Status Revised      PT LONG TERM GOAL #4   Title verbalize and demonstrate body mechanics and postural modificaitons for neck and lumbar protection with home and work tasks    Status Achieved      PT LONG TERM GOAL #5   Title demonstrate bil neck rotation to > or = to 65 degrees to improve safety with driving    Baseline 58, 60    Status On-going    Target Date 06/20/21      PT LONG TERM GOAL #6   Title Pt will increase LE strength to overall to > or = to 4+/5 when assessed in sitting to improve functional mobility    Time 6    Period Weeks    Status On-going    Target Date 06/20/21      PT LONG TERM GOAL #7   Title report > or = to 85% reduction in the frequency and intensity of headaches    Baseline 70%    Time 8    Period Weeks    Status Revised            Pt seen for aquatic therapy today.  Treatment took place in water 3.25-4.8 ft in depth at the [employer] [facility] pool. Temp of water was 91.  Pt entered/exited the pool via stairs step to pattern independently with bilat rail.   Warm up: water walking 8 widths  forward, back and sideways. Added submerged hand buoys for core engagement and added resistance x 4 widths ea forward, back and side stepping with cues for increased speed Exercises: Hip hinges and core rotation alternated with lb rotation 2 x10 ea. Hip circles 2x10 R/L Resisted lateral rotations using kick board R/L x10 with VC and demonstration for proper execution using kick board  -STS x10  Long leg proximal strengthening without UE support in 3.5 ft: hip flex;extension;add/abd 2 x10 Running x 4 widths ea fwd and bwd for aerobic benefit. Cues for tightened core throughout.   Pt requires buoyancy for support and to offload joints with strengthening exercises. Viscosity of the water is needed for resistance of strengthening; water current perturbations provides challenge to standing balance unsupported, requiring increased core activation.        Plan - 05/18/21 0829     Clinical Impression Statement Pt reporting headaches are better. She is returning to work at the [employer] tonight since sometime in middle to early dec. She alsoreports feeling stronger as she was able to tolerate walking 2 miles earlier this week with complaints mostly of fatigue. Pt completes all exercises wthout increase in sx.  No fatigue.  Progressing well.    Stability/Clinical Decision Making Evolving/Moderate complexity    Clinical Decision Making Moderate    Rehab Potential Good    PT Frequency 2x / week    PT Duration 8 weeks    PT Treatment/Interventions ADLs/Self Care Home Management;Cryotherapy;Electrical Stimulation;Moist Heat;Ultrasound;DME Instruction;Gait training;Stair training;Functional mobility training;Therapeutic activities;Therapeutic exercise;Neuromuscular re-education;Patient/family education;Manual techniques;Passive range of motion;Dry needling;Taping;Joint Manipulations;Aquatic Therapy    PT Next Visit Plan activity progression, aquatics, flexiblity.   Aquatics: advance strengthening    PT  Home Exercise Plan Access Code: [email or web address]             Patient will benefit from skilled therapeutic intervention  in order to improve the following deficits and impairments:  Abnormal gait, Decreased endurance, Decreased mobility, Increased muscle spasms, Decreased range of motion, Decreased activity tolerance, Pain, Increased fascial restricitons, Decreased strength, Difficulty walking, Impaired flexibility  Visit Diagnosis: Muscle weakness (generalized)  Chronic bilateral low back pain, unspecified whether sciatica present  Cervicalgia  Cramp and spasm  Other abnormalities of gait and mobility     Problem List Patient Active Problem List   Diagnosis Date Noted   Leg pain, bilateral 02/22/2021   Low back pain 01/12/2021   Osteoarthritis cervical spine 01/12/2021   Pain in left foot 12/15/2020   Unilateral primary osteoarthritis, left knee 01/10/2018   Chronic pain of left knee 01/10/2018   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   Angioedema 07/15/2014   Hypokalemia 07/15/2014   Diabetes mellitus, controlled ([place]) 07/03/2012   HTN (hypertension) 07/03/2012   Rheumatoid arthritis ([place]) 07/03/2012   Current chronic use of systemic steroids 07/03/2012   

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
## 2021-05-23 ENCOUNTER — Telehealth: Payer: Self-pay

## 2021-05-23 NOTE — Telephone Encounter (Signed)
PT called and left message for pt due to No-show today

## 2021-05-25 ENCOUNTER — Ambulatory Visit (HOSPITAL_BASED_OUTPATIENT_CLINIC_OR_DEPARTMENT_OTHER): Payer: Medicare Other | Admitting: Physical Therapy

## 2021-06-01 ENCOUNTER — Ambulatory Visit (HOSPITAL_BASED_OUTPATIENT_CLINIC_OR_DEPARTMENT_OTHER): Payer: Commercial Managed Care - HMO | Admitting: Physical Therapy

## 2021-06-02 ENCOUNTER — Other Ambulatory Visit: Payer: Self-pay

## 2021-06-02 ENCOUNTER — Ambulatory Visit: Payer: Medicare Other | Attending: Orthopaedic Surgery

## 2021-06-02 DIAGNOSIS — M6281 Muscle weakness (generalized): Secondary | ICD-10-CM | POA: Diagnosis not present

## 2021-06-02 DIAGNOSIS — M542 Cervicalgia: Secondary | ICD-10-CM | POA: Insufficient documentation

## 2021-06-02 DIAGNOSIS — G8929 Other chronic pain: Secondary | ICD-10-CM | POA: Diagnosis present

## 2021-06-02 DIAGNOSIS — M545 Low back pain, unspecified: Secondary | ICD-10-CM | POA: Insufficient documentation

## 2021-06-02 DIAGNOSIS — R252 Cramp and spasm: Secondary | ICD-10-CM | POA: Insufficient documentation

## 2021-06-02 NOTE — Therapy (Signed)
[facility] [address] [place], [name], [address] Phone: [phone]   Fax:  [phone]  Physical Therapy Treatment  Patient Details  Name: Wendy Woods MRN: [id number] Date of Birth: Sep 26, 1960 Referring Provider (PT): Norlene Campbell, MD   Encounter Date: 06/02/2021   PT End of Session - 06/02/21 0832     Visit Number 28    Date for PT Re-Evaluation 06/20/21    Authorization Type UHC medicare 10 visit progress note    Progress Note Due on Visit 30    PT Start Time 0802    PT Stop Time 0838    PT Time Calculation (min) 36 min    Activity Tolerance Patient tolerated treatment well    Behavior During Therapy [facility] for tasks assessed/performed             Past Medical History:  Diagnosis Date   DDD (degenerative disc disease), cervical 03/22/2016   DDD (degenerative disc disease), lumbar 03/22/2016   GERD (gastroesophageal reflux disease)    Hyperlipidemia    Hypertension    Rheumatoid arthritis (HCC)    Rheumatoid arthritis(714.0)    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   Subjective Assessment - 06/02/21 0807     Subjective My job let me go.  I can rehire in a different job role if I want.  I think I want to change to land based PT now.    Currently in Pain? Yes    Pain Score 6     Pain Location Back    Pain Orientation Left;Right    Pain Descriptors / Indicators Sore    Pain Type Chronic pain    Pain Onset More than a month ago    Pain Frequency Intermittent    Aggravating Factors  too much activity    Pain Relieving Factors heat, ice, pain medication                                [facility] Adult PT Treatment/Exercise - 06/02/21 0001       Neck Exercises: Machines for Strengthening   UBE (Upper Arm Bike) level 1.5 x 6 minutes (3/3)      Neck Exercises: Stretches   Upper Trapezius Stretch 3 reps;20 seconds      Lumbar Exercises: Aerobic   Nustep Level 2 x 8.5 min- PT present to discuss progress      Lumbar Exercises: Standing   Other Standing Lumbar Exercises walking in reverse: 10# x 10      Lumbar Exercises: Seated   Other Seated Lumbar Exercises sit to stand:  10# kettle bell x 20      Shoulder Exercises: Seated   Flexion Strengthening;Both;20 reps;Weights    Flexion Weight (lbs) 2      Shoulder Exercises: Standing   Other Standing Exercises Lat pull down: 25# 2x10                       PT Short Term Goals - 01/19/21 0937       PT SHORT  TERM GOAL #1   Title be independent in initial HEP    Status Achieved      PT SHORT TERM GOAL #2   Title report a 30% reduction in the frequency and intensity of headaches    Baseline daily headaches    Status On-going      PT SHORT TERM GOAL #3   Title report a 30% reduction in the frequency and intensity of neck and low back pain to improve function at home and at work    Status Achieved               PT Long Term Goals - 06/02/21 0816       PT LONG TERM GOAL #1   Title Patient will be indepdnent with water program and land based HEP to promote strengthening without a signifcant increase in pain    Status On-going      PT LONG TERM GOAL #3   Title report > or = to 90% reduction in LBP and neck pain with home and work tasks    Baseline 70%    Status On-going                   Plan - 06/02/21 1610     Clinical Impression Statement Pt with lapse in treatment x 2 weeks.  Pt reports 70% overall improvement since the start of care.  Pt tolerated all exercises well in the clinic today.  Pt has been walking and exercising at home for  endurance gains.  Pt will transition to all land based PT moving forward due to improved strength and endurance.  Pt will continue to benefit from skilled PT to address pain, endurance and flexibility.    PT Treatment/Interventions ADLs/Self Care Home Management;Cryotherapy;Electrical Stimulation;Moist Heat;Ultrasound;DME Instruction;Gait training;Stair training;Functional mobility training;Therapeutic activities;Therapeutic exercise;Neuromuscular re-education;Patient/family education;Manual techniques;Passive range of motion;Dry needling;Taping;Joint Manipulations;Aquatic Therapy    PT Next Visit Plan activity progression, aquatics, flexiblit, advance strengthening    PT Home Exercise Plan Access Code: [email or web address]    Consulted and Agree with Plan of Care Patient             Patient will benefit from skilled therapeutic intervention in order to improve the following deficits and impairments:  Abnormal gait, Decreased endurance, Decreased mobility, Increased muscle spasms, Decreased range of motion, Decreased activity tolerance, Pain, Increased fascial restricitons, Decreased strength, Difficulty walking, Impaired flexibility  Visit Diagnosis: Muscle weakness (generalized)  Chronic bilateral low back pain, unspecified whether sciatica present  Cervicalgia     Problem List Patient Active Problem List   Diagnosis Date Noted   Leg pain, bilateral 02/22/2021   Low back pain 01/12/2021   Osteoarthritis cervical spine 01/12/2021   Pain in left foot 12/15/2020   Unilateral primary osteoarthritis, left knee 01/10/2018   Chronic pain of left knee 01/10/2018   Scleritis 03/22/2016   Angioedema 07/15/2014   Hypokalemia 07/15/2014   Diabetes mellitus, controlled (HCC) 07/03/2012   HTN (hypertension) 07/03/2012   Rheumatoid arthritis (HCC) 07/03/2012   Current chronic use of systemic steroids 07/03/2012   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 [place], [name], [address] Phone: [phone]   Fax:  [phone]  Name: Wendy Woods MRN: [id number] Date of Birth: December 09, 1960

## 2021-06-06 ENCOUNTER — Ambulatory Visit
Admission: RE | Admit: 2021-06-06 | Discharge: 2021-06-06 | Disposition: A | Discharge status: Home / Self Care | Payer: Medicare Other | Source: Ambulatory Visit | Attending: Internal Medicine | Admitting: Internal Medicine

## 2021-06-06 DIAGNOSIS — E2839 Other primary ovarian failure: Secondary | ICD-10-CM

## 2021-06-08 ENCOUNTER — Ambulatory Visit (HOSPITAL_BASED_OUTPATIENT_CLINIC_OR_DEPARTMENT_OTHER): Payer: Commercial Managed Care - HMO | Admitting: Physical Therapy

## 2021-06-09 ENCOUNTER — Other Ambulatory Visit: Payer: Self-pay

## 2021-06-09 ENCOUNTER — Ambulatory Visit: Payer: Medicare Other

## 2021-06-09 DIAGNOSIS — M6281 Muscle weakness (generalized): Secondary | ICD-10-CM | POA: Diagnosis not present

## 2021-06-09 DIAGNOSIS — G8929 Other chronic pain: Secondary | ICD-10-CM

## 2021-06-09 DIAGNOSIS — M542 Cervicalgia: Secondary | ICD-10-CM

## 2021-06-09 NOTE — Therapy (Signed)
OUTPATIENT PHYSICAL THERAPY TREATMENT NOTE   Patient Name: Wendy PinkDeborah F Droke MRN: [id number] DOB:1960-08-25, 61 y.o., female Today's Date: 06/09/2021  PCP: Fleet ContrasAvbuere, Edwin, MD REFERRING PROVIDER: Valeria BatmanWhitfield, Peter W, MD   PT End of Session - 06/09/21 0840     Visit Number 29    Date for PT Re-Evaluation 06/20/21    Authorization Type UHC medicare 10 visit progress note    Progress Note Due on Visit 30    PT Start Time 0814   late   PT Stop Time 0844    PT Time Calculation (min) 30 min    Activity Tolerance Patient tolerated treatment well    Behavior [facility] for tasks assessed/performed             Past Medical History:  Diagnosis Date   DDD (degenerative disc disease), cervical 03/22/2016   DDD (degenerative disc disease), lumbar 03/22/2016   GERD (gastroesophageal reflux disease)    Hyperlipidemia    Hypertension    Rheumatoid arthritis (HCC)    Rheumatoid arthritis(714.0)    Scleritis 03/22/2016   Type II diabetes mellitus (HCC)    Past Surgical History:  Procedure Laterality Date   APPENDECTOMY  1968   CATARACT EXTRACTION W/ INTRAOCULAR LENS  IMPLANT, BILATERAL Bilateral 11/2013-12/2013   KNEE ARTHROPLASTY     KNEE ARTHROSCOPY Left 1980's-1990's X 3   MYOMECTOMY  1990's   Patient Active Problem List   Diagnosis Date Noted   Leg pain, bilateral 02/22/2021   Low back pain 01/12/2021   Osteoarthritis cervical spine 01/12/2021   Pain in left foot 12/15/2020   Unilateral primary osteoarthritis, left knee 01/10/2018   Chronic pain of left knee 01/10/2018   Scleritis 03/22/2016   Angioedema 07/15/2014   Hypokalemia 07/15/2014   Diabetes mellitus, controlled (HCC) 07/03/2012   HTN (hypertension) 07/03/2012   Rheumatoid arthritis (HCC) 07/03/2012   Current chronic use of systemic steroids 07/03/2012   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  I am interviewing for jobs that will allow me to sit while I work.   PAIN:  Are you having pain? Yes NPRS scale: 5/10 Pain location: low back Pain orientation: Bilateral and Lower  PAIN TYPE: aching Pain description: dull  Aggravating factors: activity, sometimes it just hurts  Relieving factors: rest, stretching    TODAY'S TREATMENT:  06/09/21 Exercise:  Strength: Arm bike: Level 1.5 x 6 min (3/3) NuStep: level 3x 10 min Sit to stand: pad in seat 10# kettle bell 2x10 Lat pull down: 25# 2x10 in standing  Flexibility:  UT stretch 3x20 seconds - tactile cues to reduce trunk substitution Ball roll outs: forward and lateral x 5 each    HOME EXERCISE PROGRAM: Access Code: [email or web address]   PT Short Term Goals - 01/19/21 [id number]       PT SHORT TERM GOAL #1   Title be independent in initial HEP    Status Achieved      PT SHORT TERM GOAL #2   Title report a 30% reduction in the frequency and intensity of headaches    Baseline daily headaches    Status On-going      PT SHORT TERM GOAL #3   Title  report a 30% reduction in the frequency and intensity of neck and low back pain to improve function at home and at work    Status Achieved              PT Long Term Goals - 06/02/21 0816       PT LONG TERM GOAL #1   Title Patient will be indepdnent with water program and land based HEP to promote strengthening without a signifcant increase in pain    Status On-going      PT LONG TERM GOAL #3   Title report > or = to 90% reduction in LBP and neck pain with home and work tasks    Baseline 70%    Status On-going              Plan - 06/09/21 0819     Clinical Impression Statement Pt reports 70% overall improvement since the start of care.  Pt missed her  appt yesterday due forgetting she had an appt.  Pt arrived with 5/10 LBP today and  Pt tolerated all exercises well in the clinic without limitation.  Pt has been walking and exercising at home for endurance gains.  Pt is doing land based therapy only. Pt was monitored closely for technique and fatigue during treatment.   Pt will continue to benefit from skilled PT to address pain, endurance and flexibility.    Rehab Potential Good    PT Frequency 2x / week    PT Duration 8 weeks    PT Treatment/Interventions ADLs/Self Care Home Management;Cryotherapy;Electrical Stimulation;Moist Heat;Ultrasound;DME Instruction;Gait training;Stair training;Functional mobility training;Therapeutic activities;Therapeutic exercise;Neuromuscular re-education;Patient/family education;Manual techniques;Passive range of motion;Dry needling;Taping;Joint Manipulations;Aquatic Therapy    PT Next Visit Plan activity progression, flexibility, advance strengthening    PT Home Exercise Plan Access Code: [email or web address]    Consulted and Agree with Plan of Care Patient              Lorrene Reid, PT 06/09/21 8:42 AM

## 2021-06-10 ENCOUNTER — Telehealth: Payer: Self-pay

## 2021-06-10 NOTE — Telephone Encounter (Signed)
Patient called to find out if she can have her Remicaid infusion on the weekend.  Patient requested a return call.

## 2021-06-10 NOTE — Telephone Encounter (Signed)
Per [employer], [facility] only has weekend availability once every 4 weeks. Patient's infusions are every 5 weeks. This would not be feasible for her dosing schedule. ATC patient to review but unable to reach. Will f/u upcoming week  Chesley Mires, PharmD, MPH, BCPS Clinical Pharmacist (Rheumatology and Pulmonology)

## 2021-06-10 NOTE — Telephone Encounter (Signed)
Patient advised if [facility] does not offer an appointment on the weekend then she would be unable to have her infusion.

## 2021-06-13 ENCOUNTER — Other Ambulatory Visit: Payer: Self-pay

## 2021-06-13 ENCOUNTER — Ambulatory Visit: Payer: Medicare Other

## 2021-06-13 DIAGNOSIS — M6281 Muscle weakness (generalized): Secondary | ICD-10-CM

## 2021-06-13 DIAGNOSIS — M542 Cervicalgia: Secondary | ICD-10-CM

## 2021-06-13 DIAGNOSIS — G8929 Other chronic pain: Secondary | ICD-10-CM

## 2021-06-13 DIAGNOSIS — M545 Low back pain, unspecified: Secondary | ICD-10-CM

## 2021-06-13 NOTE — Therapy (Signed)
OUTPATIENT PHYSICAL THERAPY TREATMENT NOTE   Patient Name: Wendy Woods MRN: [id number] DOB:1961-03-06, 61 y.o., female Today's Date: 06/13/2021  PCP: Fleet ContrasAvbuere, Edwin, MD REFERRING PROVIDER: Valeria BatmanWhitfield, Peter W, MD  Progress Note Reporting Period 04/13/21 to 06/13/21  See note below for Objective Data and Assessment of Progress/Goals.    PT End of Session - 06/13/21 0838     Visit Number 30    Date for PT Re-Evaluation 06/20/21    Authorization Type [employer] medicare 10 visit progress note    Progress Note Due on Visit 40    PT Start Time 0800    PT Stop Time 0842    PT Time Calculation (min) 42 min    Activity Tolerance Patient tolerated treatment well    Behavior During Therapy [facility] for tasks assessed/performed                Past Medical History:  Diagnosis Date   DDD (degenerative disc disease), cervical 03/22/2016   DDD (degenerative disc disease), lumbar 03/22/2016   GERD (gastroesophageal reflux disease)    Hyperlipidemia    Hypertension    Rheumatoid arthritis (HCC)    Rheumatoid arthritis(714.0)    Scleritis 03/22/2016   Type II diabetes mellitus (HCC)    Past Surgical History:  Procedure Laterality Date   APPENDECTOMY  1968   CATARACT EXTRACTION W/ INTRAOCULAR LENS  IMPLANT, BILATERAL Bilateral 11/2013-12/2013   KNEE ARTHROPLASTY     KNEE ARTHROSCOPY Left 1980's-1990's X 3   MYOMECTOMY  1990's   Patient Active Problem List   Diagnosis Date Noted   Leg pain, bilateral 02/22/2021   Low back pain 01/12/2021   Osteoarthritis cervical spine 01/12/2021   Pain in left foot 12/15/2020   Unilateral primary osteoarthritis, left knee 01/10/2018   Chronic pain of left knee 01/10/2018   Scleritis 03/22/2016   Angioedema 07/15/2014   Hypokalemia 07/15/2014   Diabetes mellitus, controlled (HCC) 07/03/2012   HTN (hypertension) 07/03/2012   Rheumatoid arthritis (HCC) 07/03/2012   Current chronic use of systemic steroids 07/03/2012   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  My back is tender but not bad.   PAIN:  Are you having pain? Yes NPRS scale: 3/10 Pain location: low back Pain orientation: Bilateral and Lower  PAIN TYPE: aching Pain description: dull  Aggravating factors: activity, sometimes it just hurts  Relieving factors: rest, stretching  Objective: 06/13/21 Cervical A/ROM: flexion is full, Sidebending Rt 40, Lt 45.  Rotation: Rt 60, Lt 55  TODAY'S TREATMENT: 06/13/21   Exercise:  Strength: Arm bike: Level 1.5 x 6 min (3/3) NuStep: level 3x 10 min Sit to stand: pad in seat 10# kettle bell 2x10 Lat pull down: 25# 2x10 in standing  Rows: 10# 2x10 Walking in reverse with pulley weight: 10# x10 Flexibility:  UT stretch, cervical rotation. 3x20 seconds - tactile cues to reduce trunk substitution Ball roll outs: forward and lateral x 5 each  06/09/21 Exercise:  Strength: Arm bike: Level 1.5 x 6 min (3/3) NuStep: level 3x 10 min Sit to stand: pad in seat 10# kettle bell 2x10 Lat pull down: 25# 2x10 in standing   Flexibility:  UT stretch 3x20 seconds - tactile cues to reduce trunk substitution [facility]  roll outs: forward and lateral x 5 each    HOME EXERCISE PROGRAM: Access Code: [email or web address]   PT Short Term Goals - 01/19/21 0937       PT SHORT TERM GOAL #1   Title be independent in initial HEP    Status Achieved      PT SHORT TERM GOAL #2   Title report a 30% reduction in the frequency and intensity of headaches    Baseline daily headaches    Status On-going      PT SHORT TERM GOAL #3   Title report a 30% reduction in the frequency and intensity of neck and low back pain to improve function at home and at work     Status Achieved              PT Long Term Goals - 06/13/21 0812       PT LONG TERM GOAL #1   Title Patient will be indepdnent with water program and land based HEP to promote strengthening without a signifcant increase in pain    Status On-going      PT LONG TERM GOAL #2   Title improve FOTO to > or = to 53    Baseline 53    Status Achieved      PT LONG TERM GOAL #3   Title report > or = to 90% reduction in LBP and neck pain with home and work tasks    Baseline 70%    Status Achieved      PT LONG TERM GOAL #4   Title verbalize and demonstrate body mechanics and postural modificaitons for neck and lumbar protection with home and work tasks    Status Achieved      PT LONG TERM GOAL #5   Title demonstrate bil neck rotation to > or = to 65 degrees to improve safety with driving  Ongoing     PT LONG TERM GOAL #6   Title Pt will increase LE strength to overall to > or = to 4+/5 when assessed in sitting to improve functional mobility    Status On-going              Plan - 06/13/21 0809     Clinical Impression Statement Pt reports 70% overall improvement since the start of care.   Pt arrived with reduced pain overall. Pt tolerated all exercises well in the clinic without limitation.  Pt has been walking and exercising at home for endurance gains.  Cervical rotation and sidebending is limited with some pain at end range, Pt was monitored closely for technique and fatigue during treatment.   Pt will continue to benefit from skilled PT to address pain, endurance and flexibility.    Rehab Potential Good    PT Frequency 2x / week    PT Duration 8 weeks    PT Treatment/Interventions ADLs/Self Care Home Management;Cryotherapy;Electrical Stimulation;Moist Heat;Ultrasound;DME Instruction;Gait training;Stair training;Functional mobility training;Therapeutic activities;Therapeutic exercise;Neuromuscular re-education;Patient/family education;Manual techniques;Passive range of motion;Dry  needling;Taping;Joint Manipulations;Aquatic Therapy    PT Next Visit Plan activity progression, flexibility, advance strengthening.  ERO next session.  Either D/C or 2-3 more sessions.     PT Home Exercise Plan Access Code: [email or web address]    Consulted and Agree with Plan of Care Patient               Lorrene Reid, PT 06/13/21 8:39 AM

## 2021-06-14 ENCOUNTER — Ambulatory Visit (HOSPITAL_COMMUNITY): Payer: Commercial Managed Care - HMO

## 2021-06-14 NOTE — Telephone Encounter (Signed)
ATC patient to review best option for her infusions. Left VM requesting return call to review.  Chesley Mires, PharmD, MPH, BCPS Clinical Pharmacist (Rheumatology and Pulmonology)

## 2021-06-15 ENCOUNTER — Ambulatory Visit (HOSPITAL_BASED_OUTPATIENT_CLINIC_OR_DEPARTMENT_OTHER): Payer: Commercial Managed Care - HMO | Admitting: Physical Therapy

## 2021-06-20 ENCOUNTER — Ambulatory Visit: Payer: Medicare Other

## 2021-06-20 ENCOUNTER — Other Ambulatory Visit: Payer: Self-pay

## 2021-06-20 DIAGNOSIS — M6281 Muscle weakness (generalized): Secondary | ICD-10-CM

## 2021-06-20 DIAGNOSIS — G8929 Other chronic pain: Secondary | ICD-10-CM

## 2021-06-20 DIAGNOSIS — M542 Cervicalgia: Secondary | ICD-10-CM

## 2021-06-20 DIAGNOSIS — M545 Low back pain, unspecified: Secondary | ICD-10-CM

## 2021-06-20 DIAGNOSIS — R252 Cramp and spasm: Secondary | ICD-10-CM

## 2021-06-20 NOTE — Therapy (Signed)
OUTPATIENT PHYSICAL THERAPY TREATMENT NOTE   Patient Name: Wendy Woods MRN: [id number] DOB:1960/12/24, 61 y.o., female Today's Date: 06/20/2021  PCP: Nolene Ebbs, MD REFERRING PROVIDER: Joni Fears, MD  Onset date: 12/04/20   Onset date:   PT End of Session - 06/20/21 0833     Visit Number 63    PT Start Time 0800    PT Stop Time 0841    PT Time Calculation (min) 41 min    Activity Tolerance Patient tolerated treatment well    Behavior During Therapy [facility] for tasks assessed/performed                 Past Medical History:  Diagnosis Date   DDD (degenerative disc disease), cervical 03/22/2016   DDD (degenerative disc disease), lumbar 03/22/2016   GERD (gastroesophageal reflux disease)    Hyperlipidemia    Hypertension    Rheumatoid arthritis ([place])    Rheumatoid arthritis(714.0)    Scleritis 03/22/2016   Type II diabetes mellitus ([place])    Past Surgical History:  Procedure Laterality Date   APPENDECTOMY  1968   CATARACT EXTRACTION W/ INTRAOCULAR LENS  IMPLANT, BILATERAL Bilateral 11/2013-12/2013   KNEE ARTHROPLASTY     KNEE ARTHROSCOPY Left 1980's-1990's X 3   MYOMECTOMY  1990's   Patient Active Problem List   Diagnosis Date Noted   Leg pain, bilateral 02/22/2021   Low back pain 01/12/2021   Osteoarthritis cervical spine 01/12/2021   Pain in left foot 12/15/2020   Unilateral primary osteoarthritis, left knee 01/10/2018   Chronic pain of left knee 01/10/2018   Scleritis 03/22/2016   Angioedema 07/15/2014   Hypokalemia 07/15/2014   Diabetes mellitus, controlled ([place]) 07/03/2012   HTN (hypertension) 07/03/2012   Rheumatoid arthritis ([place]) 07/03/2012   Current chronic use of systemic steroids 07/03/2012   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  I am 90% better and I will continue to work on strength and flexibility at home.  I see the neurologist next week.    PAIN:  Are you having pain? Yes NPRS scale: 3/10 Pain location: low back Pain orientation: Bilateral and Lower  PAIN TYPE: aching Pain description: dull  Aggravating factors: activity, sometimes it just hurts  Relieving factors: rest, stretching  Objective:  06/20/21:  MMT: knees 5/5, hip flexion 4+/5  06/13/21 Cervical A/ROM: flexion is full, Sidebending Rt 40, Lt 45.  Rotation: Rt 60, Lt 55 FOTO 53- goal met   TODAY'S TREATMENT:  06/20/21   Exercise:  Strength: Arm bike: Level 1.5 x 6 min (3/3) NuStep: level 3x 10 min Sit to stand: pad in seat 10# kettle bell 2x10 Standing hip abduction and extension 2x10 bil each-core engagement Rows: red band-2x10 Walking in reverse with pulley weight: 10# x15 Flexibility:  UT stretch, cervical rotation. 3x20 seconds - tactile cues to reduce trunk substitution   06/13/21   Exercise:  Strength: Arm bike: Level 1.5 x 6 min (3/3) NuStep: level 3x 10 min Sit to stand: pad in seat 10# kettle bell 2x10 Lat pull down: 25# 2x10 in standing  Rows: 10# 2x10 Walking in reverse with pulley weight: 10# x10 Flexibility:  UT stretch, cervical rotation. 3x20 seconds - tactile cues  to reduce trunk substitution Ball roll outs: forward and lateral x 5 each  06/09/21 Exercise:  Strength: Arm bike: Level 1.5 x 6 min (3/3) NuStep: level 3x 10 min Sit to stand: pad in seat 10# kettle bell 2x10 Lat pull down: 25# 2x10 in standing   Flexibility:  UT stretch 3x20 seconds - tactile cues to reduce trunk substitution Ball roll outs: forward and lateral x 5 each    HOME EXERCISE PROGRAM: PATIENT EDUCATION: Education details: Access Code: [email or web address]-verbal review of all HEP issued up to this point and HEP  advanced for D/C Person educated: Patient Education method: Explanation, Demonstration, and Handouts Education comprehension: verbalized understanding   HOME EXERCISE PROGRAM:  Access Code: [email or web address] URL: [email or web address] Date: 06/20/2021 Prepared by: Claiborne Billings  Exercises Supine Lower Trunk Rotation - 3 x daily - 7 x weekly - 1 sets - 3 reps - 20-30 hold Hooklying Single Knee to Chest Stretch - 3 x daily - 7 x weekly - 1 sets - 3 reps - 20-30 hold Seated Hamstring Stretch - 3 x daily - 7 x weekly - 1 sets - 3 reps - 20-30 hold Seated Cervical Flexion AROM - 3 x daily - 7 x weekly - 1 sets - 3 reps - 20 hold Seated Cervical Sidebending AROM - 3 x daily - 7 x weekly - 1 sets - 3 reps - 20 hold Seated Cervical Rotation AROM - 3 x daily - 7 x weekly - 1 sets - 3 reps - 20 hold Seated Correct Posture - 1 x daily - 7 x weekly - 3 sets - 10 reps Added to HEP: 06/20/21 Sit to Stand Without Arm Support - 2 x daily - 7 x weekly - 2 sets - 10 reps Standing Row with Resistance - 2 x daily - 7 x weekly - 2 sets - 10 reps Standing Hip Abduction with Counter Support - 1 x daily - 7 x weekly - 2 sets - 10 reps Standing Hip Extension with Counter Support - 1 x daily - 7 x weekly - 2 sets - 10 reps    PT Short Term Goals - 01/19/21 7510       PT SHORT TERM GOAL #1   Title be independent in initial HEP    Status Achieved      PT SHORT TERM GOAL #2   Title report a 30% reduction in the frequency and intensity of headaches    Baseline daily headaches    Status On-going      PT SHORT TERM GOAL #3   Title report a 30% reduction in the frequency and intensity of neck and low back pain to improve function at home and at work    Status Achieved              PT Long Term Goals - 06/13/21 2585       PT LONG TERM GOAL #1   Title Patient will be indepdnent with water program and land based HEP to promote strengthening without a signifcant increase in pain    Status  MET     PT  LONG TERM GOAL #2   Title improve FOTO to > or = to 53    Baseline 53    Status Achieved      PT LONG TERM GOAL #3   Title report > or = to 90% reduction in LBP and neck pain with home and work tasks    Baseline 90% (06/20/21)   Status Achieved  PT LONG TERM GOAL #4   Title verbalize and demonstrate body mechanics and postural modificaitons for neck and lumbar protection with home and work tasks    Status Achieved      PT LONG TERM GOAL #5   Title demonstrate bil neck rotation to > or = to 65 degrees to improve safety with driving  Partially met Rotation: Rt 60, Lt 55     PT LONG TERM GOAL #6   Title Pt will increase LE strength to overall to > or = to 4+/5 when assessed in sitting to improve functional mobility    Status MET             Plan - 06/13/21 0809     Clinical Impression Statement Pt reports 90% overall improvement since the start of care and is ready to D/C to HEP today.  Pt has met most of her goals.  Cervical ROM remains limited and pt continues to work on flexibility.  Pt has been walking at home for endurance gains.  LE strength, cervical A/ROM and FOTO score have all improved.                   PT Next Visit Plan D/C PT to HEP   PT Home Exercise Plan Access Code: [email or web address]    Consulted and Agree with Plan of Care Patient            PHYSICAL THERAPY DISCHARGE SUMMARY  Visits from Start of Care: 31  Current functional level related to goals / functional outcomes: See above for current status.  Pt reports 90% overall improvement.     Remaining deficits: Intermittent pain and stiffness that limits function.  Pt has HEP in place to address remaining deficits.     Education / Equipment: HEP   Patient agrees to discharge. Patient goals were partially met. Patient is being discharged due to being pleased with the current functional level.    Sigurd Sos, PT 06/20/21 8:34 AM
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
## 2021-06-28 NOTE — Telephone Encounter (Signed)
We can discuss treatment options at her upcoming visit.

## 2021-06-28 NOTE — Telephone Encounter (Signed)
ATC patient to review Remicade infusion options since she states she can no longer do infusions on weekdays. Unable to reach patient - left VM requesting return call.  If patient cannot do infusions any longer, she may have to switch to self-injectable medication. She has OV on 07/06/21 with Hazel Sams, PA-C  Patient's last infusion was on 05/10/21 and none scheduled in future  Knox Saliva, PharmD, MPH, BCPS Clinical Pharmacist (Rheumatology and Pulmonology)

## 2021-06-29 ENCOUNTER — Ambulatory Visit (INDEPENDENT_AMBULATORY_CARE_PROVIDER_SITE_OTHER): Payer: Medicare Other | Admitting: Orthopaedic Surgery

## 2021-06-29 ENCOUNTER — Other Ambulatory Visit: Payer: Self-pay

## 2021-06-29 ENCOUNTER — Encounter: Payer: Self-pay | Admitting: Orthopaedic Surgery

## 2021-06-29 DIAGNOSIS — M5440 Lumbago with sciatica, unspecified side: Secondary | ICD-10-CM | POA: Diagnosis not present
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
## 2021-07-01 ENCOUNTER — Encounter: Payer: Self-pay | Admitting: Neurology

## 2021-07-01 ENCOUNTER — Other Ambulatory Visit: Payer: Self-pay

## 2021-07-01 ENCOUNTER — Ambulatory Visit (INDEPENDENT_AMBULATORY_CARE_PROVIDER_SITE_OTHER): Payer: Medicare Other | Admitting: Neurology

## 2021-07-01 VITALS — BP 146/79 | HR 61 | Ht 64.0 in | Wt 158.0 lb

## 2021-07-01 DIAGNOSIS — R519 Headache, unspecified: Secondary | ICD-10-CM | POA: Diagnosis not present

## 2021-07-01 DIAGNOSIS — R413 Other amnesia: Secondary | ICD-10-CM | POA: Diagnosis not present

## 2021-07-01 NOTE — Patient Instructions (Addendum)
MRI brain without contrast.  We will call you with the results   
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
## 2021-07-06 ENCOUNTER — Ambulatory Visit (INDEPENDENT_AMBULATORY_CARE_PROVIDER_SITE_OTHER): Payer: Medicare Other | Admitting: Physician Assistant

## 2021-07-06 ENCOUNTER — Other Ambulatory Visit: Payer: Self-pay

## 2021-07-06 ENCOUNTER — Encounter: Payer: Self-pay | Admitting: Physician Assistant

## 2021-07-06 ENCOUNTER — Telehealth: Payer: Self-pay | Admitting: Pharmacist

## 2021-07-06 VITALS — BP 122/77 | HR 62 | Ht 64.0 in | Wt 159.2 lb

## 2021-07-06 DIAGNOSIS — M19042 Primary osteoarthritis, left hand: Secondary | ICD-10-CM

## 2021-07-06 DIAGNOSIS — M5030 Other cervical disc degeneration, unspecified cervical region: Secondary | ICD-10-CM

## 2021-07-06 DIAGNOSIS — M19041 Primary osteoarthritis, right hand: Secondary | ICD-10-CM | POA: Diagnosis not present

## 2021-07-06 DIAGNOSIS — H15003 Unspecified scleritis, bilateral: Secondary | ICD-10-CM

## 2021-07-06 DIAGNOSIS — I10 Essential (primary) hypertension: Secondary | ICD-10-CM

## 2021-07-06 DIAGNOSIS — Z79899 Other long term (current) drug therapy: Secondary | ICD-10-CM | POA: Diagnosis not present

## 2021-07-06 DIAGNOSIS — M0579 Rheumatoid arthritis with rheumatoid factor of multiple sites without organ or systems involvement: Secondary | ICD-10-CM | POA: Diagnosis not present

## 2021-07-06 DIAGNOSIS — M8589 Other specified disorders of bone density and structure, multiple sites: Secondary | ICD-10-CM

## 2021-07-06 DIAGNOSIS — M5136 Other intervertebral disc degeneration, lumbar region: Secondary | ICD-10-CM

## 2021-07-06 DIAGNOSIS — M170 Bilateral primary osteoarthritis of knee: Secondary | ICD-10-CM

## 2021-07-06 DIAGNOSIS — Z8639 Personal history of other endocrine, nutritional and metabolic disease: Secondary | ICD-10-CM

## 2021-07-06 NOTE — Telephone Encounter (Signed)
Patient had OV today. She states she will start having Tuesdays/Wednesdays off of work starting in April and can resume infusions on those days. I advised that we could place referral to [facility] and see if they can get her into a weekend appointment in March and then she can resume weekday infusions at [facility]. I did review that I don't know if they can get her in this month or when their next weekend infusion appointment day is. She verbalized understanding ? ?Fax: [phone] ?Phone: [phone] ? ?Wendy Woods Wendy Woods, PharmD, MPH, BCPS ?Clinical Pharmacist (Rheumatology and Pulmonology) ?

## 2021-07-11 ENCOUNTER — Ambulatory Visit: Payer: Medicare Other | Admitting: Rheumatology

## 2021-07-17 ENCOUNTER — Ambulatory Visit
Admission: RE | Admit: 2021-07-17 | Discharge: 2021-07-17 | Disposition: A | Discharge status: Home / Self Care | Payer: Medicare Other | Source: Ambulatory Visit | Attending: Neurology | Admitting: Neurology

## 2021-07-17 ENCOUNTER — Other Ambulatory Visit: Payer: Self-pay

## 2021-07-17 DIAGNOSIS — R519 Headache, unspecified: Secondary | ICD-10-CM

## 2021-07-17 DIAGNOSIS — R413 Other amnesia: Secondary | ICD-10-CM

## 2021-07-18 NOTE — Telephone Encounter (Addendum)
Spoke with [employer] [employer] for update on patient's Remicade infusion referral. Per rep, they spoke with patient on 07/15/21. Her first infusion must be completed during a weekday and patient had stated that this was not possible for her. She said she would call [facility] back if she could figure out a weekday when she could come in. ? ?ATC patient to review what she'd like to do (wait until her work schedule changes to allow for weekday infusion appts). Unable to reach - left VM requesting return call for update on if she'd like to receive at [facility] moving forward or continue receiving at [facility] ? ?Chesley Mires, PharmD, MPH, BCPS ?Clinical Pharmacist (Rheumatology and Pulmonology) ?

## 2021-07-19 ENCOUNTER — Telehealth: Payer: Self-pay | Admitting: Neurology

## 2021-07-19 DIAGNOSIS — R9089 Other abnormal findings on diagnostic imaging of central nervous system: Secondary | ICD-10-CM

## 2021-07-19 NOTE — Telephone Encounter (Signed)
Patient is returning a call to someone about her results °

## 2021-07-19 NOTE — Telephone Encounter (Signed)
Pt called back in and left a message returning our call 

## 2021-07-19 NOTE — Telephone Encounter (Signed)
Results from MRI: ?Please inform pt that her MRI brain is normal, nothing worrisome to explain her headaches.  There was an incidental soft tissue structure seen at the back of the nose/throat which radiology suggested taking a better look at by ENT.  Please see if she has an ENT, otherwise we can refer her to ENT to request visualization of this area - please fax MRI results.  Thanks ?

## 2021-07-19 NOTE — Telephone Encounter (Signed)
Called patient and left a message for a call back. Patient is returning call for MRI results. See MRI results.  ?

## 2021-07-20 ENCOUNTER — Telehealth: Payer: Self-pay | Admitting: Neurology

## 2021-07-20 ENCOUNTER — Telehealth: Payer: Self-pay

## 2021-07-20 DIAGNOSIS — R9389 Abnormal findings on diagnostic imaging of other specified body structures: Secondary | ICD-10-CM

## 2021-07-20 NOTE — Telephone Encounter (Signed)
Caller left message with access nurse.  Returning a phone call from the office. ?

## 2021-07-20 NOTE — Telephone Encounter (Signed)
See previous encounter

## 2021-07-20 NOTE — Telephone Encounter (Addendum)
-----   Message from Glendale Chard, DO sent at 07/18/2021  1:13 PM EDT ----- ?Please inform pt that her MRI brain is normal, nothing worrisome to explain her headaches.  There was an incidental soft tissue structure seen at the back of the nose/throat which radiology suggested taking a better look at by ENT.  Please see if she has an ENT, otherwise we can refer her to ENT to request visualization of this area - please fax MRI results.  Thanks ?

## 2021-07-20 NOTE — Telephone Encounter (Signed)
Pt called an informed that MRI brain is normal, nothing worrisome to explain her headaches.  There was an incidental soft tissue structure seen at the back of the nose/throat which radiology suggested taking a better look at by ENT. She would like to see an ENT referral placed in Epic  ?

## 2021-07-20 NOTE — Telephone Encounter (Signed)
Called patient and informed her of MRI results and recommendations by Dr. Posey Pronto. Patient is ok with having an ENT referral sent. Patient had no further questions or concerns. ENT referral has been sent.  ?

## 2021-07-20 NOTE — Addendum Note (Signed)
Addended by: Karl Luke A on: 07/20/2021 09:03 AM ? ? Modules accepted: Orders ? ?

## 2021-07-27 ENCOUNTER — Other Ambulatory Visit: Payer: Self-pay | Admitting: Pharmacist

## 2021-07-27 ENCOUNTER — Other Ambulatory Visit (HOSPITAL_COMMUNITY): Payer: Self-pay

## 2021-07-27 ENCOUNTER — Telehealth: Payer: Self-pay | Admitting: *Deleted

## 2021-07-27 DIAGNOSIS — M0579 Rheumatoid arthritis with rheumatoid factor of multiple sites without organ or systems involvement: Secondary | ICD-10-CM

## 2021-07-27 DIAGNOSIS — Z79899 Other long term (current) drug therapy: Secondary | ICD-10-CM

## 2021-07-27 NOTE — Progress Notes (Addendum)
Next infusion not yet scheduled for Remicade and due for updated orders. Reviewed with Sherron Ales, PA-C about resuming previous dose. ? ?Diagnosis: RA ? ?Dose: 5mg /kg every 5 weeks (300 mg based on last recorded weight of 72.2kg) ? ?Last Clinic Visit: 07/06/21 ?Next Clinic Visit: 12/28/21 ? ?Last infusion: 05/10/21 ? ?Labs: 04/04/21 ?TB Gold: negative on 01/24/21  ? ?Orders placed for Remicade x 2 doses along with premedication of acetaminophen and diphenhydramine to be administered 30 minutes before medication infusion. ? ?Standing CBC with diff/platelet and CMP with GFR orders placed to be drawn every 10 weeks (with every other infusion).  Next TB gold due 01/27/22 ? ?Patient has phone number for [facility] and will call to schedule infusion. Will follow-up to ensured scheduled and completed ? ?Chesley Mires, PharmD, MPH, BCPS ?Clinical Pharmacist (Rheumatology and Pulmonology) ? ?

## 2021-07-27 NOTE — Telephone Encounter (Addendum)
Returned call to patient. She still has [employer] [place] and Medicaid - no insurance changes. She states she will have Tuesdays and Wednesdays off.  ? ?She confirmed that she has [facility] phone number and will call them to schedule an infusion to get restarted on treatment. ? ?Her last Remicade infusion on 05/10/21. ?  ?Chesley Mires, PharmD, MPH, BCPS ?Clinical Pharmacist (Rheumatology and Pulmonology) ?

## 2021-07-27 NOTE — Telephone Encounter (Signed)
Patient states she is no longer working in [employer]. Patient would like to go back to receiving her infusions at [facility]. Please advise patient.  ?

## 2021-07-29 NOTE — Progress Notes (Signed)
Patient scheduled Remicade infusion on 08/02/21. Will f/u to ensure completed ? ?Chesley Mires, PharmD, MPH, BCPS ?Clinical Pharmacist (Rheumatology and Pulmonology) ?

## 2021-08-02 ENCOUNTER — Telehealth: Payer: Self-pay | Admitting: Neurology

## 2021-08-02 ENCOUNTER — Encounter (HOSPITAL_COMMUNITY)
Admission: RE | Admit: 2021-08-02 | Discharge: 2021-08-02 | Disposition: A | Discharge status: Home / Self Care | Payer: Medicare Other | Source: Ambulatory Visit | Attending: Rheumatology | Admitting: Rheumatology

## 2021-08-02 DIAGNOSIS — Z79899 Other long term (current) drug therapy: Secondary | ICD-10-CM | POA: Diagnosis present

## 2021-08-02 DIAGNOSIS — M0579 Rheumatoid arthritis with rheumatoid factor of multiple sites without organ or systems involvement: Secondary | ICD-10-CM | POA: Insufficient documentation

## 2021-08-02 LAB — CBC WITH DIFFERENTIAL/PLATELET
Abs Immature Granulocytes: 0.02 10*3/uL (ref 0.00–0.07)
Basophils Absolute: 0.1 10*3/uL (ref 0.0–0.1)
Basophils Relative: 1 %
Eosinophils Absolute: 0.3 10*3/uL (ref 0.0–0.5)
Eosinophils Relative: 5 %
HCT: 36.8 % (ref 36.0–46.0)
Hemoglobin: 13 g/dL (ref 12.0–15.0)
Immature Granulocytes: 0 %
Lymphocytes Relative: 42 %
Lymphs Abs: 2.8 10*3/uL (ref 0.7–4.0)
MCH: 31 pg (ref 26.0–34.0)
MCHC: 35.3 g/dL (ref 30.0–36.0)
MCV: 87.6 fL (ref 80.0–100.0)
Monocytes Absolute: 0.4 10*3/uL (ref 0.1–1.0)
Monocytes Relative: 6 %
Neutro Abs: 3 10*3/uL (ref 1.7–7.7)
Neutrophils Relative %: 46 %
Platelets: 400 10*3/uL (ref 150–400)
RBC: 4.2 MIL/uL (ref 3.87–5.11)
RDW: 12.5 % (ref 11.5–15.5)
WBC: 6.6 10*3/uL (ref 4.0–10.5)
nRBC: 0 % (ref 0.0–0.2)

## 2021-08-02 LAB — COMPREHENSIVE METABOLIC PANEL
ALT: 15 U/L (ref 0–44)
AST: 18 U/L (ref 15–41)
Albumin: 3.6 g/dL (ref 3.5–5.0)
Alkaline Phosphatase: 51 U/L (ref 38–126)
Anion gap: 9 (ref 5–15)
BUN: 13 mg/dL (ref 6–20)
CO2: 30 mmol/L (ref 22–32)
Calcium: 9.7 mg/dL (ref 8.9–10.3)
Chloride: 95 mmol/L — ABNORMAL LOW (ref 98–111)
Creatinine, Ser: 1.03 mg/dL — ABNORMAL HIGH (ref 0.44–1.00)
GFR, Estimated: >60 mL/min (ref >60)
Glucose, Bld: 342 mg/dL — ABNORMAL HIGH (ref 70–99)
Potassium: 3.8 mmol/L (ref 3.5–5.1)
Sodium: 134 mmol/L — ABNORMAL LOW (ref 135–145)
Total Bilirubin: 1 mg/dL (ref 0.3–1.2)
Total Protein: 7.7 g/dL (ref 6.5–8.1)

## 2021-08-02 MED ORDER — ACETAMINOPHEN 325 MG PO TABS: 650.0000 mg | ORAL_TABLET | ORAL | Status: DC

## 2021-08-02 MED ORDER — DIPHENHYDRAMINE HCL 25 MG PO CAPS: 25.0000 mg | ORAL_CAPSULE | ORAL | Status: DC

## 2021-08-02 MED ORDER — SODIUM CHLORIDE 0.9 % IV SOLN
5.0000 mg/kg | INTRAVENOUS | Status: DC
  Administered 2021-08-02: 300 mg via INTRAVENOUS
  Filled 2021-08-02: qty 30

## 2021-08-02 NOTE — Progress Notes (Signed)
Glucose is elevated-342.  Please notify the patient.   ?Sodium and chloride are borderline low but have improved.  ?Creatinine is borderline elevated-1.03.  GFR is WNL.  Patient was likely dehydrated.  She should avoid the use of NSAIDs.

## 2021-08-02 NOTE — Progress Notes (Signed)
CBC WNL

## 2021-08-02 NOTE — Telephone Encounter (Signed)
ENT Referral sent to: ? ?Su Raynelle Bring, MD., PA [facility] [place] ?[address] ?[address] ?[place], [place] [address] ?[phone] ? ?Called patient and informed her that we have sent her referral to the above and she may call to check the status. Patient requested I send information to her [facility]. Mychart message sent.  ?

## 2021-08-02 NOTE — Telephone Encounter (Signed)
Patient called and stated that she was supposed to have a referral sent to ear, nose and throat dr?  She was following up. ?

## 2021-08-09 ENCOUNTER — Telehealth: Payer: Self-pay | Admitting: Neurology

## 2021-08-09 DIAGNOSIS — R9389 Abnormal findings on diagnostic imaging of other specified body structures: Secondary | ICD-10-CM

## 2021-08-09 NOTE — Telephone Encounter (Signed)
Called patient and provided her with [facility] phone number. Informed patient that it can take a few days to process referrals. Patient requested information be sent to her MyChart. Patient had no further questions or concerns.  ? ?Mychart message sent.  ?

## 2021-08-09 NOTE — Telephone Encounter (Signed)
Pt called in and left a message with the access nurse. She stated the ENT the referral was sent to only see's pediatric patients. She will need a new referral. ?

## 2021-08-09 NOTE — Telephone Encounter (Signed)
Called patient and left a message for a call back. Need to inform patient that her new referral was sent to [facility]. Phone#:[phone]. ?

## 2021-08-09 NOTE — Telephone Encounter (Signed)
Patient called and stated that a referral was sent to Dr Danny LawlessSuhwooi.  She stated that they told her they are not accepting new patients, can the referral be sent somewhere else. ?

## 2021-09-06 ENCOUNTER — Ambulatory Visit (HOSPITAL_COMMUNITY)
Admission: RE | Admit: 2021-09-06 | Discharge: 2021-09-06 | Disposition: A | Discharge status: Home / Self Care | Payer: Medicare Other | Source: Ambulatory Visit | Attending: Rheumatology | Admitting: Rheumatology

## 2021-09-06 ENCOUNTER — Other Ambulatory Visit: Payer: Self-pay | Admitting: Pharmacist

## 2021-09-06 DIAGNOSIS — M0579 Rheumatoid arthritis with rheumatoid factor of multiple sites without organ or systems involvement: Secondary | ICD-10-CM | POA: Insufficient documentation

## 2021-09-06 DIAGNOSIS — Z79899 Other long term (current) drug therapy: Secondary | ICD-10-CM

## 2021-09-06 MED ORDER — SODIUM CHLORIDE 0.9 % IV SOLN
5.0000 mg/kg | INTRAVENOUS | Status: DC
  Administered 2021-09-06: 300 mg via INTRAVENOUS
  Filled 2021-09-06: qty 30

## 2021-09-06 MED ORDER — DIPHENHYDRAMINE HCL 25 MG PO CAPS: 25.0000 mg | ORAL_CAPSULE | ORAL | Status: DC

## 2021-09-06 MED ORDER — ACETAMINOPHEN 325 MG PO TABS: 650.0000 mg | ORAL_TABLET | ORAL | Status: DC

## 2021-09-06 NOTE — Progress Notes (Signed)
Next infusion scheduled for Remicade on 10/11/21 and due for updated orders. ?Diagnosis: RA ? ?Dose: 5mg /kg every 5 weeks (300mg  based on last recorded weight of 68.4 kg) ? ?Last Clinic Visit: 07/06/21 ?Next Clinic Visit: 12/28/21 ? ?Last infusion: 09/06/21 ? ?Labs: 08/02/21 (CBC, CMP) - elevated glucose, creatinine borderline elevated ?TB Gold: negative on 01/24/21  ? ?Orders placed for Remicade x 2 doses along with premedication of acetaminophen and diphenhydramine to be administered 30 minutes before medication infusion. ? ?Standing CBC with diff/platelet and CMP with GFR orders placed to be drawn every 2 months.  Next TB gold due 01/24/22 ? ?Chesley Mires, PharmD, MPH, BCPS, CPP ?Clinical Pharmacist (Rheumatology and Pulmonology) ?

## 2021-10-11 ENCOUNTER — Other Ambulatory Visit: Payer: Self-pay | Admitting: Otolaryngology

## 2021-10-11 ENCOUNTER — Ambulatory Visit (HOSPITAL_COMMUNITY)
Admission: RE | Admit: 2021-10-11 | Discharge: 2021-10-11 | Disposition: A | Discharge status: Home / Self Care | Payer: Medicare Other | Source: Ambulatory Visit | Attending: Rheumatology | Admitting: Rheumatology

## 2021-10-11 DIAGNOSIS — M0579 Rheumatoid arthritis with rheumatoid factor of multiple sites without organ or systems involvement: Secondary | ICD-10-CM | POA: Diagnosis present

## 2021-10-11 DIAGNOSIS — Z79899 Other long term (current) drug therapy: Secondary | ICD-10-CM | POA: Diagnosis present

## 2021-10-11 LAB — COMPREHENSIVE METABOLIC PANEL
ALT: 16 U/L (ref 0–44)
AST: 18 U/L (ref 15–41)
Albumin: 3.5 g/dL (ref 3.5–5.0)
Alkaline Phosphatase: 46 U/L (ref 38–126)
Anion gap: 9 (ref 5–15)
BUN: 12 mg/dL (ref 6–20)
CO2: 30 mmol/L (ref 22–32)
Calcium: 9.6 mg/dL (ref 8.9–10.3)
Chloride: 97 mmol/L — ABNORMAL LOW (ref 98–111)
Creatinine, Ser: 0.83 mg/dL (ref 0.44–1.00)
GFR, Estimated: >60 mL/min (ref >60)
Glucose, Bld: 271 mg/dL — ABNORMAL HIGH (ref 70–99)
Potassium: 3.2 mmol/L — ABNORMAL LOW (ref 3.5–5.1)
Sodium: 136 mmol/L (ref 135–145)
Total Bilirubin: 0.6 mg/dL (ref 0.3–1.2)
Total Protein: 7.6 g/dL (ref 6.5–8.1)

## 2021-10-11 LAB — CBC WITH DIFFERENTIAL/PLATELET
Abs Immature Granulocytes: 0.01 10*3/uL (ref 0.00–0.07)
Basophils Absolute: 0 10*3/uL (ref 0.0–0.1)
Basophils Relative: 1 %
Eosinophils Absolute: 0.2 10*3/uL (ref 0.0–0.5)
Eosinophils Relative: 4 %
HCT: 36.8 % (ref 36.0–46.0)
Hemoglobin: 12.4 g/dL (ref 12.0–15.0)
Immature Granulocytes: 0 %
Lymphocytes Relative: 45 %
Lymphs Abs: 2.8 10*3/uL (ref 0.7–4.0)
MCH: 30.3 pg (ref 26.0–34.0)
MCHC: 33.7 g/dL (ref 30.0–36.0)
MCV: 90 fL (ref 80.0–100.0)
Monocytes Absolute: 0.4 10*3/uL (ref 0.1–1.0)
Monocytes Relative: 6 %
Neutro Abs: 2.7 10*3/uL (ref 1.7–7.7)
Neutrophils Relative %: 44 %
Platelets: 363 10*3/uL (ref 150–400)
RBC: 4.09 MIL/uL (ref 3.87–5.11)
RDW: 12.3 % (ref 11.5–15.5)
WBC: 6.2 10*3/uL (ref 4.0–10.5)
nRBC: 0 % (ref 0.0–0.2)

## 2021-10-11 MED ORDER — DIPHENHYDRAMINE HCL 25 MG PO CAPS: 25.0000 mg | ORAL_CAPSULE | ORAL | Status: DC

## 2021-10-11 MED ORDER — ACETAMINOPHEN 325 MG PO TABS: 650.0000 mg | ORAL_TABLET | ORAL | Status: DC

## 2021-10-11 MED ORDER — SODIUM CHLORIDE 0.9 % IV SOLN
5.0000 mg/kg | INTRAVENOUS | Status: DC
  Administered 2021-10-11: 300 mg via INTRAVENOUS
  Filled 2021-10-11: qty 30

## 2021-10-11 NOTE — Progress Notes (Signed)
Glucose is elevated at 271.  Potassium is low at 3.2.  Please notify the patient.  Please forward results to PCP.  She will need to have her potassium rechecked. CBC within normal limits.

## 2021-10-12 ENCOUNTER — Telehealth: Payer: Self-pay | Admitting: Rheumatology

## 2021-10-12 NOTE — Telephone Encounter (Signed)
Patient called the office requesting a call back regarding her lab results. 

## 2021-10-23 ENCOUNTER — Emergency Department (HOSPITAL_BASED_OUTPATIENT_CLINIC_OR_DEPARTMENT_OTHER): Payer: Medicare Other

## 2021-10-23 ENCOUNTER — Encounter (HOSPITAL_BASED_OUTPATIENT_CLINIC_OR_DEPARTMENT_OTHER): Payer: Self-pay

## 2021-10-23 ENCOUNTER — Other Ambulatory Visit: Payer: Self-pay

## 2021-10-23 ENCOUNTER — Emergency Department (HOSPITAL_BASED_OUTPATIENT_CLINIC_OR_DEPARTMENT_OTHER)
Admission: EM | Admit: 2021-10-23 | Discharge: 2021-10-23 | Disposition: A | Discharge status: Home / Self Care | Payer: Medicare Other | Attending: Student | Admitting: Student

## 2021-10-23 DIAGNOSIS — E119 Type 2 diabetes mellitus without complications: Secondary | ICD-10-CM | POA: Insufficient documentation

## 2021-10-23 DIAGNOSIS — J36 Peritonsillar abscess: Secondary | ICD-10-CM | POA: Diagnosis not present

## 2021-10-23 DIAGNOSIS — Z96652 Presence of left artificial knee joint: Secondary | ICD-10-CM | POA: Diagnosis not present

## 2021-10-23 DIAGNOSIS — Z91040 Latex allergy status: Secondary | ICD-10-CM | POA: Diagnosis not present

## 2021-10-23 DIAGNOSIS — I10 Essential (primary) hypertension: Secondary | ICD-10-CM | POA: Insufficient documentation

## 2021-10-23 DIAGNOSIS — Z7984 Long term (current) use of oral hypoglycemic drugs: Secondary | ICD-10-CM | POA: Insufficient documentation

## 2021-10-23 DIAGNOSIS — Z79899 Other long term (current) drug therapy: Secondary | ICD-10-CM | POA: Diagnosis not present

## 2021-10-23 DIAGNOSIS — J029 Acute pharyngitis, unspecified: Secondary | ICD-10-CM | POA: Diagnosis present

## 2021-10-23 DIAGNOSIS — J020 Streptococcal pharyngitis: Secondary | ICD-10-CM

## 2021-10-23 LAB — CBC WITH DIFFERENTIAL/PLATELET
Abs Immature Granulocytes: 0.1 10*3/uL — ABNORMAL HIGH (ref 0.00–0.07)
Basophils Absolute: 0 10*3/uL (ref 0.0–0.1)
Basophils Relative: 0 %
Eosinophils Absolute: 0.1 10*3/uL (ref 0.0–0.5)
Eosinophils Relative: 0 %
HCT: 38.8 % (ref 36.0–46.0)
Hemoglobin: 13 g/dL (ref 12.0–15.0)
Immature Granulocytes: 1 %
Lymphocytes Relative: 16 %
Lymphs Abs: 2.5 10*3/uL (ref 0.7–4.0)
MCH: 29.4 pg (ref 26.0–34.0)
MCHC: 33.5 g/dL (ref 30.0–36.0)
MCV: 87.8 fL (ref 80.0–100.0)
Monocytes Absolute: 0.9 10*3/uL (ref 0.1–1.0)
Monocytes Relative: 6 %
Neutro Abs: 12 10*3/uL — ABNORMAL HIGH (ref 1.7–7.7)
Neutrophils Relative %: 77 %
Platelets: 427 10*3/uL — ABNORMAL HIGH (ref 150–400)
RBC: 4.42 MIL/uL (ref 3.87–5.11)
RDW: 12 % (ref 11.5–15.5)
WBC: 15.6 10*3/uL — ABNORMAL HIGH (ref 4.0–10.5)
nRBC: 0 % (ref 0.0–0.2)

## 2021-10-23 LAB — COMPREHENSIVE METABOLIC PANEL
ALT: 11 U/L (ref 0–44)
AST: 10 U/L — ABNORMAL LOW (ref 15–41)
Albumin: 4.2 g/dL (ref 3.5–5.0)
Alkaline Phosphatase: 70 U/L (ref 38–126)
Anion gap: 12 (ref 5–15)
BUN: 12 mg/dL (ref 6–20)
CO2: 32 mmol/L (ref 22–32)
Calcium: 10.9 mg/dL — ABNORMAL HIGH (ref 8.9–10.3)
Chloride: 88 mmol/L — ABNORMAL LOW (ref 98–111)
Creatinine, Ser: 0.82 mg/dL (ref 0.44–1.00)
GFR, Estimated: >60 mL/min (ref >60)
Glucose, Bld: 251 mg/dL — ABNORMAL HIGH (ref 70–99)
Potassium: 3.4 mmol/L — ABNORMAL LOW (ref 3.5–5.1)
Sodium: 132 mmol/L — ABNORMAL LOW (ref 135–145)
Total Bilirubin: 0.6 mg/dL (ref 0.3–1.2)
Total Protein: 9.5 g/dL — ABNORMAL HIGH (ref 6.5–8.1)

## 2021-10-23 LAB — GROUP A STREP BY PCR: Group A Strep by PCR: DETECTED — AB

## 2021-10-23 MED ORDER — CLINDAMYCIN HCL 150 MG PO CAPS
300.0000 mg | ORAL_CAPSULE | Freq: Three times a day (TID) | ORAL | 0 refills | Status: AC
Start: 2021-10-23 — End: ?

## 2021-10-23 MED ORDER — IOHEXOL 300 MG/ML  SOLN
100.0000 mL | Freq: Once | INTRAMUSCULAR | Status: AC | PRN
  Administered 2021-10-23: 75 mL via INTRAVENOUS

## 2021-10-23 MED ORDER — LIDOCAINE VISCOUS HCL 2 % MT SOLN
15.0000 mL | Freq: Once | OROMUCOSAL | Status: AC
  Administered 2021-10-23: 15 mL via OROMUCOSAL
  Filled 2021-10-23: qty 15

## 2021-10-23 MED ORDER — CLINDAMYCIN HCL 150 MG PO CAPS
300.0000 mg | ORAL_CAPSULE | Freq: Once | ORAL | Status: AC
  Administered 2021-10-23: 300 mg via ORAL
  Filled 2021-10-23: qty 2

## 2021-10-23 MED ORDER — DEXAMETHASONE 10 MG/ML FOR PEDIATRIC ORAL USE
10.0000 mg | Freq: Once | INTRAMUSCULAR | Status: AC
Start: 2021-10-23 — End: 2021-10-23
  Administered 2021-10-23: 10 mg via ORAL
  Filled 2021-10-23: qty 1

## 2021-10-23 MED ORDER — BUTAMBEN-TETRACAINE-BENZOCAINE 2-2-14 % EX AERO
1.0000 | INHALATION_SPRAY | Freq: Once | CUTANEOUS | Status: DC
Start: 2021-10-23 — End: 2021-10-23

## 2021-10-23 MED ORDER — DEXAMETHASONE 1 MG/ML PO CONC: 10.0000 mg | Freq: Once | ORAL | Status: DC

## 2021-10-23 NOTE — ED Provider Notes (Signed)
[facility] EMERGENCY DEPT Provider Note  CSN: [id number] Arrival date & time: 10/23/21 1327  Chief Complaint(s) Sore Throat  HPI Wendy Woods is a 61 y.o. 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  Patient states that symptoms have been gradually worsening over the last 3 days and now she is having difficulty tolerating p.o.  She also noticed some muffling of her voice.  She endorses left-sided jaw and neck pain the patient has mild trismus.  Denies fever, chest pain, shortness of breath, Donnell pain, nausea, vomiting or other systemic symptoms.   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   Type II diabetes mellitus (HCC)    Patient Active Problem List   Diagnosis Date Noted   Leg pain, bilateral 02/22/2021   Low back pain 01/12/2021   Osteoarthritis cervical spine 01/12/2021   Pain in left foot 12/15/2020   Unilateral primary osteoarthritis, left knee 01/10/2018   Chronic pain of left knee 01/10/2018   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 Authorizing Provider  clindamycin (CLEOCIN) 150 MG capsule Take 2 capsules (300 mg total) by mouth 3 (three) times daily. 10/23/21  Yes Audric Venn, MD   acetaminophen (TYLENOL) 500 MG tablet Take 1,000 mg by mouth every 6 (six) hours as needed for pain.    [provider]  amLODipine (NORVASC) 5 MG tablet Take 1 tablet (5 mg total) by mouth daily. Patient not taking: Reported on 07/06/2021 07/16/14   Drema Dallas, MD  atorvastatin (LIPITOR) 40 MG tablet Take 40 mg by mouth daily.    [provider]  cetirizine (ZYRTEC ALLERGY) 10 MG tablet Take 1 tablet (10 mg total) by mouth daily. 05/20/17   Wallis Bamberg, PA-C  cyclobenzaprine (FLEXERIL) 10 MG tablet Take 1 tablet (10 mg total) by mouth 2 (two) times daily as needed for muscle spasms. 12/06/20   Curatolo, Adam, DO  famotidine (PEPCID) 20 MG tablet Take 1 tablet (20 mg total) by mouth 2 (two) times daily. Patient taking differently: Take 20 mg by mouth as needed. 07/16/14   Drema Dallas, MD  folic acid (FOLVITE) 1 MG tablet Take 1 mg by mouth daily.    [provider]  glimepiride (AMARYL) 2 MG tablet TAKE 1 TABLET BY MOUTH EVERY DAY WITH BREAKFAST OR THE FIRST MAIN MEAL OF THE DAY Patient not taking: Reported on 07/06/2021    [provider]  hydrochlorothiazide (HYDRODIURIL) 12.5 MG tablet Take 12.5 mg by mouth daily.    [provider]  inFLIXimab (REMICADE) 100 MG injection Inject into the vein. Patient not taking: Reported on 07/06/2021    [provider]  potassium chloride (K-DUR) 10 MEQ tablet Take 10 mEq by mouth daily. 03/03/16   [provider]  TRULICITY 0.75 MG/0.5ML SOPN Inject  0.75 mg into the skin once a week. 08/15/20   [provider]                                                                                                                                    Past Surgical History Past Surgical History:  Procedure Laterality Date   APPENDECTOMY  1968   CATARACT EXTRACTION W/ INTRAOCULAR LENS  IMPLANT, BILATERAL Bilateral 11/2013-12/2013   KNEE ARTHROPLASTY     KNEE ARTHROSCOPY Left 1980's-1990's X 3    MYOMECTOMY  1990's   Family History Family History  Problem Relation Age of Onset   Hypertension Mother    Anxiety disorder Mother    Colon polyps Father    Prostate cancer Father    Breast cancer Cousin 35   Colon cancer Neg Hx     Social History Social History   Tobacco Use   Smoking status: Never    Passive exposure: Never   Smokeless tobacco: Never  Vaping Use   Vaping Use: Never used  Substance Use Topics   Alcohol use: No    Alcohol/week: 0.0 standard drinks of alcohol   Drug use: No   Allergies Latex, Lisinopril, Penicillins, and Methotrexate derivatives  Review of Systems Review of Systems  HENT:  Positive for sore throat, trouble swallowing and voice change.     Physical Exam Vital Signs  I have reviewed the triage vital signs BP (!) 178/83 (BP Location: Right Arm)   Pulse 89   Temp 98.7 F (37.1 C) (Oral)   Resp 16   LMP 07/09/2014   SpO2 97%   Physical Exam Vitals and nursing note reviewed.  Constitutional:      General: She is not in acute distress.    Appearance: She is well-developed.  HENT:     Head: Normocephalic and atraumatic.     Mouth/Throat:     Pharynx: Pharyngeal swelling and posterior oropharyngeal erythema present.     Tonsils: Tonsillar exudate present. 1+ on the right. 3+ on the left.  Eyes:     Conjunctiva/sclera: Conjunctivae normal.  Cardiovascular:     Rate and Rhythm: Normal rate and regular rhythm.     Heart sounds: No murmur heard. Pulmonary:     Effort: Pulmonary effort is normal. No respiratory distress.     Breath sounds: Normal breath sounds.  Abdominal:     Palpations: Abdomen is soft.     Tenderness: There is no abdominal tenderness.  Musculoskeletal:        General: No swelling.     Cervical back: Neck supple.  Skin:    General: Skin is warm and dry.     Capillary Refill: Capillary refill takes less than 2 seconds.  Neurological:     Mental Status: She is alert.  Psychiatric:        Mood and Affect:  Mood normal.     ED Results and Treatments Labs (  all labs ordered are listed, but only abnormal results are displayed) Labs Reviewed  GROUP A STREP BY PCR - Abnormal; Notable for the following components:      Result Value   Group A Strep by PCR DETECTED (*)    All other components within normal limits  CBC WITH DIFFERENTIAL/PLATELET - Abnormal; Notable for the following components:   WBC 15.6 (*)    Platelets 427 (*)    Neutro Abs 12.0 (*)    Abs Immature Granulocytes 0.10 (*)    All other components within normal limits  COMPREHENSIVE METABOLIC PANEL - Abnormal; Notable for the following components:   Sodium 132 (*)    Potassium 3.4 (*)    Chloride 88 (*)    Glucose, Bld 251 (*)    Calcium 10.9 (*)    Total Protein 9.5 (*)    AST 10 (*)    All other components within normal limits                                                                                                                          Radiology CT Soft Tissue Neck W Contrast  Result Date: 10/23/2021 CLINICAL DATA:  Neck mass. Positive strep test. Rule out peritonsillar abscess. Sore throat. EXAM: CT NECK WITH CONTRAST TECHNIQUE: Multidetector CT imaging of the neck was performed using the standard protocol following the bolus administration of intravenous contrast. RADIATION DOSE REDUCTION: This exam was performed according to the departmental dose-optimization program which includes automated exposure control, adjustment of the mA and/or kV according to patient size and/or use of iterative reconstruction technique. CONTRAST:  75mL OMNIPAQUE IOHEXOL 300 MG/ML  SOLN COMPARISON:  None Available. FINDINGS: Pharynx and larynx: Asymmetric enlargement left tonsil. Fluid collection within the left tonsil compatible with abscess measuring 9 x 15 mm. There is edema in the left lateral pharyngeal wall extending down to the piriform sinus which is effaced. Epiglottis and larynx normal. Airway patent. Salivary glands: No  inflammation, mass, or stone. Thyroid: 11 mm left thyroid nodule. No further imaging necessary. (Ref: J Am Coll Radiol. 2015 Feb;12(2): 143-50). Lymph nodes: Scattered lymph nodes in the neck bilaterally are not pathologic but likely reactive due to infection. Largest lymph node left level 2/3 node measures 12.5 mm in diameter. Vascular: Atherosclerotic calcification in the carotid artery bilaterally without stenosis. Normal venous enhancement. Limited intracranial: Negative Visualized orbits: Negative Mastoids and visualized paranasal sinuses: Negative Skeleton: Cervical spondylosis.  No acute skeletal abnormality. Upper chest: Lung apices clear bilaterally Other: None IMPRESSION: Swelling of the left tonsil with central abscess measuring 9 x 15 mm. Edema extends caudally in the left lateral pharynx to the piriform sinus. Airway patent. Reactive adenopathy in the neck due to infection. Electronically Signed   By: Marlan Palau M.D.   On: 10/23/2021 18:03    Pertinent labs & imaging results that were available during my care of the patient were reviewed by me and considered in my medical decision making (see  MDM for details).  Medications Ordered in ED Medications  lidocaine (XYLOCAINE) 2 % viscous mouth solution 15 mL (15 mLs Mouth/Throat Given 10/23/21 1619)  dexamethasone (DECADRON) 10 MG/ML injection for Pediatric ORAL use 10 mg (10 mg Oral Given 10/23/21 1620)  iohexol (OMNIPAQUE) 300 MG/ML solution 100 mL (75 mLs Intravenous Contrast Given 10/23/21 1721)  clindamycin (CLEOCIN) capsule 300 mg (300 mg Oral Given 10/23/21 1845)                                                                                                                                     Procedures Procedures  (including critical care time)  Medical Decision Making / ED Course   This patient presents to the ED for concern of sore throat, this involves an extensive number of treatment options, and is a complaint that carries  with it a high risk of complications and morbidity.  The differential diagnosis includes strep throat, PTA, RPA, viral pharyngitis, COVID-19, influenza  MDM: Patient seen emergency room for evaluation of sore throat.  Physical exam reveals left greater than right tonsillar swelling with oropharyngeal erythema and tonsillar exudate.  Laboratory evaluation with leukocytosis to 15.6 with a neutrophilic predominance, hyponatremia 132, hypokalemia at 3.4, hypochloremia to 88.  Patient is strep positive.  Given concern for PTA, CT soft tissue neck obtained that shows an 9 x 15 mm PTA with edema extending caudally into the left lateral pharynx.  ENT consulted and who recommended antibiotics, steroids and discharged with outpatient follow-up with no drainage in the ER.  Patient is allergic to all penicillins and thus she was given oral clindamycin which has the same bioavailability as IV clindamycin, 10 of oral Decadron and viscous lidocaine.  On reevaluation, symptoms improved.  Patient will follow-up outpatient with ENT and she was discharged with return precautions of which she voiced understanding   Additional history obtained: -Additional history obtained from husband -External records from outside source obtained and reviewed including: Chart review including previous notes, labs, imaging, consultation notes   Lab Tests: -I ordered, reviewed, and interpreted labs.   The pertinent results include:   Labs Reviewed  GROUP A STREP BY PCR - Abnormal; Notable for the following components:      Result Value   Group A Strep by PCR DETECTED (*)    All other components within normal limits  CBC WITH DIFFERENTIAL/PLATELET - Abnormal; Notable for the following components:   WBC 15.6 (*)    Platelets 427 (*)    Neutro Abs 12.0 (*)    Abs Immature Granulocytes 0.10 (*)    All other components within normal limits  COMPREHENSIVE METABOLIC PANEL - Abnormal; Notable for the following components:   Sodium 132  (*)    Potassium 3.4 (*)    Chloride 88 (*)    Glucose, Bld 251 (*)    Calcium 10.9 (*)    Total Protein 9.5 (*)  AST 10 (*)    All other components within normal limits          Imaging Studies ordered: I ordered imaging studies including CT soft tissue neck I independently visualized and interpreted imaging. I agree with the radiologist interpretation   Medicines ordered and prescription drug management: Meds ordered this encounter  Medications   DISCONTD: dexamethasone (DECADRON) 1 MG/ML solution 10 mg   lidocaine (XYLOCAINE) 2 % viscous mouth solution 15 mL   dexamethasone (DECADRON) 10 MG/ML injection for Pediatric ORAL use 10 mg   iohexol (OMNIPAQUE) 300 MG/ML solution 100 mL   clindamycin (CLEOCIN) capsule 300 mg   DISCONTD: butamben-tetracaine-benzocaine (CETACAINE) spray 1 spray   clindamycin (CLEOCIN) 150 MG capsule    Sig: Take 2 capsules (300 mg total) by mouth 3 (three) times daily.    Dispense:  28 capsule    Refill:  0    -I have reviewed the patients home medicines and have made adjustments as needed  Critical interventions none  Consultations Obtained: I requested consultation with the ENT on-call,  and discussed lab and imaging findings as well as pertinent plan - they recommend: Clindamycin, steroids and outpatient follow-up   Cardiac Monitoring: The patient was maintained on a cardiac monitor.  I personally viewed and interpreted the cardiac monitored which showed an underlying rhythm of: NSR  Social Determinants of Health:  Factors impacting patients care include: none   Reevaluation: After the interventions noted above, I reevaluated the patient and found that they have :improved  Co morbidities that complicate the patient evaluation  Past Medical History:  Diagnosis Date   DDD (degenerative disc disease), cervical 03/22/2016   DDD (degenerative disc disease), lumbar 03/22/2016   GERD (gastroesophageal reflux disease)     Hyperlipidemia    Hypertension    Rheumatoid arthritis (HCC)    Rheumatoid arthritis(714.0)    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     Final Clinical Impression(s) / ED Diagnoses Final diagnoses:  Strep pharyngitis  Peritonsillar abscess     @PCDICTATION @    Glendora Score, MD 10/23/21 2304

## 2021-11-03 ENCOUNTER — Encounter (HOSPITAL_BASED_OUTPATIENT_CLINIC_OR_DEPARTMENT_OTHER): Payer: Self-pay | Admitting: Otolaryngology

## 2021-11-03 ENCOUNTER — Other Ambulatory Visit: Payer: Self-pay

## 2021-11-10 ENCOUNTER — Encounter (HOSPITAL_BASED_OUTPATIENT_CLINIC_OR_DEPARTMENT_OTHER)
Admission: RE | Admit: 2021-11-10 | Discharge: 2021-11-10 | Disposition: A | Discharge status: Home / Self Care | Payer: Medicare Other | Source: Ambulatory Visit | Attending: Otolaryngology | Admitting: Otolaryngology

## 2021-11-10 DIAGNOSIS — Z01818 Encounter for other preprocedural examination: Secondary | ICD-10-CM | POA: Insufficient documentation

## 2021-11-10 LAB — BASIC METABOLIC PANEL
Anion gap: 9 (ref 5–15)
BUN: 12 mg/dL (ref 6–20)
CO2: 28 mmol/L (ref 22–32)
Calcium: 9.3 mg/dL (ref 8.9–10.3)
Chloride: 98 mmol/L (ref 98–111)
Creatinine, Ser: 0.8 mg/dL (ref 0.44–1.00)
GFR, Estimated: >60 mL/min (ref >60)
Glucose, Bld: 333 mg/dL — ABNORMAL HIGH (ref 70–99)
Potassium: 3.9 mmol/L (ref 3.5–5.1)
Sodium: 135 mmol/L (ref 135–145)

## 2021-11-15 ENCOUNTER — Encounter (HOSPITAL_COMMUNITY): Payer: Medicare Other

## 2021-11-15 ENCOUNTER — Encounter (HOSPITAL_COMMUNITY): Payer: Self-pay | Admitting: Otolaryngology

## 2021-11-15 ENCOUNTER — Other Ambulatory Visit: Payer: Self-pay

## 2021-11-15 NOTE — Progress Notes (Signed)
Wendy Woods denies chest pain or shortness of breath. Patient denies having any s/s of Covid in her household.  Patient denies any known exposure to Covid.   Wendy Woods has Type II diabetes, Glucose was 333 when she was in the ED 11/10/21.  Wendy Woods reports that she checks CBG 2 times a day and that it runs 120-150. Patient is not sure what her last A1C was done. Wendy Woods reports that she sees Dr. Horald Pollen - endocrinologist.  I instructed patient to check CBG after awaking and every 2 hours until arrival  to the hospital. I Instructed Wendy Woods  if CBG is less than 70 to take 4 Glucose Tablets or 1 tube of Glucose Gel or 1/2 cup of a clear juice. Recheck CBG in 15 minutes if CBG is not over 70 call, pre- op desk at [phone] for further instructions. I instructed Wendy Woods that if CBg is greater than 220 to take 1/2 of SS Humolog Insulin and to call the pre- surgery desk.

## 2021-11-16 ENCOUNTER — Ambulatory Visit (HOSPITAL_BASED_OUTPATIENT_CLINIC_OR_DEPARTMENT_OTHER): Payer: Medicare Other | Admitting: Certified Registered Nurse Anesthetist

## 2021-11-16 ENCOUNTER — Ambulatory Visit (HOSPITAL_COMMUNITY): Payer: Medicare Other | Admitting: Certified Registered Nurse Anesthetist

## 2021-11-16 ENCOUNTER — Other Ambulatory Visit: Payer: Self-pay

## 2021-11-16 ENCOUNTER — Encounter (HOSPITAL_COMMUNITY): Payer: Medicare Other

## 2021-11-16 ENCOUNTER — Ambulatory Visit (HOSPITAL_COMMUNITY)
Admission: RE | Admit: 2021-11-16 | Discharge: 2021-11-16 | Disposition: A | Discharge status: Home / Self Care | Payer: Medicare Other | Attending: Otolaryngology | Admitting: Otolaryngology

## 2021-11-16 ENCOUNTER — Encounter (HOSPITAL_COMMUNITY): Payer: Self-pay | Admitting: Otolaryngology

## 2021-11-16 ENCOUNTER — Encounter (HOSPITAL_COMMUNITY)
Admission: RE | Disposition: A | Discharge status: Home / Self Care | Payer: Self-pay | Source: Home / Self Care | Attending: Otolaryngology

## 2021-11-16 DIAGNOSIS — I10 Essential (primary) hypertension: Secondary | ICD-10-CM

## 2021-11-16 DIAGNOSIS — J392 Other diseases of pharynx: Secondary | ICD-10-CM | POA: Diagnosis not present

## 2021-11-16 DIAGNOSIS — R895 Abnormal microbiological findings in specimens from other organs, systems and tissues: Secondary | ICD-10-CM | POA: Diagnosis not present

## 2021-11-16 DIAGNOSIS — Z794 Long term (current) use of insulin: Secondary | ICD-10-CM

## 2021-11-16 DIAGNOSIS — E1165 Type 2 diabetes mellitus with hyperglycemia: Secondary | ICD-10-CM

## 2021-11-16 DIAGNOSIS — E119 Type 2 diabetes mellitus without complications: Secondary | ICD-10-CM | POA: Insufficient documentation

## 2021-11-16 DIAGNOSIS — Z01818 Encounter for other preprocedural examination: Secondary | ICD-10-CM

## 2021-11-16 DIAGNOSIS — J349 Unspecified disorder of nose and nasal sinuses: Secondary | ICD-10-CM | POA: Diagnosis present

## 2021-11-16 HISTORY — DX: Unspecified asthma, uncomplicated: J45.909

## 2021-11-16 HISTORY — DX: Other seasonal allergic rhinitis: J30.2

## 2021-11-16 HISTORY — PX: NASOPHARYNGEAL BIOPSY: SHX6488

## 2021-11-16 LAB — BASIC METABOLIC PANEL
Anion gap: 9 (ref 5–15)
BUN: 7 mg/dL (ref 6–20)
CO2: 33 mmol/L — ABNORMAL HIGH (ref 22–32)
Calcium: 10 mg/dL (ref 8.9–10.3)
Chloride: 96 mmol/L — ABNORMAL LOW (ref 98–111)
Creatinine, Ser: 0.7 mg/dL (ref 0.44–1.00)
GFR, Estimated: >60 mL/min (ref >60)
Glucose, Bld: 307 mg/dL — ABNORMAL HIGH (ref 70–99)
Potassium: 3.6 mmol/L (ref 3.5–5.1)
Sodium: 138 mmol/L (ref 135–145)

## 2021-11-16 LAB — CBC
HCT: 37.2 % (ref 36.0–46.0)
Hemoglobin: 12.8 g/dL (ref 12.0–15.0)
MCH: 30.4 pg (ref 26.0–34.0)
MCHC: 34.4 g/dL (ref 30.0–36.0)
MCV: 88.4 fL (ref 80.0–100.0)
Platelets: 291 10*3/uL (ref 150–400)
RBC: 4.21 MIL/uL (ref 3.87–5.11)
RDW: 13.2 % (ref 11.5–15.5)
WBC: 5 10*3/uL (ref 4.0–10.5)
nRBC: 0 % (ref 0.0–0.2)

## 2021-11-16 LAB — GLUCOSE, CAPILLARY
Glucose-Capillary: 328 mg/dL — ABNORMAL HIGH (ref 70–99)
Glucose-Capillary: 336 mg/dL — ABNORMAL HIGH (ref 70–99)
Glucose-Capillary: 312 mg/dL — ABNORMAL HIGH (ref 70–99)
Glucose-Capillary: 276 mg/dL — ABNORMAL HIGH (ref 70–99)

## 2021-11-16 SURGERY — BIOPSY, NASOPHARYNX
Anesthesia: General | Site: Mouth

## 2021-11-16 MED ORDER — LIDOCAINE 2% (20 MG/ML) 5 ML SYRINGE
INTRAMUSCULAR | Status: DC | PRN
  Administered 2021-11-16: 40 mg via INTRAVENOUS

## 2021-11-16 MED ORDER — CHLORHEXIDINE GLUCONATE 0.12 % MT SOLN
15.0000 mL | Freq: Once | OROMUCOSAL | Status: AC
  Administered 2021-11-16: 15 mL via OROMUCOSAL
  Filled 2021-11-16: qty 15

## 2021-11-16 MED ORDER — ROCURONIUM BROMIDE 10 MG/ML (PF) SYRINGE
PREFILLED_SYRINGE | INTRAVENOUS | Status: DC | PRN
  Administered 2021-11-16: 60 mg via INTRAVENOUS

## 2021-11-16 MED ORDER — VANCOMYCIN HCL IN DEXTROSE 1-5 GM/200ML-% IV SOLN
1000.0000 mg | INTRAVENOUS | Status: AC
  Administered 2021-11-16: 1000 mg via INTRAVENOUS
  Filled 2021-11-16: qty 200

## 2021-11-16 MED ORDER — FENTANYL CITRATE (PF) 250 MCG/5ML IJ SOLN
INTRAMUSCULAR | Status: AC
  Filled 2021-11-16: qty 5

## 2021-11-16 MED ORDER — FENTANYL CITRATE (PF) 250 MCG/5ML IJ SOLN
INTRAMUSCULAR | Status: DC | PRN
  Administered 2021-11-16: 50 ug via INTRAVENOUS
  Administered 2021-11-16: 100 ug via INTRAVENOUS

## 2021-11-16 MED ORDER — LACTATED RINGERS IV SOLN: INTRAVENOUS | Status: DC

## 2021-11-16 MED ORDER — ORAL CARE MOUTH RINSE: 15.0000 mL | Freq: Once | OROMUCOSAL | Status: AC

## 2021-11-16 MED ORDER — PROPOFOL 10 MG/ML IV BOLUS
INTRAVENOUS | Status: AC
  Filled 2021-11-16: qty 20

## 2021-11-16 MED ORDER — PROPOFOL 10 MG/ML IV BOLUS
INTRAVENOUS | Status: DC | PRN
  Administered 2021-11-16: 100 mg via INTRAVENOUS

## 2021-11-16 MED ORDER — ONDANSETRON HCL 4 MG/2ML IJ SOLN
INTRAMUSCULAR | Status: AC
  Filled 2021-11-16: qty 2

## 2021-11-16 MED ORDER — SUCCINYLCHOLINE CHLORIDE 200 MG/10ML IV SOSY
PREFILLED_SYRINGE | INTRAVENOUS | Status: AC
  Filled 2021-11-16: qty 10

## 2021-11-16 MED ORDER — LIDOCAINE 2% (20 MG/ML) 5 ML SYRINGE
INTRAMUSCULAR | Status: AC
  Filled 2021-11-16: qty 15

## 2021-11-16 MED ORDER — SUGAMMADEX SODIUM 200 MG/2ML IV SOLN
INTRAVENOUS | Status: DC | PRN
  Administered 2021-11-16 (×2): 100 mg via INTRAVENOUS

## 2021-11-16 MED ORDER — DEXMEDETOMIDINE (PRECEDEX) IN NS 20 MCG/5ML (4 MCG/ML) IV SYRINGE
PREFILLED_SYRINGE | INTRAVENOUS | Status: DC | PRN
  Administered 2021-11-16: 4 ug via INTRAVENOUS
  Administered 2021-11-16: 8 ug via INTRAVENOUS

## 2021-11-16 MED ORDER — ROCURONIUM BROMIDE 10 MG/ML (PF) SYRINGE
PREFILLED_SYRINGE | INTRAVENOUS | Status: AC
Start: 2021-11-16 — End: ?
  Filled 2021-11-16: qty 20

## 2021-11-16 MED ORDER — DEXAMETHASONE SODIUM PHOSPHATE 10 MG/ML IJ SOLN
INTRAMUSCULAR | Status: AC
  Filled 2021-11-16: qty 1

## 2021-11-16 MED ORDER — MIDAZOLAM HCL 2 MG/2ML IJ SOLN
INTRAMUSCULAR | Status: AC
  Filled 2021-11-16: qty 2

## 2021-11-16 MED ORDER — 0.9 % SODIUM CHLORIDE (POUR BTL) OPTIME
TOPICAL | Status: DC | PRN
  Administered 2021-11-16: 1000 mL

## 2021-11-16 MED ORDER — MIDAZOLAM HCL 2 MG/2ML IJ SOLN
INTRAMUSCULAR | Status: DC | PRN
  Administered 2021-11-16: 1 mg via INTRAVENOUS

## 2021-11-16 MED ORDER — ONDANSETRON HCL 4 MG/2ML IJ SOLN
INTRAMUSCULAR | Status: DC | PRN
  Administered 2021-11-16: 4 mg via INTRAVENOUS

## 2021-11-16 MED ORDER — INSULIN ASPART 100 UNIT/ML IJ SOLN
0.0000 [IU] | INTRAMUSCULAR | Status: DC | PRN
  Administered 2021-11-16: 7 [IU] via SUBCUTANEOUS
  Filled 2021-11-16: qty 1

## 2021-11-16 MED ORDER — DEXAMETHASONE SODIUM PHOSPHATE 10 MG/ML IJ SOLN
INTRAMUSCULAR | Status: DC | PRN
  Administered 2021-11-16: 4 mg via INTRAVENOUS

## 2021-11-16 SURGICAL SUPPLY — 31 items
BAG COUNTER SPONGE SURGICOUNT (BAG) ×2 IMPLANT
BAG SPNG CNTER NS LX DISP (BAG) ×1
CANISTER SUCT 3000ML PPV (MISCELLANEOUS) ×2 IMPLANT
CATH FOLEY LATEX FREE 16FR (CATHETERS) ×2
CATH FOLEY LF 16FR (CATHETERS) IMPLANT
CLEANER TIP ELECTROSURG 2X2 (MISCELLANEOUS) ×2 IMPLANT
COAGULATOR SUCT SWTCH 10FR 6 (ELECTROSURGICAL) ×2 IMPLANT
DRAPE HALF SHEET 40X57 (DRAPES) ×1 IMPLANT
ELECT COATED BLADE 2.86 ST (ELECTRODE) ×2 IMPLANT
ELECT REM PT RETURN 9FT ADLT (ELECTROSURGICAL) ×2
ELECTRODE REM PT RTRN 9FT ADLT (ELECTROSURGICAL) IMPLANT
GAUZE 4X4 16PLY ~~LOC~~+RFID DBL (SPONGE) ×2 IMPLANT
GLOVE BIO SURGEON STRL SZ7.5 (GLOVE) ×2 IMPLANT
GOWN STRL REUS W/ TWL LRG LVL3 (GOWN DISPOSABLE) ×2 IMPLANT
GOWN STRL REUS W/TWL LRG LVL3 (GOWN DISPOSABLE) ×4
KIT BASIN OR (CUSTOM PROCEDURE TRAY) ×2 IMPLANT
KIT TURNOVER KIT B (KITS) ×2 IMPLANT
NS IRRIG 1000ML POUR BTL (IV SOLUTION) ×2 IMPLANT
PACK SURGICAL SETUP 50X90 (CUSTOM PROCEDURE TRAY) ×2 IMPLANT
PAD ARMBOARD 7.5X6 YLW CONV (MISCELLANEOUS) ×2 IMPLANT
PENCIL SMOKE EVACUATOR (MISCELLANEOUS) ×2 IMPLANT
POSITIONER HEAD DONUT 9IN (MISCELLANEOUS) ×1 IMPLANT
SPECIMEN JAR SMALL (MISCELLANEOUS) ×4 IMPLANT
SPONGE TONSIL 1.25 RF SGL STRG (GAUZE/BANDAGES/DRESSINGS) ×2 IMPLANT
SPONGE TONSIL TAPE 1 RFD (DISPOSABLE) ×1 IMPLANT
SYR BULB EAR ULCER 3OZ GRN STR (SYRINGE) ×2 IMPLANT
TOWEL GREEN STERILE FF (TOWEL DISPOSABLE) ×2 IMPLANT
TUBE CONNECTING 12X1/4 (SUCTIONS) ×2 IMPLANT
TUBE SALEM SUMP 16 FR W/ARV (TUBING) ×2 IMPLANT
WATER STERILE IRR 1000ML POUR (IV SOLUTION) ×2 IMPLANT
YANKAUER SUCT BULB TIP NO VENT (SUCTIONS) ×2 IMPLANT

## 2021-11-16 NOTE — Anesthesia Preprocedure Evaluation (Addendum)
Anesthesia Evaluation  Patient identified by MRN, date of birth, ID band Patient awake    Reviewed: Allergy & Precautions, NPO status , Patient's Chart, lab work & pertinent test results  Airway Mallampati: II  TM Distance: >3 FB Neck ROM: Full    Dental  (+) Missing, Dental Advisory Given,    Pulmonary asthma ,    Pulmonary exam normal breath sounds clear to auscultation       Cardiovascular hypertension, Pt. on medications Normal cardiovascular exam Rhythm:Regular Rate:Normal     Neuro/Psych negative neurological ROS  negative psych ROS   GI/Hepatic Neg liver ROS, GERD  ,  Endo/Other  diabetes, Poorly Controlled, Type 2, Insulin Dependent  Renal/GU negative Renal ROS  negative genitourinary   Musculoskeletal  (+) Arthritis , Rheumatoid disorders,    Abdominal   Peds  Hematology negative hematology ROS (+)   Anesthesia Other Findings   Reproductive/Obstetrics                            Anesthesia Physical Anesthesia Plan  ASA: 3  Anesthesia Plan: General   Post-op Pain Management: Tylenol PO (pre-op)*   Induction:   PONV Risk Score and Plan: 3 and Propofol infusion, Midazolam and Ondansetron  Airway Management Planned: Oral ETT, Natural Airway and Mask  Additional Equipment:   Intra-op Plan:   Post-operative Plan:   Informed Consent: I have reviewed the patients History and Physical, chart, labs and discussed the procedure including the risks, benefits and alternatives for the proposed anesthesia with the patient or authorized representative who has indicated his/her understanding and acceptance.     Dental advisory given  Plan Discussed with: CRNA  Anesthesia Plan Comments:         Anesthesia Quick Evaluation

## 2021-11-16 NOTE — Brief Op Note (Signed)
11/16/2021  10:56 AM  PATIENT:  Wendy Woods  61 y.o. female  PRE-OPERATIVE DIAGNOSIS:  Nasopharyngeal mass  POST-OPERATIVE DIAGNOSIS:  same  PROCEDURE:  Procedure(s): NASOPHARYNGEAL BIOPSY (N/A)  SURGEON:  Surgeon(s) and Role:    * Christia Reading, MD - Primary  PHYSICIAN ASSISTANT:   ASSISTANTS: none   ANESTHESIA:   general  EBL:  20 cc   BLOOD ADMINISTERED:none  DRAINS: none   LOCAL MEDICATIONS USED:  NONE  SPECIMEN:  Source of Specimen:  Nasopharyngeal mass  DISPOSITION OF SPECIMEN:  PATHOLOGY  COUNTS:  YES  TOURNIQUET:  * No tourniquets in log *  DICTATION: .Note written in EPIC  PLAN OF CARE: Discharge to home after PACU  PATIENT DISPOSITION:  PACU - hemodynamically stable.   Delay start of Pharmacological VTE agent (>24hrs) due to surgical blood loss or risk of bleeding: no

## 2021-11-16 NOTE — Anesthesia Postprocedure Evaluation (Signed)
Anesthesia Post Note  Patient: Wendy Woods  Procedure(s) Performed: NASOPHARYNGEAL BIOPSY (Mouth)     Patient location during evaluation: PACU Anesthesia Type: General Level of consciousness: awake and alert Pain management: pain level controlled Vital Signs Assessment: post-procedure vital signs reviewed and stable Respiratory status: spontaneous breathing, nonlabored ventilation, respiratory function stable and patient connected to nasal cannula oxygen Cardiovascular status: blood pressure returned to baseline and stable Postop Assessment: no apparent nausea or vomiting Anesthetic complications: no   No notable events documented.  Last Vitals:  Vitals:   11/16/21 1130 11/16/21 1135  BP: (!) 142/68 (!) 147/83  Pulse: 64 64  Resp: 10 11  Temp:  36.7 C  SpO2: 93% 93%    Last Pain:  Vitals:   11/16/21 1135  TempSrc:   PainSc: 0-No pain                 Leatta Alewine L Shaquinta Peruski

## 2021-11-16 NOTE — Op Note (Signed)
Preop diagnosis: Nasopharyngeal mass Postop diagnosis: same Procedure: Nasopharyngeal biopsy Surgeon: Jenne Pane Anesth: General Compl: None Findings: Central posterior nasopharyngeal wall prominent lymphoid-appearing tissue Description:  After discussing risks, benefits, and alternatives, the patient was brought to the operative suite and placed on the operative table in the supine position.  Anesthesia was induced and the patient was intubated by the anesthesia team without difficulty.  The bed was turned 90 degrees from anesthesia and the eyes were taped closed.  The patient was given IV Decadron.  A head wrap was placed around the patient's head and the oropharynx was exposed with a Crow-Davis retractor that was placed in suspension on the Mayo stand.  A catheter was passed through the right nasal passage and pulled through the mouth to provide anterior retraction on the soft palate.  A laryngeal mirror was inserted to view the nasopharynx.  The central posterior nasopharyngeal wall tissue was amputated using an adenoid curette and passed to nursing for pathology.  Bleeding was controlled with suction cautery and an Afrin-soaked pack placed for a few minutes and then removed.  After this was completed, the catheter was removed and the mouth and nose were copiously irrigated with saline.  A flexible suction catheter was passed down the esophagus to suction out the stomach and esophagus.  The Crow-Davis retractor was taken out of suspension and removed from the patient's mouth.  She was then turned back to anesthesia for wake-up and was extubated and moved to the recovery room in stable condition.

## 2021-11-16 NOTE — H&P (Signed)
Wendy Woods is an 61 y.o. female.   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  She presents for excisional biopsy.  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 She has never been exposed to tobacco smoke. She has never used smokeless tobacco. She reports that she does not drink alcohol and does not use drugs.  Allergies:  Allergies  Allergen Reactions   Latex Anaphylaxis, Hives, Itching, Swelling, Rash and Other (See Comments)    Causes bad reactions.     Lisinopril Shortness Of Breath and Swelling    Face and tongue swelling, difficulty breathing, had to be admitted to the hospital   Penicillins Anaphylaxis, Hives, Itching, Nausea And Vomiting, Rash and Other (See Comments)    Loss of consciousness also.    Methotrexate Derivatives Nausea Only    Medications Prior to Admission  Medication Sig Dispense Refill   acetaminophen (TYLENOL) 500 MG tablet Take 1,000 mg by mouth every 6 (six) hours as needed for pain.     amLODipine (NORVASC) 5 MG tablet Take 1 tablet (5 mg total)  by mouth daily. 30 tablet 0   atorvastatin (LIPITOR) 40 MG tablet Take 40 mg by mouth daily.     cetirizine (ZYRTEC ALLERGY) 10 MG tablet Take 1 tablet (10 mg total) by mouth daily. 30 tablet 11   clindamycin (CLEOCIN) 150 MG capsule Take 2 capsules (300 mg total) by mouth 3 (three) times daily. 28 capsule 0   ezetimibe (ZETIA) 10 MG tablet Take 10 mg by mouth daily.     famotidine (PEPCID) 20 MG tablet Take 1 tablet (20 mg total) by mouth 2 (two) times daily. (Patient taking differently: Take 20 mg by mouth as needed.) 6 tablet 0   folic acid (FOLVITE) 1 MG tablet Take 1 mg by mouth daily.     HUMALOG KWIKPEN 100 UNIT/ML KwikPen Inject 5-10 Units into the skin 3 (three) times daily before meals. Dose per sliding scale?     hydrochlorothiazide (HYDRODIURIL) 12.5 MG tablet Take 12.5 mg by mouth daily.     inFLIXimab (REMICADE) 100 MG injection Inject into the vein.     methylPREDNISolone (MEDROL DOSEPAK) 4 MG TBPK tablet Take by mouth as directed. (Typical regimens for 21 tablet dose packs of Methylprednisolone 4mg , Prednisone 5mg , and Prednisone 10mg ) Day 1: 2 tabs before breakfast, 1 tab after lunch, 1 tab after supper, and 2 tabs at bedtime. Day 2: 1 tab before breakfast, 1 tab  after lunch, 1 tab after supper, and 2 tabs at bedtime. Day 3: 1 tab before breakfast, 1 tab after lunch, 1 tab after supper, and 1 tab at bedtime. Day 4: 1 tab before breakfast, 1 tab after lunch, and 1 tab at bedtime. Day 5: 1 tab before breakfast and 1 tab at bedtime. Day 6: 1 tab before breakfast.     potassium chloride (K-DUR) 10 MEQ tablet Take 10 mEq by mouth daily.     TRULICITY 0.75 MG/0.5ML SOPN Inject 0.75 mg into the skin once a week. Takes on Friday     cyclobenzaprine (FLEXERIL) 10 MG tablet Take 1 tablet (10 mg total) by mouth 2 (two) times daily as needed for muscle spasms. 20 tablet 0   glimepiride (AMARYL) 2 MG tablet TAKE 1 TABLET BY MOUTH EVERY DAY WITH BREAKFAST OR THE FIRST MAIN MEAL OF THE DAY (Patient  not taking: Reported on 07/06/2021)      Results for orders placed or performed during the hospital encounter of 11/16/21 (from the past 48 hour(s))  Glucose, capillary     Status: Abnormal   Collection Time: 11/16/21  7:30 AM  Result Value Ref Range   Glucose-Capillary 328 (H) 70 - 99 mg/dL    Comment: Glucose reference range applies only to samples taken after fasting for at least 8 hours.  Glucose, capillary     Status: Abnormal   Collection Time: 11/16/21  9:06 AM  Result Value Ref Range   Glucose-Capillary 336 (H) 70 - 99 mg/dL    Comment: Glucose reference range applies only to samples taken after fasting for at least 8 hours.  Basic metabolic panel per protocol     Status: Abnormal   Collection Time: 11/16/21  9:37 AM  Result Value Ref Range   Sodium 138 135 - 145 mmol/L   Potassium 3.6 3.5 - 5.1 mmol/L   Chloride 96 (L) 98 - 111 mmol/L   CO2 33 (H) 22 - 32 mmol/L   Glucose, Bld 307 (H) 70 - 99 mg/dL    Comment: Glucose reference range applies only to samples taken after fasting for at least 8 hours.   BUN 7 6 - 20 mg/dL   Creatinine, Ser 5.85 0.44 - 1.00 mg/dL   Calcium 27.7 8.9 - 82.4 mg/dL   GFR, Estimated >23 >53 mL/min    Comment: (NOTE) Calculated using the CKD-EPI Creatinine Equation (2021)    Anion gap 9 5 - 15    Comment: Performed at [facility], [name], [place], [name] [address]  CBC per protocol     Status: None   Collection Time: 11/16/21  9:37 AM  Result Value Ref Range   WBC 5.0 4.0 - 10.5 K/uL   RBC 4.21 3.87 - 5.11 MIL/uL   Hemoglobin 12.8 12.0 - 15.0 g/dL   HCT 15.4 00.8 - 67.6 %   MCV 88.4 80.0 - 100.0 fL   MCH 30.4 26.0 - 34.0 pg   MCHC 34.4 30.0 - 36.0 g/dL   RDW 19.5 09.3 - 26.7 %   Platelets 291 150 - 400 K/uL   nRBC 0.0 0.0 - 0.2 %    Comment: Performed at [facility], [address] [place], [name] [address]  Glucose, capillary     Status: Abnormal   Collection Time: 11/16/21 10:11 AM  Result Value Ref Range    Glucose-Capillary 312 (H) 70 - 99 mg/dL    Comment: Glucose reference range applies only to samples  taken after fasting for at least 8 hours.   Comment 1 Notify RN    Comment 2 Document in Chart    No results found.  Review of Systems  All other systems reviewed and are negative.   Blood pressure (!) 142/72, pulse 61, temperature 98 F (36.7 C), temperature source Oral, resp. rate 18, height 5\' 4"  (1.626 m), weight 63.5 kg, last menstrual period 07/09/2014, SpO2 99 %. Physical Exam Constitutional:      Appearance: Normal appearance. She is normal weight.  HENT:     Head: Normocephalic and atraumatic.     Right Ear: External ear normal.     Left Ear: External ear normal.     Nose: Nose normal.     Mouth/Throat:     Mouth: Mucous membranes are moist.     Pharynx: Oropharynx is clear.  Eyes:     Extraocular Movements: Extraocular movements intact.     Conjunctiva/sclera: Conjunctivae normal.     Pupils: Pupils are equal, round, and reactive to light.  Cardiovascular:     Rate and Rhythm: Normal rate.  Pulmonary:     Effort: Pulmonary effort is normal.  Musculoskeletal:     Cervical back: Normal range of motion.  Skin:    General: Skin is warm and dry.  Neurological:     General: No focal deficit present.     Mental Status: She is alert and oriented to person, place, and time.  Psychiatric:        Mood and Affect: Mood normal.        Behavior: Behavior normal.        Thought Content: Thought content normal.        Judgment: Judgment normal.      Assessment/Plan Nasopharyngeal mass  To OR for nasopharyngeal mass removal.  09/08/2014, MD 11/16/2021, 10:16 AM

## 2021-11-16 NOTE — Transfer of Care (Signed)
Immediate Anesthesia Transfer of Care Note  Patient: Wendy Woods  Procedure(s) Performed: NASOPHARYNGEAL BIOPSY (Mouth)  Patient Location: PACU  Anesthesia Type:General  Level of Consciousness: awake and confused  Airway & Oxygen Therapy: Patient Spontanous Breathing and Patient connected to face mask oxygen  Post-op Assessment: Report given to RN, Post -op Vital signs reviewed and stable and Patient moving all extremities X 4  Post vital signs: Reviewed and stable  Last Vitals:  Vitals Value Taken Time  BP 175/91 11/16/21 1112  Temp    Pulse 76 11/16/21 1115  Resp 19 11/16/21 1115  SpO2 99 % 11/16/21 1115  Vitals shown include unvalidated device data.  Last Pain:  Vitals:   11/16/21 0818  TempSrc:   PainSc: 0-No pain         Complications: No notable events documented.

## 2021-11-16 NOTE — Anesthesia Procedure Notes (Signed)
Procedure Name: Intubation Date/Time: 11/16/2021 10:37 AM  Performed by: Merryl Hacker, RNPre-anesthesia Checklist: Patient identified, Emergency Drugs available, Suction available and Patient being monitored Patient Re-evaluated:Patient Re-evaluated prior to induction Oxygen Delivery Method: Circle System Utilized Preoxygenation: Pre-oxygenation with 100% oxygen Induction Type: IV induction Ventilation: Mask ventilation without difficulty Laryngoscope Size: Mac and 3 Grade View: Grade I Tube type: Oral Tube size: 7.0 mm Number of attempts: 1 Airway Equipment and Method: Stylet Placement Confirmation: ETT inserted through vocal cords under direct vision, positive ETCO2 and breath sounds checked- equal and bilateral Secured at: 22 cm Tube secured with: Tape Dental Injury: Teeth and Oropharynx as per pre-operative assessment  Comments: Eyes taped prior to DL. DL x1 with MAC 3 grade 1 view, 7.0 ETT gently passed through cords. +chest rise and fall, +BBS, +EtCO2. Performed by Heide Scales, SRNA

## 2021-11-17 ENCOUNTER — Encounter (HOSPITAL_COMMUNITY): Payer: Self-pay | Admitting: Otolaryngology

## 2021-12-07 ENCOUNTER — Encounter (HOSPITAL_COMMUNITY): Payer: Medicare Other

## 2021-12-07 LAB — SURGICAL PATHOLOGY

## 2021-12-13 ENCOUNTER — Encounter (HOSPITAL_COMMUNITY): Payer: Self-pay | Admitting: Otolaryngology

## 2021-12-14 ENCOUNTER — Encounter (HOSPITAL_COMMUNITY): Payer: Medicare Other
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
## 2021-12-20 ENCOUNTER — Other Ambulatory Visit: Payer: Self-pay | Admitting: Pharmacist

## 2021-12-20 ENCOUNTER — Ambulatory Visit (HOSPITAL_COMMUNITY)
Admission: RE | Admit: 2021-12-20 | Discharge: 2021-12-20 | Disposition: A | Discharge status: Home / Self Care | Payer: Medicare Other | Source: Ambulatory Visit | Attending: Rheumatology | Admitting: Rheumatology

## 2021-12-20 DIAGNOSIS — M0579 Rheumatoid arthritis with rheumatoid factor of multiple sites without organ or systems involvement: Secondary | ICD-10-CM | POA: Insufficient documentation

## 2021-12-20 DIAGNOSIS — Z79899 Other long term (current) drug therapy: Secondary | ICD-10-CM

## 2021-12-20 DIAGNOSIS — Z111 Encounter for screening for respiratory tuberculosis: Secondary | ICD-10-CM

## 2021-12-20 LAB — CBC WITH DIFFERENTIAL/PLATELET
Abs Immature Granulocytes: 0.02 10*3/uL (ref 0.00–0.07)
Basophils Absolute: 0 10*3/uL (ref 0.0–0.1)
Basophils Relative: 0 %
Eosinophils Absolute: 0.3 10*3/uL (ref 0.0–0.5)
Eosinophils Relative: 4 %
HCT: 37 % (ref 36.0–46.0)
Hemoglobin: 12.5 g/dL (ref 12.0–15.0)
Immature Granulocytes: 0 %
Lymphocytes Relative: 28 %
Lymphs Abs: 2 10*3/uL (ref 0.7–4.0)
MCH: 30 pg (ref 26.0–34.0)
MCHC: 33.8 g/dL (ref 30.0–36.0)
MCV: 88.7 fL (ref 80.0–100.0)
Monocytes Absolute: 0.5 10*3/uL (ref 0.1–1.0)
Monocytes Relative: 7 %
Neutro Abs: 4.4 10*3/uL (ref 1.7–7.7)
Neutrophils Relative %: 61 %
Platelets: 380 10*3/uL (ref 150–400)
RBC: 4.17 MIL/uL (ref 3.87–5.11)
RDW: 12.9 % (ref 11.5–15.5)
WBC: 7.3 10*3/uL (ref 4.0–10.5)
nRBC: 0 % (ref 0.0–0.2)

## 2021-12-20 LAB — COMPREHENSIVE METABOLIC PANEL
ALT: 16 U/L (ref 0–44)
AST: 19 U/L (ref 15–41)
Albumin: 3.5 g/dL (ref 3.5–5.0)
Alkaline Phosphatase: 60 U/L (ref 38–126)
Anion gap: 11 (ref 5–15)
BUN: 13 mg/dL (ref 6–20)
CO2: 26 mmol/L (ref 22–32)
Calcium: 9.5 mg/dL (ref 8.9–10.3)
Chloride: 96 mmol/L — ABNORMAL LOW (ref 98–111)
Creatinine, Ser: 0.76 mg/dL (ref 0.44–1.00)
GFR, Estimated: >60 mL/min (ref >60)
Glucose, Bld: 398 mg/dL — ABNORMAL HIGH (ref 70–99)
Potassium: 3.7 mmol/L (ref 3.5–5.1)
Sodium: 133 mmol/L — ABNORMAL LOW (ref 135–145)
Total Bilirubin: 0.4 mg/dL (ref 0.3–1.2)
Total Protein: 7.6 g/dL (ref 6.5–8.1)

## 2021-12-20 MED ORDER — DIPHENHYDRAMINE HCL 25 MG PO CAPS: 25.0000 mg | ORAL_CAPSULE | ORAL | Status: DC

## 2021-12-20 MED ORDER — ACETAMINOPHEN 325 MG PO TABS: 650.0000 mg | ORAL_TABLET | ORAL | Status: DC

## 2021-12-20 MED ORDER — SODIUM CHLORIDE 0.9 % IV SOLN
5.0000 mg/kg | INTRAVENOUS | Status: DC
  Administered 2021-12-20: 300 mg via INTRAVENOUS
  Filled 2021-12-20: qty 30

## 2021-12-20 NOTE — Progress Notes (Signed)
Next infusion scheduled for Remicade on 01/18/22 and due for updated orders. Diagnosis: RA  Dose: 5mg /kg every 5 weeks  Last Clinic Visit: *** Next Clinic Visit: 12/28/21  Last infusion: 12/20/21  Labs: *** TB Gold: negative on 01/24/21   Orders placed for Remicade x 2 doses along with premedication of acetaminophen and diphenhydramine to be administered 30 minutes before medication infusion.  Standing CBC with diff/platelet and CMP with GFR orders placed to be drawn every 2 months.  Next TB gold due 01/24/22. Order placed for TB gold to be drawn with upcoming infusion  01/26/22, PharmD, MPH, BCPS, CPP Clinical Pharmacist (Rheumatology and Pulmonology)

## 2021-12-20 NOTE — Progress Notes (Signed)
CBC is normal.  Glucose is elevated at 398 and sodium is low.  Please notify patient.  Please forward results to her PCP.

## 2021-12-26 ENCOUNTER — Other Ambulatory Visit: Payer: Self-pay | Admitting: Internal Medicine

## 2021-12-26 DIAGNOSIS — Z1231 Encounter for screening mammogram for malignant neoplasm of breast: Secondary | ICD-10-CM

## 2021-12-28 ENCOUNTER — Encounter: Payer: Self-pay | Admitting: Rheumatology

## 2021-12-28 ENCOUNTER — Ambulatory Visit: Payer: Medicare Other | Attending: Rheumatology | Admitting: Rheumatology

## 2021-12-28 VITALS — BP 126/81 | HR 66 | Resp 14 | Ht 64.0 in | Wt 137.4 lb

## 2021-12-28 DIAGNOSIS — H15003 Unspecified scleritis, bilateral: Secondary | ICD-10-CM | POA: Diagnosis not present

## 2021-12-28 DIAGNOSIS — M19041 Primary osteoarthritis, right hand: Secondary | ICD-10-CM | POA: Diagnosis not present

## 2021-12-28 DIAGNOSIS — M5030 Other cervical disc degeneration, unspecified cervical region: Secondary | ICD-10-CM

## 2021-12-28 DIAGNOSIS — I10 Essential (primary) hypertension: Secondary | ICD-10-CM

## 2021-12-28 DIAGNOSIS — M0579 Rheumatoid arthritis with rheumatoid factor of multiple sites without organ or systems involvement: Secondary | ICD-10-CM

## 2021-12-28 DIAGNOSIS — M19042 Primary osteoarthritis, left hand: Secondary | ICD-10-CM

## 2021-12-28 DIAGNOSIS — M8589 Other specified disorders of bone density and structure, multiple sites: Secondary | ICD-10-CM

## 2021-12-28 DIAGNOSIS — M7061 Trochanteric bursitis, right hip: Secondary | ICD-10-CM

## 2021-12-28 DIAGNOSIS — Z8639 Personal history of other endocrine, nutritional and metabolic disease: Secondary | ICD-10-CM

## 2021-12-28 DIAGNOSIS — M170 Bilateral primary osteoarthritis of knee: Secondary | ICD-10-CM

## 2021-12-28 DIAGNOSIS — M51369 Other intervertebral disc degeneration, lumbar region without mention of lumbar back pain or lower extremity pain: Secondary | ICD-10-CM

## 2021-12-28 DIAGNOSIS — M5136 Other intervertebral disc degeneration, lumbar region: Secondary | ICD-10-CM

## 2021-12-28 DIAGNOSIS — Z79899 Other long term (current) drug therapy: Secondary | ICD-10-CM | POA: Diagnosis not present

## 2021-12-28 MED ORDER — LEFLUNOMIDE 10 MG PO TABS: ORAL_TABLET | ORAL | 0 refills | Status: AC

## 2021-12-28 NOTE — Progress Notes (Signed)
Pharmacy Note  Subjective: Patient presents today to the [facility] for follow up office visit.  Patient seen by the pharmacist for counseling on leflunomide Ranae Plumber) for rheumatoid arthritis. She currently receives Remicade infusions every 5 weeks  Objective: CBC    Component Value Date/Time   WBC 7.3 12/20/2021 0824   RBC 4.17 12/20/2021 0824   HGB 12.5 12/20/2021 0824   HCT 37.0 12/20/2021 0824   PLT 380 12/20/2021 0824   MCV 88.7 12/20/2021 0824   MCH 30.0 12/20/2021 0824   MCHC 33.8 12/20/2021 0824   RDW 12.9 12/20/2021 0824   LYMPHSABS 2.0 12/20/2021 0824   MONOABS 0.5 12/20/2021 0824   EOSABS 0.3 12/20/2021 0824   BASOSABS 0.0 12/20/2021 0824    CMP     Component Value Date/Time   NA 133 (L) 12/20/2021 0824   K 3.7 12/20/2021 0824   CL 96 (L) 12/20/2021 0824   CO2 26 12/20/2021 0824   GLUCOSE 398 (H) 12/20/2021 0824   BUN 13 12/20/2021 0824   CREATININE 0.76 12/20/2021 0824   CALCIUM 9.5 12/20/2021 0824   PROT 7.6 12/20/2021 0824   ALBUMIN 3.5 12/20/2021 0824   AST 19 12/20/2021 0824   ALT 16 12/20/2021 0824   ALKPHOS 60 12/20/2021 0824   BILITOT 0.4 12/20/2021 0824   GFRNONAA >60 12/20/2021 0824   GFRAA >60 12/17/2019 0913    Baseline Immunosuppressant Therapy Labs    Latest Ref Rng & Units 01/24/2021    8:23 AM  Quantiferon TB Gold  Quantiferon TB Gold Plus Negative Negative       Latest Ref Rng & Units 12/20/2021    8:24 AM  Serum Protein Electrophoresis  Total Protein 6.5 - 8.1 g/dL 7.6    Pregnancy status:  post-menopausaul  Assessment/Plan:  Patient was counseled on the purpose, proper use, and adverse effects of leflunomide including risk of infection, nausea/diarrhea/weight loss, increase in blood pressure, rash, hair loss, tingling in the hands and feet, and signs and symptoms of interstitial lung disease.   Also counseled on Box warning of liver injury and importance of avoiding alcohol while on therapy. Discussed that there is  the possibility of an increased risk of malignancy but it is not well understood if this increased risk is due to the medication or the disease state.  Counseled patient to avoid live vaccines.  Discussed the importance of frequent monitoring of liver function and blood count.  Standing orders placed.  Discussed importance of birth control while on leflunomide due to risk of congenital abnormalities, and patient confirms she is post-menopausal.  Provided patient with educational materials on leflunomide and answered all questions.  Patient consented to [place] use, and consent will be uploaded into the media tab.    Patient dose will be leflunomide 10mg  daily x 2 weeks, then repeat labs. If labs are stable, then increase to 20mg  daily. She will continue Remicade 5mg /kg every 5 weeks   , PharmD, MPH, BCPS, CPP Clinical Pharmacist (Rheumatology and Pulmonology)

## 2021-12-28 NOTE — Patient Instructions (Addendum)
Leflunomide Tablets What is this medication? LEFLUNOMIDE (le FLOO na mide) treats the symptoms of rheumatoid arthritis. It works by slowing down an overactive immune system. This decreases inflammation. It belongs to a group of medications called DMARDs. This medicine may be used for other purposes; ask your health care provider or pharmacist if you have questions. COMMON BRAND NAME(S): Arava What should I tell my care team before I take this medication? They need to know if you have any of these conditions: Cancer Diabetes High blood pressure Immune system problems Infection Kidney disease Liver disease Low blood cell levels (white cells, red cells, and platelets) Lung or breathing disease, such as asthma or COPD Recent or upcoming vaccine Skin conditions Tingling of the fingers or toes, or other nerve disorder An unusual or allergic reaction to leflunomide, other medications, food, dyes, or preservatives Pregnant or trying to get pregnant Breastfeeding How should I use this medication? Take this medication by mouth with a full glass of water. Take it as directed on the prescription label at the same time every day. Keep taking it unless your care team tells you to stop. Talk to your care team about the use of this medication in children. Special care may be needed. Overdosage: If you think you have taken too much of this medicine contact a poison control center or emergency room at once. NOTE: This medicine is only for you. Do not share this medicine with others. What if I miss a dose? If you miss a dose, take it as soon as you can. If it is almost time for your next dose, take only that dose. Do not take double or extra doses. What may interact with this medication? Do not take this medication with any of the following: Teriflunomide This medication may also interact with the following: Alosetron Caffeine Cefaclor Certain medications for diabetes, such as nateglinide,  repaglinide, rosiglitazone, pioglitazone Certain medications for high cholesterol, such as atorvastatin, pravastatin, rosuvastatin, simvastatin Charcoal Cholestyramine Ciprofloxacin Duloxetine Estrogen and progestin hormones Furosemide Ketoprofen Live virus vaccines Medications that increase your risk for infection Methotrexate Mitoxantrone Paclitaxel Penicillin Theophylline Tizanidine Warfarin This list may not describe all possible interactions. Give your health care provider a list of all the medicines, herbs, non-prescription drugs, or dietary supplements you use. Also tell them if you smoke, drink alcohol, or use illegal drugs. Some items may interact with your medicine. What should I watch for while using this medication? Visit your care team for regular checks on your progress. Tell your care team if your symptoms do not start to get better or if they get worse. You may need blood work done while you are taking this medication. This medication may cause serious skin reactions. They can happen weeks to months after starting the medication. Contact your care team right away if you notice fevers or flu-like symptoms with a rash. The rash may be red or purple and then turn into blisters or peeling of the skin. You may also notice a red rash with swelling of the face, lips, or lymph nodes in your neck or under your arms. You should not receive certain vaccines during your treatment and for a certain time after your treatment with this medication ends. Talk to your care team for more information. This medication may stay in your body for up to 2 years after your last dose. Tell your care team about any unusual side effects or symptoms. A medication can be given to help lower your blood levels of this   medication more quickly. Talk to your care team if you may be pregnant. This medication can cause serious birth defects if taken during pregnancy and for a while after the last dose. You will  need a negative pregnancy test before starting this medication. Contraception is recommended while taking this medication and for a while after the last dose. Your care team can help you find the option that works for you. Do not breastfeed while taking this medication. What side effects may I notice from receiving this medication? Side effects that you should report to your care team as soon as possible: Allergic reactions--skin rash, itching, hives, swelling of the face, lips, tongue, or throat Dry cough, shortness of breath or trouble breathing Increase in blood pressure Infection--fever, chills, cough, sore throat, wounds that don't heal, pain or trouble when passing urine, general feeling of discomfort or being unwell Redness, blistering, peeling, or loosening of the skin, including inside the mouth Liver injury--right upper belly pain, loss of appetite, nausea, light-colored stool, dark yellow or brown urine, yellowing skin or eyes, unusual weakness or fatigue Pain, tingling, or numbness in the hands or feet Unusual bruising or bleeding Side effects that usually do not require medical attention (report to your care team if they continue or are bothersome): Back pain Diarrhea Hair loss Headache Nausea This list may not describe all possible side effects. Call your doctor for medical advice about side effects. You may report side effects to FDA at 1-800-FDA-1088. Where should I keep my medication? Keep out of the reach of children and pets. Store at room temperature between 20 and 25 degrees C (68 and 77 degrees F). Protect from moisture and light. Keep the container tightly closed. Get rid of any unused medication after the expiration date. To get rid of medications that are no longer needed or have expired: Take the medication to a medication take-back program. Check with your pharmacy or law enforcement to find a location. If you cannot return the medication, ask your pharmacist or  care team how to get rid of this medication safely. NOTE: This sheet is a summary. It may not cover all possible information. If you have questions about this medicine, talk to your doctor, pharmacist, or health care provider.  2023 Elsevier/Gold Standard (2021-04-18 00:00:00) Standing Labs We placed an order today for your standing lab work.   Please have your standing labs drawn in 2 weeks x 2 and then every 2 months with Remicade infusions  (Following labs are due 2 weeks: CBC with differential, CMP with GFR, infliximab antibody titer, TB Gold )  If possible, please have your labs drawn 2 weeks prior to your appointment so that the provider can discuss your results at your appointment.  Please note that you may see your imaging and lab results in MyChart before we have reviewed them. We may be awaiting multiple results to interpret others before contacting you. Please allow our office up to 72 hours to thoroughly review all of the results before contacting the office for clarification of your results.  We currently have open lab daily: Monday through Thursday from 1:30 PM-4:30 PM and Friday from 1:30 PM- 4:00 PM If possible, please come for your lab work on Monday, Thursday or Friday afternoons, as you may experience shorter wait times.   Effective March 01, 2022 the new lab hours will change to: Monday through Thursday from 1:30 PM-5:00 PM and Friday from 8:30 AM-12:00 PM If possible, please come for your lab work on  Monday and Thursday afternoons, as you may experience shorter wait times.  Please be advised, all patients with office appointments requiring lab work will take precedent over walk-in lab work.    The office is located at [address], [address], [place], [name] [address] No appointment is necessary.   Labs are drawn by [facility]. Please bring your co-pay at the time of your lab draw.  You may receive a bill from [facility] for your lab work.  Please note if you are on  Hydroxychloroquine and and an order has been placed for a Hydroxychloroquine level, you will need to have it drawn 4 hours or more after your last dose.  If you wish to have your labs drawn at another location, please call the office 24 hours in advance to send orders.  If you have any questions regarding directions or hours of operation,  please call [phone].   As a reminder, please drink plenty of water prior to coming for your lab work. Thanks!   Vaccines You are taking a medication(s) that can suppress your immune system.  The following immunizations are recommended: Flu annually Covid-19  Td/Tdap (tetanus, diphtheria, pertussis) every 10 years Pneumonia (Prevnar 15 then Pneumovax 23 at least 1 year apart.  Alternatively, can take Prevnar 20 without needing additional dose) Shingrix: 2 doses from 4 weeks to 6 months apart  Please check with your PCP to make sure you are up to date.   If you have signs or symptoms of an infection or start antibiotics: First, call your PCP for workup of your infection. Hold your medication through the infection, until you complete your antibiotics, and until symptoms resolve if you take the following: Injectable medication (Actemra, Benlysta, Cimzia, Cosentyx, Enbrel, Humira, Kevzara, Orencia, Remicade, Simponi, Stelara, Taltz, Tremfya) Methotrexate Leflunomide (Arava) Mycophenolate (Cellcept) [place]borne [place], or Rinvoq  Iliotibial Band Syndrome Rehab Ask your health care provider which exercises are safe for you. Do exercises exactly as told by your health care provider and adjust them as directed. It is normal to feel mild stretching, pulling, tightness, or discomfort as you do these exercises. Stop right away if you feel sudden pain or your pain gets significantly worse. Do not begin these exercises until told by your health care provider. Stretching and range-of-motion exercises These exercises warm up your muscles and joints and improve the  movement and flexibility of your hip and pelvis. Quadriceps stretch, prone  Lie on your abdomen (prone position) on a firm surface, such as a bed or padded floor. Bend your left / right knee and reach back to hold your ankle or pant leg. If you cannot reach your ankle or pant leg, loop a belt around your foot and grab the belt instead. Gently pull your heel toward your buttocks. Your knee should not slide out to the side. You should feel a stretch in the front of your thigh and knee (quadriceps). Hold this position for __________ seconds. Repeat __________ times. Complete this exercise __________ times a day. Iliotibial band stretch An iliotibial band is a strong band of muscle tissue that runs from the outer side of your hip to the outer side of your thigh and knee. Lie on your side with your left / right leg in the top position. Bend both of your knees and grab your left / right ankle. Stretch out your bottom arm to help you balance. Slowly bring your top knee back so your thigh goes behind your trunk. Slowly lower your top leg toward  the floor until you feel a gentle stretch on the outside of your left / right hip and thigh. If you do not feel a stretch and your knee will not fall farther, place the heel of your other foot on top of your knee and pull your knee down toward the floor with your foot. Hold this position for __________ seconds. Repeat __________ times. Complete this exercise __________ times a day. Strengthening exercises These exercises build strength and endurance in your hip and pelvis. Endurance is the ability to use your muscles for a long time, even after they get tired. Straight leg raises, side-lying This exercise strengthens the muscles that rotate the leg at the hip and move it away from your body (hip abductors). Lie on your side with your left / right leg in the top position. Lie so your head, shoulder, hip, and knee line up. You may bend your bottom knee to help you  balance. Roll your hips slightly forward so your hips are stacked directly over each other and your left / right knee is facing forward. Tense the muscles in your outer thigh and lift your top leg 4-6 inches (10-15 cm). Hold this position for __________ seconds. Slowly lower your leg to return to the starting position. Let your muscles relax completely before doing another repetition. Repeat __________ times. Complete this exercise __________ times a day. Leg raises, prone This exercise strengthens the muscles that move the hips backward (hip extensors). Lie on your abdomen (prone position) on your bed or a firm surface. You can put a pillow under your hips if that is more comfortable for your lower back. Bend your left / right knee so your foot is straight up in the air. Squeeze your buttocks muscles and lift your left / right thigh off the bed. Do not let your back arch. Tense your thigh muscle as hard as you can without increasing any knee pain. Hold this position for __________ seconds. Slowly lower your leg to return to the starting position and allow it to relax completely. Repeat __________ times. Complete this exercise __________ times a day. Hip hike Stand sideways on a bottom step. Stand on your left / right leg with your other foot unsupported next to the step. You can hold on to a railing or wall for balance if needed. Keep your knees straight and your torso square. Then lift your left / right hip up toward the ceiling. Slowly let your left / right hip lower toward the floor, past the starting position. Your foot should get closer to the floor. Do not lean or bend your knees. Repeat __________ times. Complete this exercise __________ times a day. This information is not intended to replace advice given to you by your health care provider. Make sure you discuss any questions you have with your health care provider. Document Revised: 06/25/2019 Document Reviewed: 06/25/2019 Elsevier  Patient Education  2023 [employer].

## 2022-01-17 ENCOUNTER — Ambulatory Visit
Admission: RE | Admit: 2022-01-17 | Discharge: 2022-01-17 | Disposition: A | Discharge status: Home / Self Care | Payer: Medicare Other | Source: Ambulatory Visit | Attending: Internal Medicine | Admitting: Internal Medicine

## 2022-01-17 ENCOUNTER — Ambulatory Visit: Payer: Medicare Other

## 2022-01-17 DIAGNOSIS — Z1231 Encounter for screening mammogram for malignant neoplasm of breast: Secondary | ICD-10-CM

## 2022-01-18 ENCOUNTER — Encounter (HOSPITAL_COMMUNITY)
Admission: RE | Admit: 2022-01-18 | Discharge: 2022-01-18 | Disposition: A | Discharge status: Home / Self Care | Payer: Medicare Other | Source: Ambulatory Visit | Attending: Rheumatology | Admitting: Rheumatology

## 2022-01-18 DIAGNOSIS — M0579 Rheumatoid arthritis with rheumatoid factor of multiple sites without organ or systems involvement: Secondary | ICD-10-CM | POA: Diagnosis present

## 2022-01-18 DIAGNOSIS — Z111 Encounter for screening for respiratory tuberculosis: Secondary | ICD-10-CM

## 2022-01-18 DIAGNOSIS — Z79899 Other long term (current) drug therapy: Secondary | ICD-10-CM | POA: Diagnosis present

## 2022-01-18 MED ORDER — DIPHENHYDRAMINE HCL 25 MG PO CAPS: 25.0000 mg | ORAL_CAPSULE | ORAL | Status: DC

## 2022-01-18 MED ORDER — SODIUM CHLORIDE 0.9 % IV SOLN
5.0000 mg/kg | INTRAVENOUS | Status: DC
  Administered 2022-01-18: 300 mg via INTRAVENOUS
  Filled 2022-01-18: qty 30

## 2022-01-18 MED ORDER — ACETAMINOPHEN 325 MG PO TABS: 650.0000 mg | ORAL_TABLET | ORAL | Status: DC

## 2022-01-19 ENCOUNTER — Other Ambulatory Visit: Payer: Self-pay | Admitting: Internal Medicine

## 2022-01-19 DIAGNOSIS — R928 Other abnormal and inconclusive findings on diagnostic imaging of breast: Secondary | ICD-10-CM

## 2022-01-22 LAB — QUANTIFERON-TB GOLD PLUS (RQFGPL)
QuantiFERON Mitogen Value: >10 IU/mL
QuantiFERON Nil Value: 0.03 IU/mL
QuantiFERON TB1 Ag Value: 0.01 IU/mL
QuantiFERON TB2 Ag Value: 0.05 IU/mL

## 2022-01-22 LAB — QUANTIFERON-TB GOLD PLUS: QuantiFERON-TB Gold Plus: NEGATIVE

## 2022-01-23 NOTE — Progress Notes (Signed)
TB Gold is negative.

## 2022-01-24 ENCOUNTER — Encounter (HOSPITAL_COMMUNITY): Payer: Medicare Other

## 2022-02-09 NOTE — Progress Notes (Signed)
Office Visit Note  Patient: Wendy Woods             Date of Birth: Aug 21, 1960           MRN: [id number]             PCP: Nolene Ebbs, MD Referring: Nolene Ebbs, MD Visit Date: 02/10/2022 Occupation: @GUAROCC @  Subjective:  Left hand pain  History of Present Illness: Wendy Woods is a 61 y.o. female with history of seropositive rheumatoid arthritis, bilateral scleritis, and osteoarthritis.  Patient remains on Remicade 5 mg/kg IV infusions every 5 weeks.  Her last infusion was on 01/18/2022.  At her last office visit on 12/28/2021 arava was added as combination therapy. She is currently taking arava 10 mg 1 tablet by mouth daily.  She is having GI upset with Arava and has not noticed any clinical benefit yet.  She has been experiencing diarrhea, nausea, and has also noticed weight loss while taking Arava.  She previously had an intolerance to methotrexate due to GI side effects with both oral methotrexate and injectable methotrexate. Patient reports that she has been experiencing a flare involving multiple joints for the past 1 week.  She has been alternating Tylenol and Aleve as needed for pain relief.  She is unable to take prednisone due to uncontrolled type 2 diabetes.  Patient reports that this is the worst flare she remembers having with her rheumatoid arthritis.  She is having difficulty even using her left hand and wrist.  She is also having discomfort in her left shoulder and left knee joint.  She has some pain and swelling in the right index finger as well.  This flare has been affecting her quality of life since she has had difficulty performing ADLs as well as been experiencing nocturnal pain.  She would like to discuss treatment options today.   Activities of Daily Living:  Patient reports morning stiffness for 10 minutes.   Patient Reports nocturnal pain.  Difficulty dressing/grooming: Reports Difficulty climbing stairs: Denies Difficulty getting out of chair:  Denies Difficulty using hands for taps, buttons, cutlery, and/or writing: Reports  Review of Systems  Constitutional:  Positive for fatigue.  HENT:  Positive for mouth sores. Negative for mouth dryness.   Eyes:  Positive for dryness.  Respiratory:  Negative for shortness of breath.   Cardiovascular:  Negative for chest pain and palpitations.  Gastrointestinal:  Positive for diarrhea. Negative for blood in stool and constipation.  Endocrine: Negative for increased urination.  Genitourinary:  Negative for involuntary urination.  Musculoskeletal:  Positive for joint pain, gait problem, joint pain, joint swelling, myalgias, muscle weakness, morning stiffness, muscle tenderness and myalgias.  Skin:  Negative for color change, rash, hair loss and sensitivity to sunlight.  Allergic/Immunologic: Negative for susceptible to infections.  Neurological:  Positive for headaches. Negative for dizziness.  Hematological:  Negative for swollen glands.  Psychiatric/Behavioral:  Negative for depressed mood and sleep disturbance. The patient is not nervous/anxious.     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 07/03/2012    Past Medical History:  Diagnosis Date   Asthma    as a child   DDD (degenerative disc disease), cervical 03/22/2016   DDD (degenerative disc disease), lumbar 03/22/2016   GERD (gastroesophageal reflux disease)    Hyperlipidemia    Hypertension     Rheumatoid arthritis (HCC)    Rheumatoid arthritis(714.0)    Scleritis 03/22/2016   Seasonal allergies    Type II diabetes mellitus (HCC)     Family History  Problem Relation Age of Onset   Hypertension Mother    Anxiety disorder Mother    Colon polyps Father    Prostate cancer Father    Breast cancer Cousin 26   Colon cancer Neg Hx    Past Surgical History:  Procedure Laterality Date   APPENDECTOMY  1968   CATARACT EXTRACTION W/ INTRAOCULAR LENS  IMPLANT, BILATERAL Bilateral 11/2013-12/2013   KNEE ARTHROPLASTY     KNEE ARTHROSCOPY Left 1980's-1990's X 3   MYOMECTOMY  1990's   NASOPHARYNGEAL BIOPSY N/A 11/16/2021   Procedure: NASOPHARYNGEAL BIOPSY;  Surgeon: Christia Reading, MD;  Location: [facility] OR;  Service: ENT;  Laterality: N/A;   Social History   Social History Narrative   Right Handed   Lives in a two story home    Immunization History  Administered Date(s) Administered   PFIZER(Purple Top)SARS-COV-2 Vaccination 04/23/2019, 05/12/2019, 01/28/2020     Objective: Vital Signs: BP 134/79 (BP Location: Left Arm, Patient Position: Sitting, Cuff Size: Small)   Pulse 65   Resp 12   Ht  (1.626 m)   Wt 135 lb 6.4 oz (61.4 kg)   LMP 07/09/2014   BMI 23.24 kg/m    Physical Exam Vitals and nursing note reviewed.  Constitutional:      Appearance: She is well-developed.  HENT:     Head: Normocephalic and atraumatic.  Eyes:     Conjunctiva/sclera: Conjunctivae normal.  Cardiovascular:     Rate and Rhythm: Normal rate and regular rhythm.     Heart sounds: Normal heart sounds.  Pulmonary:     Effort: Pulmonary effort is normal.     Breath sounds: Normal breath sounds.  Abdominal:     General: Bowel sounds are normal.     Palpations: Abdomen is soft.  Musculoskeletal:     Cervical back: Normal range of motion.  Skin:    General: Skin is warm and dry.     Capillary Refill: Capillary refill takes less than 2 seconds.  Neurological:     Mental Status: She is alert and  oriented to person, place, and time.  Psychiatric:        Behavior: Behavior normal.      Musculoskeletal Exam: C-spine has painful and slightly limited with lateral rotation.  Midline spinal tenderness in the lumbar region.  Painful ROM of the left shoulder. Elbow joints have good ROM with no tenderness, swelling, or nodules.  Tenderness and synovitis of the left wrist.  Tenderness and synovitis of several MCP and PIP joints.  Incomplete fist formation.  Left 4th PIP has limited extension with tenderness and synovitis.  Hip joints have good ROM. Painful ROM of the left knee.  Ankle joints have good ROM with no tenderness or synovitis.  Tenderness of the left 5th MTP joint. Tailor bunion noted bilaterally.   CDAI Exam: CDAI Score: 15.6  Patient Global: 10 mm; Provider Global: 6 mm Swollen: 6 ; Tender:  9  Joint Exam 02/10/2022      Right  Left  Glenohumeral      Tender  Wrist   Tender  Swollen Tender  MCP 2  Swollen Tender     MCP 4     Swollen Tender  PIP 2  Swollen Tender  Swollen Tender  PIP 4     Swollen Tender  MTP 5      Tender     Investigation: No additional findings.  Imaging: XR Foot 2 Views Left  Result Date: 02/10/2022 Juxta-articular osteopenia was noted.  First MTP, PIP and DIP narrowing was noted.  No intertarsal, tibiotalar or subtalar joint space narrowing was noted.  No erosive changes were noted.  No radiographic progression was noted when compared to the x-rays of 2020. Impression: These findings are consistent with rheumatoid arthritis and osteoarthritis overlap.  XR Foot 2 Views Right  Result Date: 02/10/2022 Juxta-articular osteopenia was noted.  First MTP, PIP and DIP narrowing was noted.  No intertarsal, tibiotalar or subtalar joint space narrowing was noted.  No erosive changes were noted.  No radiographic progression was noted when compared to the x-rays of 2020. Impression: These findings are consistent with rheumatoid arthritis and osteoarthritis  overlap.  XR Hand 2 View Left  Result Date: 02/10/2022 CMC, PIP and DIP narrowing was noted.  Subluxation of second DIP joint was noted.  Juxta-articular osteopenia was noted.  No increased MCP narrowing was noted.  Severe intercarpal and radiocarpal joint space narrowing was noted.  No radiographic progression was noted when compared to the x-rays of 2020. Impression: These findings are consistent with rheumatoid arthritis and osteoarthritis overlap.  XR Hand 2 View Right  Result Date: 02/10/2022 CMC, PIP and DIP narrowing was noted.  Juxta-articular osteopenia was noted.  Second MCP narrowing was noted.  Severe intercarpal and radiocarpal joint space narrowing was noted.  No erosive changes were noted.  Increased intercarpal and radiocarpal joint space narrowing was noted when compared to the x-rays of 2020. Impression: These findings are consistent with rheumatoid arthritis and osteoarthritis overlap.  MM 3D SCREEN BREAST BILATERAL  Result Date: 01/18/2022 CLINICAL DATA:  Screening. EXAM: DIGITAL SCREENING BILATERAL MAMMOGRAM WITH TOMOSYNTHESIS AND CAD TECHNIQUE: Bilateral screening digital craniocaudal and mediolateral oblique mammograms were obtained. Bilateral screening digital breast tomosynthesis was performed. The images were evaluated with computer-aided detection. COMPARISON:  Previous exam(s). ACR Breast Density Category c: The breast tissue is heterogeneously dense, which may obscure small masses. FINDINGS: In the right axilla, a possible mass warrants further evaluation. In the left axilla, a possible mass warrants further evaluation. IMPRESSION: Further evaluation is suggested for possible mass in the right and left axillae. RECOMMENDATION: Ultrasound of the right and left axillae. The patient will be contacted regarding the findings, and additional imaging will be scheduled. BI-RADS CATEGORY  0: Incomplete. Need additional imaging evaluation and/or prior mammograms for comparison.  Electronically Signed   By: Frederico Hamman M.D.   On: 01/18/2022 12:18   Recent Labs: Lab Results  Component Value Date   WBC 7.3 12/20/2021   HGB 12.5 12/20/2021   PLT 380 12/20/2021   NA 133 (L) 12/20/2021   K 3.7 12/20/2021   CL 96 (L) 12/20/2021   CO2 26 12/20/2021   GLUCOSE 398 (H) 12/20/2021   BUN 13 12/20/2021   CREATININE 0.76 12/20/2021   BILITOT 0.4 12/20/2021   ALKPHOS 60 12/20/2021   AST 19 12/20/2021   ALT 16 12/20/2021   PROT 7.6 12/20/2021   ALBUMIN  3.5 12/20/2021   CALCIUM 9.5 12/20/2021   GFRAA >60 12/17/2019   QFTBGOLD Negative 09/18/2016   QFTBGOLDPLUS Negative 01/18/2022    Speciality Comments: Remicade 6mg / kg every 5 weeks-ACY 12/25/19 MTX -dcd 04/21 due to GI SEs  Procedures:  No procedures performed Allergies: Latex, Lisinopril, Penicillins, and Methotrexate derivatives     Assessment / Plan:     Visit Diagnoses: Rheumatoid arthritis involving multiple sites with positive rheumatoid factor (HCC) -  + RF, Anti-CCP +, ANA negative:  Patient presents today experiencing a flare involving multiple joints.  She has tenderness and synovitis of the left wrist as well as over several MCP and PIP joints.  She has been having significant difficulty performing ADLs as well as experiencing nocturnal pain for the past 1 week.  She is currently taking Arava 10 mg 1 tablet daily and remains on IV Remicade 5 mg/kg IV infusions every 5 weeks.  Her last Remicade infusion was administered on 01/18/2022.  She was started on a rave after her last office visit on 12/01/2021.  She has been experiencing nausea, diarrhea, and weight loss taking Areva.  She was advised to discontinue her Reyvow.  Different treatment options were discussed today in detail.  Due to the frequency and severity of flares as well as concern for staying on Remicade as monotherapy I recommended switching to an office administered Cimzia.  The patient is extremely apprehensive to proceed with at home  subcutaneous injections.  She previously was on Enbrel but had difficulty with the injections as well as an inadequate response. Patient is apprehensive to proceed with Jak inhibitors due to the FDA black box warning for increased risk for MACE.  Patient has history of uncontrolled type 2 diabetes as well as history of hypertension. Indications, contraindications, and potential side effects of Cimzia were discussed today in detail.  All questions were addressed and consent was obtained today. X-rays of both hands and feet were obtained today to assess for radiographic progression: X-rays of both hands and feet are consistent with osteoarthritis and rheumatoid arthritis overlap.  She has increased right radiocarpal and intercarpal joint space narrowing since 2020.  As discussed above she will benefit from switching from IV Remicade to in-office administered Cimzia. If she continues to have recurrent flares I plan to discuss adding on Plaquenil as combination therapy at her follow-up visit.  She will follow-up in the office in 6 to 8 weeks to assess her response. - Plan: XR Hand 2 View Left, XR Hand 2 View Right, XR Foot 2 Views Right, XR Foot 2 Views Left  Counseled patient that Cimzia is a TNF blocking agent.  Counseled patient on purpose, proper use, and adverse effects of Cimzia.  Reviewed the most common adverse effects including infections, headache, and injection site reactions. Discussed that there is the possibility of an increased risk of malignancy including non-melanoma skin cancer but it is not well understood if this increased risk is due to the medication or the disease state.  Advised patient to get yearly dermatology exams due to risk of skin cancer.  Counseled patient that Cimzia should be held prior to scheduled surgery.  Counseled patient to avoid live vaccines while on Cimzia.  Recommend annual influenza, PCV 15 or PCV20 or Pneumovax 23, and Shingrix as indicated.   Reviewed the  importance of regular labs while on Cimzia therapy. Will monitor CBC and CMP 1 month after starting and then every 3 months routinely thereafter. Will monitor TB gold annually. Standing orders  placed. Provided patient with medication education material and answered all questions.  Patient voiced understanding.  Patient consented to Cimzia.  Will upload consent into the media tab.  Reviewed storage instructions for Cimzia.  Will apply for Cimzia through patient's insurance.  Advised initial injection must be administered in office.    Dose will be Cimzia 400 mg at weeks 0,2,4 then Cimzia 400 mg every 28 days  Prescription pending labs and insurance approval.  High risk medication use -Planning to apply for in-office administered Cimzia injections.  Okay to initiate Cimzia on or after 02/22/2022. Patient is extremely apprehensive to proceed with at home subcutaneous injections.  She previously had significant difficulty with at home Enbrel injections. CBC and CMP drawn on 12/20/2021.  CBC and CMP will be updated today.  Her next lab work will be due 1 month after initiating Cimzia and then every 3 months to monitor for drug toxicity. TB Gold negative on 01/18/2022. She has not had any recent or recurrent infections.  Discussed the importance of holding Cimzia if she develops signs or symptoms of an infection and to resume once the infection has completely cleared.   - Plan: COMPLETE METABOLIC PANEL WITH GFR, CBC with Differential/Platelet, CANCELED: Infliximab (IFX) Conc+ IFX Ab Previous therapy includes: Enbrel, oral and injectable methotrexate, Arava, and IV Remicade Methotrexate discontinued due to GI side effects Arava discontinued due to GI side effects and weight loss Inadequate response to Enbrel. Inadequate response to IV Remicade.  Bilateral scleritis - Followed by Dr. Honor Loh.  No recent flares.   Primary osteoarthritis of both hands: She has PIP and DIP thickening consistent with  osteoarthritis of both hands.  X-rays of both hands were obtained today which were consistent with rheumatoid arthritis and osteoarthritis overlap.  She had some increased narrowing of the right radiocarpal and intercarpal joints but no erosive changes were noted.  Otherwise no progression was noted since 2020.  Trochanteric bursitis, right hip: She has some tenderness ovation over the right trochanteric bursa on examination today.  Primary osteoarthritis of both knees: Painful range of motion of the left knee joint.  No warmth or effusion was noted.  DDD (degenerative disc disease), cervical: C-spine has limited range of motion especially with lateral rotation.  No symptoms of radiculopathy.  DDD (degenerative disc disease), lumbar: Patient experiences intermittent discomfort in her lower back.  She has mild midline spinal tenderness on examination today.  No symptoms of radiculopathy.  Osteopenia of multiple sites - DEXA updated on 06/06/21: The BMD measured at Femur Neck Left is 0.843 g/cm2 with a T-score of -1.4.  The use of calcium vitamin D is recommended.  History of vitamin D deficiency  History of diabetes mellitus, type II: She is not a good candidate for cortisone or prednisone tapers due to uncontrolled type 2 diabetes.  Essential hypertension: Blood pressure was 134/79 today in the office.  Orders: Orders Placed This Encounter  Procedures   XR Hand 2 View Left   XR Hand 2 View Right   XR Foot 2 Views Right   XR Foot 2 Views Left   COMPLETE METABOLIC PANEL WITH GFR   CBC with Differential/Platelet   No orders of the defined types were placed in this encounter.    Follow-Up Instructions: Return in 8 weeks (on 04/07/2022) for Rheumatoid arthritis, Osteoarthritis.   Gearldine Bienenstock, PA-C  Note - This record has been created using Dragon software.  Chart creation errors have been sought, but may not always  have  been located. Such creation errors do not reflect on  the  standard of medical care.

## 2022-02-10 ENCOUNTER — Ambulatory Visit (INDEPENDENT_AMBULATORY_CARE_PROVIDER_SITE_OTHER): Payer: Medicare Other

## 2022-02-10 ENCOUNTER — Telehealth: Payer: Self-pay | Admitting: Pharmacy Technician

## 2022-02-10 ENCOUNTER — Encounter: Payer: Self-pay | Admitting: Physician Assistant

## 2022-02-10 ENCOUNTER — Ambulatory Visit: Payer: Medicare Other | Attending: Physician Assistant | Admitting: Physician Assistant

## 2022-02-10 VITALS — BP 134/79 | HR 65 | Resp 12 | Ht 64.0 in | Wt 135.4 lb

## 2022-02-10 DIAGNOSIS — M79671 Pain in right foot: Secondary | ICD-10-CM | POA: Diagnosis not present

## 2022-02-10 DIAGNOSIS — M79642 Pain in left hand: Secondary | ICD-10-CM

## 2022-02-10 DIAGNOSIS — M79672 Pain in left foot: Secondary | ICD-10-CM | POA: Diagnosis not present

## 2022-02-10 DIAGNOSIS — M0579 Rheumatoid arthritis with rheumatoid factor of multiple sites without organ or systems involvement: Secondary | ICD-10-CM | POA: Diagnosis not present

## 2022-02-10 DIAGNOSIS — M51369 Other intervertebral disc degeneration, lumbar region without mention of lumbar back pain or lower extremity pain: Secondary | ICD-10-CM

## 2022-02-10 DIAGNOSIS — Z79899 Other long term (current) drug therapy: Secondary | ICD-10-CM | POA: Diagnosis not present

## 2022-02-10 DIAGNOSIS — M19041 Primary osteoarthritis, right hand: Secondary | ICD-10-CM | POA: Diagnosis not present

## 2022-02-10 DIAGNOSIS — H15003 Unspecified scleritis, bilateral: Secondary | ICD-10-CM

## 2022-02-10 DIAGNOSIS — M8589 Other specified disorders of bone density and structure, multiple sites: Secondary | ICD-10-CM

## 2022-02-10 DIAGNOSIS — I10 Essential (primary) hypertension: Secondary | ICD-10-CM

## 2022-02-10 DIAGNOSIS — Z8639 Personal history of other endocrine, nutritional and metabolic disease: Secondary | ICD-10-CM

## 2022-02-10 DIAGNOSIS — M79641 Pain in right hand: Secondary | ICD-10-CM

## 2022-02-10 DIAGNOSIS — M19042 Primary osteoarthritis, left hand: Secondary | ICD-10-CM

## 2022-02-10 DIAGNOSIS — M5030 Other cervical disc degeneration, unspecified cervical region: Secondary | ICD-10-CM

## 2022-02-10 DIAGNOSIS — M5136 Other intervertebral disc degeneration, lumbar region: Secondary | ICD-10-CM

## 2022-02-10 DIAGNOSIS — M7061 Trochanteric bursitis, right hip: Secondary | ICD-10-CM

## 2022-02-10 DIAGNOSIS — M170 Bilateral primary osteoarthritis of knee: Secondary | ICD-10-CM

## 2022-02-10 NOTE — Telephone Encounter (Signed)
Just completed my office visit note from today.   Patient previously had significant difficulty with weekly at-home Enbrel injections.  She does not want to proceed with an at-home subcutaneous injection due to needle phobia. Patient is apprehensive to try Jak inhibitors due to the black box warning for MACE.  She has uncontrolled type 2 diabetes and history of hypertension.  In-office administered Cimzia will be the best option for compliance and safety at this time.

## 2022-02-10 NOTE — Progress Notes (Signed)
X-rays of both hands and feet are consistent with rheumatoid arthritis and osteoarthritis overlap.  She has increased right intercarpal and radiocarpal joint space narrowing when compared to 2020.  No other radiographic progression was noted.  Please notify the patient.

## 2022-02-10 NOTE — Patient Instructions (Addendum)
Certolizumab Pegol Injection What is this medication? CERTOLIZUMAB (SER toe LIZ oo mab) treats autoimmune conditions, such as psoriasis, arthritis, and Crohn's disease. It works by slowing down an overactive immune system. It belongs to a group of medications called TNF inhibitors. It is a monoclonal antibody. This medicine may be used for other purposes; ask your health care provider or pharmacist if you have questions. COMMON BRAND NAME(S): Cimzia What should I tell my care team before I take this medication? They need to know if you have any of these conditions: Cancer Diabetes Guillain-Barre syndrome Heart failure Hepatitis B or history of hepatitis B infection Immune system problems Infection or history of infections Low blood counts, such as low white cell, platelet, or red cell counts Multiple sclerosis Recently received or scheduled to receive a vaccine Tuberculosis, a positive skin test for tuberculosis, or recent close contact with someone who has tuberculosis An unusual or allergic reaction to certolizumab, other medications, latex, rubber, foods, dyes, or preservatives Pregnant or trying to get pregnant Breast-feeding How should I use this medication? This medication is injected under the skin. It is usually given by your care team in a hospital or clinic setting. It may also be given at home. If you get this medication at home, you will be taught how to prepare and give it. Use exactly as directed. Take it as directed on the prescription label at the same time every day. Keep taking it unless your care team tells you to stop. It is important that you put your used needles and syringes in a special sharps container. Do not put them in a trash can. If you do not have a sharps container, call your pharmacist or care team to get one. A special MedGuide will be given to you by the pharmacist with each prescription and refill. Be sure to read this information carefully each  time. Talk to your care team about the use of this medication in children. Special care may be needed. Overdosage: If you think you have taken too much of this medicine contact a poison control center or emergency room at once. NOTE: This medicine is only for you. Do not share this medicine with others. What if I miss a dose? If you get this medication at a hospital or clinic: It is important not to miss your dose. Call your care team if you are unable to keep an appointment. If you give yourself this medication at home: If you miss a dose, take it as soon as you can. If it is almost time for your next dose, take only that dose. Do not take double or extra doses. Call your care team with questions. What may interact with this medication? Do not take this medication with any of the following: Biologic medications, such as abatacept, adalimumab, anakinra, etanercept, golimumab, infliximab, natalizumab, rituximab, secukinumab, tocilizumab, ustekinumab Live vaccines Tofacitinib This list may not describe all possible interactions. Give your health care provider a list of all the medicines, herbs, non-prescription drugs, or dietary supplements you use. Also tell them if you smoke, drink alcohol, or use illegal drugs. Some items may interact with your medicine. What should I watch for while using this medication? Visit your care team for regular checks on your progress. Tell your care team if your symptoms do not start to get better or if they get worse. Your condition will be monitored carefully while you are receiving this medication. You will be tested for tuberculosis (TB) before you start this medication.  If your care team prescribes any medication for TB, you should start taking the TB medication before starting this medication. Make sure to finish the full course of TB medication. This medication may increase your risk of getting an infection. Call your care team for advice if you get a fever,  chills, sore throat, or other symptoms of a cold or flu. Do not treat yourself. Try to avoid being around people who are sick. Talk to your care team about your risk of cancer. You may be more at risk for certain types of cancers if you take this medication. What side effects may I notice from receiving this medication? Side effects that you should report to your care team as soon as possible: Allergic reactions--skin rash, itching, hives, swelling of the face, lips, tongue, or throat Aplastic anemia--unusual weakness or fatigue, dizziness, headache, trouble breathing, increased bleeding or bruising Body pain, tingling, or numbness Heart failure--shortness of breath, swelling of the ankles, feet, or hands, sudden weight gain, unusual weakness or fatigue Infection--fever, chills, cough, sore throat, wounds that don't heal, pain or trouble when passing urine, general feeling of discomfort or being unwell Lupus-like syndrome--joint pain, swelling, or stiffness, butterfly-shaped rash on the face, rashes that get worse in the sun, fever, unusual weakness or fatigue Seizures Unusual bruising or bleeding Side effects that usually do not require medical attention (report to your care team if they continue or are bothersome): Back pain Cough Fatigue Fever Headache Pain, redness, or irritation at injection site Sore throat This list may not describe all possible side effects. Call your doctor for medical advice about side effects. You may report side effects to FDA at 1-800-FDA-1088. Where should I keep my medication? Keep out of the reach of children and pets. Store in the refrigerator. Do not freeze. Keep this medication in the original packaging until you are ready to take it. Protect from light. Get rid of any unused medication after the expiration date. To get rid of medications that are no longer needed or have expired: Take the medication to a medication take-back program. Check with your  pharmacy or law enforcement to find a location. If you cannot return the medication, ask your pharmacist or care team how to get rid of this medication safely. NOTE: This sheet is a summary. It may not cover all possible information. If you have questions about this medicine, talk to your doctor, pharmacist, or health care provider.  2023 Elsevier/Gold Standard (2021-06-30 00:00:00)   If you have signs or symptoms of an infection or start antibiotics: First, call your PCP for workup of your infection. Hold your medication through the infection, until you complete your antibiotics, and until symptoms resolve if you take the following: Injectable medication (Actemra, Benlysta, Cimzia, Cosentyx, Enbrel, Humira, Kevzara, Orencia, Remicade, Simponi, Stelara, Taltz, Tremfya) Methotrexate Leflunomide (Arava) Mycophenolate (Cellcept) Morrie Sheldon, Olumiant, or Rinvoq   Vaccines You are taking a medication(s) that can suppress your immune system.  The following immunizations are recommended: Flu annually Covid-19  Td/Tdap (tetanus, diphtheria, pertussis) every 10 years Pneumonia (Prevnar 15 then Pneumovax 23 at least 1 year apart.  Alternatively, can take Prevnar 20 without needing additional dose) Shingrix: 2 doses from 4 weeks to 6 months apart  Please check with your PCP to make sure you are up to date.

## 2022-02-10 NOTE — Telephone Encounter (Signed)
Initiated a Prior Authorization request to [employer] for CIMZIA Syringe Starter Kit via CoverMyMeds.   Key: [id number] - PA Case ID: [id number]  Plan Criteria- Does the patient have a trial and failure, contraindication, or intolerance to TWO of the following: [1] Enbrel (etanercept), [2] Humira (adalimumab), [3] Rinvoq (upadacitinib), [4] Xeljanz/Xeljanz XR (tofacitinib)?  I see patient has tried Enbrel, does she have any contraindications to any of the other preferred options?  Thanks!  

## 2022-02-10 NOTE — Telephone Encounter (Signed)
-----   Message from Shona Needles, RT sent at 02/10/2022  9:57 AM EDT ----- Regarding: NEW START CIMZIA Patient starting Cimzia. Thank you.

## 2022-02-11 LAB — CBC WITH DIFFERENTIAL/PLATELET
Absolute Monocytes: 500 cells/uL (ref 200–950)
Basophils Absolute: 49 cells/uL (ref 0–200)
Basophils Relative: 0.6 %
Eosinophils Absolute: 172 cells/uL (ref 15–500)
Eosinophils Relative: 2.1 %
HCT: 39.9 % (ref 35.0–45.0)
Hemoglobin: 13.1 g/dL (ref 11.7–15.5)
Lymphs Abs: 2485 cells/uL (ref 850–3900)
MCH: 29.9 pg (ref 27.0–33.0)
MCHC: 32.8 g/dL (ref 32.0–36.0)
MCV: 91.1 fL (ref 80.0–100.0)
MPV: 9.7 fL (ref 7.5–12.5)
Monocytes Relative: 6.1 %
Neutro Abs: 4994 cells/uL (ref 1500–7800)
Neutrophils Relative %: 60.9 %
Platelets: 393 10*3/uL (ref 140–400)
RBC: 4.38 10*6/uL (ref 3.80–5.10)
RDW: 12 % (ref 11.0–15.0)
Total Lymphocyte: 30.3 %
WBC: 8.2 10*3/uL (ref 3.8–10.8)

## 2022-02-11 LAB — COMPLETE METABOLIC PANEL WITH GFR
AG Ratio: 1 (calc) (ref 1.0–2.5)
ALT: 10 U/L (ref 6–29)
AST: 11 U/L (ref 10–35)
Albumin: 3.9 g/dL (ref 3.6–5.1)
Alkaline phosphatase (APISO): 67 U/L (ref 37–153)
BUN: 13 mg/dL (ref 7–25)
CO2: 33 mmol/L — ABNORMAL HIGH (ref 20–32)
Calcium: 10.1 mg/dL (ref 8.6–10.4)
Chloride: 95 mmol/L — ABNORMAL LOW (ref 98–110)
Creat: 0.78 mg/dL (ref 0.50–1.05)
Globulin: 3.8 g/dL (calc) — ABNORMAL HIGH (ref 1.9–3.7)
Glucose, Bld: 347 mg/dL — ABNORMAL HIGH (ref 65–99)
Potassium: 4 mmol/L (ref 3.5–5.3)
Sodium: 136 mmol/L (ref 135–146)
Total Bilirubin: 0.4 mg/dL (ref 0.2–1.2)
Total Protein: 7.7 g/dL (ref 6.1–8.1)
eGFR: 87 mL/min/{1.73_m2} (ref >=60)

## 2022-02-13 NOTE — Progress Notes (Signed)
CBC WNL.  Glucose is very elevated-347.  CMP stable. Please advise the patient to schedule a follow up with her PCP to discuss DM management.

## 2022-02-15 NOTE — Telephone Encounter (Signed)
Started case in [place] portal for in-office Cimzia injections.  Patient Hub ID # [id number] Case # [id number]   Knox Saliva, PharmD, MPH, BCPS, CPP Clinical Pharmacist (Rheumatology and Pulmonology)

## 2022-02-17 NOTE — Telephone Encounter (Signed)
Dose will be Cimzia 400 mg at weeks 0,2,4 then Cimzia 400 mg every 28 days    Called and spoke with patient and advised she can be scheduled for Cimzia new start on or after 02/22/22. She should also call and cancel her Remicade infusion with [facility]. Patient states she will call to cancel infusion. Patient scheduled for her loading doses of Cimzia on 02/27/2022, 03/13/2022 and 03/27/2022. Patient advised to contact the office if she develops an infection, schedules any procedure or has to be placed on antibiotics so we can reschedule her appointment.

## 2022-02-17 NOTE — Telephone Encounter (Addendum)
Per [facility] verification of benefits: Clinic is in-network with patient's Medicare Advantage plan.  No prior authorization is required (as confirmed by two insurance reps). They recommend submitting authorization under medical benefits for buy and bill since a pharmacy PA is not valid for buy and bill benefits.  Effective 01/29/22, UHC requires all providers to use the JZ modifier for all Medicare Advantage buy and bill claims that bill for single-dose container drugs with no wastage, including Cimzia (Y8657). It is recommended to verified the patient's continued enrollment in the [facility] plan on a monthly basis since she is enrolled in a dual Medicare/Medicaid plan.  [facility] case manager Kristeen Mans) can be contacted at [phone], option 2, option 1, ext [phone]  Submitted authorization on [facility] portal as recommended by [facility] nurse. No PA required but PA submitted nevertheless. Medicare will pick up 80% of cost of cost and patient has 20% coinsurance. However she has Medicaid plan as well so Medicaid will pick up remaining 20% that she would be responsible for. Patient can be scheduled for Cimzia new start on or after 02/22/22. She should also call and cancel her Remicade infusion with [facility] [phone])  Knox Saliva, PharmD, MPH, BCPS, CPP Clinical Pharmacist (Rheumatology and Pulmonology)

## 2022-02-20 ENCOUNTER — Ambulatory Visit
Admission: RE | Admit: 2022-02-20 | Discharge: 2022-02-20 | Disposition: A | Discharge status: Home / Self Care | Payer: Medicare Other | Source: Ambulatory Visit | Attending: Internal Medicine | Admitting: Internal Medicine

## 2022-02-20 ENCOUNTER — Other Ambulatory Visit: Payer: Medicare Other

## 2022-02-20 DIAGNOSIS — R928 Other abnormal and inconclusive findings on diagnostic imaging of breast: Secondary | ICD-10-CM

## 2022-02-22 ENCOUNTER — Encounter (HOSPITAL_COMMUNITY): Payer: Medicare Other

## 2022-02-27 ENCOUNTER — Ambulatory Visit: Payer: Medicare Other | Admitting: Physician Assistant

## 2022-02-27 ENCOUNTER — Ambulatory Visit: Payer: Medicare Other | Attending: Rheumatology | Admitting: *Deleted

## 2022-02-27 VITALS — BP 142/77 | HR 70

## 2022-02-27 DIAGNOSIS — M0579 Rheumatoid arthritis with rheumatoid factor of multiple sites without organ or systems involvement: Secondary | ICD-10-CM | POA: Diagnosis not present

## 2022-02-27 MED ORDER — CERTOLIZUMAB PEGOL 2 X 200 MG ~~LOC~~ KIT
400.0000 mg | PACK | Freq: Once | SUBCUTANEOUS | Status: AC
  Administered 2022-02-27: 400 mg via SUBCUTANEOUS

## 2022-02-27 NOTE — Progress Notes (Signed)
Subjective:   Patient presents to clinic today to receive monthly dose of Cimzia.  Patient running a fever or have signs/symptoms of infection? No  Patient currently on antibiotics for the treatment of infection? No  Patient have any upcoming invasive procedures/surgeries? No  Objective: CMP     Component Value Date/Time   NA 136 02/10/2022 1022   K 4.0 02/10/2022 1022   CL 95 (L) 02/10/2022 1022   CO2 33 (H) 02/10/2022 1022   GLUCOSE 347 (H) 02/10/2022 1022   BUN 13 02/10/2022 1022   CREATININE 0.78 02/10/2022 1022   CALCIUM 10.1 02/10/2022 1022   PROT 7.7 02/10/2022 1022   ALBUMIN 3.5 12/20/2021 0824   AST 11 02/10/2022 1022   ALT 10 02/10/2022 1022   ALKPHOS 60 12/20/2021 0824   BILITOT 0.4 02/10/2022 1022   GFRNONAA >60 12/20/2021 0824   GFRAA >60 12/17/2019 0913    CBC    Component Value Date/Time   WBC 8.2 02/10/2022 1022   RBC 4.38 02/10/2022 1022   HGB 13.1 02/10/2022 1022   HCT 39.9 02/10/2022 1022   PLT 393 02/10/2022 1022   MCV 91.1 02/10/2022 1022   MCH 29.9 02/10/2022 1022   MCHC 32.8 02/10/2022 1022   RDW 12.0 02/10/2022 1022   LYMPHSABS 2,485 02/10/2022 1022   MONOABS 0.5 12/20/2021 0824   EOSABS 172 02/10/2022 1022   BASOSABS 49 02/10/2022 1022    Baseline Immunosuppressant Therapy Labs TB GOLD    Latest Ref Rng & Units 01/18/2022    8:41 AM  Quantiferon TB Gold  Quantiferon TB Gold Plus Negative Negative    Hepatitis Panel   HIV No results found for: "HIV" Immunoglobulins   SPEP    Latest Ref Rng & Units 02/10/2022   10:22 AM  Serum Protein Electrophoresis  Total Protein 6.1 - 8.1 g/dL 7.7    G6PD No results found for: "G6PDH" TPMT No results found for: "TPMT"   Chest x-ray: 03/03/2011 No acute cardiopulmonary disease.  Scoliosis.  Assessment/Plan:   Administrations This Visit     certolizumab pegol (CIMZIA) kit 400 mg     Admin Date 02/27/2022 Action Given Dose 400 mg Route Subcutaneous Administered By Carole Binning, LPN             Patient in office to initiate Cimzia. Patient monitored in office for 30 minutes after administration for adverse reactions. No adverse reactions noted. Patient tolerated injection well.   Appointment for next injection scheduled for 03/13/2022.  Patient due for labs in November 2023.  Patient is to call and reschedule appointment if running a fever with signs/symptoms of infection, on antibiotics for active infection or has an upcoming invasive procedure.  All questions encouraged and answered.  Instructed patient to call with any further questions or concerns.

## 2022-03-03 ENCOUNTER — Telehealth: Payer: Self-pay | Admitting: *Deleted

## 2022-03-03 NOTE — Telephone Encounter (Signed)
Attempted to contact the patient and left message for patient to call the office.  

## 2022-03-03 NOTE — Telephone Encounter (Signed)
Patient is not a good candidate for prednisone.  Her blood sugars have been significantly high >300.  Patient can take Tylenol or NSAIDs as needed for symptomatic relief.

## 2022-03-03 NOTE — Telephone Encounter (Signed)
Patient contacted the office and left message requesting a prescription for Prednisone. Patient was having increased discomfort when she came into the office for her Cimzia injection. Please advise.

## 2022-03-06 NOTE — Telephone Encounter (Signed)
Patient should contact her PCP or endocrinologist for better control of blood glucose. Once her blood glucose level is normal then we can give her prednisone or inject her wrist.

## 2022-03-06 NOTE — Telephone Encounter (Signed)
Please advise.  I do not feel comfortable prescribing prednisone due to uncontrolled diabetes (blood sugar was 347 on 02/10/22).

## 2022-03-06 NOTE — Telephone Encounter (Signed)
Patient advised she is not a good candidate for prednisone.  Her blood sugars have been significantly high >300.  Patient advised she can take Tylenol or NSAIDs as needed for symptomatic relief.  Patient states her left wrist in what's hurting and Aleve and tylenol are not helping. She states her other joints are not bothering her. She would like to know what else may be able to be done. Please advise.

## 2022-03-06 NOTE — Telephone Encounter (Signed)
Attempted to contact the patient and left message for patient to call the office.  

## 2022-03-07 NOTE — Telephone Encounter (Signed)
Patient advised she should contact her PCP or endocrinologist for better control of blood glucose. Once her blood glucose level is normal then we can give her prednisone or inject her wrist. Patient expressed understanding.

## 2022-03-08 ENCOUNTER — Telehealth: Payer: Self-pay | Admitting: *Deleted

## 2022-03-08 DIAGNOSIS — F419 Anxiety disorder, unspecified: Secondary | ICD-10-CM

## 2022-03-08 DIAGNOSIS — F439 Reaction to severe stress, unspecified: Secondary | ICD-10-CM

## 2022-03-08 NOTE — Telephone Encounter (Signed)
Referral placed.
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
## 2022-03-08 NOTE — Telephone Encounter (Signed)
Ok to place urgent referral to [facility].

## 2022-03-08 NOTE — Addendum Note (Signed)
Addended by: Henriette Combs on: 03/08/2022 03:34 PM   Modules accepted: Orders

## 2022-03-13 ENCOUNTER — Ambulatory Visit: Payer: Medicare Other | Attending: Internal Medicine

## 2022-03-14 ENCOUNTER — Ambulatory Visit: Payer: Medicare Other | Attending: Rheumatology | Admitting: *Deleted

## 2022-03-14 VITALS — BP 156/83 | HR 64

## 2022-03-14 DIAGNOSIS — M0579 Rheumatoid arthritis with rheumatoid factor of multiple sites without organ or systems involvement: Secondary | ICD-10-CM | POA: Diagnosis not present

## 2022-03-14 MED ORDER — CERTOLIZUMAB PEGOL 2 X 200 MG ~~LOC~~ KIT
400.0000 mg | PACK | Freq: Once | SUBCUTANEOUS | Status: AC
  Administered 2022-03-14: 400 mg via SUBCUTANEOUS

## 2022-03-14 NOTE — Progress Notes (Signed)
Subjective:   Patient presents to clinic today to receive monthly dose of Cimzia.  Patient running a fever or have signs/symptoms of infection? No  Patient currently on antibiotics for the treatment of infection? No  Patient have any upcoming invasive procedures/surgeries? No  Objective: CMP     Component Value Date/Time   NA 136 02/10/2022 1022   K 4.0 02/10/2022 1022   CL 95 (L) 02/10/2022 1022   CO2 33 (H) 02/10/2022 1022   GLUCOSE 347 (H) 02/10/2022 1022   BUN 13 02/10/2022 1022   CREATININE 0.78 02/10/2022 1022   CALCIUM 10.1 02/10/2022 1022   PROT 7.7 02/10/2022 1022   ALBUMIN 3.5 12/20/2021 0824   AST 11 02/10/2022 1022   ALT 10 02/10/2022 1022   ALKPHOS 60 12/20/2021 0824   BILITOT 0.4 02/10/2022 1022   GFRNONAA >60 12/20/2021 0824   GFRAA >60 12/17/2019 0913    CBC    Component Value Date/Time   WBC 8.2 02/10/2022 1022   RBC 4.38 02/10/2022 1022   HGB 13.1 02/10/2022 1022   HCT 39.9 02/10/2022 1022   PLT 393 02/10/2022 1022   MCV 91.1 02/10/2022 1022   MCH 29.9 02/10/2022 1022   MCHC 32.8 02/10/2022 1022   RDW 12.0 02/10/2022 1022   LYMPHSABS 2,485 02/10/2022 1022   MONOABS 0.5 12/20/2021 0824   EOSABS 172 02/10/2022 1022   BASOSABS 49 02/10/2022 1022    Baseline Immunosuppressant Therapy Labs TB GOLD    Latest Ref Rng & Units 01/18/2022    8:41 AM  Quantiferon TB Gold  Quantiferon TB Gold Plus Negative Negative    Hepatitis Panel   HIV No results found for: "HIV" Immunoglobulins   SPEP    Latest Ref Rng & Units 02/10/2022   10:22 AM  Serum Protein Electrophoresis  Total Protein 6.1 - 8.1 g/dL 7.7    G6PD No results found for: "G6PDH" TPMT No results found for: "TPMT"   Chest x-ray: 05/02/2010 No acute cardiopulmonary disease.  Scoliosis.   Assessment/Plan:   Administrations This Visit     certolizumab pegol (CIMZIA) kit 400 mg     Admin Date 03/14/2022 Action Given Dose 400 mg Route Subcutaneous Administered By Carole Binning, LPN             Patient tolerated injection well.   Appointment for next injection scheduled for 03/28/2022.  Patient due for labs at next appointment 03/28/2022.  Patient is to call and reschedule appointment if running a fever with signs/symptoms of infection, on antibiotics for active infection or has an upcoming invasive procedure.  All questions encouraged and answered.  Instructed patient to call with any further questions or concerns.

## 2022-03-20 NOTE — Progress Notes (Signed)
Office Visit Note  Patient: Wendy Woods             Date of Birth: Mar 31, 1961           MRN: [id number]             PCP: Fleet Contras, MD Referring: Fleet Contras, MD Visit Date: 04/03/2022 Occupation: @GUAROCC @  Subjective:  Pain in multiple joints   History of Present Illness: Wendy Woods is a 61 y.o. female with history of seropositive arthritis, bilateral scleritis, and osteoarthritis.  Patient is currently on in office administered Cimzia injections every month.  She had her third injection administered on 03/28/2022 and she is scheduled for her next injection on 04/27/2022.  She has been tolerating Cimzia without any side effects or injection site reactions.  She has noticed about a 35% improvement in her symptoms since switching from IV Remicade to Cimzia.  She continues to have some persistent pain and stiffness in both wrists and both knee joints.  She has been taking Tylenol and aleve as needed for pain relief.  She denies any recent or recurrent infections.     Activities of Daily Living:  Patient reports morning stiffness for 10 minutes  Patient Reports nocturnal pain.  Difficulty dressing/grooming: Denies Difficulty climbing stairs: Denies Difficulty getting out of chair: Denies Difficulty using hands for taps, buttons, cutlery, and/or writing: Reports  Review of Systems  Constitutional:  Positive for fatigue.  HENT:  Negative for mouth sores, mouth dryness and nose dryness.   Eyes:  Positive for dryness. Negative for pain and visual disturbance.  Respiratory:  Negative for cough, hemoptysis, shortness of breath and difficulty breathing.   Cardiovascular:  Negative for chest pain, palpitations, hypertension and swelling in legs/feet.  Gastrointestinal:  Negative for blood in stool, constipation and diarrhea.  Endocrine: Negative for increased urination.  Genitourinary:  Negative for painful urination.  Musculoskeletal:  Positive for joint pain, joint pain,  joint swelling and morning stiffness. Negative for myalgias, muscle weakness, muscle tenderness and myalgias.  Skin:  Negative for color change, pallor, rash, hair loss, nodules/bumps, skin tightness, ulcers and sensitivity to sunlight.  Neurological:  Positive for headaches. Negative for dizziness, numbness and weakness.  Hematological:  Negative for swollen glands.  Psychiatric/Behavioral:  Positive for depressed mood and sleep disturbance. The patient is not nervous/anxious.     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    Past Medical History:  Diagnosis Date   Asthma    as a child   DDD (degenerative disc disease), cervical 03/22/2016   DDD (degenerative disc disease), lumbar 03/22/2016   GERD (gastroesophageal reflux disease)    Hyperlipidemia    Hypertension    Rheumatoid arthritis (HCC)    Rheumatoid arthritis(714.0)    Scleritis 03/22/2016   Seasonal allergies    Type II diabetes mellitus (HCC)     Family History  Problem Relation Age of Onset   Hypertension Mother  Anxiety disorder Mother    Colon polyps Father    Prostate cancer Father    Breast cancer Cousin 26   Colon cancer Neg Hx    Past Surgical History:  Procedure Laterality Date   APPENDECTOMY  1968   CATARACT EXTRACTION W/ INTRAOCULAR LENS  IMPLANT, BILATERAL Bilateral 11/2013-12/2013   KNEE ARTHROPLASTY     KNEE ARTHROSCOPY Left 1980's-1990's X 3   MYOMECTOMY   1990's   NASOPHARYNGEAL BIOPSY N/A 11/16/2021   Procedure: NASOPHARYNGEAL BIOPSY;  Surgeon: Melida Quitter, MD;  Location: [facility] OR;  Service: ENT;  Laterality: N/A;   Social History   Social History Narrative   Right Handed   Lives in a two story home    Immunization History  Administered Date(s) Administered   PFIZER(Purple Top)SARS-COV-2 Vaccination 04/23/2019, 05/12/2019, 01/28/2020     Objective: Vital Signs: BP 131/81 (BP Location: Left Arm, Patient Position: Sitting, Cuff Size: Normal)   Pulse 62   Resp 16   Wt 134 lb (60.8 kg)   LMP 07/09/2014   BMI 23.00 kg/m    Physical Exam Vitals and nursing note reviewed.  Constitutional:      Appearance: She is well-developed.  HENT:     Head: Normocephalic and atraumatic.  Eyes:     Conjunctiva/sclera: Conjunctivae normal.  Cardiovascular:     Rate and Rhythm: Normal rate and regular rhythm.     Heart sounds: Normal heart sounds.  Pulmonary:     Effort: Pulmonary effort is normal.     Breath sounds: Normal breath sounds.  Abdominal:     General: Bowel sounds are normal.     Palpations: Abdomen is soft.  Musculoskeletal:     Cervical back: Normal range of motion.  Skin:    General: Skin is warm and dry.     Capillary Refill: Capillary refill takes less than 2 seconds.  Neurological:     Mental Status: She is alert and oriented to person, place, and time.  Psychiatric:        Behavior: Behavior normal.      Musculoskeletal Exam: C-spine has limited range of motion with lateral rotation.  No midline spinal tenderness.  No SI joint tenderness.  Shoulder joints have good range of motion bilaterally.  Elbow joints have good range of motion with no tenderness or inflammation.  Tenderness over both wrist joints, left greater than right.  Tenderness over several MCP and PIP joints.  Hip joints have good range of motion with no groin pain.  Limited extension of the left knee joint but no effusion noted.  Right knee has good range  of motion with no warmth or effusion.  Ankle joints have good range of motion with no tenderness or joint swelling.   CDAI Exam: CDAI Score: 12  Patient Global: 5 mm; Provider Global: 5 mm Swollen: 0 ; Tender: 11  Joint Exam 04/03/2022      Right  Left  Wrist   Tender   Tender  MCP 2   Tender     MCP 4      Tender  PIP 2   Tender   Tender  PIP 3   Tender   Tender  PIP 4      Tender  Knee   Tender   Tender     Investigation: No additional findings.  Imaging: No results found.  Recent Labs: Lab Results  Component Value Date   WBC 6.4 03/28/2022   HGB 12.1 03/28/2022   PLT 360 03/28/2022  NA 136 03/28/2022   K 4.2 03/28/2022   CL 98 03/28/2022   CO2 30 03/28/2022   GLUCOSE 447 (H) 03/28/2022   BUN 16 03/28/2022   CREATININE 1.02 03/28/2022   BILITOT 0.3 03/28/2022   ALKPHOS 60 12/20/2021   AST 14 03/28/2022   ALT 17 03/28/2022   PROT 7.6 03/28/2022   ALBUMIN 3.5 12/20/2021   CALCIUM 9.6 03/28/2022   GFRAA >60 12/17/2019   QFTBGOLD Negative 09/18/2016   QFTBGOLDPLUS Negative 01/18/2022    Speciality Comments: Remicade 6mg / kg every 5 weeks-ACY 12/25/19 MTX -dcd 04/21 due to GI SEs  Procedures:  No procedures performed Allergies: Latex, Lisinopril, Penicillins, and Methotrexate derivatives   Assessment / Plan:     Visit Diagnoses: Rheumatoid arthritis involving multiple sites with positive rheumatoid factor (HCC) - + RF, Anti-CCP +, ANA negative: Patient presents today with ongoing pain and stiffness involving multiple joints.  Her symptoms have been most severe in the left wrist and both knee joints.  She has tenderness over both wrist joints and several PIP joints as described above.  No knee joint effusions noted on exam.  She is currently prescribed in office administered Cimzia.  Her third dose of Cimzia was administered on 03/28/2022 and she is scheduled for her fourth dose on 04/27/2022.  She has been tolerating Cimzia without any side effects or injection  site reactions.  She has noticed about a 35% improvement in her symptoms.  She is unable to take prednisone due to uncontrolled type 2 diabetes.  He has been taking Tylenol and/or aleve for pain relief.  She will remain on Cimzia as prescribed.  She was advised to notify us if she develops signs or symptoms of a flare.  She is willing to give Cimzia more time and for [place] to reassess for the full efficacy at her follow up visit.  She will follow-up in the office in 2 to 3 months.   High risk medication use - In-office administered Cimzia injections.  Initiated cimzia on 02/27/22.  CBC and CMP 03/28/22.   Her next lab work will be due in February and every 3 months to monitor for drug toxicity. TB gold negative on 12/2021.  She has not had any recent or recurrent infections.  Discussed the importance of holding Cimzia if she develops signs or symptoms of an infection and to resume once the infection has completely cleared.  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  No recent flares.  Primary osteoarthritis of both hands: PIP and DIP thickening consistent with osteoarthritis of both hands.  She has tenderness of several MCP and PIP joints on examination today.  Discussed the importance of joint protection and muscle strengthening.  Trochanteric bursitis, right hip: No tenderness upon palpation currently.   Primary osteoarthritis of both knees: She is currently experiencing increased discomfort in both knee joints.  She has limited extension of the left knee joint on examination today.  No effusion noted.   DDD (degenerative disc disease), cervical: C-spine has slightly limited range of motion especially with lateral rotation.  No symptoms of radiculopathy.  DDD (degenerative disc disease), lumbar: No midline spinal tenderness.  No symptoms of radiculopathy.  Osteopenia of multiple sites - DEXA updated on 06/06/21: The BMD measured at Femur Neck Left is 0.843 g/cm2 with a T-score of -1.4.  Other  medical conditions are listed as follows:  History of vitamin D deficiency  Essential hypertension: Blood pressure was 131/81 today in the office.  History of diabetes  mellitus, type II: She is not a good candidate for prednisone use.  Orders: No orders of the defined types were placed in this encounter.  No orders of the defined types were placed in this encounter.    Follow-Up Instructions: Return in about 3 months (around 07/03/2022) for Rheumatoid arthritis, Osteoarthritis, DDD.   Ofilia Neas, PA-C  Note - This record has been created using Dragon software.  Chart creation errors have been sought, but may not always  have been located. Such creation errors do not reflect on  the standard of medical care.

## 2022-03-27 ENCOUNTER — Ambulatory Visit: Payer: Medicare Other

## 2022-03-28 ENCOUNTER — Ambulatory Visit: Payer: Medicare Other | Attending: Rheumatology | Admitting: *Deleted

## 2022-03-28 VITALS — BP 150/82 | HR 65

## 2022-03-28 DIAGNOSIS — M0579 Rheumatoid arthritis with rheumatoid factor of multiple sites without organ or systems involvement: Secondary | ICD-10-CM

## 2022-03-28 DIAGNOSIS — Z79899 Other long term (current) drug therapy: Secondary | ICD-10-CM

## 2022-03-28 MED ORDER — CERTOLIZUMAB PEGOL 2 X 200 MG ~~LOC~~ KIT
400.0000 mg | PACK | Freq: Once | SUBCUTANEOUS | Status: AC
  Administered 2022-03-28: 400 mg via SUBCUTANEOUS

## 2022-03-28 NOTE — Progress Notes (Signed)
Subjective:   Patient presents to clinic today to receive monthly dose of Cimzia.  Patient running a fever or have signs/symptoms of infection? No  Patient currently on antibiotics for the treatment of infection? No  Patient have any upcoming invasive procedures/surgeries? No  Objective: CMP     Component Value Date/Time   NA 136 02/10/2022 1022   K 4.0 02/10/2022 1022   CL 95 (L) 02/10/2022 1022   CO2 33 (H) 02/10/2022 1022   GLUCOSE 347 (H) 02/10/2022 1022   BUN 13 02/10/2022 1022   CREATININE 0.78 02/10/2022 1022   CALCIUM 10.1 02/10/2022 1022   PROT 7.7 02/10/2022 1022   ALBUMIN 3.5 12/20/2021 0824   AST 11 02/10/2022 1022   ALT 10 02/10/2022 1022   ALKPHOS 60 12/20/2021 0824   BILITOT 0.4 02/10/2022 1022   GFRNONAA >60 12/20/2021 0824   GFRAA >60 12/17/2019 0913    CBC    Component Value Date/Time   WBC 8.2 02/10/2022 1022   RBC 4.38 02/10/2022 1022   HGB 13.1 02/10/2022 1022   HCT 39.9 02/10/2022 1022   PLT 393 02/10/2022 1022   MCV 91.1 02/10/2022 1022   MCH 29.9 02/10/2022 1022   MCHC 32.8 02/10/2022 1022   RDW 12.0 02/10/2022 1022   LYMPHSABS 2,485 02/10/2022 1022   MONOABS 0.5 12/20/2021 0824   EOSABS 172 02/10/2022 1022   BASOSABS 49 02/10/2022 1022    Baseline Immunosuppressant Therapy Labs TB GOLD    Latest Ref Rng & Units 01/18/2022    8:41 AM  Quantiferon TB Gold  Quantiferon TB Gold Plus Negative Negative    Hepatitis Panel   HIV No results found for: "HIV" Immunoglobulins   SPEP    Latest Ref Rng & Units 02/10/2022   10:22 AM  Serum Protein Electrophoresis  Total Protein 6.1 - 8.1 g/dL 7.7    G6PD No results found for: "G6PDH" TPMT No results found for: "TPMT"   Chest x-ray: 05/02/2010 No acute cardiopulmonary disease.  Scoliosis.   Assessment/Plan:   Administrations This Visit     certolizumab pegol (CIMZIA) kit 400 mg     Admin Date 03/28/2022 Action Given Dose 400 mg Route Subcutaneous Administered By Carole Binning, LPN             Patient tolerated injection well.   Appointment for next injection scheduled for 04/27/2022.  Patient due for labs today and had them drawn in office today. Patient is to call and reschedule appointment if running a fever with signs/symptoms of infection, on antibiotics for active infection or has an upcoming invasive procedure.  All questions encouraged and answered.  Instructed patient to call with any further questions or concerns.

## 2022-03-29 LAB — CBC WITH DIFFERENTIAL/PLATELET
Absolute Monocytes: 371 cells/uL (ref 200–950)
Basophils Absolute: 38 cells/uL (ref 0–200)
Basophils Relative: 0.6 %
Eosinophils Absolute: 179 cells/uL (ref 15–500)
Eosinophils Relative: 2.8 %
HCT: 36.2 % (ref 35.0–45.0)
Hemoglobin: 12.1 g/dL (ref 11.7–15.5)
Lymphs Abs: 2720 cells/uL (ref 850–3900)
MCH: 30 pg (ref 27.0–33.0)
MCHC: 33.4 g/dL (ref 32.0–36.0)
MCV: 89.6 fL (ref 80.0–100.0)
MPV: 9.9 fL (ref 7.5–12.5)
Monocytes Relative: 5.8 %
Neutro Abs: 3091 cells/uL (ref 1500–7800)
Neutrophils Relative %: 48.3 %
Platelets: 360 10*3/uL (ref 140–400)
RBC: 4.04 10*6/uL (ref 3.80–5.10)
RDW: 12.1 % (ref 11.0–15.0)
Total Lymphocyte: 42.5 %
WBC: 6.4 10*3/uL (ref 3.8–10.8)

## 2022-03-29 LAB — COMPLETE METABOLIC PANEL WITH GFR
AG Ratio: 1 (calc) (ref 1.0–2.5)
ALT: 17 U/L (ref 6–29)
AST: 14 U/L (ref 10–35)
Albumin: 3.8 g/dL (ref 3.6–5.1)
Alkaline phosphatase (APISO): 60 U/L (ref 37–153)
BUN: 16 mg/dL (ref 7–25)
CO2: 30 mmol/L (ref 20–32)
Calcium: 9.6 mg/dL (ref 8.6–10.4)
Chloride: 98 mmol/L (ref 98–110)
Creat: 1.02 mg/dL (ref 0.50–1.05)
Globulin: 3.8 g/dL (calc) — ABNORMAL HIGH (ref 1.9–3.7)
Glucose, Bld: 447 mg/dL — ABNORMAL HIGH (ref 65–139)
Potassium: 4.2 mmol/L (ref 3.5–5.3)
Sodium: 136 mmol/L (ref 135–146)
Total Bilirubin: 0.3 mg/dL (ref 0.2–1.2)
Total Protein: 7.6 g/dL (ref 6.1–8.1)
eGFR: 63 mL/min/{1.73_m2} (ref >=60)

## 2022-03-29 NOTE — Progress Notes (Signed)
Glucose is high at 447.  Please notify patient and her PCPs office.  CBC is normal.

## 2022-03-31 ENCOUNTER — Telehealth: Payer: Self-pay | Admitting: Pharmacist

## 2022-03-31 ENCOUNTER — Encounter: Payer: Self-pay | Admitting: Pharmacist

## 2022-03-31 NOTE — Telephone Encounter (Addendum)
Error

## 2022-03-31 NOTE — Telephone Encounter (Signed)
Submitted Cimzia reverification of benefits in [employer] Portal.   Patient confirmed today that she will keep her [facility] Medicare as her sole insurance in 2024.   Chesley Mires, PharmD, MPH, BCPS, CPP Clinical Pharmacist (Rheumatology and Pulmonology)

## 2022-04-03 ENCOUNTER — Ambulatory Visit: Payer: Medicare Other | Attending: Physician Assistant | Admitting: Physician Assistant

## 2022-04-03 ENCOUNTER — Encounter: Payer: Self-pay | Admitting: Physician Assistant

## 2022-04-03 VITALS — BP 131/81 | HR 62 | Resp 16 | Wt 134.0 lb

## 2022-04-03 DIAGNOSIS — M8589 Other specified disorders of bone density and structure, multiple sites: Secondary | ICD-10-CM

## 2022-04-03 DIAGNOSIS — H15003 Unspecified scleritis, bilateral: Secondary | ICD-10-CM | POA: Diagnosis not present

## 2022-04-03 DIAGNOSIS — M7061 Trochanteric bursitis, right hip: Secondary | ICD-10-CM

## 2022-04-03 DIAGNOSIS — M19042 Primary osteoarthritis, left hand: Secondary | ICD-10-CM

## 2022-04-03 DIAGNOSIS — M0579 Rheumatoid arthritis with rheumatoid factor of multiple sites without organ or systems involvement: Secondary | ICD-10-CM | POA: Diagnosis not present

## 2022-04-03 DIAGNOSIS — Z79899 Other long term (current) drug therapy: Secondary | ICD-10-CM | POA: Diagnosis not present

## 2022-04-03 DIAGNOSIS — M19041 Primary osteoarthritis, right hand: Secondary | ICD-10-CM

## 2022-04-03 DIAGNOSIS — Z8639 Personal history of other endocrine, nutritional and metabolic disease: Secondary | ICD-10-CM

## 2022-04-03 DIAGNOSIS — M5030 Other cervical disc degeneration, unspecified cervical region: Secondary | ICD-10-CM

## 2022-04-03 DIAGNOSIS — M5136 Other intervertebral disc degeneration, lumbar region: Secondary | ICD-10-CM

## 2022-04-03 DIAGNOSIS — M170 Bilateral primary osteoarthritis of knee: Secondary | ICD-10-CM

## 2022-04-03 DIAGNOSIS — M51369 Other intervertebral disc degeneration, lumbar region without mention of lumbar back pain or lower extremity pain: Secondary | ICD-10-CM

## 2022-04-03 DIAGNOSIS — I10 Essential (primary) hypertension: Secondary | ICD-10-CM

## 2022-04-03 NOTE — Patient Instructions (Signed)
Standing Labs We placed an order today for your standing lab work.   Please have your standing labs drawn in February and every 3 months   Please have your labs drawn 2 weeks prior to your appointment so that the provider can discuss your lab results at your appointment.  Please note that you may see your imaging and lab results in MyChart before we have reviewed them. We will contact you once all results are reviewed. Please allow our office up to 72 hours to thoroughly review all of the results before contacting the office for clarification of your results.  Lab hours are:   Monday through Thursday from 8:00 am -12:30 pm and 1:00 pm-5:00 pm and Friday from 8:00 am-12:00 pm.  Please be advised, all patients with office appointments requiring lab work will take precedent over walk-in lab work.   Labs are drawn by [facility]. Please bring your co-pay at the time of your lab draw.  You may receive a bill from [facility] for your lab work.  Please note if you are on Hydroxychloroquine and and an order has been placed for a Hydroxychloroquine level, you will need to have it drawn 4 hours or more after your last dose.  If you wish to have your labs drawn at another location, please call the office 24 hours in advance so we can fax the orders.  The office is located at [address], [address], [place], [place] [address] No appointment is necessary.    If you have any questions regarding directions or hours of operation,  please call [phone].   As a reminder, please drink plenty of water prior to coming for your lab work. Thanks!  

## 2022-04-05 ENCOUNTER — Emergency Department (HOSPITAL_BASED_OUTPATIENT_CLINIC_OR_DEPARTMENT_OTHER): Payer: Medicare Other

## 2022-04-05 ENCOUNTER — Emergency Department (HOSPITAL_BASED_OUTPATIENT_CLINIC_OR_DEPARTMENT_OTHER): Payer: Medicare Other | Admitting: Radiology

## 2022-04-05 ENCOUNTER — Emergency Department (HOSPITAL_BASED_OUTPATIENT_CLINIC_OR_DEPARTMENT_OTHER)
Admission: EM | Admit: 2022-04-05 | Discharge: 2022-04-05 | Disposition: A | Discharge status: Home / Self Care | Payer: Medicare Other | Attending: Emergency Medicine | Admitting: Emergency Medicine

## 2022-04-05 ENCOUNTER — Encounter (HOSPITAL_BASED_OUTPATIENT_CLINIC_OR_DEPARTMENT_OTHER): Payer: Self-pay

## 2022-04-05 ENCOUNTER — Other Ambulatory Visit: Payer: Self-pay

## 2022-04-05 DIAGNOSIS — R519 Headache, unspecified: Secondary | ICD-10-CM | POA: Insufficient documentation

## 2022-04-05 DIAGNOSIS — S92255A Nondisplaced fracture of navicular [scaphoid] of left foot, initial encounter for closed fracture: Secondary | ICD-10-CM | POA: Diagnosis not present

## 2022-04-05 DIAGNOSIS — M25562 Pain in left knee: Secondary | ICD-10-CM | POA: Diagnosis not present

## 2022-04-05 DIAGNOSIS — M545 Low back pain, unspecified: Secondary | ICD-10-CM | POA: Diagnosis not present

## 2022-04-05 DIAGNOSIS — S99921A Unspecified injury of right foot, initial encounter: Secondary | ICD-10-CM | POA: Diagnosis present

## 2022-04-05 DIAGNOSIS — W108XXA Fall (on) (from) other stairs and steps, initial encounter: Secondary | ICD-10-CM | POA: Diagnosis not present

## 2022-04-05 MED ORDER — ACETAMINOPHEN 325 MG PO TABS
650.0000 mg | ORAL_TABLET | Freq: Once | ORAL | Status: AC
  Administered 2022-04-05: 650 mg via ORAL
  Filled 2022-04-05: qty 2

## 2022-04-05 NOTE — ED Notes (Signed)
Pt given ice for the left ankle and left knee.

## 2022-04-05 NOTE — ED Triage Notes (Signed)
Pt states she fell this morning, missing a step on her outdoor concrete steps. Pt c/o left foot and knee pain, as well as back pain.   No thinners.

## 2022-04-05 NOTE — Discharge Instructions (Signed)
Please call make an appointment tomorrow with orthopedic surgery for navicular fracture and for evaluation of the talonavicular ligament.  Rest, elevate foot, ice, Motrin, and no weightbearing activities.

## 2022-04-05 NOTE — ED Notes (Signed)
Discharge paperwork given and verbally understood. 

## 2022-04-05 NOTE — ED Provider Notes (Signed)
[facility] EMERGENCY DEPT Provider Note   CSN: [id number] Arrival date & time: 04/05/22  8182     History  Chief Complaint  Patient presents with   Serena Petterson is a 61 y.o. female.  Patient is a 61 year old female presenting for fall.  Patient states she missed a concrete step and fell backwards.  Unknown head trauma.  Denies LOC.  Denies blood thinner use.  Has pain in the left foot, left knee, and lower back.  Admits to a headache without nausea or vomiting.  Has any sensation or motor deficits.  The history is provided by the patient. No language interpreter was used.  Fall Pertinent negatives include no chest pain, no abdominal pain and no shortness of breath.       Home Medications Prior to Admission medications   Medication Sig Start Date End Date Taking? Authorizing Provider  acetaminophen (TYLENOL) 500 MG tablet Take 1,000 mg by mouth every 6 (six) hours as needed for pain.    [provider]  amLODipine (NORVASC) 5 MG tablet Take 1 tablet (5 mg total) by mouth daily. 07/16/14   Drema Dallas, MD  atorvastatin (LIPITOR) 40 MG tablet Take 40 mg by mouth daily.    [provider]  cetirizine (ZYRTEC ALLERGY) 10 MG tablet Take 1 tablet (10 mg total) by mouth daily. 05/20/17   Wallis Bamberg, PA-C  clindamycin (CLEOCIN) 150 MG capsule Take 2 capsules (300 mg total) by mouth 3 (three) times daily. Patient not taking: Reported on 02/10/2022 10/23/21   Kommor, Wyn Forster, MD  cyclobenzaprine (FLEXERIL) 10 MG tablet Take 1 tablet (10 mg total) by mouth 2 (two) times daily as needed for muscle spasms. Patient not taking: Reported on 02/10/2022 12/06/20   Virgina Norfolk, DO  diclofenac (VOLTAREN) 75 MG EC tablet Take 75 mg by mouth 2 (two) times daily as needed. 03/09/22   [provider]  ezetimibe (ZETIA) 10 MG tablet Take 10 mg by mouth daily. 10/20/21   [provider]  famotidine (PEPCID) 20 MG tablet Take 1 tablet (20 mg  total) by mouth 2 (two) times daily. Patient taking differently: Take 20 mg by mouth as needed. 07/16/14   Drema Dallas, MD  folic acid (FOLVITE) 1 MG tablet Take 1 mg by mouth daily.    [provider]  glimepiride (AMARYL) 2 MG tablet     [provider]  HUMALOG KWIKPEN 100 UNIT/ML KwikPen Inject 5-10 Units into the skin 3 (three) times daily before meals. Dose per sliding scale? 07/05/21   [provider]  hydrochlorothiazide (HYDRODIURIL) 12.5 MG tablet Take 12.5 mg by mouth daily.    [provider]  inFLIXimab (REMICADE) 100 MG injection Inject into the vein. Patient not taking: Reported on 04/03/2022    [provider]  leflunomide (ARAVA) 10 MG tablet Take 1 tab by mouth daily for 2 weeks. RECHECK LABS. Then take 2 tablets by mouth daily thereafter. Patient taking differently: Take 10 mg by mouth daily. Take 1 tab by mouth daily for 2 weeks. RECHECK LABS. Then take 2 tablets by mouth daily thereafter. 12/28/21   Pollyann Savoy, MD  methylPREDNISolone (MEDROL DOSEPAK) 4 MG TBPK tablet Take by mouth as directed. (Typical regimens for 21 tablet dose packs of Methylprednisolone 4mg , Prednisone 5mg , and Prednisone 10mg ) Day 1: 2 tabs before breakfast, 1 tab after lunch, 1 tab after supper, and 2 tabs at bedtime. Day 2: 1 tab before breakfast, 1 tab after  lunch, 1 tab after supper, and 2 tabs at bedtime. Day 3: 1 tab before breakfast, 1 tab after lunch, 1 tab after supper, and 1 tab at bedtime. Day 4: 1 tab before breakfast, 1 tab after lunch, and 1 tab at bedtime. Day 5: 1 tab before breakfast and 1 tab at bedtime. Day 6: 1 tab before breakfast. Patient not taking: Reported on 12/28/2021 10/25/21   [provider]  potassium chloride (K-DUR) 10 MEQ tablet Take 10 mEq by mouth daily. 03/03/16   [provider]  TRESIBA FLEXTOUCH 100 UNIT/ML FlexTouch Pen SMARTSIG:30 Unit(s) SUB-Q Every Evening 03/22/22   [provider]  TRULICITY  0.75 MG/0.5ML SOPN Inject 0.75 mg into the skin once a week. Takes on Friday 08/15/20   [provider]      Allergies    Latex, Lisinopril, Penicillins, and Methotrexate derivatives    Review of Systems   Review of Systems  Constitutional:  Negative for chills and fever.  HENT:  Negative for ear pain and sore throat.   Eyes:  Negative for pain and visual disturbance.  Respiratory:  Negative for cough and shortness of breath.   Cardiovascular:  Negative for chest pain and palpitations.  Gastrointestinal:  Negative for abdominal pain and vomiting.  Genitourinary:  Negative for dysuria and hematuria.  Musculoskeletal:  Positive for back pain. Negative for arthralgias.  Skin:  Negative for color change and rash.  Neurological:  Negative for seizures and syncope.  All other systems reviewed and are negative.   Physical Exam Updated Vital Signs BP (!) 147/77 (BP Location: Right Arm)   Pulse (!) 57   Temp 98.8 F (37.1 C)   Resp 16   Ht 5\' 4"  (1.626 m)   Wt 60.8 kg   LMP 07/09/2014   SpO2 100%   BMI 23.00 kg/m  Physical Exam Vitals and nursing note reviewed.  Constitutional:      General: She is not in acute distress.    Appearance: She is well-developed.  HENT:     Head: Normocephalic and atraumatic.  Eyes:     Conjunctiva/sclera: Conjunctivae normal.  Cardiovascular:     Rate and Rhythm: Normal rate and regular rhythm.     Pulses:          Dorsalis pedis pulses are 2+ on the right side and 2+ on the left side.     Heart sounds: No murmur heard. Pulmonary:     Effort: Pulmonary effort is normal. No respiratory distress.     Breath sounds: Normal breath sounds.  Chest:     Chest wall: Tenderness present. No deformity or swelling.    Abdominal:     Palpations: Abdomen is soft.     Tenderness: There is no abdominal tenderness.  Musculoskeletal:        General: No swelling.     Right shoulder: Normal.     Left shoulder: Normal.     Right upper arm: Normal.      Left upper arm: Normal.     Right elbow: Normal.     Left elbow: Normal.     Right forearm: Normal.     Left forearm: Normal.     Right wrist: Normal.     Left wrist: Normal.     Right hand: Normal.     Left hand: Normal.     Cervical back: Normal and neck supple.     Thoracic back: Normal.     Lumbar back: Tenderness and bony tenderness present.  No deformity.     Right hip: Normal.     Left hip: Normal.     Right upper leg: Normal.     Left upper leg: Normal.     Right knee: Normal.     Left knee: Bony tenderness present.     Right lower leg: Normal.     Left lower leg: Normal.     Right ankle: Normal.     Left ankle: Normal.     Right foot: Normal.     Left foot: Swelling and bony tenderness present. No deformity.       Legs:  Skin:    General: Skin is warm and dry.     Capillary Refill: Capillary refill takes less than 2 seconds.  Neurological:     Mental Status: She is alert and oriented to person, place, and time.     GCS: GCS eye subscore is 4. GCS verbal subscore is 5. GCS motor subscore is 6.     Cranial Nerves: Cranial nerves 2-12 are intact.     Sensory: Sensation is intact.     Motor: Motor function is intact.     Coordination: Coordination is intact.  Psychiatric:        Mood and Affect: Mood normal.     ED Results / Procedures / Treatments   Labs (all labs ordered are listed, but only abnormal results are displayed) Labs Reviewed - No data to display  EKG None  Radiology No results found.  Procedures Procedures    Medications Ordered in ED Medications  acetaminophen (TYLENOL) tablet 650 mg (650 mg Oral Given 04/05/22 1147)    ED Course/ Medical Decision Making/ A&P                           Medical Decision Making Amount and/or Complexity of Data Reviewed Radiology: ordered.  Risk OTC drugs.   9:13 AM Patient is a 61 year old female presenting for foot pain and rib pain after mechanical fall today.  Patient is Aox3, no acute  distress, afebrile, with stable vitals. Physical exam demonstrates no gross deformities, ecchymosis, or skin tears but positive for bony tenderness over the left ribs and left dorsal foot. Some swelling is noted over the foot. CT head and neck ordered due to unknown head trauma and loc status in a 61 yo with complaints of headache. CT head and neck demonstrates no acute process. No rib fractures on xray imaging. Xray of the foot demonstrates a small acute avulsion fracture at the dorsal navicular bone. Leg neurovascularly intact. Patient placed in splint and recommended for close follow up with orthopedic surgery due to this fracture location frequently being associated with ligamentous injury. Patient recommended for rest, ice, elevation or the limb, and return to ED for any concerning sensation/motor function loss. Pt able to tolerate crutches. Teaching instructions provided in ED.  Patient in no distress and overall condition improved here in the ED. Detailed discussions were had with the patient regarding current findings, and need for close f/u with PCP or on call doctor. The patient has been instructed to return immediately if the symptoms worsen in any way for re-evaluation. Patient verbalized understanding and is in agreement with current care plan. All questions answered prior to discharge.         Final Clinical Impression(s) / ED Diagnoses Final diagnoses:  Closed nondisplaced fracture of navicular bone of left foot, initial encounter    Rx / DC Orders  ED Discharge Orders     None         Franne Forts, DO 04/10/22 [phone]

## 2022-04-07 ENCOUNTER — Ambulatory Visit (INDEPENDENT_AMBULATORY_CARE_PROVIDER_SITE_OTHER): Payer: Medicare Other | Admitting: Physician Assistant

## 2022-04-07 ENCOUNTER — Encounter: Payer: Self-pay | Admitting: Physician Assistant

## 2022-04-07 DIAGNOSIS — M79672 Pain in left foot: Secondary | ICD-10-CM

## 2022-04-07 NOTE — Progress Notes (Signed)
Office Visit Note   Patient: Wendy Woods           Date of Birth: 03-Sep-1960           MRN: [id number] Visit Date: 04/07/2022              Requested by: Fleet Contras, MD [address] [place],  [name] [address] PCP: Fleet Contras, MD   Assessment & Plan: Visit Diagnoses:  1. Pain in left foot     Plan: Wendy Woods is a pleasant 61 year old woman who is 3 days status post falling backward onto her back on the cement.  She was seen and evaluated at the emergency room and studies were done which included CT scans of her neck lower back as well as x-rays of her elbow and her foot.  She was referred today for evaluation of an avulsion fracture to the dorsum of the navicular bone on her left foot.  She was placed in a posterior splint.  She does have a history of rheumatoid arthritis.  She is hypersensitive to palpation in all areas but mostly sensitive over the dorsal navicular bone.  She really has a minimal amount of swelling no plantar ecchymosis.  She is able to dorsiflex her foot.  She has pain with eversion and inversion and is limited to do this by pain.  She has seen Dr. Cleophas Dunker in the past.  Would like her to follow-up with a new x-ray of her foot left next week in the meantime she will be placed in a short cam boot.  She is taking Tylenol for pain. Return in about 1 week (around 04/14/2022).   Orders:  No orders of the defined types were placed in this encounter.  No orders of the defined types were placed in this encounter.     Procedures: No procedures performed   Clinical Data: No additional findings.   Subjective: No chief complaint on file.   HPI pleasant 61 year old woman who fell down onto her back a few days ago after missing a step.  Here to be evaluated for fracture in her left foot  Review of Systems  All other systems reviewed and are negative.    Objective: Vital Signs: LMP 07/09/2014   Physical Exam Constitutional:      Appearance: Normal  appearance.  Pulmonary:     Effort: Pulmonary effort is normal.  Skin:    General: Skin is warm and dry.  Neurological:     Mental Status: She is alert.     Ortho Exam Left foot post splint was removed compartments are soft and nontender she has minimal soft tissue swelling in her left foot easily palpable dorsalis pedis pulse.  She has dorsiflexion though is somewhat painful.  She is also tender even to light palpation over the lateral and medial foot.  No plantar ecchymosis.  She has difficulty everting secondary to pain as well as inverting.  No tenderness in her ankle or lower leg Specialty Comments:  No specialty comments available.  Imaging: No results found.   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   Past Medical History:  Diagnosis Date   Asthma    as a child   DDD (degenerative disc disease), cervical 03/22/2016   DDD (degenerative disc disease), lumbar 03/22/2016   GERD (gastroesophageal reflux disease)    Hyperlipidemia    Hypertension    Rheumatoid arthritis (HCC)    Rheumatoid arthritis(714.0)    Scleritis 03/22/2016   Seasonal allergies    Type II diabetes mellitus (HCC)     Family History  Problem Relation Age of Onset   Hypertension Mother    Anxiety disorder Mother    Colon polyps Father    Prostate cancer Father    Breast cancer Cousin 26   Colon cancer Neg Hx     Past Surgical History:  Procedure  Laterality Date   APPENDECTOMY  1968   CATARACT EXTRACTION W/ INTRAOCULAR LENS  IMPLANT, BILATERAL Bilateral 11/2013-12/2013   KNEE ARTHROPLASTY     KNEE ARTHROSCOPY Left 7253'G-6440'H X 3   MYOMECTOMY  1990's   NASOPHARYNGEAL BIOPSY N/A 11/16/2021   Procedure: NASOPHARYNGEAL BIOPSY;  Surgeon: Christia Reading, MD;  Location: [facility] OR;  Service: ENT;  Laterality: N/A;   Social History   Occupational History   Not on file  Tobacco Use   Smoking status: Never    Passive exposure: Never   Smokeless tobacco: Never  Vaping Use   Vaping Use: Never used  Substance and Sexual Activity   Alcohol use: No    Alcohol/week: 0.0 standard drinks of alcohol   Drug use: No   Sexual activity: Not Currently

## 2022-04-13 ENCOUNTER — Ambulatory Visit (INDEPENDENT_AMBULATORY_CARE_PROVIDER_SITE_OTHER): Payer: Medicare Other | Admitting: Orthopaedic Surgery

## 2022-04-13 ENCOUNTER — Encounter: Payer: Self-pay | Admitting: Orthopaedic Surgery

## 2022-04-13 DIAGNOSIS — M79672 Pain in left foot: Secondary | ICD-10-CM | POA: Diagnosis not present

## 2022-04-13 NOTE — Progress Notes (Signed)
Office Visit Note   Patient: Wendy Woods           Date of Birth: 1961/04/18           MRN: [id number] Visit Date: 04/13/2022              Requested by: Fleet Contras, MD [address] [place],  [name] [address] PCP: Fleet Contras, MD   Assessment & Plan: Visit Diagnoses:  1. Pain in left foot     Plan: Ms. Meriweather is 9 days status post falling backwards after mis-stepping at home.  She was seen and evaluated in the emergency room including evaluations of her cervical spine and low back left foot, left ankle and right elbow.  All of her x-rays and studies were negative with the exception of her left foot x-ray which demonstrated a small avulsion off the navicular.  She has been in a short cam walker boot.  She is diabetic but denies any neuropathy.  While she finds the boot awkward when she does not wear it she is painful.  Examination she still has some swelling in her ankle and her foot and although the avulsion is more medial she is more tender over the lateral side of her foot.  She also has some mild sensation change and what would be the superficial peroneal nerve distribution.  She does not exactly remember how her foot landed but certainly this could represent a lateral ankle and foot sprain.  She is not tender over the Lisfranc complex.  She may treat this symptomatically and if she is still comfortable in the boot she may continue to wear this.  She can wean out of the boot to a comfortable shoe as she feels comfortable.  She will follow-up in 3 weeks.    Follow-Up Instructions: Return in about 3 weeks (around 05/04/2022).   Orders:  No orders of the defined types were placed in this encounter.  No orders of the defined types were placed in this encounter.     Procedures: No procedures performed   Clinical Data: No additional findings.   Subjective: Chief Complaint  Patient presents with   Right Foot - Follow-up    HPI Ms. Stoklosa is here 9 days follow-up status  post falling off a step backward.  Her biggest complaint is her left foot.  She is wearing a short cam boot and is still uncomfortable ambulating without it  Review of Systems  All other systems reviewed and are negative.    Objective: Vital Signs: LMP 07/09/2014   Physical Exam Constitutional:      Appearance: Normal appearance.  Pulmonary:     Effort: Pulmonary effort is normal.  Skin:    General: Skin is warm and dry.  Neurological:     General: No focal deficit present.     Mental Status: She is alert.     Ortho Exam Examination of her left foot she has mild soft tissue swelling and resolving ecchymosis mostly over the lateral ankle and lateral foot.  She does have some mild altered sensation across the lateral side dorsally on her foot.  No tenderness through the Lisfranc complex.  No plantar ecchymosis.  She has good plantarflexion dorsiflexion eversion and inversion.  Good EHL function.  Able to wiggle her toes.  She is neurovascularly intact otherwise.  Her compartments in her calf are soft nontender negative Denna Haggard' sign Specialty Comments:  No specialty comments available.  Imaging: No results found.   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   Past Medical History:  Diagnosis Date   Asthma    as a child   DDD (degenerative disc disease), cervical 03/22/2016   DDD (degenerative disc disease), lumbar 03/22/2016   GERD  (gastroesophageal reflux disease)    Hyperlipidemia    Hypertension    Rheumatoid arthritis (HCC)    Rheumatoid arthritis(714.0)    Scleritis 03/22/2016   Seasonal allergies    Type II diabetes mellitus (HCC)     Family History  Problem Relation Age of Onset   Hypertension Mother    Anxiety disorder Mother    Colon polyps Father    Prostate cancer Father    Breast cancer Cousin 37   Colon cancer Neg Hx     Past Surgical History:  Procedure Laterality Date   APPENDECTOMY  1968   CATARACT EXTRACTION W/ INTRAOCULAR LENS  IMPLANT, BILATERAL Bilateral 11/2013-12/2013   KNEE ARTHROPLASTY     KNEE ARTHROSCOPY Left 9470'J-6283'M X 3   MYOMECTOMY  1990's   NASOPHARYNGEAL BIOPSY N/A 11/16/2021   Procedure: NASOPHARYNGEAL BIOPSY;  Surgeon: Christia Reading, MD;  Location: [facility] OR;  Service: ENT;  Laterality: N/A;   Social History   Occupational History   Not on file  Tobacco Use   Smoking status: Never    Passive exposure: Never   Smokeless tobacco: Never  Vaping Use   Vaping Use: Never used  Substance and Sexual Activity   Alcohol use: No    Alcohol/week: 0.0 standard drinks of alcohol   Drug use: No   Sexual activity: Not Currently

## 2022-04-27 ENCOUNTER — Encounter: Payer: Self-pay | Admitting: Physician Assistant

## 2022-04-27 ENCOUNTER — Ambulatory Visit (INDEPENDENT_AMBULATORY_CARE_PROVIDER_SITE_OTHER): Payer: Medicare Other | Admitting: Physician Assistant

## 2022-04-27 ENCOUNTER — Ambulatory Visit: Payer: Medicare Other | Attending: Rheumatology | Admitting: *Deleted

## 2022-04-27 VITALS — BP 124/87 | HR 62

## 2022-04-27 DIAGNOSIS — M79672 Pain in left foot: Secondary | ICD-10-CM | POA: Diagnosis not present

## 2022-04-27 DIAGNOSIS — M0579 Rheumatoid arthritis with rheumatoid factor of multiple sites without organ or systems involvement: Secondary | ICD-10-CM | POA: Diagnosis not present

## 2022-04-27 MED ORDER — CERTOLIZUMAB PEGOL 2 X 200 MG ~~LOC~~ KIT
400.0000 mg | PACK | Freq: Once | SUBCUTANEOUS | Status: AC
  Administered 2022-04-27: 400 mg via SUBCUTANEOUS

## 2022-04-27 NOTE — Progress Notes (Signed)
Office Visit Note   Patient: Wendy Woods           Date of Birth: 1960-08-25           MRN: [id number] Visit Date: 04/27/2022              Requested by: Fleet Contras, MD [address] [place],  [name] [address] PCP: Fleet Contras, MD  Chief Complaint  Patient presents with   Left Foot - Pain, Follow-up      HPI: Ms. Adela Lank presents today for follow-up on her left foot.  She is now over 3 weeks falling onto her back on cement.  She has been recovering fairly well with the exception of her left foot.  She says her foot is actually getting worse.  She is gone back to using her cam boot.  Denies any new injury.  Her original x-rays showed a small avulsion fracture OFF the dorsal navicular.  Assessment & Plan: Visit Diagnoses:  1. Pain in left foot     Plan: Given over 3 weeks of immobilization and continued pain I do have concerns for ligamentous injury.  She will get an MRI of the foot.  Once this is completed I will review it.  If need be I will refer to one of our foot specialist such as Dr. Lajoyce Corners in the meantime continue in the boot  Follow-Up Instructions: After MRI  Ortho Exam  Patient is alert, oriented, no adenopathy, well-dressed, normal affect, normal respiratory effort. Examination of her left foot.  She has a strong dorsalis pedis pulse foot is warm.  She does have acute tenderness over the dorsal lateral aspect of her foot.  She has good flexion extension of her foot.  Compartments are soft and compressible no cellulitis no particular tenderness with manipulation of the midfoot or the medial side of her foot  Imaging: No results found. No images are attached to the encounter.  Labs: Lab Results  Component Value Date   HGBA1C 12.9 (H) 07/04/2012   REPTSTATUS 05/23/2017 FINAL 05/20/2017   CULT NO GROUP A STREP (S.PYOGENES) ISOLATED 05/20/2017     Lab Results  Component Value Date   ALBUMIN 3.5 12/20/2021   ALBUMIN 4.2 10/23/2021   ALBUMIN 3.5  10/11/2021    No results found for: "MG" No results found for: "VD25OH"  No results found for: "PREALBUMIN"    Latest Ref Rng & Units 03/28/2022    2:10 PM 02/10/2022   10:22 AM 12/20/2021    8:24 AM  CBC EXTENDED  WBC 3.8 - 10.8 Thousand/uL 6.4  8.2  7.3   RBC 3.80 - 5.10 Million/uL 4.04  4.38  4.17   Hemoglobin 11.7 - 15.5 g/dL 96.7  89.3  81.0   HCT 35.0 - 45.0 % 36.2  39.9  37.0   Platelets 140 - 400 Thousand/uL 360  393  380   NEUT# 1,500 - 7,800 cells/uL 3,091  4,994  4.4   Lymph# 850 - 3,900 cells/uL 2,720  2,485  2.0      There is no height or weight on file to calculate BMI.  Orders:  Orders Placed This Encounter  Procedures   MR Foot Left w/o contrast   No orders of the defined types were placed in this encounter.    Procedures: No procedures performed  Clinical Data: No additional findings.  ROS:  All other systems negative, except as noted in the HPI. Review of Systems  Objective: Vital Signs: LMP 07/09/2014  Specialty Comments:  No specialty comments available.  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   Past Medical History:  Diagnosis Date   Asthma    as a child   DDD (degenerative disc disease), cervical 03/22/2016   DDD (degenerative disc disease), lumbar 03/22/2016   GERD (gastroesophageal reflux disease)    Hyperlipidemia    Hypertension    Rheumatoid arthritis (HCC)    Rheumatoid  arthritis(714.0)    Scleritis 03/22/2016   Seasonal allergies    Type II diabetes mellitus (HCC)     Family History  Problem Relation Age of Onset   Hypertension Mother    Anxiety disorder Mother    Colon polyps Father    Prostate cancer Father    Breast cancer Cousin 14   Colon cancer Neg Hx     Past Surgical History:  Procedure Laterality Date   APPENDECTOMY  1968   CATARACT EXTRACTION W/ INTRAOCULAR LENS  IMPLANT, BILATERAL Bilateral 11/2013-12/2013   KNEE ARTHROPLASTY     KNEE ARTHROSCOPY Left 3382'N-0539'J X 3   MYOMECTOMY  1990's   NASOPHARYNGEAL BIOPSY N/A 11/16/2021   Procedure: NASOPHARYNGEAL BIOPSY;  Surgeon: Christia Reading, MD;  Location: [facility] OR;  Service: ENT;  Laterality: N/A;   Social History   Occupational History   Not on file  Tobacco Use   Smoking status: Never    Passive exposure: Never   Smokeless tobacco: Never  Vaping Use   Vaping Use: Never used  Substance and Sexual Activity   Alcohol use: No    Alcohol/week: 0.0 standard drinks of alcohol   Drug use: No   Sexual activity: Not Currently

## 2022-04-27 NOTE — Progress Notes (Signed)
Subjective:   Patient presents to clinic today to receive monthly dose of Cimzia.  Patient running a fever or have signs/symptoms of infection? No  Patient currently on antibiotics for the treatment of infection? No  Patient have any upcoming invasive procedures/surgeries? No  Objective: CMP     Component Value Date/Time   NA 136 03/28/2022 1410   K 4.2 03/28/2022 1410   CL 98 03/28/2022 1410   CO2 30 03/28/2022 1410   GLUCOSE 447 (H) 03/28/2022 1410   BUN 16 03/28/2022 1410   CREATININE 1.02 03/28/2022 1410   CALCIUM 9.6 03/28/2022 1410   PROT 7.6 03/28/2022 1410   ALBUMIN 3.5 12/20/2021 0824   AST 14 03/28/2022 1410   ALT 17 03/28/2022 1410   ALKPHOS 60 12/20/2021 0824   BILITOT 0.3 03/28/2022 1410   GFRNONAA >60 12/20/2021 0824   GFRAA >60 12/17/2019 0913    CBC    Component Value Date/Time   WBC 6.4 03/28/2022 1410   RBC 4.04 03/28/2022 1410   HGB 12.1 03/28/2022 1410   HCT 36.2 03/28/2022 1410   PLT 360 03/28/2022 1410   MCV 89.6 03/28/2022 1410   MCH 30.0 03/28/2022 1410   MCHC 33.4 03/28/2022 1410   RDW 12.1 03/28/2022 1410   LYMPHSABS 2,720 03/28/2022 1410   MONOABS 0.5 12/20/2021 0824   EOSABS 179 03/28/2022 1410   BASOSABS 38 03/28/2022 1410    Baseline Immunosuppressant Therapy Labs TB GOLD    Latest Ref Rng & Units 01/18/2022    8:41 AM  Quantiferon TB Gold  Quantiferon TB Gold Plus Negative Negative    Hepatitis Panel   HIV No results found for: "HIV" Immunoglobulins   SPEP    Latest Ref Rng & Units 03/28/2022    2:10 PM  Serum Protein Electrophoresis  Total Protein 6.1 - 8.1 g/dL 7.6    G6PD No results found for: "G6PDH" TPMT No results found for: "TPMT"   Chest x-ray: 05/02/2010 No acute cardiopulmonary disease.  Scoliosis.    Assessment/Plan:   Administrations This Visit     certolizumab pegol (CIMZIA) kit 400 mg     Admin Date 04/27/2022 Action Given Dose 400 mg Route Subcutaneous Administered By Carole Binning,  LPN             Patient tolerated injection well.   Appointment for next injection scheduled for 05/25/2022.  Patient due for labs in February 2024.  Patient is to call and reschedule appointment if running a fever with signs/symptoms of infection, on antibiotics for active infection or has an upcoming invasive procedure.  All questions encouraged and answered.  Instructed patient to call with any further questions or concerns.

## 2022-05-04 ENCOUNTER — Telehealth: Payer: Self-pay | Admitting: Orthopaedic Surgery

## 2022-05-04 NOTE — Telephone Encounter (Signed)
Patient states she was supposed to have a referral for an MRI and Dr. Durward Fortes told her to follow up with another doctor but she forgot his name..please advise..[phone]

## 2022-05-05 NOTE — Telephone Encounter (Signed)
Patient's primary insurance is [employer]. Policy is effective as of 05/01/2022. [facility] is in-network. There is a 0% co-insurance for office visits.   Pre-certification is not required for J0717. CPT code [phone] and [phone] is valid and billable with 0% coinsurance. Despite PA not being required, they recommend submit. I submitted via [facility] portal today.  No authorization is required but authorization number has been confirmed.  Auth # [id number]  Knox Saliva, PharmD, MPH, BCPS, CPP Clinical Pharmacist (Rheumatology and Pulmonology)

## 2022-05-14 ENCOUNTER — Ambulatory Visit
Admission: RE | Admit: 2022-05-14 | Discharge: 2022-05-14 | Disposition: A | Discharge status: Home / Self Care | Payer: 59 | Source: Ambulatory Visit | Attending: Physician Assistant | Admitting: Physician Assistant

## 2022-05-14 DIAGNOSIS — M79672 Pain in left foot: Secondary | ICD-10-CM

## 2022-05-16 ENCOUNTER — Encounter: Payer: Self-pay | Admitting: Physician Assistant

## 2022-05-16 ENCOUNTER — Ambulatory Visit (INDEPENDENT_AMBULATORY_CARE_PROVIDER_SITE_OTHER): Payer: 59 | Admitting: Physician Assistant

## 2022-05-16 DIAGNOSIS — M79672 Pain in left foot: Secondary | ICD-10-CM

## 2022-05-16 NOTE — Progress Notes (Signed)
Office Visit Note   Patient: Wendy Woods           Date of Birth: 12-Oct-1960           MRN: [id number] Visit Date: 05/16/2022              Requested by: Nolene Ebbs, MD [address] [place],  [place] [address] PCP: Nolene Ebbs, MD  No chief complaint on file.     HPI: [name] comes in today for review of an MRI of her left foot.  She is now almost a month status post falling and injuring her left foot.  She was completely evaluated in the emergency room and was diagnosed with an avulsion fracture off the navicular.  She continued to have significant pain even after being immobilized and was concern for ligamentous injury and MRI was ordered.  She is doing a little better today but she still cannot wear regular shoe.  She does have a history of diabetes  Assessment & Plan: Visit Diagnoses: Left foot pain  Plan: MRI was reviewed with her today there was no tendon or ligamentous injuries.  She had very little arthropathy.  Only positive finding was focal marrow edema about the dorsal aspect of the navicular consistent with her recent injury.  I would like for her to give this another 3 weeks.  Because she is diabetic this may be slower to heal.  We talked about topical Voltaren gel as well as desensitization.  If she feels she can she can transition into a stiff soled shoe when she feels ready.  Will follow-up in 3 weeks  Follow-Up Instructions: Return in about 3 weeks (around 06/06/2022).   Ortho Exam  Patient is alert, oriented, no adenopathy, well-dressed, normal affect, normal respiratory effort. Examination of her left foot is warm she has a strong pulse.  She has good flexion extension inversion and eversion of her ankle.  She is still tender to palpation over the dorsal navicular.  No evidence of cellulitis or infection compartments are soft and nontender  Imaging: No results found. No images are attached to the encounter.  Labs: Lab Results  Component Value Date    HGBA1C 12.9 (H) 07/04/2012   REPTSTATUS 05/23/2017 FINAL 05/20/2017   CULT NO GROUP A STREP (S.PYOGENES) ISOLATED 05/20/2017     Lab Results  Component Value Date   ALBUMIN 3.5 12/20/2021   ALBUMIN 4.2 10/23/2021   ALBUMIN 3.5 10/11/2021    No results found for: "MG" No results found for: "VD25OH"  No results found for: "PREALBUMIN"    Latest Ref Rng & Units 03/28/2022    2:10 PM 02/10/2022   10:22 AM 12/20/2021    8:24 AM  CBC EXTENDED  WBC 3.8 - 10.8 Thousand/uL 6.4  8.2  7.3   RBC 3.80 - 5.10 Million/uL 4.04  4.38  4.17   Hemoglobin 11.7 - 15.5 g/dL 12.1  13.1  12.5   HCT 35.0 - 45.0 % 36.2  39.9  37.0   Platelets 140 - 400 Thousand/uL 360  393  380   NEUT# 1,500 - 7,800 cells/uL 3,091  4,994  4.4   Lymph# 850 - 3,900 cells/uL 2,720  2,485  2.0      There is no height or weight on file to calculate BMI.  Orders:  No orders of the defined types were placed in this encounter.  No orders of the defined types were placed in this encounter.    Procedures: No procedures performed  Clinical Data: No additional findings.  ROS:  All other systems negative, except as noted in the HPI. Review of Systems  Objective: Vital Signs: LMP 07/09/2014   Specialty Comments:  No specialty comments available.  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 07/03/2012   Past Medical History:  Diagnosis Date   Asthma    as a child   DDD  (degenerative disc disease), cervical 03/22/2016   DDD (degenerative disc disease), lumbar 03/22/2016   GERD (gastroesophageal reflux disease)    Hyperlipidemia    Hypertension    Rheumatoid arthritis ([facility])    Rheumatoid arthritis(714.0)    Scleritis 03/22/2016   Seasonal allergies    Type II diabetes mellitus ([facility])     Family History  Problem Relation Age of Onset   Hypertension Mother    Anxiety disorder Mother    Colon polyps Father    Prostate cancer Father    Breast cancer Cousin 29   Colon cancer Neg Hx     Past Surgical History:  Procedure Laterality Date   APPENDECTOMY  1968   CATARACT EXTRACTION W/ INTRAOCULAR LENS  IMPLANT, BILATERAL Bilateral 11/2013-12/2013   KNEE ARTHROPLASTY     KNEE ARTHROSCOPY Left 6440'H-4742'V X 3   MYOMECTOMY  1990's   NASOPHARYNGEAL BIOPSY N/A 11/16/2021   Procedure: NASOPHARYNGEAL BIOPSY;  Surgeon: Melida Quitter, MD;  Location: [facility];  Service: ENT;  Laterality: N/A;   Social History   Occupational History   Not on file  Tobacco Use   Smoking status: Never    Passive exposure: Never   Smokeless tobacco: Never  Vaping Use   Vaping Use: Never used  Substance and Sexual Activity   Alcohol use: No    Alcohol/week: 0.0 standard drinks of alcohol   Drug use: No   Sexual activity: Not Currently

## 2022-05-17 ENCOUNTER — Other Ambulatory Visit: Payer: 59

## 2022-05-25 ENCOUNTER — Ambulatory Visit (INDEPENDENT_AMBULATORY_CARE_PROVIDER_SITE_OTHER): Payer: 59 | Admitting: Obstetrics and Gynecology

## 2022-05-25 ENCOUNTER — Encounter: Payer: Self-pay | Admitting: Obstetrics and Gynecology

## 2022-05-25 ENCOUNTER — Other Ambulatory Visit (HOSPITAL_COMMUNITY)
Admission: RE | Admit: 2022-05-25 | Discharge: 2022-05-25 | Disposition: A | Discharge status: Home / Self Care | Payer: 59 | Source: Ambulatory Visit | Attending: Obstetrics and Gynecology | Admitting: Obstetrics and Gynecology

## 2022-05-25 ENCOUNTER — Ambulatory Visit: Payer: 59 | Attending: Rheumatology | Admitting: *Deleted

## 2022-05-25 VITALS — BP 128/84 | HR 80 | Ht 64.0 in | Wt 136.6 lb

## 2022-05-25 VITALS — BP 130/68 | HR 71

## 2022-05-25 DIAGNOSIS — Z01419 Encounter for gynecological examination (general) (routine) without abnormal findings: Secondary | ICD-10-CM | POA: Diagnosis present

## 2022-05-25 DIAGNOSIS — Z1151 Encounter for screening for human papillomavirus (HPV): Secondary | ICD-10-CM | POA: Insufficient documentation

## 2022-05-25 DIAGNOSIS — M0579 Rheumatoid arthritis with rheumatoid factor of multiple sites without organ or systems involvement: Secondary | ICD-10-CM | POA: Diagnosis not present

## 2022-05-25 MED ORDER — NYSTATIN-TRIAMCINOLONE 100000-0.1 UNIT/GM-% EX OINT: 1.0000 | TOPICAL_OINTMENT | Freq: Two times a day (BID) | CUTANEOUS | 3 refills | Status: AC

## 2022-05-25 MED ORDER — CERTOLIZUMAB PEGOL 2 X 200 MG ~~LOC~~ KIT
400.0000 mg | PACK | Freq: Once | SUBCUTANEOUS | Status: AC
  Administered 2022-05-25: 400 mg via SUBCUTANEOUS

## 2022-05-25 NOTE — Progress Notes (Signed)
Office Visit Note  Patient: Wendy Woods             Date of Birth: 27-May-1960           MRN: [id number]             PCP: Fleet Contras, MD Referring: Fleet Contras, MD Visit Date: 05/26/2022 Occupation: @GUAROCC @  Subjective:  Right knee joint pain and swelling   History of Present Illness: Wendy Woods is a 62 y.o. female with history of seropositive rheumatoid arthritis, scleritis, and DDD. She is currently on Cimzia 400 mg sq injections every month-in-office administration.  She was started on cimzia on 02/27/22.  Her most recent dose was administered yesterday.  She continues to tolerate Cimzia without any side effects or injection site reactions.  She remains on Arava 20 mg daily.  Her arava was added at the beginning of early September 2023. Patient reports that 3 days ago she started to experience a flare in the right knee.  She is currently having warmth and swelling in the right knee.  She is having difficulty ambulating due to the severity of pain.  She is also having a recurrence of pain and swelling in both wrist joints.  She has been taking Tylenol as needed for pain relief.  She was unable to take prednisone or have cortisone injections due to history of uncontrolled type 2 diabetes.  Activities of Daily Living:  Patient reports morning stiffness  Patient Reports nocturnal pain.  Difficulty dressing/grooming: Denies Difficulty climbing stairs: Reports Difficulty getting out of chair: Reports Difficulty using hands for taps, buttons, cutlery, and/or writing: Reports  Review of Systems  HENT:  Negative for mouth sores and mouth dryness.   Eyes:  Negative for dryness.  Respiratory:  Negative for shortness of breath.   Cardiovascular:  Negative for chest pain and palpitations.  Gastrointestinal:  Negative for blood in stool, constipation and diarrhea.  Endocrine: Negative for increased urination.  Genitourinary:  Negative for involuntary urination.  Musculoskeletal:   Positive for joint pain, joint pain and joint swelling. Negative for gait problem, myalgias, muscle weakness, morning stiffness, muscle tenderness and myalgias.  Skin:  Negative for color change, rash, hair loss and sensitivity to sunlight.  Allergic/Immunologic: Negative for susceptible to infections.  Neurological:  Negative for dizziness and headaches.  Hematological:  Negative for swollen glands.  Psychiatric/Behavioral:  Negative for depressed mood and sleep disturbance. The patient is not nervous/anxious.     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    Past Medical History:  Diagnosis Date   Asthma    as a child   DDD (degenerative disc disease), cervical 03/22/2016   DDD (degenerative disc disease), lumbar 03/22/2016   GERD (gastroesophageal reflux disease)    Hyperlipidemia    Hypertension    Rheumatoid arthritis ([facility])    Rheumatoid arthritis(714.0)    Scleritis 03/22/2016   Seasonal allergies    Type II diabetes mellitus ([facility])  Family History  Problem Relation Age of Onset   Diabetes Paternal Grandfather    Heart failure Paternal Grandfather    Hypertension Paternal Grandmother    Stroke Paternal Grandmother    Heart attack Maternal Grandmother    Tuberculosis Maternal Grandfather    Colon polyps Father    Prostate cancer Father    Hypertension Mother    Anxiety disorder Mother    Breast cancer Cousin 3   Colon  cancer Neg Hx    Past Surgical History:  Procedure Laterality Date   APPENDECTOMY  1968   CATARACT EXTRACTION W/ INTRAOCULAR LENS  IMPLANT, BILATERAL Bilateral 11/2013-12/2013   KNEE ARTHROPLASTY     KNEE ARTHROSCOPY Left 1980's-1990's X 3   MYOMECTOMY  1990's   NASOPHARYNGEAL BIOPSY N/A 11/16/2021   Procedure: NASOPHARYNGEAL BIOPSY;  Surgeon: Melida Quitter, MD;  Location: [facility] OR;  Service: ENT;  Laterality: N/A;   Social History   Social History Narrative   Right Handed   Lives in a two story home    Immunization History  Administered Date(s) Administered   PFIZER(Purple Top)SARS-COV-2 Vaccination 04/23/2019, 05/12/2019, 01/28/2020     Objective: Vital Signs: BP (!) 142/85 (BP Location: Left Arm, Patient Position: Sitting, Cuff Size: Normal)   Pulse 83   Ht 5\' 4"  (1.626 m)   Wt 135 lb (61.2 kg)   LMP 07/09/2014   BMI 23.17 kg/m    Physical Exam Vitals and nursing note reviewed.  Constitutional:      Appearance: She is well-developed.  HENT:     Head: Normocephalic and atraumatic.  Eyes:     Conjunctiva/sclera: Conjunctivae normal.  Cardiovascular:     Rate and Rhythm: Normal rate and regular rhythm.     Heart sounds: Normal heart sounds.  Pulmonary:     Effort: Pulmonary effort is normal.     Breath sounds: Normal breath sounds.  Abdominal:     General: Bowel sounds are normal.     Palpations: Abdomen is soft.  Musculoskeletal:     Cervical back: Normal range of motion.  Skin:    General: Skin is warm and dry.     Capillary Refill: Capillary refill takes less than 2 seconds.  Neurological:     Mental Status: She is alert and oriented to person, place, and time.  Psychiatric:        Behavior: Behavior normal.      Musculoskeletal Exam: C-spine, thoracic spine, and lumbar spine good ROM.  No midline spinal tenderness.  Shoulder joints and elbow joints have good ROM.  Extensor tenosynovitis of both wrists.  No tenderness or synovitis of MCP joints.  Complete  fist formation bilaterally.  Hip joints have good ROM with no groin pain.  Moderate effusion in right knee.  Left knee has good ROM with no warmth or effusion.  Ankle joints have good ROM with no tenderness or swelling.    CDAI Exam: CDAI Score: 7.2  Patient Global: 6 mm; Provider Global: 6 mm Swollen: 3 ; Tender: 3  Joint Exam 05/26/2022      Right  Left  Wrist  Swollen Tender  Swollen Tender  Knee  Swollen Tender        Investigation: No additional findings.  Imaging: MR Foot Left w/o contrast  Result Date: 05/15/2022 CLINICAL DATA:  CHRONIC FOOT PAIN EXAM: MRI OF THE LEFT FOOT WITHOUT CONTRAST TECHNIQUE: Multiplanar, multisequence MR imaging of the left hindfoot was performed. No intravenous contrast was administered. COMPARISON:  RADIOGRAPHS DATED April 05, 2022  FINDINGS: Bones: Focal marrow edema about the dorsal aspect of the navicular bone with mild surrounding soft tissue edema likely sequela of recent trauma/small avulsion fracture. TENDONS Peroneal: Peroneal longus tendon intact. Peroneal brevis intact. Posteromedial: Posterior tibial tendon intact. Flexor hallucis longus tendon intact. Flexor digitorum longus tendon intact. Anterior: Tibialis anterior tendon intact. Extensor hallucis longus tendon intact Extensor digitorum longus tendon intact. Achilles:  Intact. Plantar Fascia: Intact. LIGAMENTS Lateral: Anterior talofibular ligament intact. Calcaneofibular ligament intact. Posterior talofibular ligament intact. Anterior and posterior tibiofibular ligaments intact. Medial: Deltoid ligament intact. Spring ligament intact. CARTILAGE Ankle Joint: No joint effusion. Normal ankle mortise. No chondral defect. Subtalar Joints/Sinus Tarsi: Normal subtalar joints. No subtalar joint effusion. Normal sinus tarsi. Soft Tissue: No fluid collection or hematoma. Muscles are normal without edema or atrophy. Tarsal tunnel is normal. IMPRESSION: 1. Focal marrow edema about the dorsal aspect of the  navicular bone with mild surrounding soft tissue edema likely sequela of recent trauma/small avulsion fracture. 2. Ligaments and tendons are intact. No evidence of significant arthropathy. Electronically Signed   By: Larose Hires D.O.   On: 05/15/2022 22:43    Recent Labs: Lab Results  Component Value Date   WBC 6.4 03/28/2022   HGB 12.1 03/28/2022   PLT 360 03/28/2022   NA 136 03/28/2022   K 4.2 03/28/2022   CL 98 03/28/2022   CO2 30 03/28/2022   GLUCOSE 447 (H) 03/28/2022   BUN 16 03/28/2022   CREATININE 1.02 03/28/2022   BILITOT 0.3 03/28/2022   ALKPHOS 60 12/20/2021   AST 14 03/28/2022   ALT 17 03/28/2022   PROT 7.6 03/28/2022   ALBUMIN 3.5 12/20/2021   CALCIUM 9.6 03/28/2022   GFRAA >60 12/17/2019   QFTBGOLD Negative 09/18/2016   QFTBGOLDPLUS Negative 01/18/2022    Speciality Comments: Remicade 6mg / kg every 5 weeks-ACY 12/25/19 MTX -dcd 04/21 due to GI SEs  Procedures:  No procedures performed Allergies: Latex, Lisinopril, Penicillins, and Methotrexate derivatives     Assessment / Plan:     Visit Diagnoses: Rheumatoid arthritis involving multiple sites with positive rheumatoid factor (HCC) - + RF, Anti-CCP +, ANA negative: Patient presents today experiencing a flare involving multiple joints.  She has extensor tenosynovitis and bilateral wrist joints as well as a moderate effusion in the right knee joint.  Her flare started about 3 days ago.  She has been taking Tylenol for pain relief.  Her last in office administered Cimzia injection was administered yesterday and 05/25/2022.  She remains on Arava 20 mg daily.  Different treatment options were discussed today in detail.  She has not a good candidate for oral prednisone or intra-articular cortisone due to uncontrolled type 2 diabetes.  Advised patient to follow-up with endocrinology for better diabetes control in order for 05/27/2022 to better treat flares in the future.  Discussed that the goal will be to minimize her frequency and  severity of flares.  Plan to add on Plaquenil as triple therapy.  Indications, contraindications, and potential side effects of Plaquenil were discussed today in detail.  All questions were addressed and consent was obtained.  CBC and CMP were drawn today.  Her next lab work will be due in 1 month and every 3 months to monitor for drug toxicity.  She was advised to notify us if she cannot tolerate taking Plaquenil.  She will follow-up in the office in 6 weeks or sooner if needed.  Patient was counseled on the purpose, proper use, and adverse effects of hydroxychloroquine including  nausea/diarrhea, skin rash, headaches, and sun sensitivity.  Advised patient to wear sunscreen once starting hydroxychloroquine to reduce risk of rash associated with sun sensitivity.  Discussed importance of annual eye exams while on hydroxychloroquine to monitor to ocular toxicity and discussed importance of frequent laboratory monitoring.  Provided patient with eye exam form for baseline ophthalmologic exam.  Reviewed risk for QTC prolongation when used in combination with other QTc prolonging agents (including but not limited to antiarrhythmics, macrolide antibiotics, flouroquinolones, tricyclic antidepressants, citalopram, specific antipsychotics, ondansetron, migraine triptans, and methadone). Provided patient with educational materials on hydroxychloroquine and answered all questions.  Patient consented to hydroxychloroquine. Will upload consent in the media tab.    Dose will be Plaquenil 200 mg twice daily Monday through Friday.  Prescription pending lab results.  High risk medication use - In-office administered Cimzia injections-400 mg sq injections every 4 weeks.  Initiated cimzia on 02/27/22.  Most recent dose administered yesterday on 05/25/22.  She remains on arava 20 mg daily.   Adding on plaquenil 200 mg 1 tablet by mouth twice daily Monday through Friday.  Advised to schedule a baseline plaquenil eye exam.   Inadequate response to Remicade. Intolerance to MTX-oral and injectable.   CBC and CMP updated on 03/28/22.  Orders for CBC and CMP released today.  She will require updated lab work in 1 month then every 3 months. Standing orders for CBC and CMP remain in place.  TB gold negative 01/18/22. No recent or recurrent infections. Discussed the importance of holding Cimzia if she develops signs or symptoms of an infection and to resume once the infection has completely cleared.  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 She has not had any recent scleritis flares.   Primary osteoarthritis of both hands: Complete fist formation bilaterally.  Extensor tenosynovitis of both wrist joints.  No synovitis over MCP joints currently.  Trochanteric bursitis, right hip: No tenderness currently.   Primary osteoarthritis of both knees: Patient presents today with a right knee joint effusion which started 3 days ago.  No recent injury or fall.  She attributes the increased pain and swelling in the right knee due to gait changes while wearing a boot on her right foot for a healing fracture.  Different treatment options were discussed today.  Discussed that even if we aspirate knee effusion and will likely return we are unable to do a cortisone injection.  She is not a good candidate for oral or intra-articular cortisone injections due to uncontrolled type 2 diabetes. Plan to add plaquenil as triple therapy.  DDD (degenerative disc disease), cervical: Good ROM with no discomfort.    DDD (degenerative disc disease), lumbar: No midline spinal tenderness or symptoms of radiculopathy at this time.  Osteopenia of multiple sites - DEXA updated on 06/06/21: The BMD measured at Femur Neck Left is 0.843 g/cm2 with a T-score of -1.4.  History of vitamin D deficiency  Essential hypertension: BP was 142/85 today in the office.   History of diabetes  mellitus, type II: Advised to follow-up with her endocrinologist.   Anxiety  Orders: Orders Placed This Encounter  Procedures   CBC with Differential/Platelet   COMPLETE METABOLIC PANEL WITH GFR   Meds ordered this encounter  Medications   hydroxychloroquine (PLAQUENIL) 200 MG tablet    Sig: Take 200mg  by mouth twice daily, Monday through Friday only. None on Saturday or Sunday.    Dispense:  40 tablet  Refill:  2     Follow-Up Instructions: Return in about 2 months (around 07/25/2022) for Rheumatoid arthritis.   Gearldine Bienenstock, PA-C  Note - This record has been created using Dragon software.  Chart creation errors have been sought, but may not always  have been located. Such creation errors do not reflect on  the standard of medical care.

## 2022-05-25 NOTE — Progress Notes (Signed)
Subjective:   Patient presents to clinic today to receive monthly dose of Cimzia.  Patient running a fever or have signs/symptoms of infection? No  Patient currently on antibiotics for the treatment of infection? No  Patient have any upcoming invasive procedures/surgeries? No  Objective: CMP     Component Value Date/Time   NA 136 03/28/2022 1410   K 4.2 03/28/2022 1410   CL 98 03/28/2022 1410   CO2 30 03/28/2022 1410   GLUCOSE 447 (H) 03/28/2022 1410   BUN 16 03/28/2022 1410   CREATININE 1.02 03/28/2022 1410   CALCIUM 9.6 03/28/2022 1410   PROT 7.6 03/28/2022 1410   ALBUMIN 3.5 12/20/2021 0824   AST 14 03/28/2022 1410   ALT 17 03/28/2022 1410   ALKPHOS 60 12/20/2021 0824   BILITOT 0.3 03/28/2022 1410   GFRNONAA >60 12/20/2021 0824   GFRAA >60 12/17/2019 0913    CBC    Component Value Date/Time   WBC 6.4 03/28/2022 1410   RBC 4.04 03/28/2022 1410   HGB 12.1 03/28/2022 1410   HCT 36.2 03/28/2022 1410   PLT 360 03/28/2022 1410   MCV 89.6 03/28/2022 1410   MCH 30.0 03/28/2022 1410   MCHC 33.4 03/28/2022 1410   RDW 12.1 03/28/2022 1410   LYMPHSABS 2,720 03/28/2022 1410   MONOABS 0.5 12/20/2021 0824   EOSABS 179 03/28/2022 1410   BASOSABS 38 03/28/2022 1410    Baseline Immunosuppressant Therapy Labs TB GOLD    Latest Ref Rng & Units 01/18/2022    8:41 AM  Quantiferon TB Gold  Quantiferon TB Gold Plus Negative Negative    Hepatitis Panel   HIV No results found for: "HIV" Immunoglobulins   SPEP    Latest Ref Rng & Units 03/28/2022    2:10 PM  Serum Protein Electrophoresis  Total Protein 6.1 - 8.1 g/dL 7.6    G6PD No results found for: "G6PDH" TPMT No results found for: "TPMT"   Chest x-ray: 05/02/2010 No acute cardiopulmonary disease.  Scoliosis.     Assessment/Plan:   Patient tolerated injection well.   Appointment for next injection scheduled for 06/22/2022.  Patient due for labs in February 2024.  Patient is to call and reschedule appointment if  running a fever with signs/symptoms of infection, on antibiotics for active infection or has an upcoming invasive procedure.  All questions encouraged and answered.  Instructed patient to call with any further questions or concerns.

## 2022-05-25 NOTE — Progress Notes (Signed)
Subjective:     Wendy Woods is a 62 y.o. female postmenopausal with BMI 23 who is here for a comprehensive physical exam. The patient reports no problems. Patient denies any episodes of postmenopausal vaginal bleeding. She is sexually active without dyspareunia. She denies any urinary incontinence and reports regular bowel movements. 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   Seasonal allergies    Type II diabetes mellitus (HCC)    Past Surgical History:  Procedure Laterality Date   APPENDECTOMY  1968   CATARACT EXTRACTION W/ INTRAOCULAR LENS  IMPLANT, BILATERAL Bilateral 11/2013-12/2013   KNEE ARTHROPLASTY     KNEE ARTHROSCOPY Left 1980's-1990's X 3   MYOMECTOMY  1990's   NASOPHARYNGEAL BIOPSY N/A 11/16/2021   Procedure: NASOPHARYNGEAL BIOPSY;  Surgeon: Melida Quitter, MD;  Location: [facility] OR;  Service: ENT;  Laterality: N/A;   Family History  Problem Relation Age of Onset   Diabetes Paternal Grandfather    Heart failure Paternal Grandfather    Hypertension Paternal Grandmother    Stroke Paternal Grandmother    Heart attack Maternal Grandmother    Tuberculosis Maternal Grandfather    Colon polyps Father    Prostate cancer Father    Hypertension Mother    Anxiety disorder Mother    Breast cancer Cousin 40   Colon cancer Neg Hx    Social History   Socioeconomic History   Marital status: Divorced    Spouse name: Not on file   Number of children: 0   Years of education: Not on file   Highest education level: Not on file  Occupational History   Not on file  Tobacco Use   Smoking status: Never    Passive exposure: Never   Smokeless tobacco: Never   Vaping Use   Vaping Use: Never used  Substance and Sexual Activity   Alcohol use: No    Alcohol/week: 0.0 standard drinks of alcohol   Drug use: No   Sexual activity: Yes  Other Topics Concern   Not on file  Social History Narrative   Right Handed   Lives in a two story home    Social Determinants of Health   Financial Resource Strain: Not on file  Food Insecurity: Not on file  Transportation Needs: Not on file  Physical Activity: Not on file  Stress: Not on file  Social Connections: Not on file  Intimate Partner Violence: Not on file   Health Maintenance  Topic Date Due   Medicare Annual Wellness (AWV)  Never done   FOOT EXAM  Never done   OPHTHALMOLOGY EXAM  Never done   HIV Screening  Never done   Diabetic kidney evaluation - Urine ACR  Never done   Hepatitis C Screening  Never done   DTaP/Tdap/Td (1 - Tdap) Never done   Zoster Vaccines- Shingrix (1 of 2) Never done   PAP SMEAR-Modifier  Never done   HEMOGLOBIN A1C  01/04/2013   INFLUENZA VACCINE  Never done   COVID-19 Vaccine (4 - 2023-24 season) 12/30/2021   Diabetic kidney evaluation - eGFR measurement  03/29/2023   MAMMOGRAM  01/18/2024   COLONOSCOPY (Pts 45-59yrs Insurance coverage  will need to be confirmed)  06/28/2024   HPV VACCINES  Aged Out       Review of Systems Pertinent items noted in HPI and remainder of comprehensive ROS otherwise negative.   Objective:  Blood pressure 128/84, pulse 80, height 5\' 4"  (1.626 m), weight 136 lb 9.6 oz (62 kg), last menstrual period 07/09/2014.   GENERAL: Well-developed, well-nourished female in no acute distress.  HEENT: Normocephalic, atraumatic. Sclerae anicteric.  NECK: Supple. Normal thyroid.  LUNGS: Clear to auscultation bilaterally.  HEART: Regular rate and rhythm. BREASTS: Symmetric in size. No palpable masses or lymphadenopathy, skin changes, or nipple drainage. ABDOMEN: Soft, nontender, nondistended. No organomegaly. PELVIC: Normal external female  genitalia. Vagina is atrophic.  Normal discharge. Normal appearing cervix. Uterus is normal in size. No adnexal mass or tenderness. Chaperone present during the pelvic exam EXTREMITIES: No cyanosis, clubbing, or edema, 2+ distal pulses.     Assessment:    Healthy female exam.      Plan:    Pap smear collected Patient is current on colonoscopy and mammography Patient is followed by PCP Patient will be contacted with abnormal results See After Visit Summary for Counseling Recommendations

## 2022-05-25 NOTE — Progress Notes (Signed)
Annual exam Pap "long time ago" Reports external vaginal dryness and irritation

## 2022-05-26 ENCOUNTER — Ambulatory Visit: Payer: 59 | Attending: Physician Assistant | Admitting: Physician Assistant

## 2022-05-26 ENCOUNTER — Encounter: Payer: Self-pay | Admitting: Physician Assistant

## 2022-05-26 VITALS — BP 142/85 | HR 83 | Ht 64.0 in | Wt 135.0 lb

## 2022-05-26 DIAGNOSIS — M0579 Rheumatoid arthritis with rheumatoid factor of multiple sites without organ or systems involvement: Secondary | ICD-10-CM | POA: Diagnosis not present

## 2022-05-26 DIAGNOSIS — M7061 Trochanteric bursitis, right hip: Secondary | ICD-10-CM

## 2022-05-26 DIAGNOSIS — M19041 Primary osteoarthritis, right hand: Secondary | ICD-10-CM

## 2022-05-26 DIAGNOSIS — Z79899 Other long term (current) drug therapy: Secondary | ICD-10-CM

## 2022-05-26 DIAGNOSIS — M170 Bilateral primary osteoarthritis of knee: Secondary | ICD-10-CM

## 2022-05-26 DIAGNOSIS — I10 Essential (primary) hypertension: Secondary | ICD-10-CM

## 2022-05-26 DIAGNOSIS — H15003 Unspecified scleritis, bilateral: Secondary | ICD-10-CM

## 2022-05-26 DIAGNOSIS — M8589 Other specified disorders of bone density and structure, multiple sites: Secondary | ICD-10-CM

## 2022-05-26 DIAGNOSIS — M5030 Other cervical disc degeneration, unspecified cervical region: Secondary | ICD-10-CM

## 2022-05-26 DIAGNOSIS — F419 Anxiety disorder, unspecified: Secondary | ICD-10-CM

## 2022-05-26 DIAGNOSIS — Z8639 Personal history of other endocrine, nutritional and metabolic disease: Secondary | ICD-10-CM

## 2022-05-26 DIAGNOSIS — M5136 Other intervertebral disc degeneration, lumbar region: Secondary | ICD-10-CM

## 2022-05-26 DIAGNOSIS — M19042 Primary osteoarthritis, left hand: Secondary | ICD-10-CM

## 2022-05-26 MED ORDER — HYDROXYCHLOROQUINE SULFATE 200 MG PO TABS: ORAL_TABLET | ORAL | 2 refills | Status: DC

## 2022-05-26 NOTE — Patient Instructions (Signed)
Standing Labs We placed an order today for your standing lab work.   Please have your standing labs drawn in 1 month and then every 3 months.   Please have your labs drawn 2 weeks prior to your appointment so that the provider can discuss your lab results at your appointment.  Please note that you may see your imaging and lab results in [place] before we have reviewed them. We will contact you once all results are reviewed. Please allow our office up to 72 hours to thoroughly review all of the results before contacting the office for clarification of your results.  Lab hours are:   Monday through Thursday from 8:00 am -12:30 pm and 1:00 pm-5:00 pm and Friday from 8:00 am-12:00 pm.  Please be advised, all patients with office appointments requiring lab work will take precedent over walk-in lab work.   Labs are drawn by [facility]. Please bring your co-pay at the time of your lab draw.  You may receive a bill from [facility] for your lab work.  Please note if you are on Hydroxychloroquine and and an order has been placed for a Hydroxychloroquine level, you will need to have it drawn 4 hours or more after your last dose.  If you wish to have your labs drawn at another location, please call the office 24 hours in advance so we can fax the orders.  The office is located at [address] [place], [place], [place] [address] No appointment is necessary.    If you have any questions regarding directions or hours of operation,  please call [phone].   As a reminder, please drink plenty of water prior to coming for your lab work. Thanks!    Hydroxychloroquine Tablets What is this medication? HYDROXYCHLOROQUINE (hye drox ee KLOR oh kwin) treats autoimmune conditions, such as rheumatoid arthritis and lupus. It works by slowing down an overactive immune system. It may also be used to prevent and treat malaria. It works by killing the parasite that causes malaria. It belongs to a group of medications  called DMARDs. This medicine may be used for other purposes; ask your health care provider or pharmacist if you have questions. COMMON BRAND NAME(S): Plaquenil, Quineprox What should I tell my care team before I take this medication? They need to know if you have any of these conditions: Diabetes Eye disease, vision problems Frequently drink alcohol G6PD deficiency Heart disease Irregular heartbeat or rhythm Kidney disease Liver disease Porphyria Psoriasis An unusual or allergic reaction to hydroxychloroquine, other medications, foods, dyes, or preservatives Pregnant or trying to get pregnant Breastfeeding How should I use this medication? Take this medication by mouth with water. Take it as directed on the prescription label. Do not cut, crush, or chew this medication. Swallow the tablets whole. Take it with food. Do not take it more than directed. Take all of this medication unless your care team tells you to stop it early. Keep taking it even if you think you are better. Take products with antacids in them at a different time of day than this medication. Take this medication 4 hours before or 4 hours after antacids. Talk to your care team if you have questions. Talk to your care team about the use of this medication in children. While this medication may be prescribed for selected conditions, precautions do apply. Overdosage: If you think you have taken too much of this medicine contact a poison control center or emergency room at once. NOTE: This medicine is only  for you. Do not share this medicine with others. What if I miss a dose? If you miss a dose, take it as soon as you can. If it is almost time for your next dose, take only that dose. Do not take double or extra doses. What may interact with this medication? Do not take this medication with any of the following: Cisapride Dronedarone Pimozide Thioridazine This medication may also interact with the  following: Ampicillin Antacids Cimetidine Cyclosporine Digoxin Kaolin Medications for diabetes, such as insulin, glipizide, glyburide Medications for seizures, such as carbamazepine, phenobarbital, phenytoin Mefloquine Methotrexate Other medications that cause heart rhythm changes Praziquantel This list may not describe all possible interactions. Give your health care provider a list of all the medicines, herbs, non-prescription drugs, or dietary supplements you use. Also tell them if you smoke, drink alcohol, or use illegal drugs. Some items may interact with your medicine. What should I watch for while using this medication? Visit your care team for regular checks on your progress. Tell your care team if your symptoms do not start to get better or if they get worse. You may need blood work done while you are taking this medication. If you take other medications that can affect heart rhythm, you may need more testing. Talk to your care team if you have questions. Your vision may be tested before and during use of this medication. Tell your care team right away if you have any change in your eyesight. This medication may cause serious skin reactions. They can happen weeks to months after starting the medication. Contact your care team right away if you notice fevers or flu-like symptoms with a rash. The rash may be red or purple and then turn into blisters or peeling of the skin. Or, you might notice a red rash with swelling of the face, lips or lymph nodes in your neck or under your arms. If you or your family notice any changes in your behavior, such as new or worsening depression, thoughts of harming yourself, anxiety, or other unusual or disturbing thoughts, or memory loss, call your care team right away. What side effects may I notice from receiving this medication? Side effects that you should report to your care team as soon as possible: Allergic reactions--skin rash, itching, hives,  swelling of the face, lips, tongue, or throat Aplastic anemia--unusual weakness or fatigue, dizziness, headache, trouble breathing, increased bleeding or bruising Change in vision Heart rhythm changes--fast or irregular heartbeat, dizziness, feeling faint or lightheaded, chest pain, trouble breathing Infection--fever, chills, cough, or sore throat Low blood sugar (hypoglycemia)--tremors or shaking, anxiety, sweating, cold or clammy skin, confusion, dizziness, rapid heartbeat Muscle injury--unusual weakness or fatigue, muscle pain, dark yellow or Jeschke urine, decrease in amount of urine Pain, tingling, or numbness in the hands or feet Rash, fever, and swollen lymph nodes Redness, blistering, peeling, or loosening of the skin, including inside the mouth Thoughts of suicide or self-harm, worsening mood, or feelings of depression Unusual bruising or bleeding Side effects that usually do not require medical attention (report to your care team if they continue or are bothersome): Diarrhea Headache Nausea Stomach pain Vomiting This list may not describe all possible side effects. Call your doctor for medical advice about side effects. You may report side effects to FDA at 1-800-FDA-1088. Where should I keep my medication? Keep out of the reach of children and pets. Store at room temperature up to 30 degrees C (86 degrees F). Protect from light. Get rid of any  unused medication after the expiration date. To get rid of medications that are no longer needed or have expired: Take the medication to a medication take-back program. Check with your pharmacy or law enforcement to find a location. If you cannot return the medication, check the label or package insert to see if the medication should be thrown out in the garbage or flushed down the toilet. If you are not sure, ask your care team. If it is safe to put it in the trash, empty the medication out of the container. Mix the medication with cat litter,  dirt, coffee grounds, or other unwanted substance. Seal the mixture in a bag or container. Put it in the trash. NOTE: This sheet is a summary. It may not cover all possible information. If you have questions about this medicine, talk to your doctor, pharmacist, or health care provider.  2023 Elsevier/Gold Standard (2004-06-28 00:00:00)

## 2022-05-27 ENCOUNTER — Telehealth: Payer: Self-pay | Admitting: Internal Medicine

## 2022-05-27 LAB — COMPLETE METABOLIC PANEL WITH GFR
AG Ratio: 1 (calc) (ref 1.0–2.5)
ALT: 11 U/L (ref 6–29)
AST: 10 U/L (ref 10–35)
Albumin: 3.8 g/dL (ref 3.6–5.1)
Alkaline phosphatase (APISO): 59 U/L (ref 37–153)
BUN: 12 mg/dL (ref 7–25)
CO2: 29 mmol/L (ref 20–32)
Calcium: 9.9 mg/dL (ref 8.6–10.4)
Chloride: 93 mmol/L — ABNORMAL LOW (ref 98–110)
Creat: 0.93 mg/dL (ref 0.50–1.05)
Globulin: 3.8 g/dL (calc) — ABNORMAL HIGH (ref 1.9–3.7)
Glucose, Bld: 568 mg/dL (ref 65–99)
Potassium: 4.4 mmol/L (ref 3.5–5.3)
Sodium: 135 mmol/L (ref 135–146)
Total Bilirubin: 0.2 mg/dL (ref 0.2–1.2)
Total Protein: 7.6 g/dL (ref 6.1–8.1)
eGFR: 70 mL/min/{1.73_m2} (ref >=60)

## 2022-05-27 LAB — CBC WITH DIFFERENTIAL/PLATELET
Absolute Monocytes: 428 cells/uL (ref 200–950)
Basophils Absolute: 38 cells/uL (ref 0–200)
Basophils Relative: 0.6 %
Eosinophils Absolute: 221 cells/uL (ref 15–500)
Eosinophils Relative: 3.5 %
HCT: 37.1 % (ref 35.0–45.0)
Hemoglobin: 12.2 g/dL (ref 11.7–15.5)
Lymphs Abs: 2205 cells/uL (ref 850–3900)
MCH: 29.5 pg (ref 27.0–33.0)
MCHC: 32.9 g/dL (ref 32.0–36.0)
MCV: 89.8 fL (ref 80.0–100.0)
MPV: 9.4 fL (ref 7.5–12.5)
Monocytes Relative: 6.8 %
Neutro Abs: 3408 cells/uL (ref 1500–7800)
Neutrophils Relative %: 54.1 %
Platelets: 452 10*3/uL — ABNORMAL HIGH (ref 140–400)
RBC: 4.13 10*6/uL (ref 3.80–5.10)
RDW: 12.4 % (ref 11.0–15.0)
Total Lymphocyte: 35 %
WBC: 6.3 10*3/uL (ref 3.8–10.8)

## 2022-05-27 NOTE — Telephone Encounter (Signed)
I received a call from [facility] due to critical high value with blood glucose of 568. Her recent lab results have shown uncontrolled diabetes but most in the 300s range. her metabolic panel was otherwise normal. She is currently feeling okay besides her joint pain and swelling as discussed in the clinic. I recommended for her to make sure she maintains good hydration status. She states she has upcoming follow up with her PCP office scheduled for Monday.

## 2022-05-29 NOTE — Progress Notes (Signed)
Plt count is slightly elevated-likely due to inflammation. Rest of CBC WNL.   Reviewed telephone encounter from 05/27/22 documented by Dr. Benjamine Mola for the critically high blood glucose of 568.  Patient is scheduled to see PCP today.   Please call the patient to check on how she is feeling and make sure she plans on following up with her PCP today

## 2022-06-01 LAB — CYTOLOGY - PAP
Comment: NEGATIVE
Comment: NEGATIVE
Comment: NEGATIVE
Diagnosis: NEGATIVE
Diagnosis: REACTIVE
HPV 16: NEGATIVE
HPV 18 / 45: POSITIVE — AB
High risk HPV: POSITIVE — AB

## 2022-06-06 ENCOUNTER — Ambulatory Visit (INDEPENDENT_AMBULATORY_CARE_PROVIDER_SITE_OTHER): Payer: 59 | Admitting: Physician Assistant

## 2022-06-06 ENCOUNTER — Encounter: Payer: Self-pay | Admitting: Physician Assistant

## 2022-06-06 ENCOUNTER — Ambulatory Visit (INDEPENDENT_AMBULATORY_CARE_PROVIDER_SITE_OTHER): Payer: 59

## 2022-06-06 DIAGNOSIS — M25561 Pain in right knee: Secondary | ICD-10-CM | POA: Diagnosis not present

## 2022-06-06 DIAGNOSIS — M79672 Pain in left foot: Secondary | ICD-10-CM | POA: Diagnosis not present

## 2022-06-06 DIAGNOSIS — G8929 Other chronic pain: Secondary | ICD-10-CM

## 2022-06-06 MED ORDER — LIDOCAINE HCL 1 % IJ SOLN
3.0000 mL | INTRAMUSCULAR | Status: AC | PRN
  Administered 2022-06-06: 3 mL

## 2022-06-06 MED ORDER — METHYLPREDNISOLONE ACETATE 40 MG/ML IJ SUSP
60.0000 mg | INTRAMUSCULAR | Status: AC | PRN
  Administered 2022-06-06: 60 mg via INTRA_ARTICULAR

## 2022-06-06 MED ORDER — BUPIVACAINE HCL 0.25 % IJ SOLN
2.0000 mL | INTRAMUSCULAR | Status: AC | PRN
  Administered 2022-06-06: 2 mL via INTRA_ARTICULAR

## 2022-06-06 NOTE — Progress Notes (Addendum)
Office Visit Note   Patient: Wendy Woods           Date of Birth: [birth year]           MRN: [id number] Visit Date: 06/06/2022              Requested by: Nolene Ebbs, MD [id number] [place],  [place] [id number] PCP: Nolene Ebbs, MD  Chief Complaint  Patient presents with  . Left Foot - Follow-up  . Right Knee - Pain      HPI: Wendy Woods is a pleasant 62 year old woman who I have been following for a left foot injury.  She is about 6 weeks status post avulsion injury to the navicular.  She had continued pain and an MRI was ordered.  She is here to review that today.  She actually said her foot starting to feel better.  She is having right knee pain and swelling.  She normally has more difficulties with her left knee because of her arthritis.  She denies any injury.  Assessment & Plan: Visit Diagnoses:  1. Chronic pain of right knee   2. Pain in left foot     Plan: Left foot is improving.  Her MRI did not show any ligamentous injuries.  I reviewed this with her today.  It did show some bone marrow edema in the area of the avulsion fracture.  She also has arthritis in her right knee.  I did go forward with an aspiration today and aspirated 35 cc of blood-tinged fluid.  No evidence of any infective process.  Injected 60 cc of methylprednisolone.  Will follow-up in 1 month.  In the meantime we will refer her to physical therapy to work on her improving function in her knees as well as her left foo she does have a history of poorly controlled diabetes but has had steroid injections in the past and tolerated this okay.  She knows to monitor her blood sugars  Follow-Up Instructions: Return in about 1 month (around 07/05/2022).   Ortho Exam  Patient is alert, oriented, no adenopathy, well-dressed, normal affect, normal respiratory effort. Examination of her right knee she does have an effusion noted.  There is no redness no cellulitis mild warmth.  Compartments are soft and  nontender.  She is globally tender in the medial lateral compartments. Left foot still some tenderness over the dorsum of the foot.  No swelling neurovascular intact she is ambulating in a regular shoe  Imaging: XR KNEE 3 VIEW RIGHT  Result Date: 06/06/2022 2 view radiographs of the right knee were obtained today.  She has tricompartmental arthritis with periarticular osteophytes and sclerotic changes.  Very similar though not quite as advanced as her left knee.  No acute fractures  No images are attached to the encounter.  Labs: Lab Results  Component Value Date   HGBA1C 12.9 (H) 07/04/2012   REPTSTATUS 05/23/2017 FINAL 05/20/2017   CULT NO GROUP A STREP (S.PYOGENES) ISOLATED 05/20/2017     Lab Results  Component Value Date   ALBUMIN 3.5 12/20/2021   ALBUMIN 4.2 10/23/2021   ALBUMIN 3.5 10/11/2021    No results found for: "MG" No results found for: "VD25OH"  No results found for: "PREALBUMIN"    Latest Ref Rng & Units 05/26/2022   10:54 AM 03/28/2022    2:10 PM 02/10/2022   10:22 AM  CBC EXTENDED  WBC 3.8 - 10.8 Thousand/uL 6.3  6.4  8.2   RBC 3.80 - 5.10 Million/uL  4.13  4.04  4.38   Hemoglobin 11.7 - 15.5 g/dL 12.2  12.1  13.1   HCT 35.0 - 45.0 % 37.1  36.2  39.9   Platelets 140 - 400 Thousand/uL 452  360  393   NEUT# 1,500 - 7,800 cells/uL 3,408  3,091  4,994   Lymph# 850 - 3,900 cells/uL 2,205  2,720  2,485      There is no height or weight on file to calculate BMI.  Orders:  Orders Placed This Encounter  Procedures  . XR KNEE 3 VIEW RIGHT  . Ambulatory referral to Physical Therapy   No orders of the defined types were placed in this encounter.    Procedures: Large Joint Inj: R knee on 06/06/2022 11:00 AM Indications: pain, diagnostic evaluation and joint swelling Details: 25 G 1.5 in needle, superior approach  Arthrogram: No  Medications: 60 mg methylPREDNISolone acetate 40 MG/ML; 3 mL lidocaine 1 %; 2 mL bupivacaine 0.25 % Aspirate: 35 mL  blood-tinged Outcome: tolerated well, no immediate complications  After verbal consent was obtained the superior medial aspect of her patella was prepped with alcohol followed by Betadine.  3 cc of lidocaine plain was injected.  After adequate anesthesia an 18-gauge needle on a 30 cc syringe was placed.  35 cc of blood-tinged joint fluid was aspirated.  No purulence was noted.  60 cc of Depo-Medrol was injected.  Compression wrap applied she tolerated this well Procedure, treatment alternatives, risks and benefits explained, specific risks discussed. Consent was given by the patient.    Clinical Data: No additional findings.  ROS:  All other systems negative, except as noted in the HPI. Review of Systems  Objective: Vital Signs: LMP 07/09/2014   Specialty Comments:  No specialty comments available.  PMFS History: Patient Active Problem List   Diagnosis Date Noted  . Leg pain, bilateral 02/22/2021  . Low back pain 01/12/2021  . 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 Pain in left foot 12/15/2020  . Unilateral primary osteoarthritis, left knee 01/10/2018  . Chronic pain of left knee 01/10/2018  . 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 Angioedema 07/15/2014  . Hypokalemia 07/15/2014  . Diabetes mellitus, controlled ([place]) 07/03/2012  . HTN (hypertension) 07/03/2012  . Rheumatoid arthritis ([place]) 07/03/2012  . Current chronic use of systemic steroids 07/03/2012  . 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 Asthma    as a child  . DDD (degenerative disc disease), cervical 03/22/2016  . DDD (degenerative disc disease), lumbar 03/22/2016  . GERD (gastroesophageal reflux disease)   . Hyperlipidemia   . Hypertension   . Rheumatoid arthritis ([place])   . Rheumatoid arthritis(714.0)   . 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 Seasonal allergies   . Type II diabetes mellitus ([facility])     Family History  Problem Relation Age of Onset  . Diabetes Paternal Grandfather    . Heart failure Paternal Grandfather   . Hypertension Paternal Grandmother   . Stroke Paternal Grandmother   . Heart attack Maternal Grandmother   . Tuberculosis Maternal Grandfather   . Colon polyps Father   . Prostate cancer Father   . Hypertension Mother   . Anxiety disorder Mother   . Breast cancer Cousin 26  . Colon cancer Neg Hx     Past Surgical History:  Procedure Laterality Date  . APPENDECTOMY  1968  . CATARACT EXTRACTION W/ INTRAOCULAR LENS  IMPLANT, BILATERAL Bilateral 11/2013-12/2013  . KNEE ARTHROPLASTY    .  KNEE ARTHROSCOPY Left 1980's-1990's X 3  . MYOMECTOMY  1990's  . NASOPHARYNGEAL BIOPSY N/A 11/16/2021   Procedure: NASOPHARYNGEAL BIOPSY;  Surgeon: Melida Quitter, MD;  Location: [facility];  Service: ENT;  Laterality: N/A;   Social History   Occupational History  . Not on file  Tobacco Use  . Smoking status: Never    Passive exposure: Never  . Smokeless tobacco: Never  Vaping Use  . Vaping Use: Never used  Substance and Sexual Activity  . Alcohol use: No    Alcohol/week: 0.0 standard drinks of alcohol  . Drug use: No  . Sexual activity: Yes

## 2022-06-09 NOTE — Therapy (Incomplete)
OUTPATIENT PHYSICAL THERAPY LOWER EXTREMITY EVALUATION   Patient Name: Wendy Woods MRN: [id number] DOB:Nov 04, 1960, 62 y.o., female Today's Date: 06/09/2022  END OF SESSION:   Past Medical History:  Diagnosis Date   Asthma    as a child   DDD (degenerative disc disease), cervical 03/22/2016   DDD (degenerative disc disease), lumbar 03/22/2016   GERD (gastroesophageal reflux disease)    Hyperlipidemia    Hypertension    Rheumatoid arthritis ([place])    Rheumatoid arthritis(714.0)    Scleritis 03/22/2016   Seasonal allergies    Type II diabetes mellitus ([place])    Past Surgical History:  Procedure Laterality Date   APPENDECTOMY  1968   CATARACT EXTRACTION W/ INTRAOCULAR LENS  IMPLANT, BILATERAL Bilateral 11/2013-12/2013   KNEE ARTHROPLASTY     KNEE ARTHROSCOPY Left [id number] X 3   MYOMECTOMY  1990's   NASOPHARYNGEAL BIOPSY N/A 11/16/2021   Procedure: NASOPHARYNGEAL BIOPSY;  Surgeon: Melida Quitter, MD;  Location: [facility];  Service: ENT;  Laterality: N/A;   Patient Active Problem List   Diagnosis Date Noted   Leg pain, bilateral 02/22/2021   Low back pain 01/12/2021   Osteoarthritis cervical spine 01/12/2021   Pain in left foot 12/15/2020   Unilateral primary osteoarthritis, left knee 01/10/2018   Chronic pain of left knee 01/10/2018   Scleritis 03/22/2016   Angioedema 07/15/2014   Hypokalemia 07/15/2014   Diabetes mellitus, controlled ([place]) 07/03/2012   HTN (hypertension) 07/03/2012   Rheumatoid arthritis ([place]) 07/03/2012   Current chronic use of systemic steroids 07/03/2012   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  Rationale for Evaluation and Treatment: Rehabilitation  ONSET DATE: ***  SUBJECTIVE:   SUBJECTIVE  STATEMENT: ***  PERTINENT HISTORY: *** PAIN:  Are you having pain? Yes: NPRS scale: ***/10 Pain location: *** Pain description: *** Aggravating factors: *** Relieving factors: ***  PRECAUTIONS: {Therapy precautions:24002}  WEIGHT BEARING RESTRICTIONS: {Yes ***/No:24003}  FALLS:  Has patient fallen in last 6 months? {fallsyesno:27318}  LIVING ENVIRONMENT: Lives with: {[facility] lives with:25569::"lives with their family"} Lives in: {Lives in:25570} Stairs: {opstairs:27293} Has following equipment at home: {Assistive devices:23999}  OCCUPATION: ***  PLOF: {PLOF:24004}  PATIENT GOALS: ***   OBJECTIVE:   DIAGNOSTIC FINDINGS:  2 view radiographs of the right knee were obtained today.  She has  tricompartmental arthritis with periarticular osteophytes and sclerotic  changes.  Very similar though not quite as advanced as her left knee.  No  acute fractures   MRI Lt foot: IMPRESSION: 1. Focal marrow edema about the dorsal aspect of the navicular bone with mild surrounding soft tissue edema likely sequela of recent trauma/small avulsion fracture. 2. Ligaments and tendons are intact. No evidence of significant arthropathy.  PATIENT SURVEYS:  FOTO ***  COGNITION: Overall cognitive status: {cognition:24006}     SENSATION: {sensation:27233}  EDEMA:  {edema:24020}  MUSCLE LENGTH: Hamstrings: Right *** deg; Left *** deg Marcello Moores test: Right *** deg; Left *** deg  POSTURE: {posture:25561}  PALPATION: ***  LOWER EXTREMITY ROM:  Active ROM Right eval Left eval  Hip flexion    Hip extension    Hip abduction    Hip adduction    Hip internal rotation    Hip external rotation    Knee flexion  Knee extension    Ankle dorsiflexion    Ankle plantarflexion    Ankle inversion    Ankle eversion     (Blank rows = not tested)  LOWER EXTREMITY MMT:  MMT Right eval Left eval  Hip flexion    Hip extension    Hip abduction    Hip adduction    Hip internal  rotation    Hip external rotation    Knee flexion    Knee extension    Ankle dorsiflexion    Ankle plantarflexion    Ankle inversion    Ankle eversion     (Blank rows = not tested)  LOWER EXTREMITY SPECIAL TESTS:  {LEspecialtests:26242}  FUNCTIONAL TESTS:  {Functional tests:24029}  GAIT: Distance walked: *** Assistive device utilized: {Assistive devices:23999} Level of assistance: {Levels of assistance:24026} Comments: ***   [facility] Adult PT Treatment:                                                DATE: 06/12/22 Therapeutic Exercise: *** Manual Therapy: *** Neuromuscular re-ed: *** Therapeutic Activity: *** Modalities: *** Self Care: ***    PATIENT EDUCATION:  Education details: see treatment  Person educated: Patient Education method: Explanation, Demonstration, Tactile cues, Verbal cues, and Handouts Education comprehension: verbalized understanding, returned demonstration, verbal cues required, tactile cues required, and needs further education  HOME EXERCISE PROGRAM: ***  ASSESSMENT:  CLINICAL IMPRESSION: Patient is a 62 y.o. female who was seen today for physical therapy evaluation and treatment for chronic bilateral knee pain and acute Lt foot pain secondary to avulsion fracture of the navicular that she sustained from a fall on 04/05/22 .   OBJECTIVE IMPAIRMENTS: {opptimpairments:25111}.   ACTIVITY LIMITATIONS: {activitylimitations:27494}  PARTICIPATION LIMITATIONS: {participationrestrictions:25113}  PERSONAL FACTORS: {Personal factors:25162} are also affecting patient's functional outcome.   REHAB POTENTIAL: {rehabpotential:25112}  CLINICAL DECISION MAKING: {clinical decision making:25114}  EVALUATION COMPLEXITY: {Evaluation complexity:25115}   GOALS: Goals reviewed with patient? {yes/no:20286}  SHORT TERM GOALS: Target date: *** *** Baseline: Goal status: {GOALSTATUS:25110}  2.  *** Baseline:  Goal status: {GOALSTATUS:25110}  3.   *** Baseline:  Goal status: {GOALSTATUS:25110}  4.  *** Baseline:  Goal status: {GOALSTATUS:25110}  5.  *** Baseline:  Goal status: {GOALSTATUS:25110}  6.  *** Baseline:  Goal status: {GOALSTATUS:25110}  LONG TERM GOALS: Target date: ***  *** Baseline:  Goal status: {GOALSTATUS:25110}  2.  *** Baseline:  Goal status: {GOALSTATUS:25110}  3.  *** Baseline:  Goal status: {GOALSTATUS:25110}  4.  *** Baseline:  Goal status: {GOALSTATUS:25110}  5.  *** Baseline:  Goal status: {GOALSTATUS:25110}  6.  *** Baseline:  Goal status: {GOALSTATUS:25110}   PLAN:  PT FREQUENCY: {rehab frequency:25116}  PT DURATION: {rehab duration:25117}  PLANNED INTERVENTIONS: {rehab planned interventions:25118::"Therapeutic exercises","Therapeutic activity","Neuromuscular re-education","Balance training","Gait training","Patient/Family education","Self Care","Joint mobilization"}  PLAN FOR NEXT SESSION: ***  Gwendolyn Grant, PT, DPT, ATC 06/09/22 7:50 AM

## 2022-06-12 ENCOUNTER — Ambulatory Visit: Payer: 59

## 2022-06-20 NOTE — Therapy (Signed)
OUTPATIENT PHYSICAL THERAPY LOWER EXTREMITY EVALUATION   Patient Name: Wendy Woods MRN: [id number] DOB:Jul 29, 1960, 63 y.o., female Today's Date: 06/22/2022  END OF SESSION:  PT End of Session - 06/21/22 1354     Visit Number 1    Number of Visits 17    Date for PT Re-Evaluation 08/19/22    Authorization Type MCR/MCD    Progress Note Due on Visit 10    PT Start Time 1400    PT Stop Time 1443    PT Time Calculation (min) 43 min    Activity Tolerance Patient tolerated treatment well    Behavior During Therapy [facility] for tasks assessed/performed             Past Medical History:  Diagnosis Date   Asthma    as a child   DDD (degenerative disc disease), cervical 03/22/2016   DDD (degenerative disc disease), lumbar 03/22/2016   GERD (gastroesophageal reflux disease)    Hyperlipidemia    Hypertension    Rheumatoid arthritis ([place])    Rheumatoid arthritis(714.0)    Scleritis 03/22/2016   Seasonal allergies    Type II diabetes mellitus ([place])    Past Surgical History:  Procedure Laterality Date   APPENDECTOMY  1968   CATARACT EXTRACTION W/ INTRAOCULAR LENS  IMPLANT, BILATERAL Bilateral 11/2013-12/2013   KNEE ARTHROPLASTY     KNEE ARTHROSCOPY Left 1980's-1990's X 3   MYOMECTOMY  1990's   NASOPHARYNGEAL BIOPSY N/A 11/16/2021   Procedure: NASOPHARYNGEAL BIOPSY;  Surgeon: Melida Quitter, MD;  Location: [facility];  Service: ENT;  Laterality: N/A;   Patient Active Problem List   Diagnosis Date Noted   Leg pain, bilateral 02/22/2021   Low back pain 01/12/2021   Osteoarthritis cervical spine 01/12/2021   Pain in left foot 12/15/2020   Unilateral primary osteoarthritis, left knee 01/10/2018   Chronic pain of left knee 01/10/2018   Scleritis 03/22/2016   Angioedema 07/15/2014   Hypokalemia 07/15/2014   Diabetes mellitus, controlled ([place]) 07/03/2012   HTN (hypertension) 07/03/2012   Rheumatoid arthritis ([place]) 07/03/2012   Current chronic use of systemic steroids 07/03/2012    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   SUBJECTIVE:   SUBJECTIVE STATEMENT: Patient reports chronic bilateral knee pain for years. She recently had fluid removed from the Rt knee. They have recently changed her medication for her rheumatoid arthritis and is still getting used to her new medication. She reports her knee pain has worsened over the past couple months. She reports flare ups of swelling related to her RA. She is also experiencing Lt foot pain following an avulsion fracture to the navicular she sustained from a fall in December 2023. She was in a walking boot for about 6 weeks following this injury. She feels overall her foot pain is improving, but continues to have pain with prolonged standing, walking and lifting activity.   PERTINENT HISTORY: Hypertension Rheumatoid arthritis  Diabetes  Knee arthroscopy Lt  PAIN:  Are you having pain? Yes: NPRS scale: 6 (  knees and Lt foot)/10 Pain location: bilateral knees and Lt foot Pain description: ache Aggravating factors: walking, standing, lifting, (foot and knees) sitting (knees) Relieving factors: elevation   PRECAUTIONS: Fall  WEIGHT BEARING RESTRICTIONS: No  FALLS:  Has patient fallen in last 6 months? Yes. Number of falls 1 tripped going down the stairs  LIVING ENVIRONMENT: Lives with: lives with their family Lives in: House/apartment Stairs: Yes: Internal: 13 steps; on right going up Has following equipment at home:  None  OCCUPATION: retired   PLOF: Independent  PATIENT GOALS: "to be able to strengthen my legs and I want to be able to stand for awhile."    OBJECTIVE:   DIAGNOSTIC FINDINGS:  2 view radiographs of the right knee were obtained today.  She has  tricompartmental arthritis with periarticular osteophytes and sclerotic  changes.  Very similar though not quite as advanced as her left knee.  No  acute fractures   MRI Lt foot: IMPRESSION: 1. Focal marrow edema about the dorsal aspect of the navicular bone with mild surrounding soft tissue edema likely sequela of recent trauma/small avulsion fracture. 2. Ligaments and tendons are intact. No evidence of significant arthropathy.  PATIENT SURVEYS:  FOTO 55% function to 62% predicted   COGNITION: Overall cognitive status: Within functional limits for tasks assessed     SENSATION: Not tested  EDEMA:  Unable to inspect knees due to clothing   MUSCLE LENGTH: Hamstrings: WNL bilaterally   POSTURE: rounded shoulders and forward head  PALPATION: TTP over dorsal aspect of navicular No palpable tenderness about bilateral knees   LOWER EXTREMITY ROM:  Active ROM Right eval Left eval  Hip flexion    Hip extension    Hip abduction    Hip adduction    Hip internal rotation    Hip external rotation    Knee flexion 104 pn 103 pn  Knee extension WNL WNL  Ankle dorsiflexion 13 12  Ankle plantarflexion WNL WNL  Ankle inversion WNL WNL  Ankle eversion WNL WNL   (Blank rows = not tested)  LOWER EXTREMITY MMT:  MMT Right eval Left eval  Hip flexion 4 4  Hip extension 3+ 3+  Hip abduction 3+ 3+  Hip adduction    Hip internal rotation    Hip external rotation    Knee flexion 4+ 4+  Knee extension 4- 4+  Ankle dorsiflexion 5 4 pn  Ankle plantarflexion Can complete SL calf raise Can complete SL calf raise pn!  Ankle inversion 5 4  Ankle eversion 5 4 pn   (Blank rows = not tested)  LOWER EXTREMITY SPECIAL TESTS:   N/A  FUNCTIONAL TESTS:  5 x STS: 27 seconds  SLS: 20 seconds bilateral   GAIT: Distance walked: 15 ft  Assistive device utilized: None Level of assistance: Complete Independence Comments: maintains supination during stance on the LLE, decreased push-off LLE   [facility] Adult PT Treatment:                                                DATE: 06/21/22 Therapeutic Exercise: Demonstrated and issued initial HEP.    Therapeutic Activity: Education on assessment findings that will be addressed throughout duration of POC.      PATIENT EDUCATION:  Education details: see treatment  Person educated: Patient Education method: Explanation, Demonstration, Tactile cues, Verbal cues, and Handouts Education comprehension:  verbalized understanding, returned demonstration, verbal cues required, tactile cues required, and needs further education  HOME EXERCISE PROGRAM: Access Code: [email or web address] URL: [email or web address] Date: 06/21/2022 Prepared by: Gwendolyn Grant  Exercises - Supine Quadriceps Stretch with Strap on Table  - 1 x daily - 7 x weekly - 3 sets - 30 sec  hold - Supine Bridge  - 1 x daily - 7 x weekly - 2 sets - 10 reps - Clamshell  - 1 x daily - 7 x weekly - 2 sets - 10 reps - Isometric Ankle Eversion at Wall  - 1 x daily - 7 x weekly - 2 sets - 10 reps - 5 sec  hold - Isometric Ankle Inversion  - 1 x daily - 7 x weekly - 2 sets - 10 reps - 5 sec  hold  ASSESSMENT:  CLINICAL IMPRESSION: Patient is a 62 y.o. female who was seen today for physical therapy evaluation and treatment for chronic bilateral knee pain secondary to rheumatoid arthritis and acute Lt foot pain secondary to avulsion fracture of the navicular that she sustained from a fall on 04/05/22. Upon assessment she is noted to have limited and painful bilateral knee flexion AROM, knee and hip weakness, pain and weakness with MMT of the Lt ankle, gait abnormalities, and activity limitations secondary to pain. She  will benefit from skilled PT to address the above stated deficits in order to optimize her function and assist in pain reduction.   OBJECTIVE IMPAIRMENTS: Abnormal gait, decreased activity tolerance, decreased knowledge of condition, difficulty walking, decreased ROM, decreased strength, impaired flexibility, improper body mechanics, postural dysfunction, and pain.   ACTIVITY LIMITATIONS: carrying, lifting, bending, sitting, standing, squatting, and locomotion level  PARTICIPATION LIMITATIONS: meal prep, cleaning, laundry, driving, shopping, and community activity  PERSONAL FACTORS: Age, Fitness, Time since onset of injury/illness/exacerbation, and 3+ comorbidities: see PMH above  are also affecting patient's functional outcome.   REHAB POTENTIAL: Good  CLINICAL DECISION MAKING: Evolving/moderate complexity  EVALUATION COMPLEXITY: Moderate   GOALS: Goals reviewed with patient? Yes  SHORT TERM GOALS: Target date: 07/20/2022   Patient will be independent and compliant with initial HEP.   Baseline: issued at eval  Goal status: INITIAL  2.  Patient will demonstrate at least 110 degrees of bilateral knee flexion AROM to improve ease of sit <> stand transfers.  Baseline: see above  Goal status: INITIAL  3.  Patient will complete 5 x STS in </= 18 seconds indicative of improved functional strength.  Baseline: see above  Goal status: INITIAL  4.  Patient will demonstrate pain free SL calf raise on the LLE to improve ability to push-off during stance phase of gait cycle on the LLE. Baseline: see above  Goal status: INITIAL   LONG TERM GOALS: Target date: 08/17/2022    Patient will demonstrate at least 4/5 bilateral hip abductor and extensor strength to improve stability about the chain with prolonged walking/standing activity.  Baseline: see above  Goal status: INITIAL  2.  Patient will tolerate at least 30 minutes of continuous standing activity with minimal pain to improve her  ability to shop.  Baseline: reports being limited with shopping at [employer] (can only make it through half of the store before having 9/10 pain)  Goal status: INITIAL  3.  Patient will demonstrate 5/5 bilateral knee strength to improve gait stability.  Baseline: see above  Goal status: INITIAL  4.  Patient will demonstrate 5/5 pain free Lt ankle strength to improve stability when navigating on  uneven terrain.  Baseline: see above  Goal status: INITIAL  5.  Patient will score at least 62% on FOTO to signify clinically meaningful improvement in functional abilities.   Baseline: see above Goal status: INITIAL  6.  Patient will be independent with advanced home program to progress/maintain current level of function.  Baseline: initial HEP issued  Goal status: INITIAL   PLAN:  PT FREQUENCY: 1-2x/week  PT DURATION: 8 weeks  PLANNED INTERVENTIONS: Therapeutic exercises, Therapeutic activity, Neuromuscular re-education, Balance training, Gait training, Patient/Family education, Self Care, Joint mobilization, Aquatic Therapy, Dry Needling, Electrical stimulation, Cryotherapy, Moist heat, Ionotophoresis 28m/ml Dexamethasone, Manual therapy, and Re-evaluation  PLAN FOR NEXT SESSION: review and progress HEP; hip/knee/ankle strength.   SGwendolyn Grant PT, DPT, ATC 06/22/22 9:54 AM

## 2022-06-21 ENCOUNTER — Ambulatory Visit: Payer: 59 | Attending: Internal Medicine

## 2022-06-21 ENCOUNTER — Other Ambulatory Visit: Payer: Self-pay

## 2022-06-21 DIAGNOSIS — M79672 Pain in left foot: Secondary | ICD-10-CM | POA: Diagnosis present

## 2022-06-21 DIAGNOSIS — M6281 Muscle weakness (generalized): Secondary | ICD-10-CM | POA: Diagnosis present

## 2022-06-21 DIAGNOSIS — R262 Difficulty in walking, not elsewhere classified: Secondary | ICD-10-CM | POA: Insufficient documentation

## 2022-06-21 DIAGNOSIS — G8929 Other chronic pain: Secondary | ICD-10-CM | POA: Diagnosis present

## 2022-06-21 DIAGNOSIS — M25561 Pain in right knee: Secondary | ICD-10-CM | POA: Diagnosis present

## 2022-06-21 DIAGNOSIS — M25562 Pain in left knee: Secondary | ICD-10-CM | POA: Diagnosis present

## 2022-06-27 ENCOUNTER — Telehealth: Payer: Self-pay | Admitting: *Deleted

## 2022-06-27 NOTE — Telephone Encounter (Signed)
Patient contacted the office and left message regarding scheduling her Cimzia injection.   Attempted to contact the patient and left message for patient to call the office. Will offer patient an appointment for 06/29/2022 at :30 pm.

## 2022-06-29 ENCOUNTER — Ambulatory Visit: Payer: 59 | Attending: Rheumatology | Admitting: *Deleted

## 2022-06-29 VITALS — BP 147/83 | HR 66

## 2022-06-29 DIAGNOSIS — M0579 Rheumatoid arthritis with rheumatoid factor of multiple sites without organ or systems involvement: Secondary | ICD-10-CM

## 2022-06-29 MED ORDER — CERTOLIZUMAB PEGOL 2 X 200 MG ~~LOC~~ KIT
400.0000 mg | PACK | Freq: Once | SUBCUTANEOUS | Status: AC
  Administered 2022-06-29: 400 mg via SUBCUTANEOUS

## 2022-06-29 NOTE — Progress Notes (Signed)
Subjective:   Patient presents to clinic today to receive monthly dose of Cimzia.  Patient running a fever or have signs/symptoms of infection? No  Patient currently on antibiotics for the treatment of infection? No  Patient have any upcoming invasive procedures/surgeries? No  Objective: CMP     Component Value Date/Time   NA 135 05/26/2022 1054   K 4.4 05/26/2022 1054   CL 93 (L) 05/26/2022 1054   CO2 29 05/26/2022 1054   GLUCOSE 568 (HH) 05/26/2022 1054   BUN 12 05/26/2022 1054   CREATININE 0.93 05/26/2022 1054   CALCIUM 9.9 05/26/2022 1054   PROT 7.6 05/26/2022 1054   ALBUMIN 3.5 12/20/2021 0824   AST 10 05/26/2022 1054   ALT 11 05/26/2022 1054   ALKPHOS 60 12/20/2021 0824   BILITOT 0.2 05/26/2022 1054   GFRNONAA >60 12/20/2021 0824   GFRAA >60 12/17/2019 0913    CBC    Component Value Date/Time   WBC 6.3 05/26/2022 1054   RBC 4.13 05/26/2022 1054   HGB 12.2 05/26/2022 1054   HCT 37.1 05/26/2022 1054   PLT 452 (H) 05/26/2022 1054   MCV 89.8 05/26/2022 1054   MCH 29.5 05/26/2022 1054   MCHC 32.9 05/26/2022 1054   RDW 12.4 05/26/2022 1054   LYMPHSABS 2,205 05/26/2022 1054   MONOABS 0.5 12/20/2021 0824   EOSABS 221 05/26/2022 1054   BASOSABS 38 05/26/2022 1054    Baseline Immunosuppressant Therapy Labs TB GOLD    Latest Ref Rng & Units 01/18/2022    8:41 AM  Quantiferon TB Gold  Quantiferon TB Gold Plus Negative Negative    Hepatitis Panel   HIV No results found for: "HIV" Immunoglobulins   SPEP    Latest Ref Rng & Units 05/26/2022   10:54 AM  Serum Protein Electrophoresis  Total Protein 6.1 - 8.1 g/dL 7.6    G6PD No results found for: "G6PDH" TPMT No results found for: "TPMT"   Chest x-ray:  05/02/2010 No acute cardiopulmonary disease.  Scoliosis.      Assessment/Plan:   Administrations This Visit     certolizumab pegol (CIMZIA) kit 400 mg     Admin Date 06/29/2022 Action Given Dose 400 mg Route Subcutaneous Administered By Carole Binning, LPN             Patient tolerated injection well.   Appointment for next injection scheduled for 07/27/2022.  Patient due for labs in April 2024.  Patient is to call and reschedule appointment if running a fever with signs/symptoms of infection, on antibiotics for active infection or has an upcoming invasive procedure.  All questions encouraged and answered.  Instructed patient to call with any further questions or concerns.

## 2022-07-03 ENCOUNTER — Ambulatory Visit: Payer: 59 | Attending: Internal Medicine | Admitting: Physical Therapy

## 2022-07-03 ENCOUNTER — Encounter: Payer: Self-pay | Admitting: Physical Therapy

## 2022-07-03 DIAGNOSIS — R262 Difficulty in walking, not elsewhere classified: Secondary | ICD-10-CM | POA: Insufficient documentation

## 2022-07-03 DIAGNOSIS — G8929 Other chronic pain: Secondary | ICD-10-CM | POA: Diagnosis present

## 2022-07-03 DIAGNOSIS — M6281 Muscle weakness (generalized): Secondary | ICD-10-CM | POA: Diagnosis present

## 2022-07-03 DIAGNOSIS — M25562 Pain in left knee: Secondary | ICD-10-CM | POA: Diagnosis present

## 2022-07-03 DIAGNOSIS — M79672 Pain in left foot: Secondary | ICD-10-CM

## 2022-07-03 DIAGNOSIS — M25561 Pain in right knee: Secondary | ICD-10-CM | POA: Insufficient documentation

## 2022-07-03 NOTE — Therapy (Signed)
OUTPATIENT PHYSICAL THERAPY TREATMENT NOTE   Patient Name: Wendy Woods MRN: [id number] DOB:29-Oct-1960, 62 y.o., female Today's Date: 07/03/2022  PCP: Nolene Ebbs, MD   REFERRING PROVIDER: Persons, Bevely Palmer, [place]  END OF SESSION:   PT End of Session - 07/03/22 1309     Visit Number 2    Number of Visits 17    Date for PT Re-Evaluation 08/19/22    Authorization Type MCR/MCD    PT Start Time 1310    PT Stop Time 1350    PT Time Calculation (min) 40 min             Past Medical History:  Diagnosis Date   Asthma    as a child   DDD (degenerative disc disease), cervical 03/22/2016   DDD (degenerative disc disease), lumbar 03/22/2016   GERD (gastroesophageal reflux disease)    Hyperlipidemia    Hypertension    Rheumatoid arthritis ([place])    Rheumatoid arthritis(714.0)    Scleritis 03/22/2016   Seasonal allergies    Type II diabetes mellitus ([place])    Past Surgical History:  Procedure Laterality Date   APPENDECTOMY  1968   CATARACT EXTRACTION W/ INTRAOCULAR LENS  IMPLANT, BILATERAL Bilateral 11/2013-12/2013   KNEE ARTHROPLASTY     KNEE ARTHROSCOPY Left 1980's-1990's X 3   MYOMECTOMY  1990's   NASOPHARYNGEAL BIOPSY N/A 11/16/2021   Procedure: NASOPHARYNGEAL BIOPSY;  Surgeon: Melida Quitter, MD;  Location: [facility] OR;  Service: ENT;  Laterality: N/A;   Patient Active Problem List   Diagnosis Date Noted   Leg pain, bilateral 02/22/2021   Low back pain 01/12/2021   Osteoarthritis cervical spine 01/12/2021   Pain in left foot 12/15/2020   Unilateral primary osteoarthritis, left knee 01/10/2018   Chronic pain of left knee 01/10/2018   Scleritis 03/22/2016   Angioedema 07/15/2014   Hypokalemia 07/15/2014   Diabetes mellitus, controlled ([place]) 07/03/2012   HTN (hypertension) 07/03/2012   Rheumatoid arthritis ([place]) 07/03/2012   Current chronic use of systemic steroids 07/03/2012   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 My knees, foot and wrists.    PAIN:  Are you having pain? Yes: NPRS scale: 8 (knees and Lt foot)/10 Pain location: bilateral knees and Lt foot Pain description: ache Aggravating factors: walking, standing, lifting, (foot and knees) sitting (knees) Relieving factors: elevation    OBJECTIVE: (objective measures completed at initial evaluation unless otherwise dated)   DIAGNOSTIC FINDINGS:  2 view radiographs of the right knee were obtained today.  She has  tricompartmental arthritis with periarticular osteophytes and sclerotic  changes.  Very similar though not quite as advanced as her left knee.  No  acute fractures    MRI Lt foot: IMPRESSION: 1. Focal marrow edema about the dorsal aspect of the navicular bone with mild surrounding soft tissue edema likely sequela of recent trauma/small avulsion fracture. 2. Ligaments and tendons are intact. No evidence of significant arthropathy.   PATIENT SURVEYS:  FOTO 55% function to 62% predicted    COGNITION: Overall cognitive status: Within functional limits for tasks assessed                         SENSATION: Not tested   EDEMA:  Unable to inspect knees due to clothing    MUSCLE LENGTH: Hamstrings: WNL bilaterally    POSTURE: rounded shoulders and forward head   PALPATION: TTP over dorsal aspect of navicular No  palpable tenderness about bilateral knees    LOWER EXTREMITY ROM:   Active ROM Right eval Left eval  Hip flexion      Hip extension      Hip abduction      Hip adduction      Hip internal rotation      Hip external rotation      Knee flexion 104 pn 103 pn  Knee extension WNL WNL  Ankle dorsiflexion 13 12  Ankle plantarflexion WNL WNL  Ankle inversion WNL WNL  Ankle eversion WNL WNL   (Blank rows = not tested)   LOWER EXTREMITY MMT:   MMT Right eval Left eval  Hip flexion 4 4  Hip extension 3+ 3+  Hip abduction 3+ 3+  Hip adduction      Hip internal rotation      Hip external rotation      Knee flexion 4+ 4+  Knee extension 4- 4+  Ankle dorsiflexion 5 4 pn  Ankle plantarflexion Can complete SL calf raise Can complete SL calf raise pn!  Ankle inversion 5 4  Ankle eversion 5 4 pn   (Blank rows = not tested)   LOWER EXTREMITY SPECIAL TESTS:  N/A   FUNCTIONAL TESTS:  5 x STS: 27 seconds  SLS: 20 seconds bilateral    GAIT: Distance walked: 15 ft  Assistive device utilized: None Level of assistance: Complete Independence Comments: maintains supination during stance on the LLE, decreased push-off LLE   [facility] Adult PT Treatment:                                                DATE: 07/03/22 Therapeutic Exercise: Nustep L3 UE/LE x 5 min Bridge 10 x 2  Clam 10 x 2 each  Quad stretch with strap 3 x 30 sec each  Supine ankle inversion and eversion iso 10 x 2  Adductor ball squeeze x12 Supine march x 10- c/ right knee  pain SAQ bilat 10 x 2      [facility] Adult PT Treatment:                                                DATE: 06/21/22 Therapeutic Exercise: Demonstrated and issued initial HEP.      Therapeutic Activity: Education on assessment findings that will be addressed throughout duration of POC.          PATIENT EDUCATION:  Education details: see treatment  Person educated: Patient Education method: Explanation, Demonstration, Tactile cues, Verbal cues,  and Handouts Education comprehension: verbalized understanding, returned demonstration, verbal cues required, tactile cues required, and needs further education   HOME EXERCISE PROGRAM: Access Code: [email or web address] URL: [email or web address] Date: 06/21/2022 Prepared by: Gwendolyn Grant   Exercises - Supine Quadriceps Stretch with Strap on Table  - 1 x daily - 7 x weekly - 3 sets - 30 sec  hold - Supine Bridge  - 1 x daily - 7 x weekly - 2 sets - 10 reps - Clamshell  - 1 x daily - 7 x weekly - 2 sets - 10 reps - Isometric Ankle Eversion at Wall  - 1 x daily - 7 x weekly - 2 sets - 10 reps - 5 sec  hold - Isometric Ankle Inversion  - 1 x daily - 7 x weekly - 2 sets - 10 reps - 5 sec  hold   ASSESSMENT:   CLINICAL IMPRESSION: Patient is a 62 y.o. female who was seen today for physical therapy treatment for chronic bilateral knee pain secondary to rheumatoid arthritis and acute Lt foot pain secondary to avulsion fracture of the navicular that she sustained from a fall on 04/05/22. Today she reports pain is flared up. Reviewed her HEP and worked on gentle hip/knee ankle mobility. Min c/o right knee pain with supine marching and quad stretch,  otherwise she performed all therex without complaints. She will benefit from skilled PT to address the above stated deficits in order to optimize her function and assist in pain reduction.    OBJECTIVE IMPAIRMENTS: Abnormal gait, decreased activity tolerance, decreased knowledge of condition, difficulty walking, decreased ROM, decreased strength, impaired flexibility, improper body mechanics, postural dysfunction, and pain.    ACTIVITY LIMITATIONS: carrying, lifting, bending, sitting, standing, squatting, and locomotion level   PARTICIPATION LIMITATIONS: meal prep, cleaning, laundry, driving, shopping, and community activity   PERSONAL FACTORS: Age, Fitness, Time since onset of injury/illness/exacerbation, and 3+ comorbidities: see PMH above  are also  affecting patient's functional outcome.    REHAB POTENTIAL: Good   CLINICAL DECISION MAKING: Evolving/moderate complexity   EVALUATION COMPLEXITY: Moderate     GOALS: Goals reviewed with patient? Yes   SHORT TERM GOALS: Target date: 07/20/2022     Patient will be independent and compliant with initial HEP.    Baseline: issued at eval  Goal status: INITIAL   2.  Patient will demonstrate at least 110 degrees of bilateral knee flexion AROM to improve ease of sit <> stand transfers.  Baseline: see above  Goal status: INITIAL   3.  Patient will complete 5 x STS in </= 18 seconds indicative of improved functional strength.  Baseline: see above  Goal status: INITIAL   4.  Patient will demonstrate pain free SL calf raise on the LLE to improve ability to push-off during stance phase of gait  cycle on the LLE. Baseline: see above  Goal status: INITIAL     LONG TERM GOALS: Target date: 08/17/2022       Patient will demonstrate at least 4/5 bilateral hip abductor and extensor strength to improve stability about the chain with prolonged walking/standing activity.  Baseline: see above  Goal status: INITIAL   2.  Patient will tolerate at least 30 minutes of continuous standing activity with minimal pain to improve her ability to shop.  Baseline: reports being limited with shopping at [place] (can only make it through half of the store before having 9/10 pain)  Goal status: INITIAL   3.  Patient will demonstrate 5/5 bilateral knee strength to improve gait stability.  Baseline: see above  Goal status: INITIAL   4.  Patient will demonstrate 5/5 pain free Lt ankle strength to improve stability when navigating on uneven terrain.  Baseline: see above  Goal status: INITIAL   5.  Patient will score at least 62% on FOTO to signify clinically meaningful improvement in functional abilities.    Baseline: see above Goal status: INITIAL   6.  Patient will be independent with advanced home  program to progress/maintain current level of function.  Baseline: initial HEP issued  Goal status: INITIAL     PLAN:   PT FREQUENCY: 1-2x/week   PT DURATION: 8 weeks   PLANNED INTERVENTIONS: Therapeutic exercises, Therapeutic activity, Neuromuscular re-education, Balance training, Gait training, Patient/Family education, Self Care, Joint mobilization, Aquatic Therapy, Dry Needling, Electrical stimulation, Cryotherapy, Moist heat, Ionotophoresis '4mg'$ /ml Dexamethasone, Manual therapy, and Re-evaluation   PLAN FOR NEXT SESSION: review and progress HEP; hip/knee/ankle strength.    Hessie Diener, PTA 07/03/22 1:49 PM Phone: [phone] Fax: [phone]

## 2022-07-05 ENCOUNTER — Ambulatory Visit (INDEPENDENT_AMBULATORY_CARE_PROVIDER_SITE_OTHER): Payer: 59 | Admitting: Physician Assistant

## 2022-07-05 ENCOUNTER — Encounter: Payer: Self-pay | Admitting: Physician Assistant

## 2022-07-05 ENCOUNTER — Telehealth: Payer: Self-pay | Admitting: Radiology

## 2022-07-05 ENCOUNTER — Ambulatory Visit: Payer: Medicare Other | Admitting: Physician Assistant

## 2022-07-05 ENCOUNTER — Ambulatory Visit: Payer: 59

## 2022-07-05 DIAGNOSIS — R262 Difficulty in walking, not elsewhere classified: Secondary | ICD-10-CM

## 2022-07-05 DIAGNOSIS — M6281 Muscle weakness (generalized): Secondary | ICD-10-CM

## 2022-07-05 DIAGNOSIS — M79672 Pain in left foot: Secondary | ICD-10-CM

## 2022-07-05 DIAGNOSIS — M170 Bilateral primary osteoarthritis of knee: Secondary | ICD-10-CM

## 2022-07-05 DIAGNOSIS — G8929 Other chronic pain: Secondary | ICD-10-CM

## 2022-07-05 DIAGNOSIS — M25561 Pain in right knee: Secondary | ICD-10-CM | POA: Diagnosis not present

## 2022-07-05 NOTE — Therapy (Signed)
OUTPATIENT PHYSICAL THERAPY TREATMENT NOTE   Patient Name: Wendy Woods MRN: [id number] DOB:[birth year], 62 y.o., female Today's Date: 07/05/2022  PCP: Nolene Ebbs, MD   REFERRING PROVIDER: Persons, Bevely Palmer, [place]  END OF SESSION:   PT End of Session - 07/05/22 1219     Visit Number 3    Number of Visits 17    Date for PT Re-Evaluation 08/19/22    Authorization Type MCR/MCD    PT Start Time 1228    PT Stop Time 1311    PT Time Calculation (min) 43 min    Activity Tolerance Patient tolerated treatment well    Behavior During Therapy [facility] for tasks assessed/performed              Past Medical History:  Diagnosis Date   Asthma    as a child   DDD (degenerative disc disease), cervical 03/22/2016   DDD (degenerative disc disease), lumbar 03/22/2016   GERD (gastroesophageal reflux disease)    Hyperlipidemia    Hypertension    Rheumatoid arthritis ([place])    Rheumatoid arthritis(714.0)    Scleritis 03/22/2016   Seasonal allergies    Type II diabetes mellitus ([place])    Past Surgical History:  Procedure Laterality Date   APPENDECTOMY  1968   CATARACT EXTRACTION W/ INTRAOCULAR LENS  IMPLANT, BILATERAL Bilateral 11/2013-12/2013   KNEE ARTHROPLASTY     KNEE ARTHROSCOPY Left 1980's-1990's X 3   MYOMECTOMY  1990's   NASOPHARYNGEAL BIOPSY N/A 11/16/2021   Procedure: NASOPHARYNGEAL BIOPSY;  Surgeon: Melida Quitter, MD;  Location: [facility] OR;  Service: ENT;  Laterality: N/A;   Patient Active Problem List   Diagnosis Date Noted   Leg pain, bilateral 02/22/2021   Low back pain 01/12/2021   Osteoarthritis cervical spine 01/12/2021   Pain in left foot 12/15/2020   Osteoarthritis of knees, 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   Chronic pain of left knee 01/10/2018   Scleritis 03/22/2016   Angioedema 07/15/2014   Hypokalemia 07/15/2014   Diabetes mellitus, controlled ([place]) 07/03/2012   HTN (hypertension) 07/03/2012   Rheumatoid arthritis ([place]) 07/03/2012   Current chronic use of systemic steroids  07/03/2012   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 Maybe it's the weather." She had f/u with ortho today and is having gel injection ordered if insurance will authorize.    PAIN:  Are you having pain? Yes: NPRS scale: 8 (knees and Lt foot)/10 Pain location: bilateral knees and Lt foot Pain description: ache Aggravating factors: walking, standing, lifting, (foot and knees) sitting (knees) Relieving factors: elevation    OBJECTIVE: (objective measures completed at initial evaluation unless otherwise dated)   DIAGNOSTIC FINDINGS:  2 view radiographs of the right knee were obtained today.  She has  tricompartmental arthritis with periarticular osteophytes and sclerotic  changes.  Very similar though not quite as advanced as her left knee.  No  acute fractures    MRI Lt foot: IMPRESSION: 1. Focal marrow edema about the dorsal aspect of the navicular bone with mild surrounding soft tissue edema likely sequela of recent trauma/small avulsion fracture. 2. Ligaments and tendons are intact. No evidence of significant arthropathy.   PATIENT SURVEYS:  FOTO 55% function to 62% predicted    COGNITION: Overall cognitive status: Within functional limits for  tasks assessed                         SENSATION: Not tested   EDEMA:  Unable to inspect knees due to clothing    MUSCLE LENGTH: Hamstrings: WNL bilaterally    POSTURE: rounded shoulders and forward head   PALPATION: TTP over dorsal aspect of navicular No palpable tenderness about bilateral knees    LOWER EXTREMITY ROM:   Active ROM Right eval Left eval 07/05/22   Hip flexion       Hip extension       Hip abduction       Hip adduction       Hip internal rotation       Hip external rotation       Knee flexion 104 pn 103 pn Lt: 112; Rt 102 pn with both  Knee extension WNL WNL   Ankle dorsiflexion 13 12   Ankle plantarflexion WNL WNL   Ankle inversion WNL WNL   Ankle eversion WNL WNL    (Blank rows = not tested)   LOWER EXTREMITY MMT:   MMT Right eval Left eval  Hip flexion 4 4  Hip extension 3+ 3+  Hip abduction 3+ 3+  Hip adduction      Hip internal rotation      Hip external rotation      Knee flexion 4+ 4+  Knee extension 4- 4+  Ankle dorsiflexion 5 4 pn  Ankle plantarflexion Can complete SL calf raise Can complete SL calf raise pn!  Ankle inversion 5 4  Ankle eversion 5 4 pn   (Blank rows = not tested)   LOWER EXTREMITY SPECIAL TESTS:  N/A   FUNCTIONAL TESTS:  5 x STS: 27 seconds  SLS: 20 seconds bilateral    GAIT: Distance walked: 15 ft  Assistive device utilized: None Level of assistance: Complete Independence Comments: maintains supination during stance on the LLE, decreased push-off LLE  [facility] Adult PT Treatment:                                                DATE: 07/05/22 Therapeutic Exercise: NuStep level 4 x 5 minutes UE/LE  SLR 2 x 10  Sidelying  hip abduction 2 x 10  Resisted plantarflexion 2 x 10; red band  Resisted DF x 10; red band Seated toe raises 2 x 10  LAQ 2 x 10  Updated HEP  Manual Therapy: STM anterior tibialis, peroneals, extensor digitorum Passive ankle PF and DF ROM   [facility] Adult PT Treatment:                                                 DATE: 07/03/22 Therapeutic Exercise: Nustep L3 UE/LE x 5 min Bridge 10 x 2  Clam 10 x 2 each  Quad stretch with strap 3 x 30 sec each  Supine ankle inversion and eversion iso 10 x 2  Adductor ball squeeze x12 Supine march x 10- c/ right knee pain SAQ bilat 10 x 2        PATIENT EDUCATION:  Education details: see treatment  Person educated: Patient Education method: Explanation, Demonstration, Tactile cues, Verbal cues, and Handouts Education comprehension: verbalized understanding, returned demonstration, verbal cues required, tactile cues required, and needs further education   HOME EXERCISE PROGRAM: Access Code: [email or web address] URL: [email or web address] Date: 06/21/2022 Prepared by: Gwendolyn Grant   Exercises - Supine Quadriceps Stretch with Strap on Table  - 1 x daily - 7 x weekly - 3 sets - 30 sec  hold - Supine Bridge  - 1 x daily - 7 x weekly - 2 sets - 10 reps - Clamshell  - 1 x daily - 7 x weekly - 2 sets - 10 reps - Isometric Ankle Eversion at Wall  - 1 x daily - 7 x weekly - 2 sets - 10 reps - 5 sec  hold - Isometric Ankle Inversion  - 1 x daily - 7 x weekly - 2 sets - 10 reps - 5 sec  hold   ASSESSMENT:   CLINICAL IMPRESSION: Patient arrives with high pain levels about the knees and Lt foot, but in NAD. Her Lt knee flexion AROM has improved compared to baseline and slight reduction noted in Rt knee flexion AROM. Continued with BLE strengthening with good tolerance. Able to initiate resisted ankle strengthening with patient reporting an increase in foot pain with resisted dorsiflexion, but no increase in pain with resisted plantarflexion. HEP was updated to include further strengthening.    OBJECTIVE IMPAIRMENTS: Abnormal gait, decreased activity tolerance, decreased knowledge of condition, difficulty walking, decreased ROM, decreased strength, impaired flexibility, improper body mechanics, postural dysfunction, and pain.    ACTIVITY  LIMITATIONS: carrying, lifting, bending, sitting, standing, squatting, and locomotion level   PARTICIPATION LIMITATIONS: meal prep, cleaning, laundry, driving, shopping, and community activity   PERSONAL FACTORS: Age, Fitness, Time since onset of injury/illness/exacerbation, and 3+ comorbidities: see PMH above  are also affecting patient's functional outcome.    REHAB POTENTIAL: Good   CLINICAL DECISION MAKING: Evolving/moderate complexity   EVALUATION COMPLEXITY: Moderate     GOALS: Goals reviewed with patient? Yes   SHORT TERM GOALS: Target date: 07/20/2022     Patient will be independent and compliant with initial HEP.    Baseline: issued at eval  Goal status: INITIAL   2.  Patient will demonstrate at least 110 degrees of bilateral knee flexion AROM to improve ease of sit <> stand transfers.  Baseline: see above  Goal status: partially met    3.  Patient will complete 5 x STS in </=  18 seconds indicative of improved functional strength.  Baseline: see above  Goal status: INITIAL   4.  Patient will demonstrate pain free SL calf raise on the LLE to improve ability to push-off during stance phase of gait cycle on the LLE. Baseline: see above  Goal status: INITIAL     LONG TERM GOALS: Target date: 08/17/2022       Patient will demonstrate at least 4/5 bilateral hip abductor and extensor strength to improve stability about the chain with prolonged walking/standing activity.  Baseline: see above  Goal status: INITIAL   2.  Patient will tolerate at least 30 minutes of continuous standing activity with minimal pain to improve her ability to shop.  Baseline: reports being limited with shopping at [place] (can only make it through half of the store before having 9/10 pain)  Goal status: INITIAL   3.  Patient will demonstrate 5/5 bilateral knee strength to improve gait stability.  Baseline: see above  Goal status: INITIAL   4.  Patient will demonstrate 5/5 pain free Lt  ankle strength to improve stability when navigating on uneven terrain.  Baseline: see above  Goal status: INITIAL   5.  Patient will score at least 62% on FOTO to signify clinically meaningful improvement in functional abilities.    Baseline: see above Goal status: INITIAL   6.  Patient will be independent with advanced home program to progress/maintain current level of function.  Baseline: initial HEP issued  Goal status: INITIAL     PLAN:   PT FREQUENCY: 1-2x/week   PT DURATION: 8 weeks   PLANNED INTERVENTIONS: Therapeutic exercises, Therapeutic activity, Neuromuscular re-education, Balance training, Gait training, Patient/Family education, Self Care, Joint mobilization, Aquatic Therapy, Dry Needling, Electrical stimulation, Cryotherapy, Moist heat, Ionotophoresis '4mg'$ /ml Dexamethasone, Manual therapy, and Re-evaluation   PLAN FOR NEXT SESSION: review and progress HEP; hip/knee/ankle strength.  Gwendolyn Grant, PT, DPT, ATC 07/05/22 1:21 PM

## 2022-07-05 NOTE — Progress Notes (Signed)
Office Visit Note   Patient: Wendy Woods           Date of Birth: 10/20/1960           MRN: [id number] Visit Date: 07/05/2022              Requested by: Wendy Ebbs, MD [address] [place],  [place] [address] PCP: Wendy Ebbs, MD  Osteoarthritis bilateral knees    HPI: Wendy Woods is a pleasant 62 year old woman with a history of rheumatoid arthritis.  She also has osteoarthritis of both of her knees.  She was given a steroid injection a few weeks ago it did help significantly but did not last very long.  Denies any new injuries.  She has polyarthralgia but is going to see her rheumatologist at the end of the month  Assessment & Plan: Visit Diagnoses: Osteoarthritis bilateral knees  Plan: She did have some limited success with steroid.  She wanted to get another injection today I told her this would too close to the previous also she recently had a hemoglobin A1c which was 12.9.  This was excluded her from steroid injections.  I talked her about viscosupplementation and she think she like to go forward with that.  Will call her when it is authorized in the meantime she is going to continue to do physical therapy she is going there today most of her pain seems to be patellofemoral and I think it would be quite helpful to her This patient is diagnosed with osteoarthritis of the knee(s).    Radiographs show evidence of joint space narrowing, osteophytes, subchondral sclerosis and/or subchondral cysts.  This patient has knee pain which interferes with functional and activities of daily living.    This patient has experienced inadequate response, adverse effects and/or intolerance with conservative treatments such as acetaminophen, NSAIDS, topical creams, physical therapy or regular exercise, knee bracing and/or weight loss.   This patient has experienced inadequate response or has a contraindication to intra articular steroid injections for at least 3 months.   This patient is  not scheduled to have a total knee replacement within 6 months of starting treatment with viscosupplementation.   Follow-Up Instructions: Return for visco.   Ortho Exam  Patient is alert, oriented, no adenopathy, well-dressed, normal affect, normal respiratory effort. She has some mild soft tissue swelling in the right knee may be a small amount of fluid.  No redness no erythema her compartments are soft and nontender most of her pain is patellofemoral she does have grinding bilaterally with range of motion she is neurovascularly intact  Imaging: No results found. No images are attached to the encounter.  Labs: Lab Results  Component Value Date   HGBA1C 12.9 (H) 07/04/2012   REPTSTATUS 05/23/2017 FINAL 05/20/2017   CULT NO GROUP A STREP (S.PYOGENES) ISOLATED 05/20/2017     Lab Results  Component Value Date   ALBUMIN 3.5 12/20/2021   ALBUMIN 4.2 10/23/2021   ALBUMIN 3.5 10/11/2021    No results found for: "MG" No results found for: "VD25OH"  No results found for: "PREALBUMIN"    Latest Ref Rng & Units 05/26/2022   10:54 AM 03/28/2022    2:10 PM 02/10/2022   10:22 AM  CBC EXTENDED  WBC 3.8 - 10.8 Thousand/uL 6.3  6.4  8.2   RBC 3.80 - 5.10 Million/uL 4.13  4.04  4.38   Hemoglobin 11.7 - 15.5 g/dL 12.2  12.1  13.1   HCT 35.0 - 45.0 % 37.1  36.2  39.9   Platelets 140 - 400 Thousand/uL 452  360  393   NEUT# 1,500 - 7,800 cells/uL 3,408  3,091  4,994   Lymph# 850 - 3,900 cells/uL 2,205  2,720  2,485      There is no height or weight on file to calculate BMI.  Orders:  No orders of the defined types were placed in this encounter.  No orders of the defined types were placed in this encounter.    Procedures: No procedures performed  Clinical Data: No additional findings.  ROS:  All other systems negative, except as noted in the HPI. Review of Systems  Objective: Vital Signs: LMP 07/09/2014   Specialty Comments:  No specialty comments available.  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/03/2012   Past Medical History:  Diagnosis Date   Asthma    as a child   DDD (degenerative disc disease), cervical 03/22/2016   DDD (degenerative disc disease), lumbar 03/22/2016   GERD (gastroesophageal reflux disease)    Hyperlipidemia    Hypertension    Rheumatoid arthritis ([place])    Rheumatoid arthritis(714.0)    Scleritis 03/22/2016   Seasonal allergies    Type II diabetes mellitus ([place])     Family History  Problem Relation Age of Onset   Diabetes Paternal Grandfather    Heart failure Paternal Grandfather    Hypertension Paternal Grandmother    Stroke Paternal Grandmother    Heart attack Maternal Grandmother    Tuberculosis Maternal Grandfather    Colon polyps Father    Prostate cancer Father    Hypertension Mother    Anxiety disorder Mother    Breast cancer Cousin 26   Colon cancer Neg Hx     Past Surgical History:  Procedure Laterality Date   APPENDECTOMY  1968   CATARACT EXTRACTION W/ INTRAOCULAR LENS  IMPLANT, BILATERAL Bilateral 11/2013-12/2013   KNEE ARTHROPLASTY     KNEE ARTHROSCOPY Left 1980's-1990's X 3   MYOMECTOMY  1990's   NASOPHARYNGEAL BIOPSY N/A 11/16/2021   Procedure: NASOPHARYNGEAL BIOPSY;  Surgeon: Melida Quitter, MD;  [facility]: [facility];  Service: ENT;  Laterality: N/A;   Social History   Occupational History   Not on file  Tobacco Use   Smoking status: Never    Passive exposure: Never    Smokeless tobacco: Never  Vaping Use   Vaping Use: Never used  Substance and Sexual Activity   Alcohol use: No    Alcohol/week: 0.0 standard drinks of alcohol   Drug use: No   Sexual activity: Yes

## 2022-07-05 NOTE — Telephone Encounter (Signed)
VOB submitted for Durolane, bilateral knee.

## 2022-07-10 ENCOUNTER — Ambulatory Visit: Payer: 59

## 2022-07-10 DIAGNOSIS — R262 Difficulty in walking, not elsewhere classified: Secondary | ICD-10-CM

## 2022-07-10 DIAGNOSIS — G8929 Other chronic pain: Secondary | ICD-10-CM

## 2022-07-10 DIAGNOSIS — M79672 Pain in left foot: Secondary | ICD-10-CM

## 2022-07-10 DIAGNOSIS — M6281 Muscle weakness (generalized): Secondary | ICD-10-CM

## 2022-07-10 DIAGNOSIS — M25561 Pain in right knee: Secondary | ICD-10-CM | POA: Diagnosis not present

## 2022-07-10 NOTE — Therapy (Signed)
OUTPATIENT PHYSICAL THERAPY TREATMENT NOTE   Patient Name: Wendy Woods MRN: [id number] DOB:02-25-1961, 62 y.o., female Today's Date: 07/10/2022  PCP: Nolene Ebbs, MD   REFERRING PROVIDER: Persons, Bevely Palmer, [place]  END OF SESSION:   PT End of Session - 07/10/22 1139     Visit Number 4    Number of Visits 17    Date for PT Re-Evaluation 08/19/22    Authorization Type MCR/MCD    PT Start Time 1145    PT Stop Time 1226    PT Time Calculation (min) 41 min    Activity Tolerance Patient tolerated treatment well    Behavior During Therapy [facility] for tasks assessed/performed               Past Medical History:  Diagnosis Date   Asthma    as a child   DDD (degenerative disc disease), cervical 03/22/2016   DDD (degenerative disc disease), lumbar 03/22/2016   GERD (gastroesophageal reflux disease)    Hyperlipidemia    Hypertension    Rheumatoid arthritis ([place])    Rheumatoid arthritis(714.0)    Scleritis 03/22/2016   Seasonal allergies    Type II diabetes mellitus ([place])    Past Surgical History:  Procedure Laterality Date   APPENDECTOMY  1968   CATARACT EXTRACTION W/ INTRAOCULAR LENS  IMPLANT, BILATERAL Bilateral 11/2013-12/2013   KNEE ARTHROPLASTY     KNEE ARTHROSCOPY Left 1980's-1990's X 3   MYOMECTOMY  1990's   NASOPHARYNGEAL BIOPSY N/A 11/16/2021   Procedure: NASOPHARYNGEAL BIOPSY;  Surgeon: Melida Quitter, MD;  Location: [facility] OR;  Service: ENT;  Laterality: N/A;   Patient Active Problem List   Diagnosis Date Noted   Leg pain, bilateral 02/22/2021   Low back pain 01/12/2021   Osteoarthritis cervical spine 01/12/2021   Pain in left foot 12/15/2020   Osteoarthritis of knees, 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   Chronic pain of left knee 01/10/2018   Scleritis 03/22/2016   Angioedema 07/15/2014   Hypokalemia 07/15/2014   Diabetes mellitus, controlled ([place]) 07/03/2012   HTN (hypertension) 07/03/2012   Rheumatoid arthritis ([place]) 07/03/2012   Current chronic use of systemic  steroids 07/03/2012   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 Yes: NPRS scale: 8 (knees) 4 Lt (foot)/10 Pain location: bilateral knees and Lt foot Pain description: ache Aggravating factors: walking, standing, lifting, (foot and knees) sitting (knees) Relieving factors: elevation    OBJECTIVE: (objective measures completed at initial evaluation unless otherwise dated)   DIAGNOSTIC FINDINGS:  2 view radiographs of the right knee were obtained today.  She has  tricompartmental arthritis with periarticular osteophytes and sclerotic  changes.  Very similar though not quite as advanced as her left knee.  No  acute fractures    MRI Lt foot: IMPRESSION: 1. Focal marrow edema about the dorsal aspect of the navicular bone with mild surrounding soft tissue edema likely sequela of recent trauma/small avulsion fracture. 2. Ligaments and tendons are intact. No evidence of significant arthropathy.   PATIENT SURVEYS:  FOTO 55% function to 62% predicted    COGNITION: Overall cognitive status: Within functional limits for tasks assessed                         SENSATION: Not tested   EDEMA:  Unable to inspect knees  due to clothing    MUSCLE LENGTH: Hamstrings: WNL bilaterally    POSTURE: rounded shoulders and forward head   PALPATION: TTP over dorsal aspect of navicular No palpable tenderness about bilateral knees    LOWER EXTREMITY ROM:   Active ROM Right eval Left eval 07/05/22   Hip flexion       Hip extension       Hip abduction       Hip adduction       Hip internal rotation       Hip external rotation       Knee flexion 104 pn 103 pn Lt: 112; Rt 102 pn with both  Knee extension WNL WNL   Ankle dorsiflexion 13 12   Ankle plantarflexion WNL WNL   Ankle inversion WNL WNL   Ankle eversion WNL WNL    (Blank rows = not tested)   LOWER EXTREMITY MMT:   MMT Right eval Left eval  Hip flexion 4 4  Hip extension 3+ 3+  Hip abduction 3+ 3+  Hip adduction      Hip internal rotation      Hip external rotation      Knee flexion 4+ 4+  Knee extension 4- 4+  Ankle dorsiflexion 5 4 pn  Ankle plantarflexion Can complete SL calf raise Can complete SL calf raise pn!  Ankle inversion 5 4  Ankle eversion 5 4 pn   (Blank rows = not tested)   LOWER EXTREMITY SPECIAL TESTS:  N/A   FUNCTIONAL TESTS:  5 x STS: 27 seconds  SLS: 20 seconds bilateral    GAIT: Distance walked: 15 ft  Assistive device utilized: None Level of assistance: Complete Independence Comments: maintains supination during stance on the LLE, decreased push-off LLE [facility] Adult PT Treatment:                                                DATE: 07/10/22 Therapeutic Exercise: NuStep level 4 x 5 minutes  [place] pickups x 10  Seated rockerboard 2 x 10; A/P Standing calf raise 2 x 10  Standing toe raises 2 x 10  A/P  weight shifts x 10 each  Manual Therapy: STM anterior tibialis, peroneals, extensor digitorum Passive ankle PF and DF ROM Ankle mobilizations to improve dorsiflexion      [facility] Adult PT Treatment:                                                DATE: 07/05/22 Therapeutic Exercise: NuStep level 4 x 5  minutes UE/LE  SLR 2 x 10  Sidelying hip abduction 2 x 10  Resisted plantarflexion 2 x 10; red band  Resisted DF x 10; red band Seated toe raises 2 x 10  LAQ 2 x 10  Updated HEP  Manual Therapy: STM anterior tibialis, peroneals, extensor digitorum Passive ankle PF and DF ROM   [facility] Adult PT Treatment:                                                DATE: 07/03/22 Therapeutic Exercise: Nustep L3 UE/LE x 5 min Bridge 10 x 2  Clam 10 x 2 each  Quad stretch with strap 3 x 30 sec each  Supine ankle inversion and eversion iso 10 x 2  Adductor ball squeeze x12 Supine march x 10- c/ right knee pain SAQ bilat 10 x 2        PATIENT EDUCATION:  Education details: HEP review  Person educated: Patient Education method: Explanation Education comprehension: verbalized understanding   HOME EXERCISE PROGRAM: Access Code: [email or web address] URL: [email or web address] Date: 06/21/2022 Prepared by: Gwendolyn Grant   Exercises - Supine Quadriceps Stretch with Strap on Table  - 1 x daily - 7 x weekly - 3 sets - 30 sec  hold - Supine Bridge  - 1 x daily - 7 x weekly - 2 sets - 10 reps - Clamshell  - 1 x daily - 7 x weekly - 2 sets - 10 reps - Isometric Ankle Eversion at Wall  - 1 x daily - 7 x weekly - 2 sets - 10 reps - 5 sec  hold - Isometric Ankle Inversion  - 1 x daily - 7 x weekly - 2 sets - 10 reps - 5 sec  hold   ASSESSMENT:   CLINICAL IMPRESSION: Patient arrives with high pain levels about the knees and mild Lt foot pain. Able to initiate standing activity with patient tolerating progression of ankle strengthening and weight shifts fairly well reporting soreness about the midfoot after brief standing activity. She reported soreness in the foot at conclusion of session, but no increase in pain. No changes made to HEP at this time.    OBJECTIVE IMPAIRMENTS: Abnormal gait, decreased activity tolerance, decreased knowledge of condition, difficulty walking, decreased ROM, decreased  strength, impaired flexibility, improper body mechanics, postural dysfunction, and pain.    ACTIVITY LIMITATIONS: carrying, lifting, bending, sitting, standing, squatting, and locomotion level   PARTICIPATION LIMITATIONS: meal prep, cleaning, laundry, driving, shopping, and community activity   PERSONAL FACTORS: Age, Fitness, Time since onset of injury/illness/exacerbation, and 3+ comorbidities: see PMH above  are also affecting patient's functional outcome.    REHAB POTENTIAL: Good   CLINICAL DECISION MAKING: Evolving/moderate complexity   EVALUATION COMPLEXITY: Moderate     GOALS: Goals reviewed with patient? Yes   SHORT TERM GOALS: Target date:  07/20/2022     Patient will be independent and compliant with initial HEP.    Baseline: issued at eval  Goal status: met   2.  Patient will demonstrate at least 110 degrees of bilateral knee flexion AROM to improve ease of sit <> stand transfers.  Baseline: see above  Goal status: partially met    3.  Patient will complete 5 x STS in </= 18 seconds indicative of improved functional strength.  Baseline: see above  Goal status: INITIAL   4.  Patient will demonstrate pain free SL calf raise on the LLE to improve ability to push-off during stance phase of gait cycle on the LLE. Baseline: see above  Goal status: INITIAL     LONG TERM GOALS: Target date: 08/17/2022       Patient will demonstrate at least 4/5 bilateral hip abductor and extensor strength to improve stability about the chain with prolonged walking/standing activity.  Baseline: see above  Goal status: INITIAL   2.  Patient will tolerate at least 30 minutes of continuous standing activity with minimal pain to improve her ability to shop.  Baseline: reports being limited with shopping at [place] (can only make it through half of the store before having 9/10 pain)  Goal status: INITIAL   3.  Patient will demonstrate 5/5 bilateral knee strength to improve gait stability.   Baseline: see above  Goal status: INITIAL   4.  Patient will demonstrate 5/5 pain free Lt ankle strength to improve stability when navigating on uneven terrain.  Baseline: see above  Goal status: INITIAL   5.  Patient will score at least 62% on FOTO to signify clinically meaningful improvement in functional abilities.    Baseline: see above Goal status: INITIAL   6.  Patient will be independent with advanced home program to progress/maintain current level of function.  Baseline: initial HEP issued  Goal status: INITIAL     PLAN:   PT FREQUENCY: 1-2x/week   PT DURATION: 8 weeks   PLANNED INTERVENTIONS: Therapeutic exercises, Therapeutic activity, Neuromuscular re-education, Balance training, Gait training, Patient/Family education, Self Care, Joint mobilization, Aquatic Therapy, Dry Needling, Electrical stimulation, Cryotherapy, Moist heat, Ionotophoresis '4mg'$ /ml Dexamethasone, Manual therapy, and Re-evaluation   PLAN FOR NEXT SESSION: review and progress HEP; hip/knee/ankle strength.   Gwendolyn Grant, PT, DPT, ATC 07/10/22 12:27 PM

## 2022-07-12 ENCOUNTER — Ambulatory Visit: Payer: 59

## 2022-07-13 NOTE — Progress Notes (Unsigned)
Office Visit Note  Patient: Wendy Woods             Date of Birth: 1960/07/21           MRN: [id number]             PCP: Nolene Ebbs, MD Referring: Nolene Ebbs, MD Visit Date: 07/27/2022 Occupation: @GUAROCC @  Subjective:  Pain in multiple joints   History of Present Illness: ARELLI SCHRAUBEN is a 62 y.o. female with history of seropositive rheumatoid arthritis, scleritis, and DDD.  She remains on In-office Cimzia injections-400 mg sq injections every 4 weeks. Initiated cimzia on 02/27/22. She is taking Arava 20 mg daily and plaquenil 200 mg 1 tablet BID M-F.  Patient presents today for her 6th dose of cimzia, but she continues find Cimzia to be ineffective at managing her symptoms.  She continues to have ongoing pain and swelling in her left wrist, both hands, and both knee joints.  She had a right knee joint aspiration and cortisone injection on 06/06/22 which righted temporary relief but she continues to have warmth and swelling in the right knee.  She has difficulty ambulating due to severity of her symptoms.  She has been going to PT twice weekly.  She has not noticed any improvement since initiating Cimzia.  She has had significant difficulty performing ADLs due to the severity of her symptoms.  She is tolerating Plaquenil but is unsure if she is really noticed any improvement since adding on Plaquenil after her last office visit at the end of January 2024. She denies any recent or recurrent infections.        Activities of Daily Living:  Patient reports morning stiffness for 10 minutes  Patient Reports nocturnal pain.  Difficulty dressing/grooming: Denies Difficulty climbing stairs: Reports Difficulty getting out of chair: Reports Difficulty using hands for taps, buttons, cutlery, and/or writing: Reports  Review of Systems  Constitutional:  Positive for fatigue.  HENT:  Negative for mouth sores, mouth dryness and nose dryness.   Eyes:  Negative for pain, visual  disturbance and dryness.  Respiratory:  Negative for cough, hemoptysis, shortness of breath and difficulty breathing.   Cardiovascular:  Negative for chest pain, palpitations, hypertension and swelling in legs/feet.  Gastrointestinal:  Negative for blood in stool, constipation and diarrhea.  Endocrine: Negative for increased urination.  Genitourinary:  Negative for painful urination.  Musculoskeletal:  Positive for joint pain, joint pain, joint swelling and morning stiffness. Negative for myalgias, muscle weakness, muscle tenderness and myalgias.  Skin:  Negative for color change, pallor, rash, hair loss, nodules/bumps, skin tightness, ulcers and sensitivity to sunlight.  Neurological:  Negative for dizziness, numbness, headaches and weakness.  Hematological:  Negative for swollen glands.  Psychiatric/Behavioral:  Positive for sleep disturbance. Negative for depressed mood. The patient is not nervous/anxious.     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   Angioedema 07/15/2014   Hypokalemia 07/15/2014   Diabetes mellitus, controlled ([place]) 07/03/2012   HTN (hypertension) 07/03/2012   Rheumatoid arthritis ([place]) 07/03/2012   Current chronic use of systemic steroids 07/03/2012   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 07/03/2012    Past Medical History:  Diagnosis Date   Asthma    as a child   DDD (degenerative disc disease), cervical 03/22/2016   DDD (degenerative disc disease), lumbar 03/22/2016   GERD (gastroesophageal reflux disease)    Hyperlipidemia    Hypertension    Rheumatoid arthritis ([place])    Rheumatoid  arthritis(714.0)    Scleritis 03/22/2016   Seasonal allergies    Type II diabetes mellitus ([place])     Family History  Problem Relation Age of Onset   Diabetes Paternal Grandfather    Heart failure Paternal Grandfather    Hypertension Paternal Grandmother    Stroke Paternal Grandmother    Heart attack Maternal Grandmother    Tuberculosis Maternal Grandfather    Colon polyps Father    Prostate cancer Father    Hypertension Mother    Anxiety disorder Mother    Breast cancer Cousin 26   Colon cancer Neg Hx    Past Surgical History:  Procedure Laterality Date   APPENDECTOMY  1968   CATARACT EXTRACTION W/ INTRAOCULAR LENS  IMPLANT, BILATERAL Bilateral 11/2013-12/2013   KNEE ARTHROPLASTY     KNEE ARTHROSCOPY Left 1980's-1990's X 3   MYOMECTOMY  1990's   NASOPHARYNGEAL BIOPSY N/A 11/16/2021   Procedure: NASOPHARYNGEAL BIOPSY;  Surgeon: Melida Quitter, MD;  Location: [facility] OR;  Service: ENT;  Laterality: N/A;   Social History   Social History Narrative   Right Handed   Lives in a two story home    Immunization History  Administered Date(s) Administered   PFIZER(Purple Top)SARS-COV-2 Vaccination 04/23/2019, 05/12/2019, 01/28/2020     Objective: Vital Signs: BP 104/71 (BP Location: Left Arm, Patient Position: Sitting, Cuff Size: Normal)   Pulse 69   Resp 14   Ht 5\' 4"  (1.626 m)   Wt 143 lb (64.9 kg)   LMP 07/09/2014   BMI 24.55 kg/m    Physical Exam Vitals and nursing note reviewed.  Constitutional:      Appearance: She is well-developed.  HENT:     Head: Normocephalic and atraumatic.  Eyes:     Conjunctiva/sclera: Conjunctivae normal.  Cardiovascular:     Rate and Rhythm: Normal rate and regular rhythm.     Heart sounds: Normal heart sounds.  Pulmonary:     Effort: Pulmonary effort is normal.     Breath sounds: Normal breath sounds.  Abdominal:     General: Bowel sounds are normal.     Palpations: Abdomen is soft.  Musculoskeletal:     Cervical back: Normal range of  motion.  Skin:    General: Skin is warm and dry.     Capillary Refill: Capillary refill takes less than 2 seconds.  Neurological:     Mental Status: She is alert and oriented to person, place, and time.  Psychiatric:        Behavior: Behavior normal.      Musculoskeletal Exam: C-spine, thoracic spine, lumbar spine have good range of motion.  Shoulder joints and elbow joints have good range of motion.  Extensor tenosynovitis of the left wrist noted.  Tenderness and mild synovitis over the fourth PIP joints bilaterally.  Tenderness over bilateral second PIP joints.  Hip joints have good range of motion with no groin pain.  Warmth and small effusion in the right knee.  Left knee has painful range of motion with some warmth.  Ankle joints have good range of motion with no joint tenderness or synovitis today.  CDAI Exam: CDAI Score:  12.6  Patient Global: 8 mm; Provider Global: 8 mm Swollen: 4 ; Tender: 7  Joint Exam 07/27/2022      Right  Left  Wrist     Swollen Tender  PIP 2   Tender   Tender  PIP 4  Swollen Tender  Swollen Tender  Knee  Swollen Tender   Tender     Investigation: No additional findings.  Imaging: No results found.  Recent Labs: Lab Results  Component Value Date   WBC 6.3 05/26/2022   HGB 12.2 05/26/2022   PLT 452 (H) 05/26/2022   NA 135 05/26/2022   K 4.4 05/26/2022   CL 93 (L) 05/26/2022   CO2 29 05/26/2022   GLUCOSE 568 (HH) 05/26/2022   BUN 12 05/26/2022   CREATININE 0.93 05/26/2022   BILITOT 0.2 05/26/2022   ALKPHOS 60 12/20/2021   AST 10 05/26/2022   ALT 11 05/26/2022   PROT 7.6 05/26/2022   ALBUMIN 3.5 12/20/2021   CALCIUM 9.9 05/26/2022   GFRAA >60 12/17/2019   QFTBGOLD Negative 09/18/2016   QFTBGOLDPLUS Negative 01/18/2022    Speciality Comments: Remicade 6mg / kg every 5 weeks-ACY 12/25/19 MTX -dcd 04/21 due to GI SEs  Procedures:  No procedures performed Allergies: Latex, Lisinopril, Penicillins, and Methotrexate derivatives    Assessment / Plan:     Visit Diagnoses: Rheumatoid arthritis involving multiple sites with positive rheumatoid factor (HCC) - - + RF, Anti-CCP +, ANA negative: Patient presents today experiencing ongoing pain and inflammation involving multiple joints.  Her rheumatoid arthritis remains active despite being on triple therapy: Cimzia 400 mg subcu injections every 4 weeks, Plaquenil 200 mg 1 tablet by mouth twice daily Monday through Friday, and Arava 20 mg daily.  She initially started in office Cimzia on 02/27/2022 without any interruption.  Plaquenil was added at her last appointment on 05/26/2022.  She is due for her 6 dose of Cimzia today.  She has noticed very little to no improvement since switching from Remicade infusions to an office Cimzia.  She continues to have difficulty ambulating as well as difficulty performing ADLs due to the severity of her symptoms.  She has ongoing extensor tenosynovitis in the left wrist and has warmth and inflammation in both knees, right greater than left.  She has not a good candidate for oral prednisone or intra-articular cortisone injections given uncontrolled type 2 diabetes.  Different treatment options were discussed today.  Reviewed the indications, contraindications, and potential side effects of Rinvoq.  All questions were addressed and consent was obtained.  Patient was given a sample of rinvoq to initiate today.  We will apply for Rinvoq through her insurance.  For now she will remain on Plaquenil and Arava as combination therapy.  She will follow-up in the office in 6 to 8 weeks to assess her response.- Plan: Upadacitinib ER (RINVOQ) 15 MG TB24, Lipid panel  High risk medication use -Plan to apply for Rinvoq 15 mg 1 tablet by mouth daily.  A sample of Rinvoq was provided to the patient today. She will remain on Arava 20 mg daily and plaquenil 200 mg 1 tablet BID M-F  CBC and CMP updated on 05/26/22.  Orders for CBC and CMP were released today.  She will require  updated lab work in 1 month and every 3 months. Patient is currently fasting so we will check a lipid panel today. TB gold negative on 01/18/22.  Previous therapy includes prednisone, Arava, methotrexate-oral and injectable, Enbrel, Remicade, Plaquenil, and Cimzia. Her last  dose of Cimzia was administered on 06/29/2022--- inadequate response (initiated on 02/27/2022).  No recent or recurrent infections.  Discussed the importance of holding Rinvoq and Arava if she develops signs or symptoms of an infection and to resume once the infection is completely cleared.  Counseled patient that Rinvoq is a JAK inhibitor indicated for Rheumatoid Arthritis.  Counseled patient on purpose, proper use, and adverse effects of Rinvoq.    Reviewed the most common adverse effects including infection, diarrhea, headaches.  Also reviewed rare adverse effects such as bowel injury and the need to contact us if they develop stomach pain during treatment. Counseled on the increase risk of venous thrombosis. Counseled about FDA black box warning of MACE (major adverse CV events including cardiovascular death, myocardial infarction, and stroke).  Reviewed with patient that there is the possibility of an increased risk of malignancy specifically lung cancer and lymphomas but it is not well understood if this increased risk is due to the medication or the disease state. Instructed patient that medication should be held for infection and prior to surgery.  Advised patient to avoid live vaccines. Recommend annual influenza, PCV 15 or PCV20 or Pneumovax 23, and Shingrix as indicated.    Reviewed importance of routine lab monitoring including lipid panel.  Will recheck lipid panel 3 months after starting and annually thereafter. CBC and CMP will be monitored routinely every 3 months. Standing orders placed. Provided patient with medication education material and answered all questions.  Patient consented to Rinvoq.  Will upload into patient's  chart.  Will apply through patient's insurance and update when we receive a response.    Patient dose will be 15 mg daily.  Prescription will be sent to pharmacy pending lab results and insurance approval. - Plan: CBC with Differential/Platelet, COMPLETE METABOLIC PANEL WITH GFR, Upadacitinib ER (RINVOQ) 15 MG TB24, Lipid panel  Lipid screening -Patient is fasting today in the office.  Lipid panel was checked today.  Plan: Lipid panel  Bilateral scleritis - Dr. Leonie Man has not had any signs or symptoms of a scleritis flare.  No conjunctival injection noted today.  Primary osteoarthritis of both hands: She has noticed gradual improvement in the pain and inflammation in her hands.  She has tenderness over the second PIP and mild inflammation in the fourth PIP joints of both hands.  Trochanteric bursitis, right hip: Symptomatic intermittently.  Primary osteoarthritis of both knees: Patient presents today with severe pain and inflammation involving both knees.  She had a right knee joint aspiration and cortisone injection performed [facility]  She had temporary relief but her symptoms have returned.  She has ongoing pain and inflammation in both knees to the point she is having difficulty ambulating.  DDD (degenerative disc disease), cervical: C-spine has good range of motion with no discomfort currently.  DDD (degenerative disc disease), lumbar: She has been experiencing some increased discomfort in her lower back.  No symptoms of radiculopathy at this time.  Osteopenia of multiple sites - DEXA updated on 06/06/21: The BMD measured at Femur Neck Left is 0.843 g/cm2 with a T-score of -1.4.  Other medical conditions are listed as follows:  History of vitamin D deficiency  Essential hypertension: Blood pressure was 104/71 today in the office.  History of diabetes mellitus, type II: Not a good candidate for oral prednisone or intra-articular cortisone  injections.  Anxiety   Orders: Orders Placed This Encounter  Procedures   CBC with Differential/Platelet   COMPLETE METABOLIC PANEL  WITH GFR   Lipid panel   Meds ordered this encounter  Medications   Upadacitinib ER (RINVOQ) 15 MG TB24    Sig: Take 1 tablet (15 mg total) by mouth daily.    Dispense:  14 tablet    Refill:  0      Follow-Up Instructions: Return for Rheumatoid arthritis.   Ofilia Neas, PA-C  Note - This record has been created using Dragon software.  Chart creation errors have been sought, but may not always  have been located. Such creation errors do not reflect on  the standard of medical care.

## 2022-07-18 ENCOUNTER — Ambulatory Visit (INDEPENDENT_AMBULATORY_CARE_PROVIDER_SITE_OTHER): Payer: 59 | Admitting: Obstetrics and Gynecology

## 2022-07-18 ENCOUNTER — Other Ambulatory Visit (HOSPITAL_COMMUNITY)
Admission: RE | Admit: 2022-07-18 | Discharge: 2022-07-18 | Disposition: A | Discharge status: Home / Self Care | Payer: 59 | Source: Ambulatory Visit | Attending: Obstetrics and Gynecology | Admitting: Obstetrics and Gynecology

## 2022-07-18 ENCOUNTER — Encounter: Payer: Self-pay | Admitting: Obstetrics and Gynecology

## 2022-07-18 VITALS — BP 146/83 | HR 71 | Wt 148.0 lb

## 2022-07-18 DIAGNOSIS — R87810 Cervical high risk human papillomavirus (HPV) DNA test positive: Secondary | ICD-10-CM | POA: Diagnosis present

## 2022-07-18 NOTE — Progress Notes (Signed)
Pt is in office for colpo.

## 2022-07-18 NOTE — Progress Notes (Signed)
62 yo postmenopausal with abnormal pap smear here for colposcopy. Patient had positive HRHPV on 05/2022 pap smear. Patient reports feeling well and is without complaints  Past Medical History:  Diagnosis Date   Asthma    as a child   DDD (degenerative disc disease), cervical 03/22/2016   DDD (degenerative disc disease), lumbar 03/22/2016   GERD (gastroesophageal reflux disease)    Hyperlipidemia    Hypertension    Rheumatoid arthritis ([place])    Rheumatoid arthritis(714.0)    Scleritis 03/22/2016   Seasonal allergies    Type II diabetes mellitus ([place])    Past Surgical History:  Procedure Laterality Date   APPENDECTOMY  1968   CATARACT EXTRACTION W/ INTRAOCULAR LENS  IMPLANT, BILATERAL Bilateral 11/2013-12/2013   KNEE ARTHROPLASTY     KNEE ARTHROSCOPY Left 1980's-1990's X 3   MYOMECTOMY  1990's   NASOPHARYNGEAL BIOPSY N/A 11/16/2021   Procedure: NASOPHARYNGEAL BIOPSY;  Surgeon: Melida Quitter, MD;  Location: [facility] OR;  Service: ENT;  Laterality: N/A;   Family History  Problem Relation Age of Onset   Diabetes Paternal Grandfather    Heart failure Paternal Grandfather    Hypertension Paternal Grandmother    Stroke Paternal Grandmother    Heart attack Maternal Grandmother    Tuberculosis Maternal Grandfather    Colon polyps Father    Prostate cancer Father    Hypertension Mother    Anxiety disorder Mother    Breast cancer Cousin 23   Colon cancer Neg Hx    Social History   Tobacco Use   Smoking status: Never    Passive exposure: Never   Smokeless tobacco: Never  Vaping Use   Vaping Use: Never used  Substance Use Topics   Alcohol use: No    Alcohol/week: 0.0 standard drinks of alcohol   Drug use: No   ROS See pertinent in HPI. All other systems reviewed and non contributory Blood pressure (!) 146/83, pulse 71, weight 148 lb (67.1 kg), last menstrual period 07/09/2014. GENERAL: Well-developed, well-nourished female in no acute distress.  PELVIC: Normal external female  genitalia. Vagina is pink and rugated.  Normal discharge. Normal appearing cervix. Uterus is normal in size. No adnexal mass or tenderness. Chaperone present during the pelvic exam EXTREMITIES: No cyanosis, clubbing, or edema, 2+ distal pulses.  A/P 62 yo here for colposcopy due to positive HRHPV Patient given informed consent, signed copy in the chart, time out was performed.  Placed in lithotomy position. Cervix viewed with speculum and colposcope after application of acetic acid.   Colposcopy adequate?  yes [place]etowhite lesions?yes 6 o'clock Punctation?no Mosaicism?  no Abnormal vasculature?  no Biopsies?6 o'clock ECC?yes but scant tissue  COMMENTS: Patient was given post procedure instructions.  She will return in 2 weeks for results.  Mora Bellman, MD

## 2022-07-20 ENCOUNTER — Ambulatory Visit: Payer: 59 | Admitting: Physical Therapy

## 2022-07-20 DIAGNOSIS — M79672 Pain in left foot: Secondary | ICD-10-CM

## 2022-07-20 DIAGNOSIS — M25561 Pain in right knee: Secondary | ICD-10-CM | POA: Diagnosis not present

## 2022-07-20 DIAGNOSIS — G8929 Other chronic pain: Secondary | ICD-10-CM

## 2022-07-20 LAB — SURGICAL PATHOLOGY

## 2022-07-20 NOTE — Therapy (Signed)
OUTPATIENT PHYSICAL THERAPY TREATMENT NOTE   Patient Name: Wendy Woods MRN: [id number] DOB:Jan 19, 1961, 62 y.o., female Today's Date: 07/20/2022  PCP: Wendy Ebbs, MD   REFERRING PROVIDER: Persons, Wendy [place], [place]  END OF SESSION:   PT End of Session - 07/20/22 1357     Visit Number 5    Number of Visits 17    Date for PT Re-Evaluation 08/19/22    Authorization Type MCR/MCD    PT Start Time 1400    PT Stop Time 1444    PT Time Calculation (min) 44 min               Past Medical History:  Diagnosis Date   Asthma    as a child   DDD (degenerative disc disease), cervical 03/22/2016   DDD (degenerative disc disease), lumbar 03/22/2016   GERD (gastroesophageal reflux disease)    Hyperlipidemia    Hypertension    Rheumatoid arthritis ([place])    Rheumatoid arthritis(714.0)    Scleritis 03/22/2016   Seasonal allergies    Type II diabetes mellitus ([place])    Past Surgical History:  Procedure Laterality Date   APPENDECTOMY  1968   CATARACT EXTRACTION W/ INTRAOCULAR LENS  IMPLANT, BILATERAL Bilateral 11/2013-12/2013   KNEE ARTHROPLASTY     KNEE ARTHROSCOPY Left 1980's-1990's X 3   MYOMECTOMY  1990's   NASOPHARYNGEAL BIOPSY N/A 11/16/2021   Procedure: NASOPHARYNGEAL BIOPSY;  Surgeon: Wendy Quitter, MD;  Location: [facility] OR;  Service: ENT;  Laterality: N/A;   Patient Active Problem List   Diagnosis Date Noted   Papanicolaou smear of cervix with positive high risk human papilloma virus (HPV) test 07/18/2022   Leg pain, bilateral 02/22/2021   Low back pain 01/12/2021   Osteoarthritis cervical spine 01/12/2021   Pain in left foot 12/15/2020   Osteoarthritis of knees, 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   Chronic pain of left knee 01/10/2018   Scleritis 03/22/2016   Angioedema 07/15/2014   Hypokalemia 07/15/2014   Diabetes mellitus, controlled ([place]) 07/03/2012   HTN (hypertension) 07/03/2012   Rheumatoid arthritis ([place]) 07/03/2012   Current chronic use of systemic steroids 07/03/2012    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 I am going to talk to them about the right knee. The right knee is the worst. Foot hurts if I walk a lot and I walked a lot in [place]. The right knee is making me limp today. The foot does not hurt all the time. "    PAIN:  Are you having pain? Yes: NPRS scale: 7-8 (knees)  Lt (foot)8/10 Pain location: bilateral knees and Lt foot Pain description: ache Aggravating factors: walking, standing, lifting, (foot and knees) sitting (knees) Relieving factors: elevation    OBJECTIVE: (objective measures completed at initial evaluation unless otherwise dated)   DIAGNOSTIC FINDINGS:  2 view radiographs of the right knee were obtained today.  She has  tricompartmental arthritis with periarticular osteophytes and sclerotic  changes.  Very similar though not quite as advanced as her left knee.  No  acute fractures    MRI Lt foot: IMPRESSION: 1. Focal marrow edema about the dorsal aspect of the navicular bone with mild surrounding soft tissue edema likely sequela of recent trauma/small avulsion fracture. 2. Ligaments and tendons are intact. No evidence of significant arthropathy.   PATIENT SURVEYS:  FOTO 55% function to 62% predicted    COGNITION: Overall cognitive status: Within functional limits for tasks assessed                          SENSATION: Not tested   EDEMA:  Unable to inspect knees due to clothing    MUSCLE LENGTH: Hamstrings: WNL bilaterally    POSTURE: rounded shoulders and forward head   PALPATION: TTP over dorsal aspect of navicular No palpable tenderness about bilateral knees    LOWER EXTREMITY ROM:   Active ROM Right eval Left eval 07/05/22  07/20/22  Hip flexion        Hip extension        Hip abduction        Hip adduction        Hip internal rotation        Hip external rotation        Knee flexion 104 pn 103 pn Lt: 112; Rt 102 pn with both RT: 100 pn  LT: 118   Knee extension WNL WNL    Ankle dorsiflexion 13 12    Ankle plantarflexion WNL WNL    Ankle inversion WNL WNL    Ankle eversion WNL WNL     (Blank rows = not tested)   LOWER EXTREMITY MMT:   MMT Right eval Left eval  Hip flexion 4 4  Hip extension 3+ 3+  Hip abduction 3+ 3+  Hip adduction      Hip internal rotation      Hip external rotation      Knee flexion 4+ 4+  Knee extension 4- 4+  Ankle dorsiflexion 5 4 pn  Ankle plantarflexion Can complete SL calf raise Can complete SL calf raise pn!  Ankle inversion 5 4  Ankle eversion 5 4 pn   (Blank rows = not tested)   LOWER EXTREMITY SPECIAL TESTS:  N/A   FUNCTIONAL TESTS:  5 x STS: 27 seconds : 07/20/22: 25.27 sec SLS: 20 seconds bilateral    GAIT: Distance walked: 15 ft  Assistive device utilized: None Level of assistance: Complete Independence Comments: maintains supination during stance on the LLE, decreased push-off LLE  [facility] Adult PT Treatment:  DATE: 07/20/22 Therapeutic Exercise: Nustep L4 UE/LE x 5 minutes  Seated rockerboard 2 x 10; A/P Standing heel raise x 10 Bilat gastroc stretch standing x 2 10 sec each Seated H/s curls green 10 x 2 each  SLR with ER 10 x 2 each  Right heel slide on slider board x 10 AROM 3 way ankle red band 10 x 2 left  Seated toe scrunches  Baps board  left Level 2  Manual Therapy: Ankle mobs to increased DF, passive DF stretcing  Therapeutic Activity: 5 x STS    [facility] Adult PT Treatment:                                                DATE: 07/10/22 Therapeutic Exercise: NuStep level 4 x 5 minutes  [place] pickups x 10  Seated rockerboard 2 x 10; A/P Standing calf raise 2 x 10  Standing toe raises 2 x 10  A/P weight shifts x 10 each  Manual Therapy: STM anterior tibialis, peroneals, extensor digitorum Passive ankle PF and DF ROM Ankle mobilizations to improve dorsiflexion      [facility] Adult PT Treatment:                                                DATE: 07/05/22 Therapeutic Exercise: NuStep level 4 x 5 minutes UE/LE  SLR 2 x 10  Sidelying hip abduction 2 x 10  Resisted plantarflexion 2 x 10; red band  Resisted DF x 10; red band Seated toe raises 2 x 10  LAQ 2 x 10  Updated HEP  Manual Therapy: STM anterior tibialis, peroneals, extensor digitorum Passive ankle PF and DF ROM   [facility] Adult PT Treatment:                                                DATE: 07/03/22 Therapeutic Exercise: Nustep L3 UE/LE x 5 min Bridge 10 x 2  Clam 10 x 2 each  Quad stretch with strap 3 x 30 sec each  Supine ankle inversion and eversion iso 10 x 2  Adductor ball squeeze x12 Supine march x 10- c/ right knee pain SAQ bilat 10 x 2        PATIENT EDUCATION:  Education details: HEP review  Person educated: Patient Education method: Explanation Education comprehension: verbalized understanding   HOME EXERCISE PROGRAM: Access Code: [email or web address] URL: [email or web address] Date: 06/21/2022 Prepared by: Gwendolyn Grant   Exercises - Supine Quadriceps Stretch with Strap on Table  - 1 x daily - 7 x weekly - 3 sets - 30 sec  hold - Supine Bridge  - 1 x daily - 7 x weekly - 2 sets - 10 reps - Clamshell  - 1 x daily - 7 x weekly - 2 sets - 10 reps - Isometric Ankle Eversion at Wall  - 1 x daily - 7 x weekly - 2 sets - 10 reps - 5 sec   hold - Isometric Ankle Inversion  - 1 x daily - 7 x weekly - 2 sets - 10 reps - 5 sec  hold  ASSESSMENT:   CLINICAL IMPRESSION: Patient arrives with high pain levels about the knees and mild Lt foot pain. 5 x STS slightly improved. Knee AROM on left improved. Right knee is swollen and limited in knee flexion. She does tolerate bilateral heel raises in standing with reports of less pain than previous attempts. No additional goals met.     OBJECTIVE IMPAIRMENTS: Abnormal gait, decreased activity tolerance, decreased knowledge of condition, difficulty walking, decreased ROM, decreased strength, impaired flexibility, improper body mechanics, postural dysfunction, and pain.    ACTIVITY LIMITATIONS: carrying, lifting, bending, sitting, standing, squatting, and locomotion level   PARTICIPATION LIMITATIONS: meal prep, cleaning, laundry, driving, shopping, and community activity   PERSONAL FACTORS: Age, Fitness, Time since onset of injury/illness/exacerbation, and 3+ comorbidities: see PMH above  are also affecting patient's functional outcome.    REHAB POTENTIAL: Good   CLINICAL DECISION MAKING: Evolving/moderate complexity   EVALUATION COMPLEXITY: Moderate     GOALS: Goals reviewed with patient? Yes   SHORT TERM GOALS: Target date: 07/20/2022     Patient will be independent and compliant with initial HEP.    Baseline: issued at eval  Goal status: met   2.  Patient will demonstrate at least 110 degrees of bilateral knee flexion AROM to improve ease of sit <> stand transfers.  Baseline: see above  07/20/22: see above  Goal status: partially met    3.  Patient will complete 5 x STS in </= 18 seconds indicative of improved functional strength.  Baseline: see above  07/20/22: 25.27 sec , min improved Goal status: ONGOING   4.  Patient will demonstrate pain free SL calf raise on the LLE to improve ability to push-off during stance phase of gait cycle on the LLE. Baseline: see above   07/20/22: increased pain in foot today, deferred today Goal status: ONGOING     LONG TERM GOALS: Target date: 08/17/2022       Patient will demonstrate at least 4/5 bilateral hip abductor and extensor strength to improve stability about the chain with prolonged walking/standing activity.  Baseline: see above  Goal status: INITIAL   2.  Patient will tolerate at least 30 minutes of continuous standing activity with minimal pain to improve her ability to shop.  Baseline: reports being limited with shopping at [place] (can only make it through half of the store before having 9/10 pain)  Goal status: INITIAL   3.  Patient will demonstrate 5/5 bilateral knee strength to improve gait stability.  Baseline: see above  Goal status: INITIAL   4.  Patient will demonstrate 5/5 pain free Lt ankle strength to improve stability when navigating on uneven terrain.  Baseline: see above  Goal status: INITIAL   5.  Patient will score at least 62% on FOTO to signify clinically meaningful improvement in functional abilities.    Baseline: see above Goal status: INITIAL   6.  Patient will be independent with advanced home program to progress/maintain current level of function.  Baseline: initial HEP issued  Goal status: INITIAL     PLAN:   PT FREQUENCY: 1-2x/week   PT DURATION: 8 weeks   PLANNED INTERVENTIONS: Therapeutic exercises, Therapeutic activity, Neuromuscular re-education, Balance training, Gait training, Patient/Family education, Self Care, Joint mobilization, Aquatic Therapy, Dry Needling, Electrical stimulation, Cryotherapy, Moist heat, Ionotophoresis 4mg /ml Dexamethasone, Manual therapy, and Re-evaluation   PLAN FOR NEXT SESSION: review and progress HEP; hip/knee/ankle strength.   Hessie Diener, PTA 07/20/22 2:47 PM Phone: [phone] Fax: [phone]

## 2022-07-21 ENCOUNTER — Ambulatory Visit: Payer: 59 | Admitting: Physical Therapy

## 2022-07-26 ENCOUNTER — Ambulatory Visit: Payer: 59 | Admitting: Physician Assistant

## 2022-07-27 ENCOUNTER — Other Ambulatory Visit (HOSPITAL_COMMUNITY): Payer: Self-pay

## 2022-07-27 ENCOUNTER — Encounter: Payer: Self-pay | Admitting: Physician Assistant

## 2022-07-27 ENCOUNTER — Ambulatory Visit: Payer: 59

## 2022-07-27 ENCOUNTER — Ambulatory Visit: Payer: 59 | Attending: Physician Assistant | Admitting: Physician Assistant

## 2022-07-27 ENCOUNTER — Telehealth: Payer: Self-pay | Admitting: Pharmacist

## 2022-07-27 VITALS — BP 104/71 | HR 69 | Resp 14 | Ht 64.0 in | Wt 143.0 lb

## 2022-07-27 DIAGNOSIS — F419 Anxiety disorder, unspecified: Secondary | ICD-10-CM

## 2022-07-27 DIAGNOSIS — M0579 Rheumatoid arthritis with rheumatoid factor of multiple sites without organ or systems involvement: Secondary | ICD-10-CM

## 2022-07-27 DIAGNOSIS — M170 Bilateral primary osteoarthritis of knee: Secondary | ICD-10-CM

## 2022-07-27 DIAGNOSIS — H15003 Unspecified scleritis, bilateral: Secondary | ICD-10-CM

## 2022-07-27 DIAGNOSIS — M19042 Primary osteoarthritis, left hand: Secondary | ICD-10-CM

## 2022-07-27 DIAGNOSIS — Z8639 Personal history of other endocrine, nutritional and metabolic disease: Secondary | ICD-10-CM

## 2022-07-27 DIAGNOSIS — M8589 Other specified disorders of bone density and structure, multiple sites: Secondary | ICD-10-CM

## 2022-07-27 DIAGNOSIS — Z79899 Other long term (current) drug therapy: Secondary | ICD-10-CM

## 2022-07-27 DIAGNOSIS — M5030 Other cervical disc degeneration, unspecified cervical region: Secondary | ICD-10-CM

## 2022-07-27 DIAGNOSIS — M19041 Primary osteoarthritis, right hand: Secondary | ICD-10-CM

## 2022-07-27 DIAGNOSIS — I10 Essential (primary) hypertension: Secondary | ICD-10-CM

## 2022-07-27 DIAGNOSIS — M5136 Other intervertebral disc degeneration, lumbar region: Secondary | ICD-10-CM

## 2022-07-27 DIAGNOSIS — Z13220 Encounter for screening for lipoid disorders: Secondary | ICD-10-CM

## 2022-07-27 DIAGNOSIS — M7061 Trochanteric bursitis, right hip: Secondary | ICD-10-CM

## 2022-07-27 MED ORDER — RINVOQ 15 MG PO TB24: 15.0000 mg | ORAL_TABLET | Freq: Every day | ORAL | 0 refills | Status: DC

## 2022-07-27 NOTE — Telephone Encounter (Signed)
Patient is switching from Cimzia to Rinvoq. She was provided with 14-day sample today. Please start Rinvoq BIV pending OV note from today  Knox Saliva, PharmD, MPH, BCPS, CPP Clinical Pharmacist (Rheumatology and Pulmonology)

## 2022-07-27 NOTE — Progress Notes (Signed)
Pharmacy Note  Subjective: Patient presents today to [facility] for follow up office visit. Patient seen by the pharmacist for counseling on Rinvoq for rheumatoid arthritis..  Previous therapy include: MTX (inadequate response), leflunomide (stopped and restarted and now on it currently), Enbrel (waning response), Remicade (waning response), [name] (d/c after eye infection), Cimzia (recently, waning clinical response). Her last Cimzia dose was on 06/29/22  History of diverticulitis:  No  History of MI, stroke, or CV events:  No  Objective:  CMP     Component Value Date/Time   NA 135 05/26/2022 1054   K 4.4 05/26/2022 1054   CL 93 (L) 05/26/2022 1054   CO2 29 05/26/2022 1054   GLUCOSE 568 (HH) 05/26/2022 1054   BUN 12 05/26/2022 1054   CREATININE 0.93 05/26/2022 1054   CALCIUM 9.9 05/26/2022 1054   PROT 7.6 05/26/2022 1054   ALBUMIN 3.5 12/20/2021 0824   AST 10 05/26/2022 1054   ALT 11 05/26/2022 1054   ALKPHOS 60 12/20/2021 0824    CBC    Component Value Date/Time   WBC 6.3 05/26/2022 1054   RBC 4.13 05/26/2022 1054   HGB 12.2 05/26/2022 1054   HCT 37.1 05/26/2022 1054   PLT 452 (H) 05/26/2022 1054   MCV 89.8 05/26/2022 1054   MCH 29.5 05/26/2022 1054   MCHC 32.9 05/26/2022 1054   RDW 12.4 05/26/2022 1054    Baseline Immunosuppressant Therapy Labs TB GOLD    Latest Ref Rng & Units 01/18/2022    8:41 AM  Quantiferon TB Gold  Quantiferon TB Gold Plus Negative Negative    Hepatitis Panel   HIV No results found for: "HIV" Immunoglobulins   SPEP    Latest Ref Rng & Units 05/26/2022   10:54 AM  Serum Protein Electrophoresis  Total Protein 6.1 - 8.1 g/dL 7.6    Assessment/Plan:  Counseled patient that Rinvoq is a JAK inhibitor indicated for Rheumatoid Arthritis.  Counseled patient on purpose, proper use, and adverse effects of Rinvoq.    Reviewed the most common adverse effects including infection, diarrhea, headaches.  Also reviewed rare adverse  effects such as bowel injury and the need to contact us if they develop stomach pain during treatment. Counseled on the increase risk of venous thrombosis. Counseled about FDA black box warning of MACE (major adverse CV events including cardiovascular death, myocardial infarction, and stroke).  Reviewed with patient that there is the possibility of an increased risk of malignancy specifically lung cancer and lymphomas but it is not well understood if this increased risk is due to the medication or the disease state. Instructed patient that medication should be held for infection and prior to surgery.  Advised patient to avoid live vaccines. Recommend annual influenza, PCV 15 or PCV20 or Pneumovax 23, and Shingrix as indicated.   She has not received Shingles vaccine but has been advised to receive  Reviewed importance of routine lab monitoring including lipid panel.  Will recheck lipid panel 3 months after starting and annually thereafter. CBC and CMP will be monitored routinely every 3 months. Standing orders placed. Provided patient with medication education material and answered all questions.  Patient consented to Rinvoq.  Will upload into patient's chart.  Will apply through patient's insurance and update when we receive a response.    Patient dose will be Rinvoq 15 mg daily with hydroxychloroquine 200mg  twice daily Monday through Friday and leflunomide 20mg  daily.  Prescription will be sent to pharmacy pending lab results and insurance approval.  Patient provided with 14-day Rinvoq sample today which she plans to start today.  Knox Saliva, PharmD, MPH, BCPS, CPP Clinical Pharmacist (Rheumatology and Pulmonology)

## 2022-07-27 NOTE — Patient Instructions (Signed)
Standing Labs We placed an order today for your standing lab work.   Please have your standing labs drawn in 1 month then every 3 months   Please have your labs drawn 2 weeks prior to your appointment so that the provider can discuss your lab results at your appointment, if possible.  Please note that you may see your imaging and lab results in [facility] before we have reviewed them. We will contact you once all results are reviewed. Please allow our office up to 72 hours to thoroughly review all of the results before contacting the office for clarification of your results.  WALK-IN LAB HOURS  Monday through Thursday from 8:00 am -12:30 pm and 1:00 pm-5:00 pm and Friday from 8:00 am-12:00 pm.  Patients with office visits requiring labs will be seen before walk-in labs.  You may encounter longer than normal wait times. Please allow additional time. Wait times may be shorter on  Monday and Thursday afternoons.  We do not book appointments for walk-in labs. We appreciate your patience and understanding with our staff.   Labs are drawn by [facility]. Please bring your co-pay at the time of your lab draw.  You may receive a bill from [facility] for your lab work.  Please note if you are on Hydroxychloroquine and and an order has been placed for a Hydroxychloroquine level,  you will need to have it drawn 4 hours or more after your last dose.  If you wish to have your labs drawn at another location, please call the office 24 hours in advance so we can fax the orders.  The office is located at [address], [place], [place], [place] [address]   If you have any questions regarding directions or hours of operation,  please call [phone].   As a reminder, please drink plenty of water prior to coming for your lab work. Thanks!   Upadacitinib Extended-Release Tablets What is this medication? UPADACITINIB (ue PAD a SYE ti nib) treats autoimmune conditions, such as arthritis, eczema, and ulcerative  colitis. It is often used when other medications have not worked well enough or cannot be tolerated. It works by slowing down an overactive immune system. This decreases inflammation. This medicine may be used for other purposes; ask your health care provider or pharmacist if you have questions. COMMON BRAND NAME(S): RINVOQ What should I tell my care team before I take this medication? They need to know if you have any of these conditions: Blood clots Cancer Diabetes (high blood sugar) Heart disease High blood pressure High cholesterol Immune system problems Infection, especially a viral infection, such as chickenpox, cold sores, or herpes Infection, such as tuberculosis (TB), or other bacterial, fungal or viral infection Kidney disease Liver disease Low blood cell levels (white cells, red cells, and platelets) Lung or breathing disease, such as asthma or COPD Organ transplant Recent or upcoming vaccine Skin cancer/melanoma Stomach or intestine problems Stroke Tobacco use An unusual or allergic reaction to upadacitinib, other medications, foods, dyes or preservatives Pregnant or trying to get pregnant Breast-feeding How should I use this medication? Take this medication by mouth with water. Take it as directed on the prescription label at the same time every day. Do not cut, crush, or chew this medication. Swallow the tablets whole. You can take it with or without food. If it upsets your stomach, take it with food. Do not take this medication with grapefruit juice. Keep taking it unless your care team tells you to stop. A  special MedGuide will be given to you by the pharmacist with each prescription and refill. Be sure to read this information carefully each time. Talk to your care team about the use of this medication in children. While this medication may be prescribed for children as young as 12 years for selected conditions, precautions do apply. Overdosage: If you think you have  taken too much of this medicine contact a poison control center or emergency room at once. NOTE: This medicine is only for you. Do not share this medicine with others. What if I miss a dose? If you miss a dose, take it as soon as you can. If it is almost time for your next dose, take only that dose. Do not take double or extra doses. What may interact with this medication? Do not take this medication with any of the following: Baricitinib Tofacitinib This medication may also interact with the following: Azathioprine Certain antivirals for hepatitis or HIV Biologic medications, such as abatacept, adalimumab, anakinra, certolizumab, etanercept, golimumab, infliximab, rituximab, secukinumab, tocilizumab, ustekinumab Certain medications for fungal infections, such as ketoconazole, itraconazole, posaconazole, or voriconazole Certain medications for seizures, such as carbamazepine, phenobarbital, phenytoin Clarithromycin Cyclosporine Grapefruit and grapefruit juice Live virus vaccines Medications that lower your chance of fighting infection Mifepristone Nefazodone Rifampin Supplements, such as St. John's wort This list may not describe all possible interactions. Give your health care provider a list of all the medicines, herbs, non-prescription drugs, or dietary supplements you use. Also tell them if you smoke, drink alcohol, or use illegal drugs. Some items may interact with your medicine. What should I watch for while using this medication? Visit your care team for regular checks on your progress. Tell your care team if your symptoms do not start to get better or if they get worse. You may need blood work while you are taking this medication. This medication may increase your risk of getting an infection. Call your care team for advice if you get a fever, chills, sore throat, or other symptoms of a cold or flu. Do not treat yourself. Try to avoid being around people who are sick. Your care  team will screen you for tuberculosis (TB) before you start this medication. If they think you are at risk, you may be treated with medication for TB. You should start taking the medication for TB before you start this medication. Make sure to finish the full course of TB medication. Avoid taking medications that contain aspirin, acetaminophen, ibuprofen, naproxen, or ketoprofen unless instructed by your care team. These medications may hide a fever. Talk to your care team about your risk of cancer. You may be more at risk for certain types of cancer if you take this medication. This medication can make you more sensitive to the sun. Keep out of the sun. If you cannot avoid being in the sun, wear protective clothing and sunscreen. Do not use sun lamps, tanning beds, or tanning booths. Tell your care team right away if you have any change in your eyesight. Talk to your care team if you often see part of the tablet in your stool. Talk to your care team if you may be pregnant. Serious birth defects can occur if you take this medication during pregnancy and for 4 weeks after the last dose. You will need a negative pregnancy test before starting this medication. Contraception is recommended while taking this medication and for 4 weeks after the last dose. Your care team can help you find the option  that works for you. Do not breastfeed while taking this medication and for 6 days after the last dose. What side effects may I notice from receiving this medication? Side effects that you should report to your care team as soon as possible: Allergic reactions--skin rash, itching, hives, swelling of the face, lips, tongue, or throat Blood clot--pain, swelling, or warmth in the leg, shortness of breath, chest pain Change in vision Heart attack--pain or tightness in the chest, shoulders, arms, or jaw, nausea, shortness of breath, cold or clammy skin, feeling faint or lightheaded Infection--fever, chills, cough, sore  throat, wounds that don't heal, pain or trouble when passing urine, general feeling of discomfort or being unwell Liver injury--right upper belly pain, loss of appetite, nausea, light-colored stool, dark yellow or brown urine, yellowing skin or eyes, unusual weakness or fatigue Low red blood cell level--unusual weakness or fatigue, dizziness, headache, trouble breathing Sudden or severe stomach pain, bloody diarrhea, fever, nausea, vomiting Stroke--sudden numbness or weakness of the face, arm, or leg, trouble speaking, confusion, trouble walking, loss of balance or coordination, dizziness, severe headache, change in vision Side effects that usually do not require medical attention (report to your care team if they continue or are bothersome): Acne Cough Headache Nausea Runny or stuffy nose This list may not describe all possible side effects. Call your doctor for medical advice about side effects. You may report side effects to FDA at 1-800-FDA-1088. Where should I keep my medication? Keep out of the reach of children and pets. Store at room temperature between 20 and 25 degrees C (68 and 77 degrees F). Protect from moisture. Keep the container tightly closed. Keep this medication in the original container until you are ready to take it. Get rid of any unused medication after the expiration date. To get rid of medications that are no longer needed or have expired: Take the medication to a medication take-back program. Check with your pharmacy or law enforcement to find a location. If you cannot return the medication, check the label or package insert to see if the medication should be thrown out in the garbage or flushed down the toilet. If you are not sure, ask your care team. If it is safe to put it in the trash, empty the medication out of the container. Mix the medication with cat litter, dirt, coffee grounds, or other unwanted substance. Seal the mixture in a bag or container. Put it in the  trash. NOTE: This sheet is a summary. It may not cover all possible information. If you have questions about this medicine, talk to your doctor, pharmacist, or health care provider.  2023 Elsevier/Gold Standard (2022-03-16 00:00:00)

## 2022-07-27 NOTE — Telephone Encounter (Signed)
Medication Samples have been provided to the patient.  Drug name: Rinvoq 15mg  tablets Qty: 14 tabs LOT: [id number] Exp.Date: 09/24/2023  Dosing instructions: take one tablet by mouth once daily  The patient has been instructed regarding the correct time, dose, and frequency of taking this medication, including desired effects and most common side effects.   Nefertari Rebman S Emmalie Haigh 11:05 AM 07/27/2022

## 2022-07-27 NOTE — Telephone Encounter (Signed)
Submitted a Prior Authorization request to [facility] for [facility] via CoverMyMeds. Will update once we receive a response.  Key: [id number]  Knox Saliva, PharmD, MPH, BCPS, CPP Clinical Pharmacist (Rheumatology and Pulmonology)

## 2022-07-31 ENCOUNTER — Other Ambulatory Visit (HOSPITAL_COMMUNITY): Payer: Self-pay

## 2022-07-31 ENCOUNTER — Other Ambulatory Visit: Payer: Self-pay

## 2022-07-31 MED ORDER — RINVOQ 15 MG PO TB24
15.0000 mg | ORAL_TABLET | Freq: Every day | ORAL | 2 refills | Status: DC
  Filled 2022-07-31 (×2): qty 30, 30d supply, fill #0
  Filled 2022-08-28: qty 30, 30d supply, fill #1
  Filled 2022-09-22 (×3): qty 30, 30d supply, fill #2

## 2022-07-31 NOTE — Telephone Encounter (Signed)
Delivery instructions have been updated in [place], medication will be shipped to patient's home address on 08/01/22.  Rx has been processed in [facility] and the patient has no copay at this time.

## 2022-07-31 NOTE — Telephone Encounter (Signed)
Received notification from [facility] regarding a prior authorization for [facility]. Authorization has been APPROVED from 07/27/22 to 01/27/23. Approval letter sent to scan center.  Per test claim, copay for 30 days supply is $0  Patient can fill through [facility]: [phone]   Authorization # [id number] Phone # [phone]  Called patient to discuss approval. Advised that [facility] will reach out to schedule first shipment to home. Rx for Rinvoq has been sent to [facility]  Knox Saliva, PharmD, MPH, BCPS, CPP Clinical Pharmacist (Rheumatology and Pulmonology)=

## 2022-08-01 ENCOUNTER — Ambulatory Visit: Payer: 59 | Admitting: Physical Therapy

## 2022-08-04 ENCOUNTER — Ambulatory Visit: Payer: 59 | Attending: Internal Medicine | Admitting: Physical Therapy

## 2022-08-04 ENCOUNTER — Encounter: Payer: Self-pay | Admitting: Physical Therapy

## 2022-08-04 DIAGNOSIS — R262 Difficulty in walking, not elsewhere classified: Secondary | ICD-10-CM | POA: Diagnosis present

## 2022-08-04 DIAGNOSIS — M25562 Pain in left knee: Secondary | ICD-10-CM | POA: Diagnosis present

## 2022-08-04 DIAGNOSIS — G8929 Other chronic pain: Secondary | ICD-10-CM

## 2022-08-04 DIAGNOSIS — M25561 Pain in right knee: Secondary | ICD-10-CM | POA: Insufficient documentation

## 2022-08-04 DIAGNOSIS — M6281 Muscle weakness (generalized): Secondary | ICD-10-CM | POA: Diagnosis present

## 2022-08-04 DIAGNOSIS — M79672 Pain in left foot: Secondary | ICD-10-CM | POA: Diagnosis present

## 2022-08-04 NOTE — Therapy (Signed)
OUTPATIENT PHYSICAL THERAPY TREATMENT NOTE   Patient Name: Wendy Woods MRN: [id number] DOB:[birth year], 62 y.o., female Today's Date: 08/04/2022  PCP: Fleet Contras, MD   REFERRING PROVIDER: Persons, [place], [place]  END OF SESSION:   PT End of Session - 08/04/22 0932     Visit Number 6    Number of Visits 17    Date for PT Re-Evaluation 08/19/22    Authorization Type MCR/MCD    PT Start Time 0930    PT Stop Time 1015    PT Time Calculation (min) 45 min               Past Medical History:  Diagnosis Date   Asthma    as a child   DDD (degenerative disc disease), cervical 03/22/2016   DDD (degenerative disc disease), lumbar 03/22/2016   GERD (gastroesophageal reflux disease)    Hyperlipidemia    Hypertension    Rheumatoid arthritis    Rheumatoid arthritis(714.0)    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   Seasonal allergies    Type II diabetes mellitus    Past Surgical History:  Procedure Laterality Date   APPENDECTOMY  1968   CATARACT EXTRACTION W/ INTRAOCULAR LENS  IMPLANT, BILATERAL Bilateral 11/2013-12/2013   KNEE ARTHROPLASTY     KNEE ARTHROSCOPY Left 1980's-1990's X 3   MYOMECTOMY  1990's   NASOPHARYNGEAL BIOPSY N/A 11/16/2021   Procedure: NASOPHARYNGEAL BIOPSY;  Surgeon: Christia Reading, MD;  Location: [facility] OR;  Service: ENT;  Laterality: N/A;   Patient Active Problem List   Diagnosis Date Noted   Papanicolaou smear of cervix with positive high risk human papilloma virus (HPV) test 07/18/2022   Leg pain, bilateral 02/22/2021   Low back pain 01/12/2021   Osteoarthritis cervical spine 01/12/2021   Pain in left foot 12/15/2020   Osteoarthritis of knees, 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   Chronic pain of left knee 01/10/2018   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   Angioedema 07/15/2014   Hypokalemia 07/15/2014   Diabetes mellitus, controlled 07/03/2012   HTN (hypertension) 07/03/2012   Rheumatoid arthritis 07/03/2012   Current chronic use of systemic steroids 07/03/2012   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 I can tell it is helping but I ate some beef which I think flared up the right knee today. I was sore after last session. The foot hurts more if there is more weight on it like if I lift or carry something. "    PAIN:  Are you having pain? Yes: NPRS scale: 6 (knee)  Lt (foot)7/10 Pain location: Right knee and Lt foot Pain description: ache Aggravating factors: walking, standing, lifting, (foot and knees) sitting (knees) Relieving factors: elevation    OBJECTIVE: (objective measures completed at initial evaluation unless otherwise dated)   DIAGNOSTIC FINDINGS:  2 view radiographs of the right knee were obtained today.  She has  tricompartmental arthritis with periarticular osteophytes and sclerotic  changes.  Very similar though not quite as advanced as her left knee.  No  acute fractures    MRI Lt foot: IMPRESSION: 1. Focal marrow edema about the dorsal aspect of the navicular bone with mild surrounding soft tissue edema likely sequela of recent trauma/small avulsion fracture. 2. Ligaments and tendons are intact. No evidence of significant arthropathy.   PATIENT SURVEYS:  FOTO 55% function to 62% predicted  08/04/22: 54%   COGNITION: Overall cognitive status: Within functional  limits for tasks assessed                         SENSATION: Not tested   EDEMA:  Unable to inspect knees due to clothing    MUSCLE LENGTH: Hamstrings: WNL bilaterally    POSTURE: rounded shoulders and forward head   PALPATION: TTP over dorsal aspect of navicular No palpable tenderness about bilateral knees    LOWER EXTREMITY ROM:   Active ROM Right eval Left eval 07/05/22  07/20/22  Hip flexion        Hip extension        Hip abduction        Hip adduction        Hip internal rotation        Hip external rotation        Knee flexion 104 pn 103 pn Lt: 112; Rt 102 pn with both RT: 100 pn  LT: 118   Knee extension WNL WNL    Ankle dorsiflexion 13 12    Ankle plantarflexion WNL WNL    Ankle inversion WNL WNL    Ankle eversion WNL WNL     (Blank rows = not tested)   LOWER EXTREMITY MMT:   MMT Right eval Left eval Left 08/04/22  Hip flexion 4 4   Hip extension 3+ 3+   Hip abduction 3+ 3+   Hip adduction       Hip internal rotation       Hip external rotation       Knee flexion 4+ 4+   Knee extension 4- 4+   Ankle dorsiflexion 5 4 pn   Ankle plantarflexion Can complete SL calf raise Can complete SL calf raise pn!   Ankle inversion 5 4 4+  Ankle eversion 5 4 pn 4+ pn   (Blank rows = not tested)   LOWER EXTREMITY SPECIAL TESTS:  N/A   FUNCTIONAL TESTS:  5 x STS: 27 seconds : 07/20/22: 25.27 sec SLS: 20 seconds bilateral    GAIT: Distance walked: 15 ft  Assistive device utilized: None Level of assistance: Complete Independence Comments: maintains supination during stance on the  LLE, decreased push-off LLE  [facility] Adult PT Treatment:                                                DATE: 08/04/22 Therapeutic Exercise: Nustep L4 UE/LE x 5 minutes  Heel/toe raises Gastroc stretch 2 x 30 sec each  Baps board  L2 circles CW CCW Seated toe scrunches  Seated Greater toe extension- difficulty isolating toes from foot.  Seated Toe Yoga - very difficult without assist.  +HEP Seated LAQ Seated H/s curls- not tolerated today due to pain in knee   [facility] Adult PT Treatment:                                                DATE: 07/20/22 Therapeutic Exercise: Nustep L4 UE/LE x 5 minutes  Seated rockerboard 2 x 10; A/P Standing heel raise x 10 Bilat gastroc stretch standing x 2 10 sec each Seated H/s curls green 10 x 2 each  SLR with ER 10 x 2 each  Right heel slide on slider board x 10 AROM 3 way ankle red band 10 x 2 left  Seated toe scrunches  Baps board left Level 2  Manual Therapy: Ankle mobs to increased DF, passive DF stretcing  Therapeutic Activity: 5 x STS    [facility] Adult PT Treatment:                                                DATE: 07/10/22 Therapeutic Exercise: NuStep level 4 x 5 minutes  [place] pickups x 10  Seated rockerboard 2 x 10; A/P Standing calf raise 2 x 10  Standing toe raises 2 x 10  A/P weight shifts x 10 each  Manual Therapy: STM anterior tibialis, peroneals, extensor digitorum Passive ankle PF and DF ROM Ankle mobilizations to improve dorsiflexion      [facility] Adult PT Treatment:                                                DATE: 07/05/22 Therapeutic Exercise: NuStep level 4 x 5 minutes UE/LE  SLR 2 x 10  Sidelying hip abduction 2 x 10  Resisted plantarflexion 2 x 10; red band  Resisted DF x 10; red band Seated toe raises 2 x 10  LAQ 2 x 10  Updated HEP  Manual Therapy: STM anterior tibialis, peroneals, extensor digitorum Passive ankle PF and DF ROM   [facility] Adult PT Treatment:                                                DATE: 07/03/22 Therapeutic Exercise: Nustep L3 UE/LE x 5 min Bridge 10 x 2  Clam 10 x 2 each  Quad stretch with strap 3 x 30 sec each  Supine ankle inversion and eversion iso 10 x 2  Adductor ball squeeze x12 Supine march x 10- c/ right knee pain SAQ bilat 10 x 2        PATIENT EDUCATION:  Education details: HEP review  Person educated: Patient Education method:  Explanation Education comprehension: verbalized understanding   HOME EXERCISE PROGRAM: Access Code: [email or web address] URL: [email or web address] Date: 06/21/2022 Prepared by: Letitia Libra   Exercises - Supine Quadriceps Stretch with Strap on Table  - 1 x daily - 7 x weekly - 3 sets - 30 sec  hold - Supine Bridge  - 1 x daily - 7 x weekly - 2 sets - 10 reps - Clamshell  - 1 x daily - 7 x weekly - 2 sets - 10 reps - Isometric Ankle Eversion at Wall  - 1 x daily - 7 x weekly - 2 sets - 10 reps - 5 sec  hold - Isometric Ankle Inversion  - 1 x daily - 7 x weekly - 2 sets - 10 reps - 5 sec  hold - Long Sitting Ankle Plantar Flexion with Resistance  - 1 x daily - 7 x weekly - 2 sets - 10 reps - Seated Toe Raise  - 1 x daily - 7 x weekly - 2 sets - 10 reps - Seated Long Arc Quad  - 1 x daily - 7 x weekly - 2 sets - 10 reps - Toe Yoga - Alternating Great Toe and Lesser Toe Extension  - 1 x daily - 7 x weekly - 1 sets - 10 reps - 5 hold   ASSESSMENT:   CLINICAL IMPRESSION: Patient reports new medicine given from rheumatologist last week that seems to be helping her knee until today because she ate beef. Worked on intrinsic foot strengthening with pt showing difficulty isolating toe extension from the rest of her foot. Updated HEP to include intrinsic foot strengthening. No complaints with standing heel/toe raises. Basic open chain knee strengthening is painful today so did not progress. No additional goals met. FOTO score slightly decreases compared to initial.      OBJECTIVE IMPAIRMENTS: Abnormal gait, decreased activity tolerance, decreased knowledge of condition, difficulty walking, decreased ROM, decreased strength, impaired flexibility, improper body mechanics, postural dysfunction, and pain.    ACTIVITY LIMITATIONS: carrying, lifting, bending, sitting, standing, squatting, and locomotion level   PARTICIPATION LIMITATIONS: meal prep, cleaning, laundry, driving, shopping, and  community activity   PERSONAL FACTORS: Age, Fitness, Time since onset of injury/illness/exacerbation, and 3+ comorbidities: see PMH above  are also affecting patient's functional outcome.    REHAB POTENTIAL: Good   CLINICAL DECISION MAKING: Evolving/moderate complexity   EVALUATION COMPLEXITY: Moderate     GOALS: Goals reviewed with patient? Yes   SHORT TERM GOALS: Target date: 07/20/2022     Patient will be independent and compliant with initial HEP.    Baseline: issued at eval  Goal status: met   2.  Patient will demonstrate at least 110 degrees of bilateral knee flexion AROM to improve ease of sit <> stand transfers.  Baseline: see above  07/20/22: see above  Goal status: partially met    3.  Patient will complete 5 x STS in </= 18 seconds indicative of improved functional strength.  Baseline: see above  07/20/22: 25.27 sec , min improved Goal status: ONGOING   4.  Patient will demonstrate pain free SL calf raise on the LLE to improve ability to push-off during stance phase of gait cycle on the LLE. Baseline: see above  07/20/22: increased pain in foot today, deferred  today Goal status: ONGOING     LONG TERM GOALS: Target date: 08/17/2022       Patient will demonstrate at least 4/5 bilateral hip abductor and extensor strength to improve stability about the chain with prolonged walking/standing activity.  Baseline: see above  Goal status: INITIAL   2.  Patient will tolerate at least 30 minutes of continuous standing activity with minimal pain to improve her ability to shop.  Baseline: reports being limited with shopping at [employer] (can only make it through half of the store before having 9/10 pain)  08/04/22: sometimes tolerance is better, it just depends.  Goal status: ONGOING   3.  Patient will demonstrate 5/5 bilateral knee strength to improve gait stability.  Baseline: see above  Goal status: INITIAL   4.  Patient will demonstrate 5/5 pain free Lt ankle strength  to improve stability when navigating on uneven terrain.  Baseline: see above  Goal status: INITIAL   5.  Patient will score at least 62% on FOTO to signify clinically meaningful improvement in functional abilities.  Baseline: see above Goal status: INITIAL   6.  Patient will be independent with advanced home program to progress/maintain current level of function.  Baseline: initial HEP issued  Goal status: INITIAL     PLAN:   PT FREQUENCY: 1-2x/week   PT DURATION: 8 weeks   PLANNED INTERVENTIONS: Therapeutic exercises, Therapeutic activity, Neuromuscular re-education, Balance training, Gait training, Patient/Family education, Self Care, Joint mobilization, Aquatic Therapy, Dry Needling, Electrical stimulation, Cryotherapy, Moist heat, Ionotophoresis 4mg /ml Dexamethasone, Manual therapy, and Re-evaluation   PLAN FOR NEXT SESSION: review and progress HEP; hip/knee/ankle strength. Check STGs and progress hip and knee strengthening, update HEP  Jannette Spanner, PTA 08/04/22 10:33 AM Phone: [phone] Fax: [phone]

## 2022-08-07 ENCOUNTER — Ambulatory Visit: Payer: 59

## 2022-08-07 DIAGNOSIS — G8929 Other chronic pain: Secondary | ICD-10-CM

## 2022-08-07 DIAGNOSIS — R262 Difficulty in walking, not elsewhere classified: Secondary | ICD-10-CM

## 2022-08-07 DIAGNOSIS — M6281 Muscle weakness (generalized): Secondary | ICD-10-CM

## 2022-08-07 DIAGNOSIS — M25561 Pain in right knee: Secondary | ICD-10-CM | POA: Diagnosis not present

## 2022-08-07 DIAGNOSIS — M79672 Pain in left foot: Secondary | ICD-10-CM

## 2022-08-07 NOTE — Therapy (Signed)
OUTPATIENT PHYSICAL THERAPY TREATMENT NOTE   Patient Name: Wendy Woods MRN: [id number] DOB:09-02-1960, 62 y.o., female Today's Date: 08/07/2022  PCP: Fleet Contras, MD   REFERRING PROVIDER: Persons, [place], [place]  END OF SESSION:   PT End of Session - 08/07/22 1102     Visit Number 7    Number of Visits 17    Date for PT Re-Evaluation 08/19/22    Authorization Type MCR/MCD    PT Start Time 1101    PT Stop Time 1154    PT Time Calculation (min) 53 min    Activity Tolerance Patient tolerated treatment well    Behavior During Therapy [facility] for tasks assessed/performed               Past Medical History:  Diagnosis Date   Asthma    as a child   DDD (degenerative disc disease), cervical 03/22/2016   DDD (degenerative disc disease), lumbar 03/22/2016   GERD (gastroesophageal reflux disease)    Hyperlipidemia    Hypertension    Rheumatoid arthritis    Rheumatoid arthritis(714.0)    Scleritis 03/22/2016   Seasonal allergies    Type II diabetes mellitus    Past Surgical History:  Procedure Laterality Date   APPENDECTOMY  1968   CATARACT EXTRACTION W/ INTRAOCULAR LENS  IMPLANT, BILATERAL Bilateral 11/2013-12/2013   KNEE ARTHROPLASTY     KNEE ARTHROSCOPY Left 1980's-1990's X 3   MYOMECTOMY  1990's   NASOPHARYNGEAL BIOPSY N/A 11/16/2021   Procedure: NASOPHARYNGEAL BIOPSY;  Surgeon: Christia Reading, MD;  Location: [facility] OR;  Service: ENT;  Laterality: N/A;   Patient Active Problem List   Diagnosis Date Noted   Papanicolaou smear of cervix with positive high risk human papilloma virus (HPV) test 07/18/2022   Leg pain, bilateral 02/22/2021   Low back pain 01/12/2021   Osteoarthritis cervical spine 01/12/2021   Pain in left foot 12/15/2020   Osteoarthritis of knees, 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   Chronic pain of left knee 01/10/2018   Scleritis 03/22/2016   Angioedema 07/15/2014   Hypokalemia 07/15/2014   Diabetes mellitus, controlled 07/03/2012   HTN (hypertension) 07/03/2012    Rheumatoid arthritis 07/03/2012   Current chronic use of systemic steroids 07/03/2012   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 07/03/2012    REFERRING DIAG:  M25.561,G89.29 (ICD-10-CM) - Chronic pain of right knee  M79.672 (ICD-10-CM) - Pain in left foot    THERAPY DIAG:  Chronic pain of right knee  Pain in left foot  Chronic pain of left knee  Muscle weakness (generalized)  Difficulty in walking, not elsewhere classified  Rationale for Evaluation and Treatment Rehabilitation  PERTINENT HISTORY: Hypertension Rheumatoid arthritis  Diabetes  Knee arthroscopy Lt   PRECAUTIONS: Fall   WEIGHT BEARING RESTRICTIONS: No  SUBJECTIVE:  SUBJECTIVE STATEMENT:  Patient reports the Rt knee is swollen and the foot is doing "ok."     PAIN:  Are you having pain? Yes: NPRS scale: 10 (knee)  Lt (foot)5/10 Pain location: Right knee and Lt foot Pain description: ache Aggravating factors: walking, standing, lifting, (foot and knees) sitting (knees) Relieving factors: elevation    OBJECTIVE: (objective measures completed at initial evaluation unless otherwise dated)   DIAGNOSTIC FINDINGS:  2 view radiographs of the right knee were obtained today.  She has  tricompartmental arthritis with periarticular osteophytes and sclerotic  changes.  Very similar though not quite as advanced as her left knee.  No  acute fractures    MRI Lt foot: IMPRESSION: 1. Focal marrow edema about the dorsal aspect of the navicular bone with mild surrounding soft tissue edema likely sequela of recent trauma/small avulsion fracture. 2. Ligaments and tendons are intact. No evidence of significant arthropathy.   PATIENT SURVEYS:  FOTO 55% function to 62% predicted  08/04/22: 54%   COGNITION: Overall cognitive status: Within  functional limits for tasks assessed                         SENSATION: Not tested   EDEMA:  Unable to inspect knees due to clothing    MUSCLE LENGTH: Hamstrings: WNL bilaterally    POSTURE: rounded shoulders and forward head   PALPATION: TTP over dorsal aspect of navicular No palpable tenderness about bilateral knees    LOWER EXTREMITY ROM:   Active ROM Right eval Left eval 07/05/22  07/20/22 08/07/22  Hip flexion         Hip extension         Hip abduction         Hip adduction         Hip internal rotation         Hip external rotation         Knee flexion 104 pn 103 pn Lt: 112; Rt 102 pn with both RT: 100 pn  LT: 118  Rt: 100 pn Lt: 118   Knee extension WNL WNL     Ankle dorsiflexion 13 12     Ankle plantarflexion WNL WNL     Ankle inversion WNL WNL     Ankle eversion WNL WNL      (Blank rows = not tested)   LOWER EXTREMITY MMT:   MMT Right eval Left eval Left 08/04/22 08/07/22  Hip flexion 4 4    Hip extension 3+ 3+    Hip abduction 3+ 3+    Hip adduction        Hip internal rotation        Hip external rotation        Knee flexion 4+ 4+    Knee extension 4- 4+    Ankle dorsiflexion 5 4 pn    Ankle plantarflexion Can complete SL calf raise Can complete SL calf raise pn!  Can complete SL calf raise without pain LLE   Ankle inversion 5 4 4+   Ankle eversion 5 4 pn 4+ pn    (Blank rows = not tested)   LOWER EXTREMITY SPECIAL TESTS:  N/A   FUNCTIONAL TESTS:  5 x STS: 27 seconds : 07/20/22: 25.27 sec; 08/07/22: 28 seconds  SLS: 20 seconds bilateral    GAIT: Distance walked: 15 ft  Assistive device utilized: None Level of assistance: Complete Independence Comments: maintains supination during stance on the LLE, decreased  push-off LLE [facility] Adult PT Treatment:                                                DATE: 08/07/22 Therapeutic Exercise: NuStep level 5 x 5 minutes UE/LE  Seated marble pickups 2 x 15  Toe yoga multiple reps  Great toe extension multiple  reps  Resisted ankle inversion/eversion 2 x 10 green band  Standing calf raises 2 x 10  Updated HEP   Modalities: ice pack to Rt knee x 10 minutes   [facility] Adult PT Treatment:                                                DATE: 08/04/22 Therapeutic Exercise: Nustep L4 UE/LE x 5 minutes  Heel/toe raises Gastroc stretch 2 x 30 sec each  Baps board  L2 circles CW CCW Seated toe scrunches  Seated Greater toe extension- difficulty isolating toes from foot.  Seated Toe Yoga - very difficult without assist. +HEP Seated LAQ Seated H/s curls- not tolerated today due to pain in knee   [facility]                                                DATE: 07/20/22 Therapeutic Exercise: Nustep L4 UE/LE x 5 minutes  Seated rockerboard 2 x 10; A/P Standing heel raise x 10 Bilat gastroc stretch standing x 2 10 sec each Seated H/s curls green 10 x 2 each  SLR with ER 10 x 2 each  Right heel slide on slider board x 10 AROM 3 way ankle red band 10 x 2 left  Seated toe scrunches  Baps board left Level 2  Manual Therapy: Ankle mobs to increased DF, passive DF stretcing  Therapeutic Activity: 5 x STS    [facility] Adult PT Treatment:                                                DATE: 07/10/22 Therapeutic Exercise: NuStep level 4 x 5 minutes  Marble pickups x 10  Seated rockerboard 2 x 10; A/P Standing calf raise 2 x 10  Standing toe raises 2 x 10  A/P weight shifts x 10 each  Manual Therapy: STM anterior tibialis, peroneals, extensor digitorum Passive ankle PF and DF ROM Ankle mobilizations to improve dorsiflexion     PATIENT EDUCATION:  Education details: HEP update  Person educated: Patient Education method: Explanation, demo, cues, handout Education comprehension: verbalized understanding, returned demo, cues    HOME EXERCISE PROGRAM: Access Code: [email or web address] URL: [email or web address] Date: 06/21/2022 Prepared by: Letitia LibraSamantha Shalaina Guardiola   Exercises - Supine  Quadriceps Stretch with Strap on Table  - 1 x daily - 7 x weekly - 3 sets - 30 sec  hold - Supine Bridge  - 1 x daily - 7 x weekly - 2 sets - 10 reps - Clamshell  - 1 x daily - 7 x weekly - 2 sets - 10  reps - Isometric Ankle Eversion at Wall  - 1 x daily - 7 x weekly - 2 sets - 10 reps - 5 sec  hold - Isometric Ankle Inversion  - 1 x daily - 7 x weekly - 2 sets - 10 reps - 5 sec  hold - Long Sitting Ankle Plantar Flexion with Resistance  - 1 x daily - 7 x weekly - 2 sets - 10 reps - Seated Toe Raise  - 1 x daily - 7 x weekly - 2 sets - 10 reps - Seated Long Arc Quad  - 1 x daily - 7 x weekly - 2 sets - 10 reps - Toe Yoga - Alternating Great Toe and Lesser Toe Extension  - 1 x daily - 7 x weekly - 1 sets - 10 reps - 5 hold   ASSESSMENT:   CLINICAL IMPRESSION: Patient arrives with 10/10 Rt knee pain, but in NAD. She has met STG as it relates to her Lt foot, but has not met STG as they relate to her Rt knee at this time. Focused on progression of strength for the Lt foot as she is currently experiencing a flare up of arthritic knee pain. She has difficulty engaging extensor hallicus requiring continued reps to be able to isolate activation of this musculature. HEP was updated to include further ankle/foot instrinsic strengthening. She reports slight improvement in foot pain at conclusion of session. Finished session with ice to the Rt knee to assist in reducing pain and swelling.      OBJECTIVE IMPAIRMENTS: Abnormal gait, decreased activity tolerance, decreased knowledge of condition, difficulty walking, decreased ROM, decreased strength, impaired flexibility, improper body mechanics, postural dysfunction, and pain.    ACTIVITY LIMITATIONS: carrying, lifting, bending, sitting, standing, squatting, and locomotion level   PARTICIPATION LIMITATIONS: meal prep, cleaning, laundry, driving, shopping, and community activity   PERSONAL FACTORS: Age, Fitness, Time since onset of  injury/illness/exacerbation, and 3+ comorbidities: see PMH above  are also affecting patient's functional outcome.    REHAB POTENTIAL: Good   CLINICAL DECISION MAKING: Evolving/moderate complexity   EVALUATION COMPLEXITY: Moderate     GOALS: Goals reviewed with patient? Yes   SHORT TERM GOALS: Target date: 07/20/2022     Patient will be independent and compliant with initial HEP.    Baseline: issued at eval  Goal status: met   2.  Patient will demonstrate at least 110 degrees of bilateral knee flexion AROM to improve ease of sit <> stand transfers.  Baseline: see above  07/20/22: see above  Goal status: partially met    3.  Patient will complete 5 x STS in </= 18 seconds indicative of improved functional strength.  Baseline: see above  07/20/22: 25.27 sec , min improved Goal status: ONGOING   4.  Patient will demonstrate pain free SL calf raise on the LLE to improve ability to push-off during stance phase of gait cycle on the LLE. Baseline: see above  07/20/22: increased pain in foot today, deferred today Goal status: met     LONG TERM GOALS: Target date: 08/17/2022       Patient will demonstrate at least 4/5 bilateral hip abductor and extensor strength to improve stability about the chain with prolonged walking/standing activity.  Baseline: see above  Goal status: INITIAL   2.  Patient will tolerate at least 30 minutes of continuous standing activity with minimal pain to improve her ability to shop.  Baseline: reports being limited with shopping at [employer] (can only make it  through half of the store before having 9/10 pain)  08/04/22: sometimes tolerance is better, it just depends.  Goal status: ONGOING   3.  Patient will demonstrate 5/5 bilateral knee strength to improve gait stability.  Baseline: see above  Goal status: INITIAL   4.  Patient will demonstrate 5/5 pain free Lt ankle strength to improve stability when navigating on uneven terrain.  Baseline: see above   Goal status: INITIAL   5.  Patient will score at least 62% on FOTO to signify clinically meaningful improvement in functional abilities.  Baseline: see above Goal status: INITIAL   6.  Patient will be independent with advanced home program to progress/maintain current level of function.  Baseline: initial HEP issued  Goal status: INITIAL     PLAN:   PT FREQUENCY: 1-2x/week   PT DURATION: 8 weeks   PLANNED INTERVENTIONS: Therapeutic exercises, Therapeutic activity, Neuromuscular re-education, Balance training, Gait training, Patient/Family education, Self Care, Joint mobilization, Aquatic Therapy, Dry Needling, Electrical stimulation, Cryotherapy, Moist heat, Ionotophoresis 4mg /ml Dexamethasone, Manual therapy, and Re-evaluation   PLAN FOR NEXT SESSION: review and progress HEP; progress hip and knee and ankle strengthening, update HEP prn   Letitia Libra, PT, DPT, ATC 08/07/22 11:45 AM

## 2022-08-09 ENCOUNTER — Ambulatory Visit: Payer: 59 | Admitting: Physical Therapy

## 2022-08-09 ENCOUNTER — Encounter: Payer: Self-pay | Admitting: Physical Therapy

## 2022-08-09 DIAGNOSIS — M79672 Pain in left foot: Secondary | ICD-10-CM

## 2022-08-09 DIAGNOSIS — G8929 Other chronic pain: Secondary | ICD-10-CM

## 2022-08-09 DIAGNOSIS — M25561 Pain in right knee: Secondary | ICD-10-CM | POA: Diagnosis not present

## 2022-08-09 NOTE — Therapy (Signed)
OUTPATIENT PHYSICAL THERAPY TREATMENT NOTE   Patient Name: Wendy Woods MRN: [id number] DOB:1961-04-18, 62 y.o., female Today's Date: 08/09/2022  PCP: Fleet Contras, MD   REFERRING PROVIDER: Persons, [place], [place]  END OF SESSION:   PT End of Session - 08/09/22 0931     Visit Number 8    Number of Visits 17    Date for PT Re-Evaluation 08/19/22    Authorization Type MCR/MCD    PT Start Time 0930    PT Stop Time 1010    PT Time Calculation (min) 40 min               Past Medical History:  Diagnosis Date   Asthma    as a child   DDD (degenerative disc disease), cervical 03/22/2016   DDD (degenerative disc disease), lumbar 03/22/2016   GERD (gastroesophageal reflux disease)    Hyperlipidemia    Hypertension    Rheumatoid arthritis    Rheumatoid arthritis(714.0)    Scleritis 03/22/2016   Seasonal allergies    Type II diabetes mellitus    Past Surgical History:  Procedure Laterality Date   APPENDECTOMY  1968   CATARACT EXTRACTION W/ INTRAOCULAR LENS  IMPLANT, BILATERAL Bilateral 11/2013-12/2013   KNEE ARTHROPLASTY     KNEE ARTHROSCOPY Left 1980's-1990's X 3   MYOMECTOMY  1990's   NASOPHARYNGEAL BIOPSY N/A 11/16/2021   Procedure: NASOPHARYNGEAL BIOPSY;  Surgeon: Christia Reading, MD;  Location[facility] OR;  Service: ENT;  Laterality: N/A;   Patient Active Problem List   Diagnosis Date Noted   Papanicolaou smear of cervix with positive high risk human papilloma virus (HPV) test 07/18/2022   Leg pain, bilateral 02/22/2021   Low back pain 01/12/2021   Osteoarthritis cervical spine 01/12/2021   Pain in left foot 12/15/2020   Osteoarthritis of knees, 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   Chronic pain of left knee 01/10/2018   Scleritis 03/22/2016   Angioedema 07/15/2014   Hypokalemia 07/15/2014   Diabetes mellitus, controlled 07/03/2012   HTN (hypertension) 07/03/2012   Rheumatoid arthritis 07/03/2012   Current chronic use of systemic steroids 07/03/2012   Hypercalcemia due to a  drug 07/03/2012    REFERRING DIAG:  M25.561,G89.29 (ICD-10-CM) - Chronic pain of right knee  M79.672 (ICD-10-CM) - Pain in left foot    THERAPY DIAG:  Chronic pain of right knee  Pain in left foot  Rationale for Evaluation and Treatment Rehabilitation  PERTINENT HISTORY: Hypertension Rheumatoid arthritis  Diabetes  Knee arthroscopy Lt   PRECAUTIONS: Fall   WEIGHT BEARING RESTRICTIONS: No  SUBJECTIVE:  SUBJECTIVE STATEMENT:  Patient reports the Rt knee is swollen and the foot is doing "ok."     PAIN:  Are you having pain? Yes: NPRS scale: 4/10 (knee)  Lt (foot)5/10, wrist right 10/10 Pain location: Right knee and Lt foot Pain description: ache Aggravating factors: walking, standing, lifting, (foot and knees) sitting (knees) Relieving factors: elevation    OBJECTIVE: (objective measures completed at initial evaluation unless otherwise dated)   DIAGNOSTIC FINDINGS:  2 view radiographs of the right knee were obtained today.  She has  tricompartmental arthritis with periarticular osteophytes and sclerotic  changes.  Very similar though not quite as advanced as her left knee.  No  acute fractures    MRI Lt foot: IMPRESSION: 1. Focal marrow edema about the dorsal aspect of the navicular bone with mild surrounding soft tissue edema likely sequela of recent trauma/small avulsion fracture. 2. Ligaments and tendons are intact. No evidence of significant arthropathy.   PATIENT SURVEYS:  FOTO 55% function to 62% predicted  08/04/22: 54%   COGNITION: Overall cognitive status: Within functional limits for tasks assessed                         SENSATION: Not tested   EDEMA:  Unable to inspect knees due to clothing    MUSCLE LENGTH: Hamstrings: WNL bilaterally    POSTURE: rounded  shoulders and forward head   PALPATION: TTP over dorsal aspect of navicular No palpable tenderness about bilateral knees    LOWER EXTREMITY ROM:   Active ROM Right eval Left eval 07/05/22  07/20/22 08/07/22  Hip flexion         Hip extension         Hip abduction         Hip adduction         Hip internal rotation         Hip external rotation         Knee flexion 104 pn 103 pn Lt: 112; Rt 102 pn with both RT: 100 pn  LT: 118  Rt: 100 pn Lt: 118   Knee extension WNL WNL     Ankle dorsiflexion 13 12     Ankle plantarflexion WNL WNL     Ankle inversion WNL WNL     Ankle eversion WNL WNL      (Blank rows = not tested)   LOWER EXTREMITY MMT:   MMT Right eval Left eval Left 08/04/22 08/07/22 08/09/22 Right  Hip flexion 4 4     Hip extension 3+ 3+     Hip abduction 3+ 3+     Hip adduction         Hip internal rotation         Hip external rotation         Knee flexion 4+ 4+   4- pain  Knee extension 4- 4+   4  Ankle dorsiflexion 5 4 pn     Ankle plantarflexion Can complete SL calf raise Can complete SL calf raise pn!  Can complete SL calf raise without pain LLE    Ankle inversion 5 4 4+    Ankle eversion 5 4 pn 4+ pn     (Blank rows = not tested)   LOWER EXTREMITY SPECIAL TESTS:  N/A   FUNCTIONAL TESTS:  5 x STS: 27 seconds : 07/20/22: 25.27 sec; 08/07/22: 28 seconds  SLS: 20 seconds bilateral    GAIT: Distance walked: 15 ft  Assistive device utilized: None Level of assistance: Complete Independence Comments: maintains supination during stance on the LLE, decreased push-off LLE   [facility] Adult PT Treatment:                                                DATE: 08/09/22 Therapeutic Exercise: Nustep L4 LE only x 5 minutes Bilat heel raise Single leg heel raise  Wooden rocker board 9 sec L.  22 sec R Tandem 15 sec Left back Tandem >45 sec right back Marble pick up  Toe extensions  Toe scrunches  Toe Yoga LAQ 4# x 20 right  R SLR 10 x 2  Physioball h/s curls 10 x 2   Bridge 10 x 2  Seated red band Inv and Ev ankle x 10 each    [facility] Adult PT Treatment:                                                DATE: 08/07/22 Therapeutic Exercise: NuStep level 5 x 5 minutes UE/LE  Seated marble pickups 2 x 15  Toe yoga multiple reps  Great toe extension multiple reps  Resisted ankle inversion/eversion 2 x 10 green band  Standing calf raises 2 x 10  Updated HEP   Modalities: ice pack to Rt knee x 10 minutes   [facility] Adult PT Treatment:                                                DATE: 08/04/22 Therapeutic Exercise: Nustep L4 UE/LE x 5 minutes  Heel/toe raises Gastroc stretch 2 x 30 sec each  Baps board  L2 circles CW CCW Seated toe scrunches  Seated Greater toe extension- difficulty isolating toes from foot.  Seated Toe Yoga - very difficult without assist. +HEP Seated LAQ Seated H/s curls- not tolerated today due to pain in knee    [facility] Adult PT Treatment:                                                DATE: 07/20/22 Therapeutic Exercise: Nustep L4 UE/LE x 5 minutes  Seated rockerboard 2 x 10; A/P Standing heel raise x 10 Bilat gastroc stretch standing x 2 10 sec each Seated H/s curls green 10 x 2 each  SLR with ER 10 x 2 each  Right heel slide on slider board x 10 AROM 3 way ankle red band 10 x 2 left  Seated toe scrunches  Baps board left Level 2  Manual Therapy: Ankle mobs to increased DF, passive DF stretcing  Therapeutic Activity: 5 x STS    [facility] Adult PT Treatment:                                                DATE: 07/10/22 Therapeutic Exercise: NuStep level 4 x 5 minutes  Marble pickups x 10  Seated rockerboard 2 x 10; A/P Standing calf raise 2 x 10  Standing toe raises 2 x 10  A/P weight shifts x 10 each  Manual Therapy: STM anterior tibialis, peroneals, extensor digitorum Passive ankle PF and DF ROM Ankle mobilizations to improve dorsiflexion     PATIENT EDUCATION:  Education details: HEP update  Person educated:  Patient Education method: Explanation, demo, cues, handout Education comprehension: verbalized understanding, returned demo, cues    HOME EXERCISE PROGRAM: Access Code: [email or web address] URL: [email or web address] Date: 06/21/2022 Prepared by: Letitia Libra   Exercises - Supine Quadriceps Stretch with Strap on Table  - 1 x daily - 7 x weekly - 3 sets - 30 sec  hold - Supine Bridge  - 1 x daily - 7 x weekly - 2 sets - 10 reps - Clamshell  - 1 x daily - 7 x weekly - 2 sets - 10 reps - Isometric Ankle Eversion at Wall  - 1 x daily - 7 x weekly - 2 sets - 10 reps - 5 sec  hold - Isometric Ankle Inversion  - 1 x daily - 7 x weekly - 2 sets - 10 reps - 5 sec  hold - Long Sitting Ankle Plantar Flexion with Resistance  - 1 x daily - 7 x weekly - 2 sets - 10 reps - Seated Toe Raise  - 1 x daily - 7 x weekly - 2 sets - 10 reps - Seated Long Arc Quad  - 1 x daily - 7 x weekly - 2 sets - 10 reps - Toe Yoga - Alternating Great Toe and Lesser Toe Extension  - 1 x daily - 7 x weekly - 1 sets - 10 reps - 5 hold   ASSESSMENT:   CLINICAL IMPRESSION: Patient arrives with decreased  Rt knee pain however flared up right wrist pain rated at 10/10. Pain with knee flexion testing. She is able to complete single leg heel raise without pain. STG#4 met.  Reviewed gait mechanics and she demonstrates good heel strike and toe off. Min antalgia today as it is a "good knee day." With SLS she demonstrates more weakness LLE than RLE. Reviewed foot/ankle strengthening with continued difficulty isolating less toes  from great toes.  No complaints at end of session.       OBJECTIVE IMPAIRMENTS: Abnormal gait, decreased activity tolerance, decreased knowledge of condition, difficulty walking, decreased ROM, decreased strength, impaired flexibility, improper body mechanics, postural dysfunction, and pain.    ACTIVITY LIMITATIONS: carrying, lifting, bending, sitting, standing, squatting, and locomotion level    PARTICIPATION LIMITATIONS: meal prep, cleaning, laundry, driving, shopping, and community activity   PERSONAL FACTORS: Age, Fitness, Time since onset of injury/illness/exacerbation, and 3+ comorbidities: see PMH above  are also affecting patient's functional outcome.    REHAB POTENTIAL: Good   CLINICAL DECISION MAKING: Evolving/moderate complexity   EVALUATION COMPLEXITY: Moderate     GOALS: Goals reviewed with patient? Yes   SHORT TERM GOALS: Target date: 07/20/2022     Patient will be independent and compliant with initial HEP.    Baseline: issued at eval  Goal status: met   2.  Patient will demonstrate at least 110 degrees of bilateral knee flexion AROM to improve ease of sit <> stand transfers.  Baseline: see above  07/20/22: see above  Goal status: partially met    3.  Patient will complete 5 x STS in </= 18 seconds indicative of improved functional strength.  Baseline: see above  07/20/22: 25.27 sec , min improved Goal status: ONGOING   4.  Patient will demonstrate pain free SL calf raise on the LLE to improve ability to push-off during stance phase of gait cycle on the LLE. Baseline: see above  07/20/22: increased pain in foot today, deferred today 08/09/22: able to complete 5 heel raises without pain on LLE Goal status: met     LONG TERM GOALS: Target date: 08/17/2022       Patient will demonstrate at least 4/5 bilateral hip abductor and extensor strength to improve stability about the chain with prolonged walking/standing activity.  Baseline: see above  Goal status: INITIAL   2.  Patient will tolerate at least 30 minutes of continuous standing activity with minimal pain to improve her ability to shop.  Baseline: reports being limited with shopping at [employer] (can only make it through half of the store before having 9/10 pain)  08/04/22: sometimes tolerance is better, it just depends.  Goal status: ONGOING   3.  Patient will demonstrate 5/5 bilateral knee  strength to improve gait stability.  Baseline: see above  Goal status: INITIAL   4.  Patient will demonstrate 5/5 pain free Lt ankle strength to improve stability when navigating on uneven terrain.  Baseline: see above  Goal status: INITIAL   5.  Patient will score at least 62% on FOTO to signify clinically meaningful improvement in functional abilities.  Baseline: see above Goal status: INITIAL   6.  Patient will be independent with advanced home program to progress/maintain current level of function.  Baseline: initial HEP issued  Goal status: INITIAL     PLAN:   PT FREQUENCY: 1-2x/week   PT DURATION: 8 weeks   PLANNED INTERVENTIONS: Therapeutic exercises, Therapeutic activity, Neuromuscular re-education, Balance training, Gait training, Patient/Family education, Self Care, Joint mobilization, Aquatic Therapy, Dry Needling, Electrical stimulation, Cryotherapy, Moist heat, Ionotophoresis 4mg /ml Dexamethasone, Manual therapy, and Re-evaluation   PLAN FOR NEXT SESSION: review and progress HEP; progress hip and knee and ankle strengthening, update HEP prn   Jannette SpannerJessica Deven Furia, PTA 08/09/22 1:19 PM Phone: [phone] Fax: [phone]

## 2022-08-15 ENCOUNTER — Ambulatory Visit: Payer: 59

## 2022-08-15 DIAGNOSIS — M6281 Muscle weakness (generalized): Secondary | ICD-10-CM

## 2022-08-15 DIAGNOSIS — M25561 Pain in right knee: Secondary | ICD-10-CM | POA: Diagnosis not present

## 2022-08-15 DIAGNOSIS — M79672 Pain in left foot: Secondary | ICD-10-CM

## 2022-08-15 DIAGNOSIS — G8929 Other chronic pain: Secondary | ICD-10-CM

## 2022-08-15 DIAGNOSIS — R262 Difficulty in walking, not elsewhere classified: Secondary | ICD-10-CM

## 2022-08-15 NOTE — Therapy (Signed)
OUTPATIENT PHYSICAL THERAPY TREATMENT NOTE   Patient Name: Wendy Woods MRN: [id number] DOB:[birth year], 62 y.o., female Today's Date: 08/15/2022  PCP: Fleet Contras, MD   REFERRING PROVIDER: Persons, [place], [place]  END OF SESSION:   PT End of Session - 08/15/22 1015     Visit Number 9    Number of Visits 17    Date for PT Re-Evaluation 08/19/22    Authorization Type MCR/MCD    PT Start Time 1016    PT Stop Time 1059    PT Time Calculation (min) 43 min    Activity Tolerance Patient tolerated treatment well    Behavior During Therapy [facility] for tasks assessed/performed               Past Medical History:  Diagnosis Date   Asthma    as a child   DDD (degenerative disc disease), cervical 03/22/2016   DDD (degenerative disc disease), lumbar 03/22/2016   GERD (gastroesophageal reflux disease)    Hyperlipidemia    Hypertension    Rheumatoid arthritis    Rheumatoid arthritis(714.0)    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   Seasonal allergies    Type II diabetes mellitus    Past Surgical History:  Procedure Laterality Date   APPENDECTOMY  1968   CATARACT EXTRACTION W/ INTRAOCULAR LENS  IMPLANT, BILATERAL Bilateral 11/2013-12/2013   KNEE ARTHROPLASTY     KNEE ARTHROSCOPY Left 1980's-1990's X 3   MYOMECTOMY  1990's   NASOPHARYNGEAL BIOPSY N/A 11/16/2021   Procedure: NASOPHARYNGEAL BIOPSY;  Surgeon: Christia Reading, MD;  Location: [facility] OR;  Service: ENT;  Laterality: N/A;   Patient Active Problem List   Diagnosis Date Noted   Papanicolaou smear of cervix with positive high risk human papilloma virus (HPV) test 07/18/2022   Leg pain, bilateral 02/22/2021   Low back pain 01/12/2021   Osteoarthritis cervical spine 01/12/2021   Pain in left foot 12/15/2020   Osteoarthritis of knees, 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   Chronic pain of left knee 01/10/2018   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   Angioedema 07/15/2014   Hypokalemia 07/15/2014   Diabetes mellitus, controlled 07/03/2012   HTN (hypertension) 07/03/2012    Rheumatoid arthritis 07/03/2012   Current chronic use of systemic steroids 07/03/2012   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 07/03/2012    REFERRING DIAG:  M25.561,G89.29 (ICD-10-CM) - Chronic pain of right knee  M79.672 (ICD-10-CM) - Pain in left foot    THERAPY DIAG:  Chronic pain of right knee  Pain in left foot  Chronic pain of left knee  Muscle weakness (generalized)  Difficulty in walking, not elsewhere classified  Rationale for Evaluation and Treatment Rehabilitation  PERTINENT HISTORY: Hypertension Rheumatoid arthritis  Diabetes  Knee arthroscopy Lt   PRECAUTIONS: Fall   WEIGHT BEARING RESTRICTIONS: No  SUBJECTIVE:  SUBJECTIVE STATEMENT:  "I'm feeling ok. The warm weather helps. I still feel it on the side of the foot some"     PAIN:  Are you having pain? Yes: NPRS scale: 5/10 (knee)  Lt (foot)5/10 Pain location: bilateral knees and Lt foot Pain description: ache,tightness  Aggravating factors: walking, standing, lifting, (foot and knees) sitting (knees) Relieving factors: elevation    OBJECTIVE: (objective measures completed at initial evaluation unless otherwise dated)   DIAGNOSTIC FINDINGS:  2 view radiographs of the right knee were obtained today.  She has  tricompartmental arthritis with periarticular osteophytes and sclerotic  changes.  Very similar though not quite as advanced as her left knee.  No  acute fractures    MRI Lt foot: IMPRESSION: 1. Focal marrow edema about the dorsal aspect of the navicular bone with mild surrounding soft tissue edema likely sequela of recent trauma/small avulsion fracture. 2. Ligaments and tendons are intact. No evidence of significant arthropathy.   PATIENT SURVEYS:  FOTO 55% function to 62% predicted  08/04/22: 54%    COGNITION: Overall cognitive status: Within functional limits for tasks assessed                         SENSATION: Not tested   EDEMA:  Unable to inspect knees due to clothing    MUSCLE LENGTH: Hamstrings: WNL bilaterally    POSTURE: rounded shoulders and forward head   PALPATION: TTP over dorsal aspect of navicular No palpable tenderness about bilateral knees    LOWER EXTREMITY ROM:   Active ROM Right eval Left eval 07/05/22  07/20/22 08/07/22  Hip flexion         Hip extension         Hip abduction         Hip adduction         Hip internal rotation         Hip external rotation         Knee flexion 104 pn 103 pn Lt: 112; Rt 102 pn with both RT: 100 pn  LT: 118  Rt: 100 pn Lt: 118   Knee extension WNL WNL     Ankle dorsiflexion 13 12     Ankle plantarflexion WNL WNL     Ankle inversion WNL WNL     Ankle eversion WNL WNL      (Blank rows = not tested)   LOWER EXTREMITY MMT:   MMT Right eval Left eval Left 08/04/22 08/07/22 08/09/22 Right  Hip flexion 4 4     Hip extension 3+ 3+     Hip abduction 3+ 3+     Hip adduction         Hip internal rotation         Hip external rotation         Knee flexion 4+ 4+   4- pain  Knee extension 4- 4+   4  Ankle dorsiflexion 5 4 pn     Ankle plantarflexion Can complete SL calf raise Can complete SL calf raise pn!  Can complete SL calf raise without pain LLE    Ankle inversion 5 4 4+    Ankle eversion 5 4 pn 4+ pn     (Blank rows = not tested)   LOWER EXTREMITY SPECIAL TESTS:  N/A   FUNCTIONAL TESTS:  5 x STS: 27 seconds : 07/20/22: 25.27 sec; 08/07/22: 28 seconds  SLS: 20 seconds bilateral    GAIT: Distance walked:  15 ft  Assistive device utilized: None Level of assistance: Complete Independence Comments: maintains supination during stance on the LLE, decreased push-off LLE  [facility] Adult PT Treatment:                                                DATE: 08/15/22 Therapeutic Exercise: Resisted ankle inversion/eversion  red band 2 x 10  Standing rockerboard A/P 2 x 10  Calf raise on airex 2 x 15   Neuromuscular re-ed: Marching on airex 2 x 10  Romberg on foam eyes open x 30 sec Romberg on foam eyes closed 2 x 30 sec   Manual: IASTM to Lt peroneals, anterior tibialis     [facility] Adult PT Treatment:                                                DATE: 08/09/22 Therapeutic Exercise: Nustep L4 LE only x 5 minutes Bilat heel raise Single leg heel raise  Wooden rocker board 9 sec L.  22 sec R Tandem 15 sec Left back Tandem >45 sec right back Marble pick up  Toe extensions  Toe scrunches  Toe Yoga LAQ 4# x 20 right  R SLR 10 x 2  Physioball h/s curls 10 x 2  Bridge 10 x 2  Seated red band Inv and Ev ankle x 10 each    [facility] Adult PT Treatment:                                                DATE: 08/07/22 Therapeutic Exercise: NuStep level 5 x 5 minutes UE/LE  Seated marble pickups 2 x 15  Toe yoga multiple reps  Great toe extension multiple reps  Resisted ankle inversion/eversion 2 x 10 green band  Standing calf raises 2 x 10  Updated HEP   Modalities: ice pack to Rt knee x 10 minutes   [facility] Adult PT Treatment:                                                DATE: 08/04/22 Therapeutic Exercise: Nustep L4 UE/LE x 5 minutes  Heel/toe raises Gastroc stretch 2 x 30 sec each  Baps board  L2 circles CW CCW Seated toe scrunches  Seated Greater toe extension- difficulty isolating toes from foot.  Seated Toe Yoga - very difficult without assist. +HEP Seated LAQ Seated H/s curls- not tolerated today due to pain in knee     PATIENT EDUCATION:  Education details: HEP review  Person educated: Patient Education method: Explanation Education comprehension: verbalized understanding   HOME EXERCISE PROGRAM: Access Code: [email or web address] URL: [email or web address] Date: 06/21/2022 Prepared by: Letitia Libra   Exercises - Supine Quadriceps Stretch with Strap on Table  - 1 x daily - 7 x  weekly - 3 sets - 30 sec  hold - Supine Bridge  - 1 x daily - 7 x weekly - 2 sets - 10 reps - Clamshell  - 1  x daily - 7 x weekly - 2 sets - 10 reps - Isometric Ankle Eversion at Wall  - 1 x daily - 7 x weekly - 2 sets - 10 reps - 5 sec  hold - Isometric Ankle Inversion  - 1 x daily - 7 x weekly - 2 sets - 10 reps - 5 sec  hold - Long Sitting Ankle Plantar Flexion with Resistance  - 1 x daily - 7 x weekly - 2 sets - 10 reps - Seated Toe Raise  - 1 x daily - 7 x weekly - 2 sets - 10 reps - Seated Long Arc Quad  - 1 x daily - 7 x weekly - 2 sets - 10 reps - Toe Yoga - Alternating Great Toe and Lesser Toe Extension  - 1 x daily - 7 x weekly - 1 sets - 10 reps - 5 hold   ASSESSMENT:   CLINICAL IMPRESSION: Patient arrives with moderate Lt foot and bilateral knee pain today. Focused on progression of  LE strengthening and balance activity. Introduced balance activity on unstable surface with patient demonstrating fair postural control during SL activity on the LLE. She reported occasional tightness/pulling about anterolateral Lt foot, but otherwise good tolerance to today's session. Her foot pain was reduced with IASTM to peroneals and anterior tibialis.      OBJECTIVE IMPAIRMENTS: Abnormal gait, decreased activity tolerance, decreased knowledge of condition, difficulty walking, decreased ROM, decreased strength, impaired flexibility, improper body mechanics, postural dysfunction, and pain.    ACTIVITY LIMITATIONS: carrying, lifting, bending, sitting, standing, squatting, and locomotion level   PARTICIPATION LIMITATIONS: meal prep, cleaning, laundry, driving, shopping, and community activity   PERSONAL FACTORS: Age, Fitness, Time since onset of injury/illness/exacerbation, and 3+ comorbidities: see PMH above  are also affecting patient's functional outcome.    REHAB POTENTIAL: Good   CLINICAL DECISION MAKING: Evolving/moderate complexity   EVALUATION COMPLEXITY: Moderate     GOALS: Goals  reviewed with patient? Yes   SHORT TERM GOALS: Target date: 07/20/2022     Patient will be independent and compliant with initial HEP.    Baseline: issued at eval  Goal status: met   2.  Patient will demonstrate at least 110 degrees of bilateral knee flexion AROM to improve ease of sit <> stand transfers.  Baseline: see above  07/20/22: see above  Goal status: partially met    3.  Patient will complete 5 x STS in </= 18 seconds indicative of improved functional strength.  Baseline: see above  07/20/22: 25.27 sec , min improved Goal status: ONGOING   4.  Patient will demonstrate pain free SL calf raise on the LLE to improve ability to push-off during stance phase of gait cycle on the LLE. Baseline: see above  07/20/22: increased pain in foot today, deferred today 08/09/22: able to complete 5 heel raises without pain on LLE Goal status: met     LONG TERM GOALS: Target date: 08/17/2022       Patient will demonstrate at least 4/5 bilateral hip abductor and extensor strength to improve stability about the chain with prolonged walking/standing activity.  Baseline: see above  Goal status: INITIAL   2.  Patient will tolerate at least 30 minutes of continuous standing activity with minimal pain to improve her ability to shop.  Baseline: reports being limited with shopping at [employer] (can only make it through half of the store before having 9/10 pain)  08/04/22: sometimes tolerance is better, it just depends.  Goal status: ONGOING  3.  Patient will demonstrate 5/5 bilateral knee strength to improve gait stability.  Baseline: see above  Goal status: INITIAL   4.  Patient will demonstrate 5/5 pain free Lt ankle strength to improve stability when navigating on uneven terrain.  Baseline: see above  Goal status: INITIAL   5.  Patient will score at least 62% on FOTO to signify clinically meaningful improvement in functional abilities.  Baseline: see above Goal status: INITIAL   6.   Patient will be independent with advanced home program to progress/maintain current level of function.  Baseline: initial HEP issued  Goal status: INITIAL     PLAN:   PT FREQUENCY: 1-2x/week   PT DURATION: 8 weeks   PLANNED INTERVENTIONS: Therapeutic exercises, Therapeutic activity, Neuromuscular re-education, Balance training, Gait training, Patient/Family education, Self Care, Joint mobilization, Aquatic Therapy, Dry Needling, Electrical stimulation, Cryotherapy, Moist heat, Ionotophoresis /ml Dexamethasone, Manual therapy, and Re-evaluation   PLAN FOR NEXT SESSION: review and progress HEP; progress hip and knee and ankle strengthening, update HEP prn  Letitia Libra, PT, DPT, ATC 08/15/22 11:00 AM

## 2022-08-16 NOTE — Progress Notes (Deleted)
Office Visit Note  Patient: Wendy Woods             Date of Birth: [birth year]           MRN: [id number]             PCP: Fleet Contras, MD Referring: Fleet Contras, MD Visit Date: 08/30/2022 Occupation: @GUAROCC @  Subjective:  Mediation monitoring-rinvoq   History of Present Illness: Wendy Woods is a 62 y.o. female with history of seropositive rheumatoid arthritis, scleritis, and osteoarthritis.  Patient is taking rinvoq 15 mg 1 tablet by mouth daily.   CBC and CMP updated on 05/26/22. Orders for CBC and CMP released today.  TB gold negative on 01/18/22.  Lipid panel updated today.  Discussed the importance of holding rinvoq if she develops signs or symptoms of an infection and to resume once the infection has completely cleared.   Activities of Daily Living:  Patient reports morning stiffness for *** {minute/hour:19697}.   Patient {ACTIONS;DENIES/REPORTS:21021675::"Denies"} nocturnal pain.  Difficulty dressing/grooming: {ACTIONS;DENIES/REPORTS:21021675::"Denies"} Difficulty climbing stairs: {ACTIONS;DENIES/REPORTS:21021675::"Denies"} Difficulty getting out of chair: {ACTIONS;DENIES/REPORTS:21021675::"Denies"} Difficulty using hands for taps, buttons, cutlery, and/or writing: {ACTIONS;DENIES/REPORTS:21021675::"Denies"}  No Rheumatology ROS completed.   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   Angioedema 07/15/2014   Hypokalemia 07/15/2014   Diabetes mellitus, controlled 07/03/2012   HTN (hypertension) 07/03/2012   Rheumatoid arthritis 07/03/2012   Current chronic use of systemic steroids 07/03/2012    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 07/03/2012    Past Medical History:  Diagnosis Date   Asthma    as a child   DDD (degenerative disc disease), cervical 03/22/2016   DDD (degenerative disc disease), lumbar 03/22/2016   GERD (gastroesophageal reflux disease)    Hyperlipidemia    Hypertension    Rheumatoid arthritis    Rheumatoid arthritis(714.0)    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   Seasonal allergies    Type II diabetes mellitus     Family History  Problem Relation Age of Onset   Diabetes Paternal Grandfather    Heart failure Paternal Grandfather    Hypertension Paternal Grandmother    Stroke Paternal Grandmother    Heart attack Maternal Grandmother    Tuberculosis Maternal Grandfather    Colon polyps Father    Prostate cancer Father    Hypertension Mother    Anxiety disorder Mother    Breast cancer Cousin 21   Colon cancer Neg Hx    Past Surgical History:  Procedure Laterality Date   APPENDECTOMY  1968   CATARACT EXTRACTION W/ INTRAOCULAR LENS  IMPLANT, BILATERAL Bilateral 11/2013-12/2013   KNEE ARTHROPLASTY     KNEE ARTHROSCOPY Left [id number]'N X 3   MYOMECTOMY  1990's   NASOPHARYNGEAL BIOPSY N/A 11/16/2021   Procedure: NASOPHARYNGEAL BIOPSY;  Surgeon: Christia Reading, MD;  Location: [facility] OR;  Service: ENT;  Laterality: N/A;   Social History   Social History Narrative   Right Handed   Lives in a two story home    Immunization History  Administered Date(s) Administered   PFIZER(Purple Top)SARS-COV-2 Vaccination 04/23/2019, 05/12/2019, 01/28/2020     Objective: Vital Signs: LMP 07/09/2014    Physical  Exam Vitals and nursing note reviewed.  Constitutional:      Appearance: She is well-developed.  HENT:     Head: Normocephalic and atraumatic.  Eyes:     Conjunctiva/sclera: Conjunctivae normal.  Cardiovascular:     Rate and Rhythm: Normal rate and regular rhythm.     Heart sounds: Normal heart sounds.  Pulmonary:     Effort: Pulmonary effort is normal.     Breath sounds:  Normal breath sounds.  Abdominal:     General: Bowel sounds are normal.     Palpations: Abdomen is soft.  Musculoskeletal:     Cervical back: Normal range of motion.  Lymphadenopathy:     Cervical: No cervical adenopathy.  Skin:    General: Skin is warm and dry.     Capillary Refill: Capillary refill takes less than 2 seconds.  Neurological:     Mental Status: She is alert and oriented to person, place, and time.  Psychiatric:        Behavior: Behavior normal.      Musculoskeletal Exam: ***  CDAI Exam: CDAI Score: -- Patient Global: --; Provider Global: -- Swollen: --; Tender: -- Joint Exam 08/30/2022   No joint exam has been documented for this visit   There is currently no information documented on the homunculus. Go to the Rheumatology activity and complete the homunculus joint exam.  Investigation: No additional findings.  Imaging: No results found.  Recent Labs: Lab Results  Component Value Date   WBC 6.3 05/26/2022   HGB 12.2 05/26/2022   PLT 452 (H) 05/26/2022   NA 135 05/26/2022   K 4.4 05/26/2022   CL 93 (L) 05/26/2022   CO2 29 05/26/2022   GLUCOSE 568 (HH) 05/26/2022   BUN 12 05/26/2022   CREATININE 0.93 05/26/2022   BILITOT 0.2 05/26/2022   ALKPHOS 60 12/20/2021   AST 10 05/26/2022   ALT 11 05/26/2022   PROT 7.6 05/26/2022   ALBUMIN 3.5 12/20/2021   CALCIUM 9.9 05/26/2022   GFRAA >60 12/17/2019   QFTBGOLD Negative 09/18/2016   QFTBGOLDPLUS Negative 01/18/2022    Speciality Comments: Remicade 6mg / kg every 5 weeks-ACY 12/25/19 MTX -dcd 04/21 due to GI SEs  Procedures:  No procedures performed Allergies: Latex, Lisinopril, Penicillins, and Methotrexate derivatives   Assessment / Plan:     Visit Diagnoses: Rheumatoid arthritis involving multiple sites with positive rheumatoid factor  High risk medication use  Bilateral scleritis  Primary osteoarthritis of both hands  Trochanteric bursitis, right hip  Primary osteoarthritis of both  knees  DDD (degenerative disc disease), cervical  DDD (degenerative disc disease), lumbar  Osteopenia of multiple sites  History of vitamin D deficiency  Essential hypertension  History of diabetes mellitus, type II  Anxiety  Orders: No orders of the defined types were placed in this encounter.  No orders of the defined types were placed in this encounter.   Face-to-face time spent with patient was *** minutes. Greater than 50% of time was spent in counseling and coordination of care.  Follow-Up Instructions: No follow-ups on file.   Gearldine Bienenstock, PA-C  Note - This record has been created using Dragon software.  Chart creation errors have been sought, but may not always  have been located. Such creation errors do not reflect on  the standard of medical care.

## 2022-08-17 ENCOUNTER — Ambulatory Visit: Payer: 59

## 2022-08-17 DIAGNOSIS — M6281 Muscle weakness (generalized): Secondary | ICD-10-CM

## 2022-08-17 DIAGNOSIS — M79672 Pain in left foot: Secondary | ICD-10-CM

## 2022-08-17 DIAGNOSIS — G8929 Other chronic pain: Secondary | ICD-10-CM

## 2022-08-17 DIAGNOSIS — M25561 Pain in right knee: Secondary | ICD-10-CM | POA: Diagnosis not present

## 2022-08-17 DIAGNOSIS — R262 Difficulty in walking, not elsewhere classified: Secondary | ICD-10-CM

## 2022-08-17 NOTE — Therapy (Signed)
OUTPATIENT PHYSICAL THERAPY TREATMENT NOTE  Progress Note Reporting Period 06/21/22 to 08/17/22  See note below for Objective Data and Assessment of Progress/Goals.  PHYSICAL THERAPY DISCHARGE SUMMARY  Visits from Start of Care: 10  Current functional level related to goals / functional outcomes: See goals below   Remaining deficits: Ongoing Lt foot and bilateral knee pain Lt ankle, bilateral hip and knee weakness    Education / Equipment: See education below    Patient agrees to discharge. Patient goals were partially met. Patient is being discharged due to did not respond to therapy.     Patient Name: Wendy Woods MRN: [id number] DOB:[birth year], 62 y.o., female Today's Date: 08/17/2022  PCP: Fleet Contras, MD   REFERRING PROVIDER: Persons, [place], [place]  END OF SESSION:   PT End of Session - 08/17/22 1101     Visit Number 10    Number of Visits 17    Date for PT Re-Evaluation 08/19/22    Authorization Type MCR/MCD    PT Start Time 1101    PT Stop Time 1142    PT Time Calculation (min) 41 min    Activity Tolerance Patient tolerated treatment well    Behavior During Therapy [facility] for tasks assessed/performed                Past Medical History:  Diagnosis Date   Asthma    as a child   DDD (degenerative disc disease), cervical 03/22/2016   DDD (degenerative disc disease), lumbar 03/22/2016   GERD (gastroesophageal reflux disease)    Hyperlipidemia    Hypertension    Rheumatoid arthritis    Rheumatoid arthritis(714.0)    Scleritis 03/22/2016   Seasonal allergies    Type II diabetes mellitus    Past Surgical History:  Procedure Laterality Date   APPENDECTOMY  1968   CATARACT EXTRACTION W/ INTRAOCULAR LENS  IMPLANT, BILATERAL Bilateral 11/2013-12/2013   KNEE ARTHROPLASTY     KNEE ARTHROSCOPY Left 1980's-1990's X 3   MYOMECTOMY  1990's   NASOPHARYNGEAL BIOPSY N/A 11/16/2021   Procedure: NASOPHARYNGEAL BIOPSY;  Surgeon: Christia Reading, MD;  Location:  [facility] OR;  Service: ENT;  Laterality: N/A;   Patient Active Problem List   Diagnosis Date Noted   Papanicolaou smear of cervix with positive high risk human papilloma virus (HPV) test 07/18/2022   Leg pain, bilateral 02/22/2021   Low back pain 01/12/2021   Osteoarthritis cervical spine 01/12/2021   Pain in left foot 12/15/2020   Osteoarthritis of knees, 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   Chronic pain of left knee 01/10/2018   Scleritis 03/22/2016   Angioedema 07/15/2014   Hypokalemia 07/15/2014   Diabetes mellitus, controlled 07/03/2012   HTN (hypertension) 07/03/2012   Rheumatoid arthritis 07/03/2012   Current chronic use of systemic steroids 07/03/2012   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 The foot feels fine."     PAIN:  Are you having pain? Yes: NPRS scale: 5/10 (knee)  Lt (foot)3/10 Pain location: Bilateral knee (Rt>Lt) and Lt foot Pain description: ache,tightness  Aggravating factors: walking, standing, lifting, (foot and knees) sitting (knees) Relieving factors: elevation    OBJECTIVE: (objective measures completed at initial evaluation unless otherwise dated)   DIAGNOSTIC FINDINGS:  2 view radiographs of the right knee were obtained today.   She has  tricompartmental arthritis with periarticular osteophytes and sclerotic  changes.  Very similar though not quite as advanced as her left knee.  No  acute fractures    MRI Lt foot: IMPRESSION: 1. Focal marrow edema about the dorsal aspect of the navicular bone with mild surrounding soft tissue edema likely sequela of recent trauma/small avulsion fracture. 2. Ligaments and tendons are intact. No evidence of significant arthropathy.   PATIENT SURVEYS:  FOTO 55% function to 62% predicted  08/04/22: 54% 08/17/22: 63%   COGNITION: Overall cognitive status: Within functional limits for tasks assessed                         SENSATION: Not tested   EDEMA:  Unable to inspect knees due to clothing    MUSCLE LENGTH: Hamstrings: WNL bilaterally    POSTURE: rounded shoulders and forward head   PALPATION: TTP over dorsal aspect of navicular No palpable tenderness about bilateral knees   08/17/22: TTP dorsal aspect of navicular    LOWER EXTREMITY ROM:   Active ROM Right eval Left eval 07/05/22  07/20/22 08/07/22 08/17/22  Hip flexion          Hip extension          Hip abduction          Hip adduction          Hip internal rotation          Hip external rotation          Knee flexion 104 pn 103 pn Lt: 112; Rt 102 pn with both RT: 100 pn  LT: 118  Rt: 100 pn Lt: 118  Rt: 111   Knee extension WNL WNL      Ankle dorsiflexion 13 12      Ankle plantarflexion WNL WNL      Ankle inversion WNL WNL      Ankle eversion WNL WNL       (Blank rows = not tested)   LOWER EXTREMITY MMT:   MMT Right eval Left eval Left 08/04/22 08/07/22 08/09/22 Right 08/17/22 Left ankle  Hip flexion 4 4      Hip extension 3+ 3+    4 bilateral   Hip abduction 3+ 3+    4- bilateral   Hip adduction          Hip internal rotation          Hip external rotation          Knee flexion 4+ 4+   4- pain Rt: 4 pn; Lt:5  Knee extension 4- 4+   4 Rt: 4 pn; Lt: 5   Ankle dorsiflexion 5 4 pn    4 pn  Ankle  plantarflexion Can complete SL calf raise Can complete SL calf raise pn!  Can complete SL calf raise without pain LLE   Pain with SL calf raise   Ankle inversion 5 4 4+   4 pn  Ankle  eversion 5 4 pn 4+ pn   4 pn   (Blank rows = not tested)   LOWER EXTREMITY SPECIAL TESTS:  N/A   FUNCTIONAL TESTS:  5 x STS: 27 seconds : 07/20/22: 25.27 sec; 08/07/22: 28 seconds 08/17/22: 22 seconds  SLS: 20 seconds bilateral    GAIT: Distance walked: 15 ft  Assistive device utilized: None Level of assistance: Complete Independence Comments: maintains supination during stance on the LLE, decreased push-off LLE [facility] Adult PT Treatment:                                                DATE: 08/17/22 Therapeutic Exercise: NuStep level 5 x 5 minutes UE/LE Demonstrated and returned demo as needed for advanced HEP discussing sets, reps, frequency.  - Supine Bridge  - 1 x daily - 3 x weekly - 2 sets - 10 reps - Clamshell  - 1 x daily - 3 x weekly - 2 sets - 10 reps - Long Sitting Ankle Plantar Flexion with Resistance  - 1 x daily - 3 x weekly - 2 sets - 10 reps - Seated Toe Raise  - 1 x daily - 3 x weekly - 2 sets - 10 reps - Seated Long Arc Quad  - 1 x daily - 3 x weekly - 2 sets - 10 reps - Toe Yoga - Alternating Great Toe and Lesser Toe Extension  - 1 x daily - 3 x weekly - 1 sets - 10 reps - 5 hold - Seated Ankle Eversion with Resistance  - 1 x daily - 3 x weekly - 2 sets - 10 reps  Therapeutic Activity: Re-assessment to determine overall progress, educating patient on overall progress towards goals.    [facility] Adult PT Treatment:                                                DATE: 08/15/22 Therapeutic Exercise: Resisted ankle inversion/eversion red band 2 x 10  Standing rockerboard A/P 2 x 10  Calf raise on airex 2 x 15   Neuromuscular re-ed: Marching on airex 2 x 10  Romberg on foam eyes open x 30 sec Romberg on foam eyes closed 2 x 30 sec   Manual: IASTM to Lt peroneals, anterior tibialis      [facility] Adult PT Treatment:                                                DATE: 08/09/22 Therapeutic Exercise: Nustep L4 LE only x 5 minutes Bilat heel raise Single leg heel raise  Wooden rocker board 9 sec L.  22 sec R Tandem 15 sec Left back Tandem >45 sec right back Marble pick up  Toe extensions  Toe scrunches  Toe Yoga LAQ 4# x 20 right  R SLR 10 x 2  Physioball h/s curls 10 x 2  Bridge 10 x 2  Seated red band Inv and Ev ankle x 10 each     PATIENT EDUCATION:  Education details: see treatment; d/c education  Person educated: Patient Education method: Explanation, Public house manager Education comprehension:  verbalized understanding, returned demo    HOME EXERCISE PROGRAM: Access Code: [email or web address] URL: [email or web address] Date: 08/17/2022 Prepared by: Letitia Libra  Exercises - Supine Bridge  - 1 x daily - 3 x weekly - 2 sets - 10 reps - Clamshell  - 1 x daily - 3 x weekly - 2 sets - 10 reps - Long Sitting Ankle Plantar Flexion with Resistance  - 1 x daily - 3 x weekly - 2 sets - 10 reps - Seated Toe Raise  - 1 x daily - 3 x weekly - 2 sets - 10 reps - Seated Long Arc Quad  - 1 x daily - 3 x weekly - 2 sets - 10 reps - Toe Yoga - Alternating Great Toe and Lesser Toe Extension  - 1 x daily - 3 x weekly - 1 sets - 10 reps - 5 hold - Seated Ankle Eversion with Resistance  - 1 x daily - 3 x weekly - 2 sets - 10 reps   ASSESSMENT:   CLINICAL IMPRESSION: Wendy Woods has attended 10 PT session since the start of care on 06/21/22 for her Lt foot pain and bilateral knee pain. She continues to have palpable tenderness about dorsal aspect of navicular and pain is provoked with MMT of the Lt ankle. In regards to her knee pain she continues to have intermittent flare ups of knee pain related to her rheumatoid arthritis. Given her ongoing Lt foot pain despite various therapeutic interventions and time since her initial injury in December it was recommended that she f/u with  referring provider for further assessment with patient in agreement with this plan. She is therefore appropriate for discharge at this time.      OBJECTIVE IMPAIRMENTS: Abnormal gait, decreased activity tolerance, decreased knowledge of condition, difficulty walking, decreased ROM, decreased strength, impaired flexibility, improper body mechanics, postural dysfunction, and pain.    ACTIVITY LIMITATIONS: carrying, lifting, bending, sitting, standing, squatting, and locomotion level   PARTICIPATION LIMITATIONS: meal prep, cleaning, laundry, driving, shopping, and community activity   PERSONAL FACTORS: Age, Fitness, Time since onset of injury/illness/exacerbation, and 3+ comorbidities: see PMH above  are also affecting patient's functional outcome.    REHAB POTENTIAL: Good   CLINICAL DECISION MAKING: Evolving/moderate complexity   EVALUATION COMPLEXITY: Moderate     GOALS: Goals reviewed with patient? Yes   SHORT TERM GOALS: Target date: 07/20/2022     Patient will be independent and compliant with initial HEP.    Baseline: issued at eval  Goal status: met   2.  Patient will demonstrate at least 110 degrees of bilateral knee flexion AROM to improve ease of sit <> stand transfers.  Baseline: see above  07/20/22: see above  Goal status: met   3.  Patient will complete 5 x STS in </= 18 seconds indicative of improved functional strength.  Baseline: see above  07/20/22: 25.27 sec , min improved Goal status: not met    4.  Patient will demonstrate pain free SL calf raise on the LLE to improve ability to push-off during stance phase of gait cycle on the LLE. Baseline: see above  07/20/22: increased pain in foot today, deferred today 08/09/22: able to complete 5 heel raises without pain on LLE 08/17/22: pain about dorsum of foot with SL calf raise  Goal status: not met      LONG TERM GOALS: Target date: 08/17/2022       Patient will demonstrate at least 4/5 bilateral hip abductor  and extensor strength to improve  stability about the chain with prolonged walking/standing activity.  Baseline: see above  Goal status: partially met    2.  Patient will tolerate at least 30 minutes of continuous standing activity with minimal pain to improve her ability to shop.  Baseline: reports being limited with shopping at [employer] (can only make it through half of the store before having 9/10 pain)  08/04/22: sometimes tolerance is better, it just depends.  08/17/22: continues to have pain with shopping, still can't make it through [facility] without severe foot pain.  Goal status: not met   3.  Patient will demonstrate 5/5 bilateral knee strength to improve gait stability.  Baseline: see above  Goal status: not met   4.  Patient will demonstrate 5/5 pain free Lt ankle strength to improve stability when navigating on uneven terrain.  Baseline: see above  Goal status: not met   5.  Patient will score at least 62% on FOTO to signify clinically meaningful improvement in functional abilities.  Baseline: see above Goal status: met   6.  Patient will be independent with advanced home program to progress/maintain current level of function.  Baseline: initial HEP issued  Goal status: met     PLAN:   PT FREQUENCY: n/a   PT DURATION: n/a   PLANNED INTERVENTIONS: Therapeutic exercises, Therapeutic activity, Neuromuscular re-education, Balance training, Gait training, Patient/Family education, Self Care, Joint mobilization, Aquatic Therapy, Dry Needling, Electrical stimulation, Cryotherapy, Moist heat, Ionotophoresis /ml Dexamethasone, Manual therapy, and Re-evaluation   PLAN FOR NEXT SESSION: n/a  Letitia Libra, PT, DPT, ATC 08/17/22 1:12 PM

## 2022-08-18 ENCOUNTER — Ambulatory Visit (INDEPENDENT_AMBULATORY_CARE_PROVIDER_SITE_OTHER): Payer: 59 | Admitting: Physician Assistant

## 2022-08-18 ENCOUNTER — Other Ambulatory Visit (INDEPENDENT_AMBULATORY_CARE_PROVIDER_SITE_OTHER): Payer: 59

## 2022-08-18 ENCOUNTER — Encounter: Payer: Self-pay | Admitting: Physician Assistant

## 2022-08-18 DIAGNOSIS — M79672 Pain in left foot: Secondary | ICD-10-CM

## 2022-08-18 NOTE — Progress Notes (Signed)
Office Visit Note   Patient: Wendy Woods           Date of Birth: [birth year]           MRN: [id number] Visit Date: 08/18/2022              Requested by: Wendy Contras, MD [address] [place],  [name] [address] PCP: Wendy Contras, MD  Chief Complaint  Patient presents with   Left Knee - Follow-up   Right Knee - Follow-up   Left Foot - Follow-up      HPI: Wendy Woods is a very pleasant 62 year old woman who comes in today for follow-up on her left foot.  She had a history of dorsal left foot pain after falling down some stairs onto cement.  X-ray showed a small avulsion fracture off the dorsal navicular which is where she was painful.  She tried conservative treatment but did not improve including immobilization.  An MRI was ordered.  The MRI was normal with the exception of some edema of the navicular.  She has been doing physical therapy.  She comes in today because she said she is only gotten a slight amount better.  She describes her pain is moderate she does not like straps to touch this area of on the top of her foot she also has a history of rheumatoid arthritis.  She does have a hemoglobin A1c of 12.9 so steroid injections are difficult into her knees.  She said her right knee bothers her more than her left.  Assessment & Plan: Visit Diagnoses:  1. Pain in left foot     Plan: Her x-rays have shown no interval change.  She still is very sensitive to this area we talked about better shoe wear that was more supportive and arch supports.  She has had MRIs physical therapy and still is no improved.  I will refer her for a visit for her left foot with Dr. Lajoyce Woods.  We will also look into getting her viscosupplementation authorized  Follow-Up Instructions: No follow-ups on file.   Ortho Exam  Patient is alert, oriented, no adenopathy, well-dressed, normal affect, normal respiratory effort. Examination of her left foot she has a palpable dorsalis pedis pulse.  She is very  sensitive to touch over the dorsum of the foot at the talonavicular joint she has good dorsiflexion plantarflexion eversion inversion no pain in her ankle although with flexion she only comes to barely neutral but has good strength no cellulitis Bilateral knees she has slight warmth in the right knee but no effusion no redness no cellulitis she has crepitus with flexion extension she is globally tender calves are soft nontender negative Homans' sign  Imaging: XR Foot Complete Left  Result Date: 08/18/2022 Radiographs of her left foot were reviewed today well-maintained alignment no interval changes since previous films she does have a small dorsal avulsion fracture off the navicular  No images are attached to the encounter.  Labs: Lab Results  Component Value Date   HGBA1C 12.9 (H) 07/04/2012   REPTSTATUS 05/23/2017 FINAL 05/20/2017   CULT NO GROUP A STREP (S.PYOGENES) ISOLATED 05/20/2017     Lab Results  Component Value Date   ALBUMIN 3.5 12/20/2021   ALBUMIN 4.2 10/23/2021   ALBUMIN 3.5 10/11/2021    No results found for: "MG" No results found for: "VD25OH"  No results found for: "PREALBUMIN"    Latest Ref Rng & Units 05/26/2022   10:54 AM 03/28/2022    2:10 PM 02/10/2022  10:22 AM  CBC EXTENDED  WBC 3.8 - 10.8 Thousand/uL 6.3  6.4  8.2   RBC 3.80 - 5.10 Million/uL 4.13  4.04  4.38   Hemoglobin 11.7 - 15.5 g/dL 33.2  95.1  88.4   HCT 35.0 - 45.0 % 37.1  36.2  39.9   Platelets 140 - 400 Thousand/uL 452  360  393   NEUT# 1,500 - 7,800 cells/uL 3,408  3,091  4,994   Lymph# 850 - 3,900 cells/uL 2,205  2,720  2,485      There is no height or weight on file to calculate BMI.  Orders:  Orders Placed This Encounter  Procedures   XR Foot Complete Left   No orders of the defined types were placed in this encounter.    Procedures: No procedures performed  Clinical Data: No additional findings.  ROS:  All other systems negative, except as noted in the HPI. Review  of Systems  Objective: Vital Signs: LMP 07/09/2014   Specialty Comments:  No specialty comments available.  PMFS History: Patient Active Problem List   Diagnosis Date Noted   Papanicolaou smear of cervix with positive high risk human papilloma virus (HPV) test 07/18/2022   Leg pain, bilateral 02/22/2021   Low back pain 01/12/2021   Osteoarthritis cervical spine 01/12/2021   Pain in left foot 12/15/2020   Osteoarthritis of knees, 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   Chronic pain of left knee 01/10/2018   Scleritis 03/22/2016   Angioedema 07/15/2014   Hypokalemia 07/15/2014   Diabetes mellitus, controlled 07/03/2012   HTN (hypertension) 07/03/2012   Rheumatoid arthritis 07/03/2012   Current chronic use of systemic steroids 07/03/2012   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 07/03/2012   Past Medical History:  Diagnosis Date   Asthma    as a child   DDD (degenerative disc disease), cervical 03/22/2016   DDD (degenerative disc disease), lumbar 03/22/2016   GERD (gastroesophageal reflux disease)    Hyperlipidemia    Hypertension    Rheumatoid arthritis    Rheumatoid arthritis(714.0)    Scleritis 03/22/2016   Seasonal allergies    Type II diabetes mellitus     Family History  Problem Relation Age of Onset   Diabetes Paternal Grandfather    Heart failure Paternal Grandfather    Hypertension Paternal Grandmother    Stroke Paternal Grandmother    Heart attack Maternal Grandmother    Tuberculosis Maternal Grandfather    Colon polyps Father    Prostate cancer Father    Hypertension Mother    Anxiety disorder Mother    Breast cancer Cousin 45   Colon cancer Neg Hx     Past Surgical History:  Procedure Laterality Date   APPENDECTOMY  1968   CATARACT EXTRACTION W/ INTRAOCULAR LENS  IMPLANT, BILATERAL Bilateral 11/2013-12/2013   KNEE ARTHROPLASTY     KNEE ARTHROSCOPY Left 1980's-1990's X 3   MYOMECTOMY  1990's   NASOPHARYNGEAL BIOPSY N/A 11/16/2021   Procedure: NASOPHARYNGEAL BIOPSY;  Surgeon:  Wendy Reading, MD;  Location: [facility] OR;  Service: ENT;  Laterality: N/A;   Social History   Occupational History   Not on file  Tobacco Use   Smoking status: Never    Passive exposure: Never   Smokeless tobacco: Never  Vaping Use   Vaping Use: Never used  Substance and Sexual Activity   Alcohol use: No    Alcohol/week: 0.0 standard drinks of alcohol   Drug use: No   Sexual activity: Yes

## 2022-08-22 ENCOUNTER — Other Ambulatory Visit (HOSPITAL_COMMUNITY): Payer: Self-pay

## 2022-08-23 ENCOUNTER — Ambulatory Visit: Payer: 59

## 2022-08-24 ENCOUNTER — Ambulatory Visit (INDEPENDENT_AMBULATORY_CARE_PROVIDER_SITE_OTHER): Payer: 59 | Admitting: Orthopedic Surgery

## 2022-08-24 ENCOUNTER — Encounter: Payer: Self-pay | Admitting: Orthopedic Surgery

## 2022-08-24 DIAGNOSIS — M79672 Pain in left foot: Secondary | ICD-10-CM

## 2022-08-24 NOTE — Progress Notes (Signed)
Office Visit Note   Patient: Wendy Woods           Date of Birth: 09-07-1960           MRN: [id number] Visit Date: 08/24/2022              Requested by: Fleet Contras, MD [address] [place],  [name] [address] PCP: Fleet Contras, MD  Chief Complaint  Patient presents with   Left Foot - Pain      HPI: Patient is a 62 year old woman who was seen by Clerance Lav last week.  Patient has undergone physical therapy x-rays and MRI scan of her left foot.  Patient complains of lateral foot pain and dorsal pain over the talonavicular joint.  Patient has a history of rheumatoid arthritis and is on Plaquenil.  Patient also has a history of hypertension and diabetes.  Assessment & Plan: Visit Diagnoses:  1. Pain in left foot     Plan: Recommended Achilles stretching and this was demonstrated to her.  Recommended a stiff soled walking sneaker and or a carbon insert.  She may also proceed with Voltaren gel.  Follow-Up Instructions: Return if symptoms worsen or fail to improve.   Ortho Exam  Patient is alert, oriented, no adenopathy, well-dressed, normal affect, normal respiratory effort. Examination patient has good pulses.  With her knee extended she has dorsiflexion only to neutral.  There is tender to palpation over the talonavicular joint.  She is also tender over the lateral border of the fifth metatarsal.  The subtalar joint is not tender to palpation no pain with inversion or eversion.  Review of the MRI scan shows some edema dorsally over the talonavicular joint no fractures no displacement across the Lisfranc complex.  Imaging: No results found. No images are attached to the encounter.  Labs: Lab Results  Component Value Date   HGBA1C 12.9 (H) 07/04/2012   REPTSTATUS 05/23/2017 FINAL 05/20/2017   CULT NO GROUP A STREP (S.PYOGENES) ISOLATED 05/20/2017     Lab Results  Component Value Date   ALBUMIN 3.5 12/20/2021   ALBUMIN 4.2 10/23/2021   ALBUMIN 3.5 10/11/2021     No results found for: "MG" No results found for: "VD25OH"  No results found for: "PREALBUMIN"    Latest Ref Rng & Units 05/26/2022   10:54 AM 03/28/2022    2:10 PM 02/10/2022   10:22 AM  CBC EXTENDED  WBC 3.8 - 10.8 Thousand/uL 6.3  6.4  8.2   RBC 3.80 - 5.10 Million/uL 4.13  4.04  4.38   Hemoglobin 11.7 - 15.5 g/dL 19.1  47.8  29.5   HCT 35.0 - 45.0 % 37.1  36.2  39.9   Platelets 140 - 400 Thousand/uL 452  360  393   NEUT# 1,500 - 7,800 cells/uL 3,408  3,091  4,994   Lymph# 850 - 3,900 cells/uL 2,205  2,720  2,485      There is no height or weight on file to calculate BMI.  Orders:  No orders of the defined types were placed in this encounter.  No orders of the defined types were placed in this encounter.    Procedures: No procedures performed  Clinical Data: No additional findings.  ROS:  All other systems negative, except as noted in the HPI. Review of Systems  Objective: Vital Signs: LMP 07/09/2014   Specialty Comments:  No specialty comments available.  PMFS History: Patient Active Problem List   Diagnosis Date Noted   Papanicolaou smear of cervix  with positive high risk human papilloma virus (HPV) test 07/18/2022   Leg pain, bilateral 02/22/2021   Low back pain 01/12/2021   Osteoarthritis cervical spine 01/12/2021   Pain in left foot 12/15/2020   Osteoarthritis of knees, 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   Chronic pain of left knee 01/10/2018   Scleritis 03/22/2016   Angioedema 07/15/2014   Hypokalemia 07/15/2014   Diabetes mellitus, controlled 07/03/2012   HTN (hypertension) 07/03/2012   Rheumatoid arthritis 07/03/2012   Current chronic use of systemic steroids 07/03/2012   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 07/03/2012   Past Medical History:  Diagnosis Date   Asthma    as a child   DDD (degenerative disc disease), cervical 03/22/2016   DDD (degenerative disc disease), lumbar 03/22/2016   GERD (gastroesophageal reflux disease)    Hyperlipidemia     Hypertension    Rheumatoid arthritis    Rheumatoid arthritis(714.0)    Scleritis 03/22/2016   Seasonal allergies    Type II diabetes mellitus     Family History  Problem Relation Age of Onset   Diabetes Paternal Grandfather    Heart failure Paternal Grandfather    Hypertension Paternal Grandmother    Stroke Paternal Grandmother    Heart attack Maternal Grandmother    Tuberculosis Maternal Grandfather    Colon polyps Father    Prostate cancer Father    Hypertension Mother    Anxiety disorder Mother    Breast cancer Cousin 65   Colon cancer Neg Hx     Past Surgical History:  Procedure Laterality Date   APPENDECTOMY  1968   CATARACT EXTRACTION W/ INTRAOCULAR LENS  IMPLANT, BILATERAL Bilateral 11/2013-12/2013   KNEE ARTHROPLASTY     KNEE ARTHROSCOPY Left 1980's-1990's X 3   MYOMECTOMY  1990's   NASOPHARYNGEAL BIOPSY N/A 11/16/2021   Procedure: NASOPHARYNGEAL BIOPSY;  Surgeon: Christia Reading, MD;  Location: [facility] OR;  Service: ENT;  Laterality: N/A;   Social History   Occupational History   Not on file  Tobacco Use   Smoking status: Never    Passive exposure: Never   Smokeless tobacco: Never  Vaping Use   Vaping Use: Never used  Substance and Sexual Activity   Alcohol use: No    Alcohol/week: 0.0 standard drinks of alcohol   Drug use: No   Sexual activity: Yes

## 2022-08-25 ENCOUNTER — Other Ambulatory Visit (HOSPITAL_COMMUNITY): Payer: Self-pay

## 2022-08-25 ENCOUNTER — Ambulatory Visit: Payer: 59

## 2022-08-28 ENCOUNTER — Ambulatory Visit: Payer: 59 | Admitting: Physical Therapy

## 2022-08-28 ENCOUNTER — Other Ambulatory Visit (HOSPITAL_COMMUNITY): Payer: Self-pay

## 2022-08-30 ENCOUNTER — Ambulatory Visit: Payer: 59 | Admitting: Physician Assistant

## 2022-08-30 DIAGNOSIS — M5030 Other cervical disc degeneration, unspecified cervical region: Secondary | ICD-10-CM

## 2022-08-30 DIAGNOSIS — M19041 Primary osteoarthritis, right hand: Secondary | ICD-10-CM

## 2022-08-30 DIAGNOSIS — Z8639 Personal history of other endocrine, nutritional and metabolic disease: Secondary | ICD-10-CM

## 2022-08-30 DIAGNOSIS — M0579 Rheumatoid arthritis with rheumatoid factor of multiple sites without organ or systems involvement: Secondary | ICD-10-CM

## 2022-08-30 DIAGNOSIS — I10 Essential (primary) hypertension: Secondary | ICD-10-CM

## 2022-08-30 DIAGNOSIS — F419 Anxiety disorder, unspecified: Secondary | ICD-10-CM

## 2022-08-30 DIAGNOSIS — M7061 Trochanteric bursitis, right hip: Secondary | ICD-10-CM

## 2022-08-30 DIAGNOSIS — M5136 Other intervertebral disc degeneration, lumbar region: Secondary | ICD-10-CM

## 2022-08-30 DIAGNOSIS — Z79899 Other long term (current) drug therapy: Secondary | ICD-10-CM

## 2022-08-30 DIAGNOSIS — H15003 Unspecified scleritis, bilateral: Secondary | ICD-10-CM

## 2022-08-30 DIAGNOSIS — M8589 Other specified disorders of bone density and structure, multiple sites: Secondary | ICD-10-CM

## 2022-08-30 DIAGNOSIS — M170 Bilateral primary osteoarthritis of knee: Secondary | ICD-10-CM

## 2022-08-31 ENCOUNTER — Other Ambulatory Visit (HOSPITAL_COMMUNITY): Payer: Self-pay

## 2022-09-04 ENCOUNTER — Ambulatory Visit: Payer: Medicare Other | Admitting: Physician Assistant

## 2022-09-20 ENCOUNTER — Other Ambulatory Visit (HOSPITAL_COMMUNITY): Payer: Self-pay

## 2022-09-22 ENCOUNTER — Other Ambulatory Visit (HOSPITAL_COMMUNITY): Payer: Self-pay

## 2022-10-18 ENCOUNTER — Other Ambulatory Visit (HOSPITAL_COMMUNITY): Payer: Self-pay

## 2022-10-20 ENCOUNTER — Other Ambulatory Visit (HOSPITAL_COMMUNITY): Payer: Self-pay

## 2022-10-23 ENCOUNTER — Other Ambulatory Visit (HOSPITAL_COMMUNITY): Payer: Self-pay

## 2022-11-02 ENCOUNTER — Other Ambulatory Visit: Payer: Self-pay | Admitting: Physician Assistant

## 2022-11-03 NOTE — Telephone Encounter (Addendum)
Last Fill: 05/26/2022  Eye exam: not on file.    Labs: 05/26/2022 Plt count is slightly elevated-likely due to inflammation. Rest of CBC WNL.  Reviewed telephone encounter from 05/27/22 documented by Dr. Dimple Casey for the critically high blood glucose of 568.   Next Visit: 11/10/2022  Last Visit: 07/27/2022  [id number] arthritis involving multiple sites with positive rheumatoid factor   Current Dose per office note on 328/2024: plaquenil 200 mg 1 tablet BID M-F    Called patient and advised we need [name] eye exam and patient is in need of a follow up and labs. Patient is now scheduled for 11/10/2022. Patient will call to schedule eye exam.   Okay to refill 30 day supply of Plaquenil?

## 2022-11-07 NOTE — Progress Notes (Signed)
Office Visit Note  Patient: Wendy Woods             Date of Birth: May 07, 1960           MRN: [id number]             PCP: Fleet Contras, MD Referring: Fleet Contras, MD Visit Date: 11/10/2022 Occupation: @GUAROCC @  Subjective:  Medication monitoring   History of Present Illness: Wendy Woods is a 62 y.o. female with history of seropositive rheumatoid arthritis and osteoarthritis.  Patient remains on Rinvoq 15 mg 1 tablet by mouth daily, Arava 20 mg daily and plaquenil 200 mg 1 tablet BID M-F. Patient states she has been out of Rinvoq and plaquenil since the beginning of July and is currently having a flare in the right knee.  Patient states when she has been able to take her medications consistently she has noticed over 70% improvement in her joint pain and inflammation.  She states she is been able to increase her activity level and has had less difficulty performing ADLs.  She recently went fishing and is having some increased muscular pain.   She denies any recent or recurrent infections.      Activities of Daily Living:  Patient reports morning stiffness for 10 minutes.   Patient Reports nocturnal pain.  Difficulty dressing/grooming: Reports Difficulty climbing stairs: Reports Difficulty getting out of chair: Reports Difficulty using hands for taps, buttons, cutlery, and/or writing: Reports  Review of Systems  Constitutional:  Positive for fatigue.  HENT:  Negative for mouth sores and mouth dryness.   Eyes:  Positive for dryness.  Respiratory:  Negative for shortness of breath.   Cardiovascular:  Positive for chest pain. Negative for palpitations.  Gastrointestinal:  Negative for blood in stool, constipation and diarrhea.  Endocrine: Negative for increased urination.  Genitourinary:  Negative for involuntary urination.  Musculoskeletal:  Positive for joint pain, joint pain, joint swelling, myalgias, muscle weakness, morning stiffness, muscle tenderness and myalgias.  Negative for gait problem.  Skin:  Negative for color change, rash, hair loss and sensitivity to sunlight.  Allergic/Immunologic: Negative for susceptible to infections.  Neurological:  Negative for dizziness and headaches.  Hematological:  Negative for swollen glands.  Psychiatric/Behavioral:  Negative for depressed mood and sleep disturbance. The patient is not nervous/anxious.     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   Angioedema 07/15/2014   Hypokalemia 07/15/2014   Diabetes mellitus, controlled (HCC) 07/03/2012   HTN (hypertension) 07/03/2012   Rheumatoid arthritis (HCC) 07/03/2012   Current chronic use of systemic steroids 07/03/2012   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 07/03/2012    Past Medical History:  Diagnosis Date   Asthma    as a child   DDD (degenerative disc disease), cervical 03/22/2016   DDD (degenerative disc disease), lumbar 03/22/2016   GERD (gastroesophageal reflux disease)    Hyperlipidemia    Hypertension    Rheumatoid arthritis (HCC)    Rheumatoid arthritis(714.0)    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/22/2016   Seasonal allergies    Type II diabetes mellitus (HCC)     Family History  Problem Relation  Age of Onset   Diabetes Paternal Grandfather    Heart failure Paternal Grandfather    Hypertension Paternal Grandmother    Stroke Paternal Grandmother    Heart attack Maternal Grandmother    Tuberculosis Maternal Grandfather    Colon polyps Father    Prostate cancer Father    Hypertension Mother    Anxiety disorder Mother    Breast cancer Cousin 38   Colon cancer Neg Hx    Past Surgical History:   Procedure Laterality Date   APPENDECTOMY  1968   CATARACT EXTRACTION W/ INTRAOCULAR LENS  IMPLANT, BILATERAL Bilateral 11/2013-12/2013   KNEE ARTHROPLASTY     KNEE ARTHROSCOPY Left 1980's-1990's X 3   MYOMECTOMY  1990's   NASOPHARYNGEAL BIOPSY N/A 11/16/2021   Procedure: NASOPHARYNGEAL BIOPSY;  Surgeon: Christia Reading, MD;  Location: [facility] OR;  Service: ENT;  Laterality: N/A;   Social History   Social History Narrative   Right Handed   Lives in a two story home    Immunization History  Administered Date(s) Administered   PFIZER(Purple Top)SARS-COV-2 Vaccination 04/23/2019, 05/12/2019, 01/28/2020     Objective: Vital Signs: BP 129/83 (BP Location: Left Arm, Patient Position: Sitting, Cuff Size: Normal)   Pulse 61   Resp 15   Ht 5\' 4"  (1.626 m)   Wt 156 lb (70.8 kg)   LMP 07/09/2014   BMI 26.78 kg/m    Physical Exam Vitals and nursing note reviewed.  Constitutional:      Appearance: She is well-developed.  HENT:     Head: Normocephalic and atraumatic.  Eyes:     Conjunctiva/sclera: Conjunctivae normal.  Cardiovascular:     Rate and Rhythm: Normal rate and regular rhythm.     Heart sounds: Normal heart sounds.  Pulmonary:     Effort: Pulmonary effort is normal.     Breath sounds: Normal breath sounds.  Abdominal:     General: Bowel sounds are normal.     Palpations: Abdomen is soft.  Musculoskeletal:     Cervical back: Normal range of motion.  Lymphadenopathy:     Cervical: No cervical adenopathy.  Skin:    General: Skin is warm and dry.     Capillary Refill: Capillary refill takes less than 2 seconds.  Neurological:     Mental Status: She is alert and oriented to person, place, and time.  Psychiatric:        Behavior: Behavior normal.      Musculoskeletal Exam: C-spine has slightly limited range of motion without rotation.  Trapezius muscle tension and tenderness on the left side noted.  Shoulder joints and elbow joints have good range of motion.  Slightly  limited extension of both wrist joints but no tenderness or synovitis noted.  No tenderness or synovitis over MCP or PIP joints.  Complete fist formation bilaterally.  Hip joints have good range of motion with no groin pain.  Warmth and swelling noted in the right knee.  Left knee has good range of motion with no warmth or effusion.  Ankle joints have good range of motion with no tenderness or synovitis.  No tenderness or synovitis over MTP joints.  CDAI Exam: CDAI Score: 6  Patient Global: 20 / 100; Provider Global: 20 / 100 Swollen: 1 ; Tender: 1  Joint Exam 11/10/2022      Right  Left  Knee  Swollen Tender        Investigation: No additional findings.  Imaging: No results found.  Recent Labs: Lab Results  Component  Value Date   WBC 6.3 05/26/2022   HGB 12.2 05/26/2022   PLT 452 (H) 05/26/2022   NA 135 05/26/2022   K 4.4 05/26/2022   CL 93 (L) 05/26/2022   CO2 29 05/26/2022   GLUCOSE 568 (HH) 05/26/2022   BUN 12 05/26/2022   CREATININE 0.93 05/26/2022   BILITOT 0.2 05/26/2022   ALKPHOS 60 12/20/2021   AST 10 05/26/2022   ALT 11 05/26/2022   PROT 7.6 05/26/2022   ALBUMIN 3.5 12/20/2021   CALCIUM 9.9 05/26/2022   GFRAA >60 12/17/2019   QFTBGOLD Negative 09/18/2016   QFTBGOLDPLUS Negative 01/18/2022    Speciality Comments: Remicade 6mg / kg every 5 weeks-ACY 12/25/19 MTX -dcd 04/21 due to GI SEs  Procedures:  No procedures performed Allergies: Latex, Lisinopril, Penicillins, and Methotrexate derivatives   Assessment / Plan:     Visit Diagnoses: Rheumatoid arthritis involving multiple sites with positive rheumatoid factor (HCC) - + RF, Anti-CCP +, ANA negative: Patient presents today experiencing a flare in the right knee-warmth and swelling noted.  She has been out of her prescription for both Plaquenil and Rinvoq for about 1 week.  Patient states that she has noticed over 70% improvement in her joint pain and inflammation on triple therapy.  When she is able to  consistently take Arava 10 mg daily, Plaquenil 200 mg 1 tablet twice daily Monday through Friday, and Rinvoq 15 mg 1 tablet daily she has been able to increase her activity level and has had less difficulty with ADLs.  Patient states that this is the first flare she has had since initiating Rinvoq.  Refills of both Plaquenil and Rinvoq were sent to the pharmacy today.  Updated lab work including CBC, CMP, and lipid panel were obtained today.  Patient was advised to notify us if she starts to have recurrent flares.  She will remain on the current combination.  She will follow-up in the office in 3 months or sooner if needed. - Plan: Lipid panel, Upadacitinib ER (RINVOQ) 15 MG TB24  High risk medication use - Rinvoq 15 mg 1 tablet by mouth daily,  Arava 10 mg daily and plaquenil 200 mg 1 tablet BID M-F CBC and CMP updated on 05/26/22.  Orders for CBC and CMP were released today.  Her lab work will be due in October and every 3 months to monitor for drug toxicity. Patient was fasting today so a lipid panel was obtained. TB Gold negative on 01/18/22. Future order for TB Gold placed today. No baseline plaquenil eye exam on file.  Scheduled for an eye examination next Friday.  Patient was given an eye examination form to take with her to her upcoming appointment. No recent or recurrent infections. Discussed the importance of holding rinvoq and arava if she develops signs or symptoms of an infection and to resume once the infection has completely cleared.   - Plan: CBC with Differential/Platelet, COMPLETE METABOLIC PANEL WITH GFR, Lipid panel, QuantiFERON-TB Gold Plus, Upadacitinib ER (RINVOQ) 15 MG TB24  Screening for tuberculosis - Future order for TB gold placed today. 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   Primary osteoarthritis of both hands: No tenderness or synovitis noted. No difficulty performing ADLs. Complete fist formation noted  bilaterally.   Trochanteric bursitis, right hip: Not currently symptomatic.   Primary osteoarthritis of both knees: Patient presents today experiencing a flare in the right knee.  She has been out of her prescription for  Plaquenil and Rinvoq for about 1 week.  Refills were sent to the pharmacy today.   DDD (degenerative disc disease), cervical: Slight limited range of motion with the C-spine.  Trapezius muscle tension and tenderness noted especially on the left side.  She is also having some chest wall tenderness on the left side which she attributes to fishing this past weekend.  Discussed the use of a heating pad as well as topical agents to help with the muscular pain she is experiencing.  DDD (degenerative disc disease), lumbar: No midline spinal tenderness in the lumbar region.  Osteopenia of multiple sites - DEXA updated on 06/06/21: The BMD measured at Femur Neck Left is 0.843 g/cm2 with a T-score of -1.4.  Other medical conditions are listed as follows:  History of vitamin D deficiency  Essential hypertension: Blood pressure was 129/83 today in the office.  History of diabetes mellitus, type II - Not a good candidate for oral prednisone or intra-articular cortisone injections.  Anxiety    Orders: Orders Placed This Encounter  Procedures   CBC with Differential/Platelet   COMPLETE METABOLIC PANEL WITH GFR   Lipid panel   QuantiFERON-TB Gold Plus   Meds ordered this encounter  Medications   hydroxychloroquine (PLAQUENIL) 200 MG tablet    Sig: TAKE 1 TABLET BY MOUTH TWICE DAILY MONDAY THROUGH FRIDAY ONLY. NONE ON SATURDAY OR SUNDAY    Dispense:  120 tablet    Refill:  0   Upadacitinib ER (RINVOQ) 15 MG TB24    Sig: Take 1 tablet (15 mg total) by mouth daily.    Dispense:  90 tablet    Refill:  0      Follow-Up Instructions: Return in about 3 months (around 02/10/2023) for Rheumatoid arthritis.   Gearldine Bienenstock, PA-C  Note - This record has been created using  Dragon software.  Chart creation errors have been sought, but may not always  have been located. Such creation errors do not reflect on  the standard of medical care.

## 2022-11-10 ENCOUNTER — Encounter: Payer: Self-pay | Admitting: Physician Assistant

## 2022-11-10 ENCOUNTER — Other Ambulatory Visit (HOSPITAL_COMMUNITY): Payer: Self-pay

## 2022-11-10 ENCOUNTER — Ambulatory Visit: Payer: 59 | Attending: Physician Assistant | Admitting: Physician Assistant

## 2022-11-10 ENCOUNTER — Other Ambulatory Visit: Payer: Self-pay

## 2022-11-10 VITALS — BP 129/83 | HR 61 | Resp 15 | Ht 64.0 in | Wt 156.0 lb

## 2022-11-10 DIAGNOSIS — F419 Anxiety disorder, unspecified: Secondary | ICD-10-CM

## 2022-11-10 DIAGNOSIS — M5136 Other intervertebral disc degeneration, lumbar region: Secondary | ICD-10-CM

## 2022-11-10 DIAGNOSIS — M0579 Rheumatoid arthritis with rheumatoid factor of multiple sites without organ or systems involvement: Secondary | ICD-10-CM

## 2022-11-10 DIAGNOSIS — M8589 Other specified disorders of bone density and structure, multiple sites: Secondary | ICD-10-CM

## 2022-11-10 DIAGNOSIS — Z111 Encounter for screening for respiratory tuberculosis: Secondary | ICD-10-CM

## 2022-11-10 DIAGNOSIS — M19041 Primary osteoarthritis, right hand: Secondary | ICD-10-CM | POA: Diagnosis not present

## 2022-11-10 DIAGNOSIS — H15003 Unspecified scleritis, bilateral: Secondary | ICD-10-CM

## 2022-11-10 DIAGNOSIS — I10 Essential (primary) hypertension: Secondary | ICD-10-CM

## 2022-11-10 DIAGNOSIS — M5030 Other cervical disc degeneration, unspecified cervical region: Secondary | ICD-10-CM

## 2022-11-10 DIAGNOSIS — Z8639 Personal history of other endocrine, nutritional and metabolic disease: Secondary | ICD-10-CM

## 2022-11-10 DIAGNOSIS — M51369 Other intervertebral disc degeneration, lumbar region without mention of lumbar back pain or lower extremity pain: Secondary | ICD-10-CM

## 2022-11-10 DIAGNOSIS — M7061 Trochanteric bursitis, right hip: Secondary | ICD-10-CM

## 2022-11-10 DIAGNOSIS — Z79899 Other long term (current) drug therapy: Secondary | ICD-10-CM

## 2022-11-10 DIAGNOSIS — M19042 Primary osteoarthritis, left hand: Secondary | ICD-10-CM

## 2022-11-10 DIAGNOSIS — M170 Bilateral primary osteoarthritis of knee: Secondary | ICD-10-CM

## 2022-11-10 LAB — CBC WITH DIFFERENTIAL/PLATELET
Absolute Monocytes: 465 cells/uL (ref 200–950)
Basophils Absolute: 28 cells/uL (ref 0–200)
Eosinophils Absolute: 151 cells/uL (ref 15–500)
Eosinophils Relative: 2.7 %
HCT: 33.5 % — ABNORMAL LOW (ref 35.0–45.0)
MCH: 29.5 pg (ref 27.0–33.0)
MCHC: 33.7 g/dL (ref 32.0–36.0)
MCV: 87.5 fL (ref 80.0–100.0)
Monocytes Relative: 8.3 %
Neutro Abs: 3130 cells/uL (ref 1500–7800)
RBC: 3.83 10*6/uL (ref 3.80–5.10)
RDW: 14 % (ref 11.0–15.0)
Total Lymphocyte: 32.6 %
WBC: 5.6 10*3/uL (ref 3.8–10.8)

## 2022-11-10 MED ORDER — HYDROXYCHLOROQUINE SULFATE 200 MG PO TABS: ORAL_TABLET | ORAL | 0 refills | Status: DC

## 2022-11-10 MED ORDER — RINVOQ 15 MG PO TB24
15.0000 mg | ORAL_TABLET | Freq: Every day | ORAL | 0 refills | Status: DC
  Filled 2022-11-10: qty 30, 30d supply, fill #0
  Filled 2022-12-04 (×2): qty 30, 30d supply, fill #1
  Filled 2022-12-21: qty 30, 30d supply, fill #2

## 2022-11-10 NOTE — Patient Instructions (Signed)
Standing Labs We placed an order today for your standing lab work.   Please have your standing labs drawn in October and every 3 months   Please have your labs drawn 2 weeks prior to your appointment so that the provider can discuss your lab results at your appointment, if possible.  Please note that you may see your imaging and lab results in MyChart before we have reviewed them. We will contact you once all results are reviewed. Please allow our office up to 72 hours to thoroughly review all of the results before contacting the office for clarification of your results.  WALK-IN LAB HOURS  Monday through Thursday from 8:00 am -12:30 pm and 1:00 pm-5:00 pm and Friday from 8:00 am-12:00 pm.  Patients with office visits requiring labs will be seen before walk-in labs.  You may encounter longer than normal wait times. Please allow additional time. Wait times may be shorter on  Monday and Thursday afternoons.  We do not book appointments for walk-in labs. We appreciate your patience and understanding with our staff.   Labs are drawn by [facility]. Please bring your co-pay at the time of your lab draw.  You may receive a bill from [facility] for your lab work.  Please note if you are on Hydroxychloroquine and and an order has been placed for a Hydroxychloroquine level,  you will need to have it drawn 4 hours or more after your last dose.  If you wish to have your labs drawn at another location, please call the office 24 hours in advance so we can fax the orders.  The office is located at [address], [address], [place], [place] [address]   If you have any questions regarding directions or hours of operation,  please call [phone].   As a reminder, please drink plenty of water prior to coming for your lab work. Thanks!  

## 2022-11-11 LAB — COMPLETE METABOLIC PANEL WITH GFR
AG Ratio: 1.2 (calc) (ref 1.0–2.5)
ALT: 9 U/L (ref 6–29)
AST: 11 U/L (ref 10–35)
Albumin: 4.1 g/dL (ref 3.6–5.1)
Alkaline phosphatase (APISO): 49 U/L (ref 37–153)
BUN: 14 mg/dL (ref 7–25)
CO2: 31 mmol/L (ref 20–32)
Calcium: 9.8 mg/dL (ref 8.6–10.4)
Chloride: 100 mmol/L (ref 98–110)
Creat: 0.81 mg/dL (ref 0.50–1.05)
Globulin: 3.5 g/dL (calc) (ref 1.9–3.7)
Glucose, Bld: 133 mg/dL — ABNORMAL HIGH (ref 65–99)
Potassium: 4.2 mmol/L (ref 3.5–5.3)
Sodium: 139 mmol/L (ref 135–146)
Total Bilirubin: 0.2 mg/dL (ref 0.2–1.2)
Total Protein: 7.6 g/dL (ref 6.1–8.1)
eGFR: 83 mL/min/{1.73_m2} (ref >=60)

## 2022-11-11 LAB — CBC WITH DIFFERENTIAL/PLATELET
Basophils Relative: 0.5 %
Hemoglobin: 11.3 g/dL — ABNORMAL LOW (ref 11.7–15.5)
Lymphs Abs: 1826 cells/uL (ref 850–3900)
MPV: 9.5 fL (ref 7.5–12.5)
Neutrophils Relative %: 55.9 %
Platelets: 455 10*3/uL — ABNORMAL HIGH (ref 140–400)

## 2022-11-11 LAB — LIPID PANEL
Cholesterol: 245 mg/dL — ABNORMAL HIGH (ref <200)
HDL: 61 mg/dL (ref >=50)
LDL Cholesterol (Calc): 156 mg/dL (calc) — ABNORMAL HIGH
Non-HDL Cholesterol (Calc): 184 mg/dL (calc) — ABNORMAL HIGH (ref <130)
Total CHOL/HDL Ratio: 4 (calc) (ref <5.0)
Triglycerides: 149 mg/dL (ref <150)

## 2022-11-12 ENCOUNTER — Other Ambulatory Visit: Payer: Self-pay | Admitting: Physician Assistant

## 2022-11-13 NOTE — Progress Notes (Signed)
Glucose is 133. Rest of CMP WNL.    Hemoglobin and hematocrit are low.   Plt count remains elevated-stable. We will continue to monitor.     Total cholesterol is elevated-245.  LFL is elevated-156.  HDL WNL and triglycerides WNL.  Please forward results to PCP as requested.

## 2022-11-28 ENCOUNTER — Telehealth: Payer: Self-pay | Admitting: *Deleted

## 2022-11-28 NOTE — Telephone Encounter (Signed)
Received fax from [facility] for [name] eye exam. Per report patient has zero signs of toxicity on OCT but visual field is unreliable. Patient advised per Dr. Corliss Skains to discontinue [name]. Patient expressed understanding. Patient will call the office if she begins to have flares.

## 2022-11-30 ENCOUNTER — Other Ambulatory Visit (HOSPITAL_COMMUNITY): Payer: Self-pay

## 2022-12-04 ENCOUNTER — Other Ambulatory Visit: Payer: Self-pay

## 2022-12-04 ENCOUNTER — Other Ambulatory Visit (HOSPITAL_COMMUNITY): Payer: Self-pay

## 2022-12-05 ENCOUNTER — Other Ambulatory Visit: Payer: Self-pay

## 2022-12-08 ENCOUNTER — Other Ambulatory Visit (HOSPITAL_COMMUNITY): Payer: Self-pay

## 2022-12-18 ENCOUNTER — Other Ambulatory Visit: Payer: Self-pay | Admitting: Internal Medicine

## 2022-12-19 ENCOUNTER — Other Ambulatory Visit (HOSPITAL_COMMUNITY): Payer: Self-pay

## 2022-12-19 LAB — COMPLETE METABOLIC PANEL WITH GFR
AG Ratio: 1.2 (calc) (ref 1.0–2.5)
ALT: 13 U/L (ref 6–29)
AST: 14 U/L (ref 10–35)
Albumin: 4.1 g/dL (ref 3.6–5.1)
Alkaline phosphatase (APISO): 48 U/L (ref 37–153)
BUN: 13 mg/dL (ref 7–25)
CO2: 24 mmol/L (ref 20–32)
Calcium: 9.6 mg/dL (ref 8.6–10.4)
Chloride: 99 mmol/L (ref 98–110)
Creat: 0.8 mg/dL (ref 0.50–1.05)
Globulin: 3.3 g/dL (ref 1.9–3.7)
Glucose, Bld: 247 mg/dL — ABNORMAL HIGH (ref 65–99)
Potassium: 4 mmol/L (ref 3.5–5.3)
Sodium: 136 mmol/L (ref 135–146)
Total Bilirubin: 0.3 mg/dL (ref 0.2–1.2)
Total Protein: 7.4 g/dL (ref 6.1–8.1)
eGFR: 84 mL/min/{1.73_m2} (ref >=60)

## 2022-12-19 LAB — LIPID PANEL
Cholesterol: 295 mg/dL — ABNORMAL HIGH (ref <200)
HDL: 78 mg/dL (ref >=50)
LDL Cholesterol (Calc): 182 mg/dL — ABNORMAL HIGH
Non-HDL Cholesterol (Calc): 217 mg/dL — ABNORMAL HIGH (ref <130)
Total CHOL/HDL Ratio: 3.8 (calc) (ref <5.0)
Triglycerides: 190 mg/dL — ABNORMAL HIGH (ref <150)

## 2022-12-19 LAB — CBC
HCT: 31.4 % — ABNORMAL LOW (ref 35.0–45.0)
Hemoglobin: 10.6 g/dL — ABNORMAL LOW (ref 11.7–15.5)
MCH: 30.1 pg (ref 27.0–33.0)
MCHC: 33.8 g/dL (ref 32.0–36.0)
MCV: 89.2 fL (ref 80.0–100.0)
MPV: 9.4 fL (ref 7.5–12.5)
Platelets: 378 10*3/uL (ref 140–400)
RBC: 3.52 10*6/uL — ABNORMAL LOW (ref 3.80–5.10)
RDW: 13.5 % (ref 11.0–15.0)
WBC: 6.1 10*3/uL (ref 3.8–10.8)

## 2022-12-19 LAB — TSH: TSH: 1.32 m[IU]/L (ref 0.40–4.50)

## 2022-12-19 LAB — VITAMIN B12: Vitamin B-12: 345 pg/mL (ref 200–1100)

## 2022-12-19 LAB — VITAMIN D 25 HYDROXY (VIT D DEFICIENCY, FRACTURES): Vit D, 25-Hydroxy: 25 ng/mL — ABNORMAL LOW (ref 30–100)

## 2022-12-19 LAB — FOLATE: Folate: >24 ng/mL

## 2022-12-21 ENCOUNTER — Other Ambulatory Visit (HOSPITAL_COMMUNITY): Payer: Self-pay

## 2022-12-21 ENCOUNTER — Other Ambulatory Visit: Payer: Self-pay | Admitting: Medical Genetics

## 2022-12-21 ENCOUNTER — Other Ambulatory Visit: Payer: Self-pay

## 2022-12-21 DIAGNOSIS — Z006 Encounter for examination for normal comparison and control in clinical research program: Secondary | ICD-10-CM

## 2022-12-22 ENCOUNTER — Other Ambulatory Visit: Payer: Self-pay | Admitting: Internal Medicine

## 2022-12-22 DIAGNOSIS — Z1231 Encounter for screening mammogram for malignant neoplasm of breast: Secondary | ICD-10-CM

## 2022-12-26 ENCOUNTER — Other Ambulatory Visit: Payer: Self-pay | Admitting: Internal Medicine

## 2022-12-26 DIAGNOSIS — E2839 Other primary ovarian failure: Secondary | ICD-10-CM

## 2023-01-05 ENCOUNTER — Telehealth: Payer: Self-pay | Admitting: Pharmacist

## 2023-01-05 NOTE — Telephone Encounter (Signed)
Received notification from [facility] regarding a prior authorization for [facility]. Authorization has been APPROVED from 01/05/23 to 05/01/23. Approval letter sent to scan center.  Patient can continue to fill through [facility]: [phone]   Authorization # [id number] Phone # [phone]  Chesley Mires, PharmD, MPH, BCPS, CPP Clinical Pharmacist (Rheumatology and Pulmonology)

## 2023-01-05 NOTE — Telephone Encounter (Signed)
Submitted a Prior Authorization renewal request to [facility] for RINVOQ via CoverMyMeds. Will update once we receive a response.  Key: [id number]  Chesley Mires, PharmD, MPH, BCPS, CPP Clinical Pharmacist (Rheumatology and Pulmonology)

## 2023-01-08 ENCOUNTER — Other Ambulatory Visit (HOSPITAL_COMMUNITY): Payer: Self-pay

## 2023-01-10 ENCOUNTER — Other Ambulatory Visit (HOSPITAL_COMMUNITY): Payer: Self-pay

## 2023-01-10 ENCOUNTER — Other Ambulatory Visit: Payer: Self-pay

## 2023-01-10 ENCOUNTER — Other Ambulatory Visit: Payer: Self-pay | Admitting: Physician Assistant

## 2023-01-10 DIAGNOSIS — M0579 Rheumatoid arthritis with rheumatoid factor of multiple sites without organ or systems involvement: Secondary | ICD-10-CM

## 2023-01-10 DIAGNOSIS — Z79899 Other long term (current) drug therapy: Secondary | ICD-10-CM

## 2023-01-11 ENCOUNTER — Other Ambulatory Visit: Payer: Self-pay | Admitting: Pharmacist

## 2023-01-11 ENCOUNTER — Other Ambulatory Visit (HOSPITAL_COMMUNITY): Payer: Self-pay

## 2023-01-11 DIAGNOSIS — M0579 Rheumatoid arthritis with rheumatoid factor of multiple sites without organ or systems involvement: Secondary | ICD-10-CM

## 2023-01-11 DIAGNOSIS — Z79899 Other long term (current) drug therapy: Secondary | ICD-10-CM

## 2023-01-11 MED ORDER — RINVOQ 15 MG PO TB24
15.0000 mg | ORAL_TABLET | Freq: Every day | ORAL | 0 refills | Status: DC
Start: 2023-01-11 — End: 2023-03-20
  Filled 2023-01-11: qty 30, 30d supply, fill #0
  Filled 2023-02-19: qty 30, 30d supply, fill #1

## 2023-01-11 NOTE — Telephone Encounter (Signed)
Received message from [facility] requesting refill for Rinvoq that was denied  CBC and CMP were updated on 12/18/22. CBC stable and CMET stable to continue Rinvoq 15mg  daily with leflunomide 10mg  daily  Chesley Mires, PharmD, MPH, BCPS, CPP Clinical Pharmacist (Rheumatology and Pulmonology)

## 2023-01-23 ENCOUNTER — Ambulatory Visit
Admission: RE | Admit: 2023-01-23 | Discharge: 2023-01-23 | Disposition: A | Discharge status: Home / Self Care | Payer: 59 | Source: Ambulatory Visit | Attending: Internal Medicine | Admitting: Internal Medicine

## 2023-01-23 DIAGNOSIS — Z1231 Encounter for screening mammogram for malignant neoplasm of breast: Secondary | ICD-10-CM

## 2023-01-26 NOTE — Progress Notes (Signed)
Office Visit Note  Patient: Wendy Woods             Date of Birth: October 07, 1960           MRN: [id number]             PCP: Fleet Contras, MD Referring: Fleet Contras, MD Visit Date: 02/09/2023 Occupation: @GUAROCC @  Subjective:  Right shoulder joint pain   History of Present Illness: Wendy Woods is a 62 y.o. female with history of seropositive rheumatoid arthritis and osteoarthritis.  Patient remains on Rinvoq 15 mg 1 tablet by mouth daily and Arava 10 mg daily.  She is tolerating combination therapy without any side effects and has not had any interruptions in therapy.  Patient has noticed a significant improvement in her symptoms since initiating rinvoq.  She has been able to increase her activity level and was able to fish twice a week this summer.  She states that she has had more ease going up and down steps at home and denies any difficulty rising from a seated position.  The swelling in her knees and wrists has resolved.  She is having some increased discomfort in her right shoulder x 1 week.  She states she was lifting her mother's related walker and out of the car which may have exacerbated the discomfort.  She has been using topical agents.     Activities of Daily Living:  Patient reports morning stiffness for 10 minutes.   Patient Reports nocturnal pain.  Difficulty dressing/grooming: Reports Difficulty climbing stairs: Denies Difficulty getting out of chair: Denies Difficulty using hands for taps, buttons, cutlery, and/or writing: Reports  Review of Systems  Constitutional:  Positive for fatigue.  HENT:  Positive for mouth dryness. Negative for mouth sores.   Eyes:  Positive for dryness.  Respiratory:  Negative for shortness of breath.   Cardiovascular:  Negative for chest pain and palpitations.  Gastrointestinal:  Negative for blood in stool, constipation and diarrhea.  Endocrine: Negative for increased urination.  Genitourinary:  Negative for involuntary  urination.  Musculoskeletal:  Positive for joint pain, joint pain, joint swelling, myalgias, morning stiffness, muscle tenderness and myalgias. Negative for gait problem and muscle weakness.  Skin:  Negative for color change, rash, hair loss and sensitivity to sunlight.  Allergic/Immunologic: Negative for susceptible to infections.  Neurological:  Positive for headaches. Negative for dizziness.  Hematological:  Negative for swollen glands.  Psychiatric/Behavioral:  Positive for sleep disturbance. Negative for depressed mood. The patient is not nervous/anxious.     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   Angioedema 07/15/2014   Hypokalemia 07/15/2014   Diabetes mellitus, controlled (HCC) 07/03/2012   HTN (hypertension) 07/03/2012   Rheumatoid arthritis (HCC) 07/03/2012   Current chronic use of systemic steroids 07/03/2012   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 07/03/2012    Past Medical History:  Diagnosis Date   Asthma    as a child   DDD (degenerative disc disease), cervical 03/22/2016   DDD (degenerative disc disease), lumbar 03/22/2016   GERD (gastroesophageal reflux disease)    Hyperlipidemia    Hypertension  Rheumatoid arthritis (HCC)    Rheumatoid arthritis(714.0)    Scleritis 03/22/2016   Seasonal allergies    Type II diabetes mellitus (HCC)     Family History  Problem Relation Age of Onset   Diabetes Paternal Grandfather    Heart failure Paternal Grandfather    Hypertension Paternal Grandmother    Stroke Paternal Grandmother    Heart attack Maternal Grandmother    Tuberculosis Maternal Grandfather     Colon polyps Father    Prostate cancer Father    Hypertension Mother    Anxiety disorder Mother    Breast cancer Cousin 90   Colon cancer Neg Hx    Past Surgical History:  Procedure Laterality Date   APPENDECTOMY  1968   CATARACT EXTRACTION W/ INTRAOCULAR LENS  IMPLANT, BILATERAL Bilateral 11/2013-12/2013   KNEE ARTHROPLASTY     KNEE ARTHROSCOPY Left 1980's-1990's X 3   MYOMECTOMY  1990's   NASOPHARYNGEAL BIOPSY N/A 11/16/2021   Procedure: NASOPHARYNGEAL BIOPSY;  Surgeon: Christia Reading, MD;  Location: [facility] OR;  Service: ENT;  Laterality: N/A;   Social History   Social History Narrative   Right Handed   Lives in a two story home    Immunization History  Administered Date(s) Administered   PFIZER(Purple Top)SARS-COV-2 Vaccination 04/23/2019, 05/12/2019, 01/28/2020     Objective: Vital Signs: BP 122/79 (BP Location: Left Arm, Patient Position: Sitting, Cuff Size: Normal)   Pulse (!) 57   Resp 15   Ht 5\' 4"  (1.626 m)   Wt 166 lb 6.4 oz (75.5 kg)   LMP 07/09/2014   BMI 28.56 kg/m    Physical Exam Vitals and nursing note reviewed.  Constitutional:      Appearance: She is well-developed.  HENT:     Head: Normocephalic and atraumatic.  Eyes:     Conjunctiva/sclera: Conjunctivae normal.  Cardiovascular:     Rate and Rhythm: Normal rate and regular rhythm.     Heart sounds: Normal heart sounds.  Pulmonary:     Effort: Pulmonary effort is normal.     Breath sounds: Normal breath sounds.  Abdominal:     General: Bowel sounds are normal.     Palpations: Abdomen is soft.  Musculoskeletal:     Cervical back: Normal range of motion.  Lymphadenopathy:     Cervical: No cervical adenopathy.  Skin:    General: Skin is warm and dry.     Capillary Refill: Capillary refill takes less than 2 seconds.  Neurological:     Mental Status: She is alert and oriented to person, place, and time.  Psychiatric:        Behavior: Behavior normal.      Musculoskeletal Exam: C-spine has  limited range of motion with lateral rotation.  Painful range of motion of the right shoulder with limited abduction.  Left shoulder is full range of motion.  Elbow joints have good range of motion.  Limited extension of both wrist joints but no tenderness or synovitis noted.  No tenderness or synovitis over MCP joints.  Some tenderness of the left fourth PIP joint.  Complete fist formation noted bilaterally.  Hip joints have good range of motion with no groin pain.  Knee joints have good range of motion with no warmth or effusion.  Ankle joints have good range of motion with no tenderness or joint swelling.  CDAI Exam: CDAI Score: 9  Patient Global: 50 / 100; Provider Global: 20 / 100 Swollen: 0 ; Tender: 2  Joint Exam 02/09/2023  Right  Left  Glenohumeral   Tender     PIP 4 (finger)      Tender     Investigation: No additional findings.  Imaging: MM 3D SCREENING MAMMOGRAM BILATERAL BREAST  Result Date: 01/24/2023 CLINICAL DATA:  Screening. EXAM: DIGITAL SCREENING BILATERAL MAMMOGRAM WITH TOMOSYNTHESIS AND CAD TECHNIQUE: Bilateral screening digital craniocaudal and mediolateral oblique mammograms were obtained. Bilateral screening digital breast tomosynthesis was performed. The images were evaluated with computer-aided detection. COMPARISON:  Previous exam(s). ACR Breast Density Category c: The breasts are heterogeneously dense, which may obscure small masses. FINDINGS: There are no findings suspicious for malignancy. IMPRESSION: No mammographic evidence of malignancy. A result letter of this screening mammogram will be mailed directly to the patient. RECOMMENDATION: Screening mammogram in one year. (Code:SM-B-01Y) BI-RADS CATEGORY  1: Negative. Electronically Signed   By: Edwin Cap M.D.   On: 01/24/2023 15:30    Recent Labs: Lab Results  Component Value Date   WBC 6.1 12/18/2022   HGB 10.6 (L) 12/18/2022   PLT 378 12/18/2022   NA 136 12/18/2022   K 4.0 12/18/2022   CL 99  12/18/2022   CO2 24 12/18/2022   GLUCOSE 247 (H) 12/18/2022   BUN 13 12/18/2022   CREATININE 0.80 12/18/2022   BILITOT 0.3 12/18/2022   ALKPHOS 60 12/20/2021   AST 14 12/18/2022   ALT 13 12/18/2022   PROT 7.4 12/18/2022   ALBUMIN 3.5 12/20/2021   CALCIUM 9.6 12/18/2022   GFRAA >60 12/17/2019   QFTBGOLD Negative 09/18/2016   QFTBGOLDPLUS Negative 01/18/2022    Speciality Comments: Remicade 6mg / kg every 5 weeks-ACY 12/25/19 MTX -dcd 04/21 due to GI SEs  Procedures:  No procedures performed Allergies: Latex, Lisinopril, Penicillins, and Methotrexate derivatives   Assessment / Plan:     Visit Diagnoses: Rheumatoid arthritis involving multiple sites with positive rheumatoid factor (HCC) - + RF, Anti-CCP +, ANA negative: She has no synovitis on examination today.  She has not had any signs or symptoms of a rheumatoid arthritis flare.  She continues to have occasional arthralgias but her symptoms have been significantly more tolerable.  She has been able to increase her activity level without difficulty.  During the summer she was able to fish twice a week without difficulty.  She has not had any difficulty rising from a seated position or climbing steps.  The inflammation in her wrist and knee joints has resolved.  She has clinically been doing well taking Rinvoq 15 mg 1 tablet by mouth daily and Arava 10 mg daily.  She is tolerating combination therapy without any side effects and has not missed any doses recently.  She has not had any recent or recurrent infections.  She will remain on combination therapy as prescribed.  She was advised to notify us if she develops signs or symptoms of a flare.  She will follow-up in the office in 3 months or sooner if needed.  High risk medication use - Rinvoq 15 mg 1 tablet by mouth daily,  Arava 10 mg daily. (d/c [name] on 11/28/2022 due to unreliable visual field). CBC and CMP updated on 12/18/22.  Orders for CBC and CMP were released today. Lipid panel  updated on 12/18/22.    TB gold negative on 01/18/22.  Order for TB gold released today. No recent or recurrent infections.  Discussed the importance of holding rinvoq and arava if she develops signs or symptoms of an infection and to resume once the infection has completely cleared.   Patient  is planning on receiving the annual flu shot this month.  - Plan: COMPLETE METABOLIC PANEL WITH GFR, CBC with Differential/Platelet, QuantiFERON-TB Gold Plus  Screening for tuberculosis - Order for TB gold released today. 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  Primary osteoarthritis of both hands: Patient presents today with some tenderness over the left fourth PIP joint.  She has no synovitis on examination today.  She was able to make a complete fist bilaterally.  Trochanteric bursitis, right hip: Not currently symptomatic.   Primary osteoarthritis of both knees: She has good range of motion of both knee joints on examination today.  No warmth or effusion noted.  She has been able to go up and down her steps with ease.  She has not had any difficulty rising from a seated position.  Acute pain of right shoulder: Patient presents today with some increased discomfort involving the right shoulder x 1 week.  No injury.  She feels that her symptoms are exacerbated by lifting her mother's rollator walker in and out of the car.  She has tried using topical agents for pain relief.  Encourage patient to perform range of motion exercises.  She was advised to notify us if her symptoms persist or worsen at which time a referral to physical therapy can be placed.  DDD (degenerative disc disease), cervical: C-spine is limited range of motion without rotation.  DDD (degenerative disc disease), lumbar: She is not experiencing any increased discomfort in her lower back at this time.  Osteopenia of multiple sites - DEXA updated on 06/06/21: The BMD measured  at Femur Neck Left is 0.843 g/cm2 with a T-score of -1.4.  History of vitamin D deficiency  Other medical conditions are listed as follows:  Essential hypertension: Blood pressure was 122/79 today in the office.  History of diabetes mellitus, type II - Not a good candidate for oral prednisone or intra-articular cortisone injections.  Anxiety    Orders: Orders Placed This Encounter  Procedures   COMPLETE METABOLIC PANEL WITH GFR   CBC with Differential/Platelet   QuantiFERON-TB Gold Plus   No orders of the defined types were placed in this encounter.   Follow-Up Instructions: Return in about 3 months (around 05/12/2023) for Rheumatoid arthritis, Osteoarthritis.   Gearldine Bienenstock, PA-C  Note - This record has been created using Dragon software.  Chart creation errors have been sought, but may not always  have been located. Such creation errors do not reflect on  the standard of medical care.

## 2023-02-09 ENCOUNTER — Ambulatory Visit: Payer: 59 | Attending: Physician Assistant | Admitting: Physician Assistant

## 2023-02-09 ENCOUNTER — Encounter: Payer: Self-pay | Admitting: Physician Assistant

## 2023-02-09 VITALS — BP 122/79 | HR 57 | Resp 15 | Ht 64.0 in | Wt 166.4 lb

## 2023-02-09 DIAGNOSIS — Z79899 Other long term (current) drug therapy: Secondary | ICD-10-CM | POA: Diagnosis not present

## 2023-02-09 DIAGNOSIS — M19041 Primary osteoarthritis, right hand: Secondary | ICD-10-CM

## 2023-02-09 DIAGNOSIS — H15003 Unspecified scleritis, bilateral: Secondary | ICD-10-CM

## 2023-02-09 DIAGNOSIS — F419 Anxiety disorder, unspecified: Secondary | ICD-10-CM

## 2023-02-09 DIAGNOSIS — M19042 Primary osteoarthritis, left hand: Secondary | ICD-10-CM

## 2023-02-09 DIAGNOSIS — Z8639 Personal history of other endocrine, nutritional and metabolic disease: Secondary | ICD-10-CM

## 2023-02-09 DIAGNOSIS — M0579 Rheumatoid arthritis with rheumatoid factor of multiple sites without organ or systems involvement: Secondary | ICD-10-CM | POA: Diagnosis not present

## 2023-02-09 DIAGNOSIS — Z111 Encounter for screening for respiratory tuberculosis: Secondary | ICD-10-CM

## 2023-02-09 DIAGNOSIS — M170 Bilateral primary osteoarthritis of knee: Secondary | ICD-10-CM

## 2023-02-09 DIAGNOSIS — M8589 Other specified disorders of bone density and structure, multiple sites: Secondary | ICD-10-CM

## 2023-02-09 DIAGNOSIS — M7061 Trochanteric bursitis, right hip: Secondary | ICD-10-CM

## 2023-02-09 DIAGNOSIS — M51369 Other intervertebral disc degeneration, lumbar region without mention of lumbar back pain or lower extremity pain: Secondary | ICD-10-CM

## 2023-02-09 DIAGNOSIS — I10 Essential (primary) hypertension: Secondary | ICD-10-CM

## 2023-02-09 DIAGNOSIS — M5030 Other cervical disc degeneration, unspecified cervical region: Secondary | ICD-10-CM

## 2023-02-09 DIAGNOSIS — M25511 Pain in right shoulder: Secondary | ICD-10-CM

## 2023-02-09 DIAGNOSIS — M5136 Other intervertebral disc degeneration, lumbar region: Secondary | ICD-10-CM

## 2023-02-09 NOTE — Patient Instructions (Signed)
Standing Labs We placed an order today for your standing lab work.   Please have your standing labs drawn in January and every 3 months   Please have your labs drawn 2 weeks prior to your appointment so that the provider can discuss your lab results at your appointment, if possible.  Please note that you may see your imaging and lab results in MyChart before we have reviewed them. We will contact you once all results are reviewed. Please allow our office up to 72 hours to thoroughly review all of the results before contacting the office for clarification of your results.  WALK-IN LAB HOURS  Monday through Thursday from 8:00 am -12:30 pm and 1:00 pm-5:00 pm and Friday from 8:00 am-12:00 pm.  Patients with office visits requiring labs will be seen before walk-in labs.  You may encounter longer than normal wait times. Please allow additional time. Wait times may be shorter on  Monday and Thursday afternoons.  We do not book appointments for walk-in labs. We appreciate your patience and understanding with our staff.   Labs are drawn by [facility]. Please bring your co-pay at the time of your lab draw.  You may receive a bill from [facility] for your lab work.  Please note if you are on Hydroxychloroquine and and an order has been placed for a Hydroxychloroquine level,  you will need to have it drawn 4 hours or more after your last dose.  If you wish to have your labs drawn at another location, please call the office 24 hours in advance so we can fax the orders.  The office is located at [address], [address], [place], [name] [address]   If you have any questions regarding directions or hours of operation,  please call [phone].   As a reminder, please drink plenty of water prior to coming for your lab work. Thanks!

## 2023-02-12 LAB — COMPLETE METABOLIC PANEL WITH GFR
AG Ratio: 1.2 (calc) (ref 1.0–2.5)
ALT: 12 U/L (ref 6–29)
AST: 12 U/L (ref 10–35)
Albumin: 4.2 g/dL (ref 3.6–5.1)
Alkaline phosphatase (APISO): 57 U/L (ref 37–153)
BUN: 13 mg/dL (ref 7–25)
CO2: 32 mmol/L (ref 20–32)
Calcium: 10.2 mg/dL (ref 8.6–10.4)
Chloride: 98 mmol/L (ref 98–110)
Creat: 0.84 mg/dL (ref 0.50–1.05)
Globulin: 3.4 g/dL (ref 1.9–3.7)
Glucose, Bld: 218 mg/dL — ABNORMAL HIGH (ref 65–99)
Potassium: 4 mmol/L (ref 3.5–5.3)
Sodium: 140 mmol/L (ref 135–146)
Total Bilirubin: 0.3 mg/dL (ref 0.2–1.2)
Total Protein: 7.6 g/dL (ref 6.1–8.1)
eGFR: 79 mL/min/{1.73_m2} (ref >=60)

## 2023-02-12 LAB — CBC WITH DIFFERENTIAL/PLATELET
Absolute Monocytes: 402 {cells}/uL (ref 200–950)
Basophils Absolute: 37 {cells}/uL (ref 0–200)
Basophils Relative: 0.5 %
Eosinophils Absolute: 153 {cells}/uL (ref 15–500)
Eosinophils Relative: 2.1 %
HCT: 34.9 % — ABNORMAL LOW (ref 35.0–45.0)
Hemoglobin: 11.3 g/dL — ABNORMAL LOW (ref 11.7–15.5)
Lymphs Abs: 1796 {cells}/uL (ref 850–3900)
MCH: 29.1 pg (ref 27.0–33.0)
MCHC: 32.4 g/dL (ref 32.0–36.0)
MCV: 89.9 fL (ref 80.0–100.0)
MPV: 9.4 fL (ref 7.5–12.5)
Monocytes Relative: 5.5 %
Neutro Abs: 4913 {cells}/uL (ref 1500–7800)
Neutrophils Relative %: 67.3 %
Platelets: 443 10*3/uL — ABNORMAL HIGH (ref 140–400)
RBC: 3.88 10*6/uL (ref 3.80–5.10)
RDW: 13.2 % (ref 11.0–15.0)
Total Lymphocyte: 24.6 %
WBC: 7.3 10*3/uL (ref 3.8–10.8)

## 2023-02-12 LAB — QUANTIFERON-TB GOLD PLUS
Mitogen-NIL: 1.79 [IU]/mL
NIL: 0.01 [IU]/mL
QuantiFERON-TB Gold Plus: NEGATIVE
TB1-NIL: 0 [IU]/mL
TB2-NIL: 0.01 [IU]/mL

## 2023-02-12 NOTE — Progress Notes (Signed)
Glucose is elevated-218. Rest of CMP WNL.  Anemia has improved. Platelet count is slightly elevated. Rest of CBC WNL. We will continue to monitor.

## 2023-02-13 ENCOUNTER — Other Ambulatory Visit: Payer: Self-pay

## 2023-02-13 NOTE — Progress Notes (Signed)
TB gold negative

## 2023-02-19 ENCOUNTER — Other Ambulatory Visit: Payer: Self-pay

## 2023-02-19 NOTE — Progress Notes (Signed)
Specialty Pharmacy Refill Coordination Note  Wendy Woods is a 62 y.o. female contacted today regarding refills of specialty medication(s) No data recorded  Patient requested Delivery   Delivery date: 02/28/23   Verified address: [address] [place] [facility] [address]   Medication will be filled on 02/27/23.

## 2023-03-06 ENCOUNTER — Encounter: Payer: Self-pay | Admitting: Physician Assistant

## 2023-03-06 ENCOUNTER — Other Ambulatory Visit (INDEPENDENT_AMBULATORY_CARE_PROVIDER_SITE_OTHER): Payer: 59

## 2023-03-06 ENCOUNTER — Ambulatory Visit (INDEPENDENT_AMBULATORY_CARE_PROVIDER_SITE_OTHER): Payer: 59 | Admitting: Physician Assistant

## 2023-03-06 DIAGNOSIS — M79672 Pain in left foot: Secondary | ICD-10-CM

## 2023-03-06 NOTE — Progress Notes (Signed)
Office Visit Note   Patient: Wendy Woods           Date of Birth: 1960-10-08           MRN: [id number] Visit Date: 03/06/2023              Requested by: Fleet Contras, MD [address] [place],  [name] [address] PCP: Fleet Contras, MD  No chief complaint on file.     HPI: Patient is a pleasant 62 year old woman who comes in for chronic left foot pain.  I last saw her in March and she had little improvement and we referred her with an MRI to Dr. Lajoyce Corners.  He advised her to wear stiff soled shoes do lots of Achilles stretching she has been doing this.  She still continues to have pain over the dorsum of the left foot.  No new injury.  Rates the pain as mild to moder  Assessment & Plan: Visit Diagnoses: Left foot pain  Plan: Her exam is unchanged.  While she does have a stiff shoe she does not have very good arch support and it discussed this briefly with her.  I would like her to follow-up with Dr. Lajoyce Corners she did not recall seeing him but then remember she had.  Her glucose control seems like it is getting better on not sure if she would be a candidate for some type of injection  Follow-Up Instructions: With Dr. Waldron Labs Exam  Patient is alert, oriented, no adenopathy, well-dressed, normal affect, normal respiratory effort. Left foot neurovascularly intact she has a palpable dorsalis pedis pulse no erythema no cellulitis compartments are soft and impression compressible she is tender over the dorsal navicular.  With resisted flexion this increases does have some tenderness over the distal tibialis anterior  Imaging: No results found. No images are attached to the encounter.  Labs: Lab Results  Component Value Date   HGBA1C 12.9 (H) 07/04/2012   REPTSTATUS 05/23/2017 FINAL 05/20/2017   CULT NO GROUP A STREP (S.PYOGENES) ISOLATED 05/20/2017     Lab Results  Component Value Date   ALBUMIN 3.5 12/20/2021   ALBUMIN 4.2 10/23/2021   ALBUMIN 3.5 10/11/2021    No  results found for: "MG" Lab Results  Component Value Date   VD25OH 25 (L) 12/18/2022    No results found for: "PREALBUMIN"    Latest Ref Rng & Units 02/09/2023    8:11 AM 12/18/2022   12:00 AM 11/10/2022    9:12 AM  CBC EXTENDED  WBC 3.8 - 10.8 Thousand/uL 7.3  6.1  5.6   RBC 3.80 - 5.10 Million/uL 3.88  3.52  3.83   Hemoglobin 11.7 - 15.5 g/dL 78.2  95.6  21.3   HCT 35.0 - 45.0 % 34.9  31.4  33.5   Platelets 140 - 400 Thousand/uL 443  378  455   NEUT# 1,500 - 7,800 cells/uL 4,913   3,130   Lymph# 850 - 3,900 cells/uL 1,796   1,826      There is no height or weight on file to calculate BMI.  Orders:  No orders of the defined types were placed in this encounter.  No orders of the defined types were placed in this encounter.    Procedures: No procedures performed  Clinical Data: No additional findings.  ROS:  All other systems negative, except as noted in the HPI. Review of Systems  Objective: Vital Signs: LMP 07/09/2014   Specialty Comments:  No specialty comments available.  PMFS History: Patient Active Problem List   Diagnosis Date Noted   Papanicolaou smear of cervix with positive high risk human papilloma virus (HPV) test 07/18/2022   Leg pain, bilateral 02/22/2021   Low back pain 01/12/2021   Osteoarthritis cervical spine 01/12/2021   Pain in left foot 12/15/2020   Osteoarthritis of knees, 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   Chronic pain of left knee 01/10/2018   Scleritis 03/22/2016   Angioedema 07/15/2014   Hypokalemia 07/15/2014   Diabetes mellitus, controlled (HCC) 07/03/2012   HTN (hypertension) 07/03/2012   Rheumatoid arthritis (HCC) 07/03/2012   Current chronic use of systemic steroids 07/03/2012   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 07/03/2012   Past Medical History:  Diagnosis Date   Asthma    as a child   DDD (degenerative disc disease), cervical 03/22/2016   DDD (degenerative disc disease), lumbar 03/22/2016   GERD (gastroesophageal reflux disease)     Hyperlipidemia    Hypertension    Rheumatoid arthritis (HCC)    Rheumatoid arthritis(714.0)    Scleritis 03/22/2016   Seasonal allergies    Type II diabetes mellitus (HCC)     Family History  Problem Relation Age of Onset   Diabetes Paternal Grandfather    Heart failure Paternal Grandfather    Hypertension Paternal Grandmother    Stroke Paternal Grandmother    Heart attack Maternal Grandmother    Tuberculosis Maternal Grandfather    Colon polyps Father    Prostate cancer Father    Hypertension Mother    Anxiety disorder Mother    Breast cancer Cousin 26   Colon cancer Neg Hx     Past Surgical History:  Procedure Laterality Date   APPENDECTOMY  1968   CATARACT EXTRACTION W/ INTRAOCULAR LENS  IMPLANT, BILATERAL Bilateral 11/2013-12/2013   KNEE ARTHROPLASTY     KNEE ARTHROSCOPY Left 1980's-1990's X 3   MYOMECTOMY  1990's   NASOPHARYNGEAL BIOPSY N/A 11/16/2021   Procedure: NASOPHARYNGEAL BIOPSY;  Surgeon: Christia Reading, MD;  Location: [facility] OR;  Service: ENT;  Laterality: N/A;   Social History   Occupational History   Not on file  Tobacco Use   Smoking status: Never    Passive exposure: Never   Smokeless tobacco: Never  Vaping Use   Vaping status: Never Used  Substance and Sexual Activity   Alcohol use: No    Alcohol/week: 0.0 standard drinks of alcohol   Drug use: No   Sexual activity: Yes

## 2023-03-14 ENCOUNTER — Other Ambulatory Visit: Payer: Self-pay

## 2023-03-19 ENCOUNTER — Ambulatory Visit: Payer: 59 | Admitting: Orthopedic Surgery

## 2023-03-20 ENCOUNTER — Other Ambulatory Visit: Payer: Self-pay | Admitting: Physician Assistant

## 2023-03-20 ENCOUNTER — Other Ambulatory Visit: Payer: Self-pay

## 2023-03-20 DIAGNOSIS — Z79899 Other long term (current) drug therapy: Secondary | ICD-10-CM

## 2023-03-20 DIAGNOSIS — M0579 Rheumatoid arthritis with rheumatoid factor of multiple sites without organ or systems involvement: Secondary | ICD-10-CM

## 2023-03-20 MED ORDER — RINVOQ 15 MG PO TB24
15.0000 mg | ORAL_TABLET | Freq: Every day | ORAL | 0 refills | Status: DC
  Filled 2023-03-20: qty 30, 30d supply, fill #0
  Filled 2023-04-18: qty 30, 30d supply, fill #1
  Filled 2023-05-16: qty 30, 30d supply, fill #2

## 2023-03-20 NOTE — Progress Notes (Signed)
Specialty Pharmacy Refill Coordination Note  Wendy Woods is a 62 y.o. female contacted today regarding refills of specialty medication(s) Upadacitinib   Patient requested Delivery   Delivery date: 03/26/23   Verified address: [address] [place], [name] [address]   Medication will be filled on 03/23/23.  Refill request is pending.

## 2023-03-20 NOTE — Telephone Encounter (Signed)
Last Fill: 01/11/2023  Labs: 02/09/2023 Glucose is elevated-218. Rest of CMP WNL. Anemia has improved. Platelet count is slightly elevated. Rest of CBC WNL.  TB Gold: 02/09/2023 Neg    Next Visit: 05/15/2023  Last Visit: 02/09/2023  GE:[id number] arthritis involving multiple sites with positive rheumatoid factor   Current Dose per office note 02/09/2023: Rinvoq 15 mg 1 tablet by mouth daily,   Okay to refill Rinvoq?

## 2023-03-21 ENCOUNTER — Other Ambulatory Visit (HOSPITAL_COMMUNITY): Payer: Self-pay

## 2023-04-02 ENCOUNTER — Telehealth: Payer: Self-pay | Admitting: Pharmacist

## 2023-04-02 NOTE — Telephone Encounter (Signed)
Submitted a Prior Authorization RENEWAL request to [facility] for RINVOQ via CoverMyMeds. Will update once we receive a response.  Key: [id number]  Chesley Mires, PharmD, MPH, BCPS, CPP Clinical Pharmacist (Rheumatology and Pulmonology)

## 2023-04-06 NOTE — Telephone Encounter (Signed)
Received notification from [facility] regarding a prior authorization for [facility]. Authorization has been APPROVED from 04/02/2023 to 04/30/24. Approval letter sent to scan center.  Patient can continue to fill through [facility]: [phone]   Authorization # [id number] Phone # [phone]  Chesley Mires, PharmD, MPH, BCPS, CPP Clinical Pharmacist (Rheumatology and Pulmonology)

## 2023-04-18 ENCOUNTER — Other Ambulatory Visit: Payer: Self-pay

## 2023-04-18 NOTE — Progress Notes (Signed)
Specialty Pharmacy Refill Coordination Note  Wendy Woods is a 62 y.o. female contacted today regarding refills of specialty medication(s) Upadacitinib (Rinvoq)   Patient requested Delivery   Delivery date: 04/23/23   Verified address: [address] [name], [name] [address]   Medication will be filled on 04/20/23.

## 2023-04-20 ENCOUNTER — Other Ambulatory Visit: Payer: Self-pay

## 2023-05-01 NOTE — Progress Notes (Deleted)
 Office Visit Note  Patient: Wendy Woods             Date of Birth: 1960/11/06           MRN: [id number]             PCP: Shelda Atlas, MD Referring: Shelda Atlas, MD Visit Date: 05/15/2023 Occupation: @GUAROCC @  Subjective:    History of Present Illness: Wendy Woods is a 62 y.o. female with history of seropositive rheumatoid arthritis and osteoarthritis.  Patient remains on Rinvoq  15 mg 1 tablet by mouth daily and arava  10 mg daily.   CBC and CMP updated on 02/09/23. Orders for CBC and CMP released today.  TB gold negative on 02/09/23.  Discussed the importance of holding rinvoq  if she develops signs or symptoms of an infection and to resume once the infection has completely cleared.   Activities of Daily Living:  Patient reports morning stiffness for *** {minute/hour:19697}.   Patient {ACTIONS;DENIES/REPORTS:21021675::Denies} nocturnal pain.  Difficulty dressing/grooming: {ACTIONS;DENIES/REPORTS:21021675::Denies} Difficulty climbing stairs: {ACTIONS;DENIES/REPORTS:21021675::Denies} Difficulty getting out of chair: {ACTIONS;DENIES/REPORTS:21021675::Denies} Difficulty using hands for taps, buttons, cutlery, and/or writing: {ACTIONS;DENIES/REPORTS:21021675::Denies}  No Rheumatology ROS completed.   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   Angioedema 07/15/2014   Hypokalemia 07/15/2014   Diabetes mellitus, controlled (HCC) 07/03/2012   HTN (hypertension) 07/03/2012   Rheumatoid arthritis (HCC) 07/03/2012   Current chronic use of systemic steroids 07/03/2012   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  07/03/2012    Past Medical History:  Diagnosis Date   Asthma    as a child   DDD (degenerative disc disease), cervical 03/22/2016   DDD (degenerative disc disease), lumbar 03/22/2016   GERD (gastroesophageal reflux disease)    Hyperlipidemia    Hypertension    Rheumatoid arthritis (HCC)    Rheumatoid arthritis(714.0)    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   Seasonal allergies    Type II diabetes mellitus (HCC)     Family History  Problem Relation Age of Onset   Diabetes Paternal Grandfather    Heart failure Paternal Grandfather    Hypertension Paternal Grandmother    Stroke Paternal Grandmother    Heart attack Maternal Grandmother    Tuberculosis Maternal Grandfather    Colon polyps Father    Prostate cancer Father    Hypertension Mother    Anxiety disorder Mother    Breast cancer Cousin 26   Colon cancer Neg Hx    Past Surgical History:  Procedure Laterality Date   APPENDECTOMY  1968   CATARACT EXTRACTION W/ INTRAOCULAR LENS  IMPLANT, BILATERAL Bilateral 11/2013-12/2013   KNEE ARTHROPLASTY     KNEE ARTHROSCOPY Left [id number]'d X 3   MYOMECTOMY  1990's   NASOPHARYNGEAL BIOPSY N/A 11/16/2021   Procedure: NASOPHARYNGEAL BIOPSY;  Surgeon: Carlie Clark, MD;  Location: [facility] OR;  Service: ENT;  Laterality: N/A;   Social History   Social History Narrative   Right Handed   Lives in a two story home    Immunization History  Administered Date(s) Administered   PFIZER(Purple Top)SARS-COV-2 Vaccination 04/23/2019, 05/12/2019, 01/28/2020     Objective: Vital Signs: LMP 07/09/2014  Physical Exam Vitals and nursing note reviewed.  Constitutional:      Appearance: She is well-developed.  HENT:     Head: Normocephalic and atraumatic.  Eyes:     Conjunctiva/sclera: Conjunctivae normal.  Cardiovascular:     Rate and Rhythm: Normal rate and regular rhythm.     Heart sounds: Normal heart sounds.  Pulmonary:     Effort: Pulmonary effort is normal.     Breath sounds: Normal breath  sounds.  Abdominal:     General: Bowel sounds are normal.     Palpations: Abdomen is soft.  Musculoskeletal:     Cervical back: Normal range of motion.  Lymphadenopathy:     Cervical: No cervical adenopathy.  Skin:    General: Skin is warm and dry.     Capillary Refill: Capillary refill takes less than 2 seconds.  Neurological:     Mental Status: She is alert and oriented to person, place, and time.  Psychiatric:        Behavior: Behavior normal.      Musculoskeletal Exam: ***  CDAI Exam: CDAI Score: -- Patient Global: --; Provider Global: -- Swollen: --; Tender: -- Joint Exam 05/15/2023   No joint exam has been documented for this visit   There is currently no information documented on the homunculus. Go to the Rheumatology activity and complete the homunculus joint exam.  Investigation: No additional findings.  Imaging: No results found.  Recent Labs: Lab Results  Component Value Date   WBC 7.3 02/09/2023   HGB 11.3 (L) 02/09/2023   PLT 443 (H) 02/09/2023   NA 140 02/09/2023   K 4.0 02/09/2023   CL 98 02/09/2023   CO2 32 02/09/2023   GLUCOSE 218 (H) 02/09/2023   BUN 13 02/09/2023   CREATININE 0.84 02/09/2023   BILITOT 0.3 02/09/2023   ALKPHOS 60 12/20/2021   AST 12 02/09/2023   ALT 12 02/09/2023   PROT 7.6 02/09/2023   ALBUMIN 3.5 12/20/2021   CALCIUM 10.2 02/09/2023   GFRAA >60 12/17/2019   QFTBGOLD Negative 09/18/2016   QFTBGOLDPLUS NEGATIVE 02/09/2023    Speciality Comments: Remicade  6mg / kg every 5 weeks-ACY 12/25/19 MTX -dcd 04/21 due to GI SEs  Procedures:  No procedures performed Allergies: Latex, Lisinopril , Penicillins, and Methotrexate  derivatives   Assessment / Plan:     Visit Diagnoses: Rheumatoid arthritis involving multiple sites with positive rheumatoid factor (HCC)  High risk medication use  Bilateral scleritis  Primary osteoarthritis of both hands  Trochanteric bursitis, right hip  Primary osteoarthritis of both  knees  DDD (degenerative disc disease), cervical  Degeneration of intervertebral disc of lumbar region without discogenic back pain or lower extremity pain  Osteopenia of multiple sites  History of vitamin D  deficiency  Essential hypertension  History of diabetes mellitus, type II  Anxiety  Orders: No orders of the defined types were placed in this encounter.  No orders of the defined types were placed in this encounter.   Face-to-face time spent with patient was *** minutes. Greater than 50% of time was spent in counseling and coordination of care.  Follow-Up Instructions: No follow-ups on file.   Waddell CHRISTELLA Craze, PA-C  Note - This record has been created using Dragon software.  Chart creation errors have been sought, but may not always  have been located. Such creation errors do not reflect on  the standard of medical care.

## 2023-05-14 ENCOUNTER — Ambulatory Visit: Payer: 59 | Admitting: Physician Assistant

## 2023-05-15 ENCOUNTER — Ambulatory Visit: Payer: 59 | Admitting: Physician Assistant

## 2023-05-15 DIAGNOSIS — M8589 Other specified disorders of bone density and structure, multiple sites: Secondary | ICD-10-CM

## 2023-05-15 DIAGNOSIS — M170 Bilateral primary osteoarthritis of knee: Secondary | ICD-10-CM

## 2023-05-15 DIAGNOSIS — M5030 Other cervical disc degeneration, unspecified cervical region: Secondary | ICD-10-CM

## 2023-05-15 DIAGNOSIS — M0579 Rheumatoid arthritis with rheumatoid factor of multiple sites without organ or systems involvement: Secondary | ICD-10-CM

## 2023-05-15 DIAGNOSIS — Z79899 Other long term (current) drug therapy: Secondary | ICD-10-CM

## 2023-05-15 DIAGNOSIS — Z8639 Personal history of other endocrine, nutritional and metabolic disease: Secondary | ICD-10-CM

## 2023-05-15 DIAGNOSIS — M19042 Primary osteoarthritis, left hand: Secondary | ICD-10-CM

## 2023-05-15 DIAGNOSIS — M51369 Other intervertebral disc degeneration, lumbar region without mention of lumbar back pain or lower extremity pain: Secondary | ICD-10-CM

## 2023-05-15 DIAGNOSIS — H15003 Unspecified scleritis, bilateral: Secondary | ICD-10-CM

## 2023-05-15 DIAGNOSIS — M7061 Trochanteric bursitis, right hip: Secondary | ICD-10-CM

## 2023-05-15 DIAGNOSIS — F419 Anxiety disorder, unspecified: Secondary | ICD-10-CM

## 2023-05-15 DIAGNOSIS — I10 Essential (primary) hypertension: Secondary | ICD-10-CM

## 2023-05-16 ENCOUNTER — Other Ambulatory Visit: Payer: Self-pay

## 2023-05-16 NOTE — Progress Notes (Signed)
 Specialty Pharmacy Refill Coordination Note  Wendy Woods is a 63 y.o. female contacted today regarding refills of specialty medication(s) Upadacitinib  (Rinvoq )   Patient requested Delivery   Delivery date: 05/21/23   Verified address: [address] [place], [name] [address]   Medication will be filled on 05/18/23.

## 2023-05-18 ENCOUNTER — Other Ambulatory Visit: Payer: Self-pay

## 2023-05-28 NOTE — Progress Notes (Deleted)
 Office Visit Note  Patient: Wendy Woods             Date of Birth: January 28, 1961           MRN: [id number]             PCP: Fleet Contras, MD Referring: Fleet Contras, MD Visit Date: 06/11/2023 Occupation: @GUAROCC @  Subjective:    History of Present Illness: Wendy Woods is a 63 y.o. female with history of seropositive rheumatoid arthritis.  Patient remains on  Rinvoq 15 mg 1 tablet by mouth daily and Arava 10 mg daily.   CBC and CMP updated on 02/09/23. Orders for CBC and CMP released today.  TB gold negative on 02/09/23. Discussed the importance of holding rinvoq and arava if she develops signs or symptoms of an infection and to resume once the infection has completely cleared.   Activities of Daily Living:  Patient reports morning stiffness for *** {minute/hour:19697}.   Patient {ACTIONS;DENIES/REPORTS:21021675::"Denies"} nocturnal pain.  Difficulty dressing/grooming: {ACTIONS;DENIES/REPORTS:21021675::"Denies"} Difficulty climbing stairs: {ACTIONS;DENIES/REPORTS:21021675::"Denies"} Difficulty getting out of chair: {ACTIONS;DENIES/REPORTS:21021675::"Denies"} Difficulty using hands for taps, buttons, cutlery, and/or writing: {ACTIONS;DENIES/REPORTS:21021675::"Denies"}  No Rheumatology ROS completed.   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   Angioedema 07/15/2014   Hypokalemia 07/15/2014   Diabetes mellitus, controlled (HCC) 07/03/2012   HTN (hypertension) 07/03/2012   Rheumatoid arthritis (HCC) 07/03/2012   Current chronic use of systemic steroids 07/03/2012   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 07/03/2012     Past Medical History:  Diagnosis Date   Asthma    as a child   DDD (degenerative disc disease), cervical 03/22/2016   DDD (degenerative disc disease), lumbar 03/22/2016   GERD (gastroesophageal reflux disease)    Hyperlipidemia    Hypertension    Rheumatoid arthritis (HCC)    Rheumatoid arthritis(714.0)    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   Seasonal allergies    Type II diabetes mellitus (HCC)     Family History  Problem Relation Age of Onset   Diabetes Paternal Grandfather    Heart failure Paternal Grandfather    Hypertension Paternal Grandmother    Stroke Paternal Grandmother    Heart attack Maternal Grandmother    Tuberculosis Maternal Grandfather    Colon polyps Father    Prostate cancer Father    Hypertension Mother    Anxiety disorder Mother    Breast cancer Cousin 26   Colon cancer Neg Hx    Past Surgical History:  Procedure Laterality Date   APPENDECTOMY  1968   CATARACT EXTRACTION W/ INTRAOCULAR LENS  IMPLANT, BILATERAL Bilateral 11/2013-12/2013   KNEE ARTHROPLASTY     KNEE ARTHROSCOPY Left 4098'J-1914'N X 3   MYOMECTOMY  1990's   NASOPHARYNGEAL BIOPSY N/A 11/16/2021   Procedure: NASOPHARYNGEAL BIOPSY;  Surgeon: Christia Reading, MD;  Location: [facility] OR;  Service: ENT;  Laterality: N/A;   Social History   Social History Narrative   Right Handed   Lives in a two story home    Immunization History  Administered Date(s) Administered   PFIZER(Purple Top)SARS-COV-2 Vaccination 04/23/2019, 05/12/2019, 01/28/2020     Objective: Vital Signs: LMP 07/09/2014  Physical Exam Vitals and nursing note reviewed.  Constitutional:      Appearance: She is well-developed.  HENT:     Head: Normocephalic and atraumatic.  Eyes:     Conjunctiva/sclera: Conjunctivae normal.  Cardiovascular:     Rate and Rhythm: Normal rate and regular rhythm.     Heart sounds: Normal heart sounds.  Pulmonary:     Effort: Pulmonary effort is normal.     Breath sounds: Normal breath sounds.   Abdominal:     General: Bowel sounds are normal.     Palpations: Abdomen is soft.  Musculoskeletal:     Cervical back: Normal range of motion.  Lymphadenopathy:     Cervical: No cervical adenopathy.  Skin:    General: Skin is warm and dry.     Capillary Refill: Capillary refill takes less than 2 seconds.  Neurological:     Mental Status: She is alert and oriented to person, place, and time.  Psychiatric:        Behavior: Behavior normal.      Musculoskeletal Exam: ***  CDAI Exam: CDAI Score: -- Patient Global: --; Provider Global: -- Swollen: --; Tender: -- Joint Exam 06/11/2023   No joint exam has been documented for this visit   There is currently no information documented on the homunculus. Go to the Rheumatology activity and complete the homunculus joint exam.  Investigation: No additional findings.  Imaging: No results found.  Recent Labs: Lab Results  Component Value Date   WBC 7.3 02/09/2023   HGB 11.3 (L) 02/09/2023   PLT 443 (H) 02/09/2023   NA 140 02/09/2023   K 4.0 02/09/2023   CL 98 02/09/2023   CO2 32 02/09/2023   GLUCOSE 218 (H) 02/09/2023   BUN 13 02/09/2023   CREATININE 0.84 02/09/2023   BILITOT 0.3 02/09/2023   ALKPHOS 60 12/20/2021   AST 12 02/09/2023   ALT 12 02/09/2023   PROT 7.6 02/09/2023   ALBUMIN 3.5 12/20/2021   CALCIUM 10.2 02/09/2023   GFRAA >60 12/17/2019   QFTBGOLD Negative 09/18/2016   QFTBGOLDPLUS NEGATIVE 02/09/2023    Speciality Comments: Remicade 6mg / kg every 5 weeks-ACY 12/25/19 MTX -dcd 04/21 due to GI SEs  Procedures:  No procedures performed Allergies: Latex, Lisinopril, Penicillins, and Methotrexate derivatives   Assessment / Plan:     Visit Diagnoses: Rheumatoid arthritis involving multiple sites with positive rheumatoid factor (HCC)  High risk medication use  Bilateral scleritis  Trochanteric bursitis, right hip  DDD (degenerative disc disease), cervical  Primary osteoarthritis of both  hands  Primary osteoarthritis of both knees  Degeneration of intervertebral disc of lumbar region without discogenic back pain or lower extremity pain  Osteopenia of multiple sites  History of vitamin D deficiency  Essential hypertension  Anxiety  History of diabetes mellitus, type II  Orders: No orders of the defined types were placed in this encounter.  No orders of the defined types were placed in this encounter.   Face-to-face time spent with patient was *** minutes. Greater than 50% of time was spent in counseling and coordination of care.  Follow-Up Instructions: No follow-ups on file.   Gearldine Bienenstock, PA-C  Note - This record has been created using Dragon software.  Chart creation errors have been sought, but may not always  have been located. Such creation errors do not reflect on  the standard of medical care.

## 2023-06-11 ENCOUNTER — Ambulatory Visit: Payer: 59 | Admitting: Physician Assistant

## 2023-06-11 DIAGNOSIS — M5030 Other cervical disc degeneration, unspecified cervical region: Secondary | ICD-10-CM

## 2023-06-11 DIAGNOSIS — M19042 Primary osteoarthritis, left hand: Secondary | ICD-10-CM

## 2023-06-11 DIAGNOSIS — M51369 Other intervertebral disc degeneration, lumbar region without mention of lumbar back pain or lower extremity pain: Secondary | ICD-10-CM

## 2023-06-11 DIAGNOSIS — I10 Essential (primary) hypertension: Secondary | ICD-10-CM

## 2023-06-11 DIAGNOSIS — F419 Anxiety disorder, unspecified: Secondary | ICD-10-CM

## 2023-06-11 DIAGNOSIS — Z79899 Other long term (current) drug therapy: Secondary | ICD-10-CM

## 2023-06-11 DIAGNOSIS — H15003 Unspecified scleritis, bilateral: Secondary | ICD-10-CM

## 2023-06-11 DIAGNOSIS — Z8639 Personal history of other endocrine, nutritional and metabolic disease: Secondary | ICD-10-CM

## 2023-06-11 DIAGNOSIS — M0579 Rheumatoid arthritis with rheumatoid factor of multiple sites without organ or systems involvement: Secondary | ICD-10-CM

## 2023-06-11 DIAGNOSIS — M8589 Other specified disorders of bone density and structure, multiple sites: Secondary | ICD-10-CM

## 2023-06-11 DIAGNOSIS — M170 Bilateral primary osteoarthritis of knee: Secondary | ICD-10-CM

## 2023-06-11 DIAGNOSIS — M7061 Trochanteric bursitis, right hip: Secondary | ICD-10-CM

## 2023-06-18 ENCOUNTER — Other Ambulatory Visit: Payer: Self-pay

## 2023-06-19 ENCOUNTER — Other Ambulatory Visit: Payer: Self-pay | Admitting: Rheumatology

## 2023-06-19 ENCOUNTER — Other Ambulatory Visit: Payer: Self-pay

## 2023-06-19 ENCOUNTER — Other Ambulatory Visit (HOSPITAL_COMMUNITY): Payer: Self-pay

## 2023-06-19 DIAGNOSIS — M0579 Rheumatoid arthritis with rheumatoid factor of multiple sites without organ or systems involvement: Secondary | ICD-10-CM

## 2023-06-19 DIAGNOSIS — Z79899 Other long term (current) drug therapy: Secondary | ICD-10-CM

## 2023-06-19 MED ORDER — RINVOQ 15 MG PO TB24
15.0000 mg | ORAL_TABLET | Freq: Every day | ORAL | 0 refills | Status: DC
  Filled 2023-06-19: qty 30, 30d supply, fill #0

## 2023-06-19 NOTE — Telephone Encounter (Signed)
Last Fill: 03/20/2023   Labs: 02/09/2023 Glucose is elevated-218. Rest of CMP WNL. Anemia has improved. Platelet count is slightly elevated. Rest of CBC WNL.   TB Gold: 02/09/2023 Neg     Next Visit: 07/10/2023   Last Visit: 02/09/2023   ON:[id number] arthritis involving multiple sites with positive rheumatoid factor    Current Dose per office note 02/09/2023: Rinvoq 15 mg 1 tablet by mouth daily,   Patient to update labs at upcoming appointment on 07/10/2023   Okay to refill Rinvoq?

## 2023-06-19 NOTE — Progress Notes (Signed)
Specialty Pharmacy Refill Coordination Note  Wendy Woods is a 63 y.o. female contacted today regarding refills of specialty medication(s) Upadacitinib (Rinvoq)   Patient requested Delivery   Delivery date: 06/22/23   Verified address: [address]   [place] [phone]   Medication will be filled on 06/21/23.

## 2023-06-21 ENCOUNTER — Other Ambulatory Visit: Payer: Self-pay

## 2023-06-26 NOTE — Progress Notes (Signed)
 Office Visit Note  Patient: Wendy Woods             Date of Birth: [birth year]           MRN: [id number]             PCP: Wendy Contras, MD Referring: Wendy Contras, MD Visit Date: 07/10/2023 Occupation: @GUAROCC @  Subjective:  Mediation monitoring   History of Present Illness: Wendy Woods is a 63 y.o. female with history of seropositive rheumatoid arthritis. Patient remains on  Rinvoq 15 mg 1 tablet by mouth daily and Arava 10 mg daily. She is tolerating combination therapy without any side effects. She denies missing any doses recently.  She denies any recent or recurrent infections. She denies any recent or recurrent flares.  She denies any joint swelling. She has been able to be more active recently. She states she recently built a shed and has been painting a friend home.  She has been able to get up and down from a ladder without difficulty.  Patient states that she is looking forward to starting to fish again as the weather warms up. She denies any new medical conditions. She states that she is scheduled to update her bone density on 09/14/2023.   Activities of Daily Living:  Patient reports morning stiffness for 10 minutes.   Patient Reports nocturnal pain.  Difficulty dressing/grooming: Reports Difficulty climbing stairs: Denies Difficulty getting out of chair: Reports Difficulty using hands for taps, buttons, cutlery, and/or writing: Reports  Review of Systems  Constitutional:  Positive for fatigue.  HENT:  Negative for mouth sores and mouth dryness.   Eyes:  Positive for dryness.  Respiratory:  Negative for shortness of breath.   Cardiovascular:  Negative for chest pain and palpitations.  Gastrointestinal:  Negative for blood in stool, constipation and diarrhea.  Endocrine: Negative for increased urination.  Genitourinary:  Negative for involuntary urination.  Musculoskeletal:  Positive for joint pain, joint pain and morning stiffness. Negative for gait problem,  joint swelling, myalgias, muscle weakness, muscle tenderness and myalgias.  Skin:  Negative for color change, rash, hair loss and sensitivity to sunlight.  Allergic/Immunologic: Negative for susceptible to infections.  Neurological:  Positive for headaches. Negative for dizziness.  Hematological:  Negative for swollen glands.  Psychiatric/Behavioral:  Negative for depressed mood and sleep disturbance. The patient is not nervous/anxious.     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   Angioedema 07/15/2014   Hypokalemia 07/15/2014   Diabetes mellitus, controlled (HCC) 07/03/2012   HTN (hypertension) 07/03/2012   Rheumatoid arthritis (HCC) 07/03/2012   Current chronic use of systemic steroids 07/03/2012   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 07/03/2012    Past Medical History:  Diagnosis Date   Asthma    as a child   DDD (degenerative disc disease), cervical 03/22/2016   DDD (degenerative disc disease), lumbar 03/22/2016   GERD (gastroesophageal reflux disease)    Hyperlipidemia    Hypertension    Rheumatoid arthritis (HCC)    Rheumatoid arthritis(714.0)    Scleritis 03/22/2016   Seasonal allergies    Type II diabetes mellitus (  HCC)     Family History  Problem Relation Age of Onset   Diabetes Paternal Grandfather    Heart failure Paternal Grandfather    Hypertension Paternal Grandmother    Stroke Paternal Grandmother    Heart attack Maternal Grandmother    Tuberculosis Maternal Grandfather    Colon polyps Father    Prostate cancer Father    Hypertension Mother    Anxiety disorder Mother    Breast cancer Cousin 24    Colon cancer Neg Hx    Past Surgical History:  Procedure Laterality Date   APPENDECTOMY  1968   CATARACT EXTRACTION W/ INTRAOCULAR LENS  IMPLANT, BILATERAL Bilateral 11/2013-12/2013   KNEE ARTHROPLASTY     KNEE ARTHROSCOPY Left 1980's-1990's X 3   MYOMECTOMY  1990's   NASOPHARYNGEAL BIOPSY N/A 11/16/2021   Procedure: NASOPHARYNGEAL BIOPSY;  Surgeon: Wendy Reading, MD;  Location: [facility] OR;  Service: ENT;  Laterality: N/A;   Social History   Social History Narrative   Right Handed   Lives in a two story home    Immunization History  Administered Date(s) Administered   PFIZER(Purple Top)SARS-COV-2 Vaccination 04/23/2019, 05/12/2019, 01/28/2020     Objective: Vital Signs: BP 124/85 (BP Location: Left Arm, Patient Position: Sitting, Cuff Size: Normal)   Pulse 67   Resp 15   Ht 5\' 4"  (1.626 m)   Wt 161 lb (73 kg)   LMP 07/09/2014   BMI 27.64 kg/m    Physical Exam Vitals and nursing note reviewed.  Constitutional:      Appearance: She is well-developed.  HENT:     Head: Normocephalic and atraumatic.  Eyes:     Conjunctiva/sclera: Conjunctivae normal.  Cardiovascular:     Rate and Rhythm: Normal rate and regular rhythm.     Heart sounds: Normal heart sounds.  Pulmonary:     Effort: Pulmonary effort is normal.     Breath sounds: Normal breath sounds.  Abdominal:     General: Bowel sounds are normal.     Palpations: Abdomen is soft.  Musculoskeletal:     Cervical back: Normal range of motion.  Lymphadenopathy:     Cervical: No cervical adenopathy.  Skin:    General: Skin is warm and Woods.     Capillary Refill: Capillary refill takes less than 2 seconds.  Neurological:     Mental Status: She is alert and oriented to person, place, and time.  Psychiatric:        Behavior: Behavior normal.      Musculoskeletal Exam: C-spine has slightly limited range of motion with lateral rotation.  Shoulder joints have good range of motion with no discomfort.  Elbow joints have good  range of motion.  Limited extension of both wrist joints but no tenderness or synovitis.  No extensor tenosynovitis noted.  No tenderness or synovitis over MCP joints.  Complete fist formation bilaterally.  Hip joints have good range of motion with no groin pain.  Knee joints have good range of motion with no warmth or effusion.  Ankle joints have good range of motion with no tenderness or joint swelling.  CDAI Exam: CDAI Score: -- Patient Global: --; Provider Global: -- Swollen: --; Tender: -- Joint Exam 07/10/2023   No joint exam has been documented for this visit   There is currently no information documented on the homunculus. Go to the Rheumatology activity and complete the homunculus joint exam.  Investigation: No additional findings.  Imaging: No results found.  Recent Labs: Lab Results  Component  Value Date   WBC 7.3 02/09/2023   HGB 11.3 (L) 02/09/2023   PLT 443 (H) 02/09/2023   NA 140 02/09/2023   K 4.0 02/09/2023   CL 98 02/09/2023   CO2 32 02/09/2023   GLUCOSE 218 (H) 02/09/2023   BUN 13 02/09/2023   CREATININE 0.84 02/09/2023   BILITOT 0.3 02/09/2023   ALKPHOS 60 12/20/2021   AST 12 02/09/2023   ALT 12 02/09/2023   PROT 7.6 02/09/2023   ALBUMIN 3.5 12/20/2021   CALCIUM 10.2 02/09/2023   GFRAA >60 12/17/2019   QFTBGOLD Negative 09/18/2016   QFTBGOLDPLUS NEGATIVE 02/09/2023    Speciality Comments: Remicade 6mg / kg every 5 weeks-ACY 12/25/19 MTX -dcd 04/21 due to GI SEs  Procedures:  No procedures performed Allergies: Latex, Lisinopril, Penicillins, and Methotrexate derivatives     Assessment / Plan:     Visit Diagnoses: Rheumatoid arthritis involving multiple sites with positive rheumatoid factor (HCC) - + RF, Anti-CCP +, ANA negative: She has no joint tenderness or synovitis on examination today.  She has not had any signs or symptoms of a rheumatoid arthritis flare.  She has clinically been doing well taking Rinvoq 15 mg 1 tablet by mouth daily and  Arava 10 mg daily.  She is tolerating combination therapy without any side effects and has not had any recent gaps in therapy.  She has not had any recent or recurrent infections.  She has been able to increase her activity level and mobility without any recurrence of symptoms.  Most recently she built a shed and has been helping to paint her friend's home without any signs of a flare.  No medication changes will be made at this time.  Updated lab work will be obtained.  She was advised to notify us if she develops any signs or symptoms of a flare.  She will follow-up in the office in 5 months or sooner if needed. Association of heart disease with rheumatoid arthritis was discussed. Need to monitor blood pressure, cholesterol, and to exercise 30-60 minutes on daily basis was discussed.  Discussed the FDA black box warning associated with taking a Jak inhibitor including increased risk for major cardiovascular events--discussed the importance of modifying risk factors.  Lipid panel obtained today. - Plan: Lipid panel  High risk medication use - Rinvoq 15 mg 1 tablet by mouth daily,  Arava 10 mg daily. (d/c [name] on 11/28/2022 due to unreliable visual field). CBC and CMP updated on 02/09/23.  Orders for CBC and CMP released today.  Her next lab work will be due in June and every 3 months to monitor for drug toxicity. TB gold negative on 02/09/23.   Lipid panel 12/18/22.  Order for TB gold released today. No recent or recurrent infections.  Discussed the importance of holding rinvoq and arava if she develops signs or symptoms of an infection and to resume once the infection has completely cleared.    - Plan: CBC with Differential/Platelet, COMPLETE METABOLIC PANEL WITH GFR, Lipid panel  Mixed hyperlipidemia - Lipid panel updated today. 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  She has not had any signs or symptoms of a scleritis flare.  No eye pain,  photophobia, or conjunctival injection.  Primary osteoarthritis of both hands: No tenderness or synovitis noted.  Complete fist formation bilaterally.  Trochanteric bursitis, right hip: Not currently symptomatic.   Primary osteoarthritis of both knees: She has good range of motion of both knee  joints on examination today.  No warmth or effusion noted.  She has been able to increase her mobility and has been more active without difficulty.  She has been helping to paint her friend's home and has been going up and down a ladder without difficulty.  DDD (degenerative disc disease), cervical: C-spine has slightly limited ROM with lateral rotation.   Degeneration of intervertebral disc of lumbar region without discogenic back pain or lower extremity pain: Not currently symptomatic.  No symptoms of radiculopathy.   Osteopenia of multiple sites - DEXA updated on 06/06/21: The BMD measured at Femur Neck Left is 0.843 g/cm2 with a T-score of -1.4. She is scheduled to have an updated DEXA on 09/14/2023.  Other medical conditions are listed as follows:   History of vitamin D deficiency  Essential hypertension: Blood pressure was 124/85 today in the office.  Discussed the importance of close blood pressure monitoring.  History of diabetes mellitus, type II: Discussed the importance of close glucose monitoring.  Anxiety    Orders: Orders Placed This Encounter  Procedures   CBC with Differential/Platelet   COMPLETE METABOLIC PANEL WITH GFR   Lipid panel   No orders of the defined types were placed in this encounter.   Follow-Up Instructions: Return in about 5 months (around 12/10/2023) for Rheumatoid arthritis.   Gearldine Bienenstock, PA-C  Note - This record has been created using Dragon software.  Chart creation errors have been sought, but may not always  have been located. Such creation errors do not reflect on  the standard of medical care.

## 2023-07-10 ENCOUNTER — Other Ambulatory Visit: Payer: Self-pay

## 2023-07-10 ENCOUNTER — Other Ambulatory Visit (HOSPITAL_COMMUNITY): Payer: Self-pay

## 2023-07-10 ENCOUNTER — Encounter: Payer: Self-pay | Admitting: Physician Assistant

## 2023-07-10 ENCOUNTER — Ambulatory Visit: Payer: 59 | Attending: Physician Assistant | Admitting: Physician Assistant

## 2023-07-10 ENCOUNTER — Other Ambulatory Visit: Payer: Self-pay | Admitting: Rheumatology

## 2023-07-10 VITALS — BP 124/85 | HR 67 | Resp 15 | Ht 64.0 in | Wt 161.0 lb

## 2023-07-10 DIAGNOSIS — I10 Essential (primary) hypertension: Secondary | ICD-10-CM

## 2023-07-10 DIAGNOSIS — H15003 Unspecified scleritis, bilateral: Secondary | ICD-10-CM | POA: Diagnosis not present

## 2023-07-10 DIAGNOSIS — M0579 Rheumatoid arthritis with rheumatoid factor of multiple sites without organ or systems involvement: Secondary | ICD-10-CM

## 2023-07-10 DIAGNOSIS — M7061 Trochanteric bursitis, right hip: Secondary | ICD-10-CM

## 2023-07-10 DIAGNOSIS — Z8639 Personal history of other endocrine, nutritional and metabolic disease: Secondary | ICD-10-CM

## 2023-07-10 DIAGNOSIS — M51369 Other intervertebral disc degeneration, lumbar region without mention of lumbar back pain or lower extremity pain: Secondary | ICD-10-CM

## 2023-07-10 DIAGNOSIS — M170 Bilateral primary osteoarthritis of knee: Secondary | ICD-10-CM

## 2023-07-10 DIAGNOSIS — M19041 Primary osteoarthritis, right hand: Secondary | ICD-10-CM

## 2023-07-10 DIAGNOSIS — E782 Mixed hyperlipidemia: Secondary | ICD-10-CM

## 2023-07-10 DIAGNOSIS — Z79899 Other long term (current) drug therapy: Secondary | ICD-10-CM

## 2023-07-10 DIAGNOSIS — F419 Anxiety disorder, unspecified: Secondary | ICD-10-CM

## 2023-07-10 DIAGNOSIS — M19042 Primary osteoarthritis, left hand: Secondary | ICD-10-CM

## 2023-07-10 DIAGNOSIS — M5030 Other cervical disc degeneration, unspecified cervical region: Secondary | ICD-10-CM

## 2023-07-10 DIAGNOSIS — M8589 Other specified disorders of bone density and structure, multiple sites: Secondary | ICD-10-CM

## 2023-07-10 MED ORDER — RINVOQ 15 MG PO TB24
15.0000 mg | ORAL_TABLET | Freq: Every day | ORAL | 2 refills | Status: DC
  Filled 2023-07-10: qty 30, 30d supply, fill #0
  Filled 2023-08-08: qty 30, 30d supply, fill #1
  Filled 2023-09-11: qty 30, 30d supply, fill #2

## 2023-07-10 NOTE — Progress Notes (Signed)
 Specialty Pharmacy Refill Coordination Note  Wendy Woods is a 63 y.o. female contacted today regarding refills of specialty medication(s) Upadacitinib (Rinvoq)   Patient requested Delivery   Delivery date: 07/17/23   Verified address: [address] [name] [address]   Medication will be filled on 07/16/23. This fill date is pending response to refill request from provider. Patient is aware and if they have not received fill by intended date they must follow up with pharmacy.

## 2023-07-10 NOTE — Telephone Encounter (Signed)
 Last Fill: 06/19/2023 (30 day supply)  Labs: 02/09/2023  Glucose is elevated-218. Rest of CMP WNL. Anemia has improved. Platelet count is slightly elevated. Rest of CBC WNL. (Updates labs in office today)  TB Gold: 02/09/2023 Neg    Next Visit: 12/10/2023  Last Visit: 07/10/2023  DX: Rheumatoid arthritis involving multiple sites with positive rheumatoid   Current Dose per office note 07/10/2023: Rinvoq 15 mg 1 tablet by mouth daily   Okay to refill Rinvoq?

## 2023-07-10 NOTE — Patient Instructions (Signed)

## 2023-07-10 NOTE — Progress Notes (Signed)
 Specialty Pharmacy Ongoing Clinical Assessment Note  Wendy Woods is a 63 y.o. female who is being followed by the specialty pharmacy service for RxSp Rheumatoid Arthritis   Patient's specialty medication(s) reviewed today: Upadacitinib (Rinvoq)   Missed doses in the last 4 weeks: 0   Patient/Caregiver did not have any additional questions or concerns.   Therapeutic benefit summary: Patient is achieving benefit   Adverse events/side effects summary: No adverse events/side effects   Patient's therapy is appropriate to: Continue    Goals Addressed             This Visit's Progress    Minimize recurrence of flares       Patient is on track. Patient will maintain adherence.  Patient reports that she is well-controlled on therapy at this time.          Follow up:  6 months  Servando Snare Specialty Pharmacist

## 2023-07-10 NOTE — Progress Notes (Signed)
 Platelet count was borderline elevated at 424K.  We will continue to monitor.

## 2023-07-11 LAB — COMPLETE METABOLIC PANEL WITH GFR
AG Ratio: 1.2 (calc) (ref 1.0–2.5)
ALT: 15 U/L (ref 6–29)
AST: 14 U/L (ref 10–35)
Albumin: 4.3 g/dL (ref 3.6–5.1)
Alkaline phosphatase (APISO): 62 U/L (ref 37–153)
BUN/Creatinine Ratio: 13 (calc) (ref 6–22)
BUN: 14 mg/dL (ref 7–25)
CO2: 31 mmol/L (ref 20–32)
Calcium: 9.9 mg/dL (ref 8.6–10.4)
Chloride: 90 mmol/L — ABNORMAL LOW (ref 98–110)
Creat: 1.08 mg/dL — ABNORMAL HIGH (ref 0.50–1.05)
Globulin: 3.5 g/dL (ref 1.9–3.7)
Glucose, Bld: 581 mg/dL (ref 65–99)
Potassium: 4.7 mmol/L (ref 3.5–5.3)
Sodium: 129 mmol/L — ABNORMAL LOW (ref 135–146)
Total Bilirubin: 0.3 mg/dL (ref 0.2–1.2)
Total Protein: 7.8 g/dL (ref 6.1–8.1)
eGFR: 58 mL/min/{1.73_m2} — ABNORMAL LOW (ref >=60)

## 2023-07-11 LAB — CBC WITH DIFFERENTIAL/PLATELET
Absolute Lymphocytes: 1596 {cells}/uL (ref 850–3900)
Absolute Monocytes: 464 {cells}/uL (ref 200–950)
Basophils Absolute: 31 {cells}/uL (ref 0–200)
Basophils Relative: 0.6 %
Eosinophils Absolute: 82 {cells}/uL (ref 15–500)
Eosinophils Relative: 1.6 %
HCT: 36.8 % (ref 35.0–45.0)
Hemoglobin: 11.7 g/dL (ref 11.7–15.5)
MCH: 29.4 pg (ref 27.0–33.0)
MCHC: 31.8 g/dL — ABNORMAL LOW (ref 32.0–36.0)
MCV: 92.5 fL (ref 80.0–100.0)
MPV: 9.3 fL (ref 7.5–12.5)
Monocytes Relative: 9.1 %
Neutro Abs: 2927 {cells}/uL (ref 1500–7800)
Neutrophils Relative %: 57.4 %
Platelets: 424 10*3/uL — ABNORMAL HIGH (ref 140–400)
RBC: 3.98 10*6/uL (ref 3.80–5.10)
RDW: 12.8 % (ref 11.0–15.0)
Total Lymphocyte: 31.3 %
WBC: 5.1 10*3/uL (ref 3.8–10.8)

## 2023-07-11 LAB — LIPID PANEL
Cholesterol: 321 mg/dL — ABNORMAL HIGH (ref <200)
HDL: 62 mg/dL (ref >=50)
LDL Cholesterol (Calc): 221 mg/dL — ABNORMAL HIGH
Non-HDL Cholesterol (Calc): 259 mg/dL — ABNORMAL HIGH (ref <130)
Total CHOL/HDL Ratio: 5.2 (calc) — ABNORMAL HIGH (ref <5.0)
Triglycerides: 204 mg/dL — ABNORMAL HIGH (ref <150)

## 2023-07-11 NOTE — Progress Notes (Signed)
 I received a call from the on-call service/quest regarding a critical lab value--glucose was 581 yesterday while the patient was in the office for a routine office visit.   I attempted to call the patient but her phone went straight to voicemail-I advised the patient to call back ASAP to discuss critical lab value.   Humalog and trulicity are the on the patients medication list.  Please clarify if the patient has been taking her medications as prescribed and if she has been monitoring her blood glucose closely? Please ask the patient to check blood glucose on the phone if she calls back--if it remains elevated please recommend for the patient to go to the ED.    Creatinine is also elevated-1.08 and GFR is low-58. Sodium is low-129.  Cholesterol is elevated-321. TG elevated. LDL is elevated. Please notify the patient and advise patient to schedule follow up to discuss results and treatment options with her PCP.

## 2023-07-11 NOTE — Progress Notes (Signed)
 I spoke with the patient, she reports she did not take her insulin yesterday morning prior to her appointment.  She had oatmeal and dried fruit for breakfast. She was driving while I was on the phone with her so she is unsure what her current glucose level is. She has an appointment this morning with her PCP.     Please forward results to PCP. I discussed results with patient.

## 2023-07-25 ENCOUNTER — Other Ambulatory Visit: Payer: Self-pay

## 2023-08-08 ENCOUNTER — Other Ambulatory Visit: Payer: Self-pay

## 2023-08-08 NOTE — Progress Notes (Signed)
 Specialty Pharmacy Refill Coordination Note  Wendy Woods is a 63 y.o. female contacted today regarding refills of specialty medication(s) Upadacitinib (Rinvoq)   Patient requested (Patient-Rptd) Delivery   Delivery date: (Patient-Rptd) 08/14/23   Verified address: (Patient-Rptd) [id number] [place] [id number]   Medication will be filled on 08/13/23.

## 2023-09-04 ENCOUNTER — Other Ambulatory Visit: Payer: Self-pay

## 2023-09-07 ENCOUNTER — Other Ambulatory Visit (HOSPITAL_COMMUNITY): Payer: Self-pay

## 2023-09-11 ENCOUNTER — Other Ambulatory Visit: Payer: Self-pay

## 2023-09-11 NOTE — Progress Notes (Signed)
 Specialty Pharmacy Refill Coordination Note  Wendy Woods is a 63 y.o. female contacted today regarding refills of specialty medication(s) Upadacitinib  (Rinvoq )   Patient requested Delivery   Delivery date: 09/14/23   Verified address: [address] [name] [address]   Medication will be filled on 05.15.25.

## 2023-09-14 ENCOUNTER — Inpatient Hospital Stay: Admission: RE | Admit: 2023-09-14 | Payer: 59 | Source: Ambulatory Visit

## 2023-09-19 ENCOUNTER — Ambulatory Visit
Admission: RE | Admit: 2023-09-19 | Discharge: 2023-09-19 | Disposition: A | Discharge status: Home / Self Care | Source: Ambulatory Visit | Attending: Internal Medicine | Admitting: Internal Medicine

## 2023-09-19 DIAGNOSIS — E2839 Other primary ovarian failure: Secondary | ICD-10-CM

## 2023-10-11 ENCOUNTER — Other Ambulatory Visit: Payer: Self-pay

## 2023-10-11 ENCOUNTER — Other Ambulatory Visit (HOSPITAL_COMMUNITY): Payer: Self-pay

## 2023-10-11 ENCOUNTER — Other Ambulatory Visit: Payer: Self-pay | Admitting: Physician Assistant

## 2023-10-11 DIAGNOSIS — M0579 Rheumatoid arthritis with rheumatoid factor of multiple sites without organ or systems involvement: Secondary | ICD-10-CM

## 2023-10-11 DIAGNOSIS — Z79899 Other long term (current) drug therapy: Secondary | ICD-10-CM

## 2023-10-11 MED ORDER — RINVOQ 15 MG PO TB24
15.0000 mg | ORAL_TABLET | Freq: Every day | ORAL | 0 refills | Status: DC
  Filled 2023-10-11 – 2023-10-15 (×2): qty 30, 30d supply, fill #0

## 2023-10-11 NOTE — Telephone Encounter (Signed)
 Last Fill: 07/10/2023  Labs: 07/10/2023 Creatinine is also elevated-1.08 and GFR is low-58. Sodium is low-129. -glucose was 581 yesterday while the patient was in the office for a routine office visit.     TB Gold: 02/09/2023 Neg    Next Visit: 12/10/2023  Last Visit: 07/10/2023  [id number] arthritis involving multiple sites with positive rheumatoid   Current Dose per office note 07/10/2023: Rinvoq  15 mg 1 tablet by mouth daily   Patient advised she is due to update her labs. Patient states she will update her labs next week.   Okay to refill Rinvoq ?

## 2023-10-15 ENCOUNTER — Other Ambulatory Visit: Payer: Self-pay

## 2023-10-17 ENCOUNTER — Other Ambulatory Visit: Payer: Self-pay | Admitting: Pharmacy Technician

## 2023-10-17 ENCOUNTER — Other Ambulatory Visit: Payer: Self-pay

## 2023-10-17 NOTE — Progress Notes (Signed)
 Specialty Pharmacy Refill Coordination Note  Wendy Woods is a 63 y.o. female contacted today regarding refills of specialty medication(s) Upadacitinib  (Rinvoq )   Patient requested Delivery   Delivery date: 10/19/23   Verified address: [address] [place]   Medication will be filled on 10/18/23.

## 2023-11-14 ENCOUNTER — Other Ambulatory Visit: Payer: Self-pay

## 2023-11-14 ENCOUNTER — Other Ambulatory Visit: Payer: Self-pay | Admitting: Physician Assistant

## 2023-11-14 DIAGNOSIS — Z79899 Other long term (current) drug therapy: Secondary | ICD-10-CM

## 2023-11-14 DIAGNOSIS — M0579 Rheumatoid arthritis with rheumatoid factor of multiple sites without organ or systems involvement: Secondary | ICD-10-CM

## 2023-11-14 NOTE — Progress Notes (Signed)
 Specialty Pharmacy Refill Coordination Note  Wendy Woods is a 63 y.o. female contacted today regarding refills of specialty medication(s) Upadacitinib  (Rinvoq )   Patient requested Delivery   Delivery date: 11/15/23   Verified address: [address] [place]   Medication will be filled on 11/14/23.

## 2023-11-14 NOTE — Progress Notes (Signed)
 Refill request denied, patient needs labs. Patient aware. Retiming refill for 2 weeks. Patient had 22 tablets on hand.

## 2023-11-26 NOTE — Progress Notes (Signed)
 Office Visit Note  Patient: Wendy Woods             Date of Birth: Aug 30, 1960           MRN: [id number]             PCP: Shelda Atlas, MD Referring: Shelda Atlas, MD Visit Date: 12/10/2023 Occupation: @GUAROCC @  Subjective:  Medication monitoring   History of Present Illness: Wendy Woods is a 63 y.o. female with history of seropositive rheumatoid arthritis, scleritis, and osteoarthritis.  Patient remains on Rinvoq  15 mg 1 tablet by mouth daily and Arava  10 mg daily.  Patient states for the past few days she has been splitting the tablet (broken half since she is almost out of her prescription.  She has requested a refill of benzo to be sent to the pharmacy today.  She denies any signs or symptoms of a rheumatoid arthritis flare.  She denies any joint swelling at this time.  She has occasional arthralgias but overall her symptoms have been well-controlled on the current treatment regimen.  She has been able to be active including fishing several times this summer.   She denies any recent or recurrent infections.   Activities of Daily Living:  Patient reports morning stiffness for 10 minutes.   Patient Reports nocturnal pain.  Difficulty dressing/grooming: Denies Difficulty climbing stairs: Reports Difficulty getting out of chair: Reports Difficulty using hands for taps, buttons, cutlery, and/or writing: Reports  Review of Systems  Constitutional:  Positive for fatigue.  HENT:  Negative for mouth sores and mouth dryness.   Eyes:  Positive for dryness.  Respiratory:  Negative for shortness of breath.   Cardiovascular:  Positive for irregular heartbeat. Negative for chest pain and palpitations.  Gastrointestinal:  Negative for blood in stool, constipation and diarrhea.  Endocrine: Negative for increased urination.  Genitourinary:  Positive for involuntary urination.  Musculoskeletal:  Positive for joint pain, joint pain, joint swelling, myalgias, muscle weakness, morning  stiffness, muscle tenderness and myalgias. Negative for gait problem.  Skin:  Negative for color change, rash, hair loss and sensitivity to sunlight.  Allergic/Immunologic: Negative for susceptible to infections.  Neurological:  Positive for headaches. Negative for dizziness.  Hematological:  Negative for swollen glands.  Psychiatric/Behavioral:  Positive for sleep disturbance. Negative for depressed mood. The patient is not nervous/anxious.     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   Angioedema 07/15/2014   Hypokalemia 07/15/2014   Diabetes mellitus, controlled (HCC) 07/03/2012   HTN (hypertension) 07/03/2012   Rheumatoid arthritis (HCC) 07/03/2012   Current chronic use of systemic steroids 07/03/2012   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 07/03/2012    Past Medical History:  Diagnosis Date   Asthma    as a child   DDD (degenerative disc disease), cervical 03/22/2016   DDD (degenerative disc disease), lumbar 03/22/2016   GERD (gastroesophageal reflux disease)    Hyperlipidemia    Hypertension    Rheumatoid arthritis (HCC)    Rheumatoid arthritis(714.0)    Scleritis 03/22/2016   Seasonal allergies    Type II diabetes mellitus (HCC)  Family History  Problem Relation Age of Onset   Hypertension Mother    Anxiety disorder Mother    Colon polyps Father    Prostate cancer Father    Heart attack Maternal Grandmother    Tuberculosis Maternal Grandfather    Hypertension Paternal Grandmother    Stroke Paternal Grandmother    Diabetes Paternal Grandfather    Heart failure Paternal Grandfather    Breast cancer  Cousin 26   Colon cancer Neg Hx    Past Surgical History:  Procedure Laterality Date   APPENDECTOMY  1968   CATARACT EXTRACTION W/ INTRAOCULAR LENS  IMPLANT, BILATERAL Bilateral 11/2013-12/2013   KNEE ARTHROPLASTY     KNEE ARTHROSCOPY Left 1980's-1990's X 3   MYOMECTOMY  1990's   NASOPHARYNGEAL BIOPSY N/A 11/16/2021   Procedure: NASOPHARYNGEAL BIOPSY;  Surgeon: Carlie Clark, MD;  Location: [facility] OR;  Service: ENT;  Laterality: N/A;   Social History   Social History Narrative   Right Handed   Lives in a two story home    Immunization History  Administered Date(s) Administered   PFIZER(Purple Top)SARS-COV-2 Vaccination 04/23/2019, 05/12/2019, 01/28/2020     Objective: Vital Signs: BP 132/84 (BP Location: Left Arm, Patient Position: Sitting, Cuff Size: Normal)   Pulse 70   Resp 16   Ht 5' 4 (1.626 m)   Wt 161 lb 6.4 oz (73.2 kg)   LMP 07/09/2014   BMI 27.70 kg/m    Physical Exam Vitals and nursing note reviewed.  Constitutional:      Appearance: She is well-developed.  HENT:     Head: Normocephalic and atraumatic.  Eyes:     Conjunctiva/sclera: Conjunctivae normal.  Cardiovascular:     Rate and Rhythm: Normal rate and regular rhythm.     Heart sounds: Normal heart sounds.  Pulmonary:     Effort: Pulmonary effort is normal.     Breath sounds: Normal breath sounds.  Abdominal:     General: Bowel sounds are normal.     Palpations: Abdomen is soft.  Musculoskeletal:     Cervical back: Normal range of motion.  Lymphadenopathy:     Cervical: No cervical adenopathy.  Skin:    General: Skin is warm and dry.     Capillary Refill: Capillary refill takes less than 2 seconds.  Neurological:     Mental Status: She is alert and oriented to person, place, and time.  Psychiatric:        Behavior: Behavior normal.      Musculoskeletal Exam: C-spine has slightly limited range of motion with lateral rotation.  Shoulder joints elbow joints have good range of motion.  Slightly  limited extension of both wrist joints but no synovitis or tenosynovitis noted.  No tenderness or synovitis over MCP joints.  Complete fist formation bilaterally.  Hip joints have good range of motion with no groin pain.  Knee joints have good range of motion no warmth or effusion.  Ankle joints have good range of motion with no tenderness or joint swelling.  CDAI Exam: CDAI Score: -- Patient Global: --; Provider Global: -- Swollen: --; Tender: -- Joint Exam 12/10/2023   No joint exam has been documented for this visit   There is currently no information documented on the homunculus. Go to the Rheumatology activity and complete the homunculus joint exam.  Investigation: No additional findings.  Imaging: No results found.  Recent Labs: Lab Results  Component Value Date   WBC 5.4 12/07/2023   HGB 11.5 (L) 12/07/2023   PLT  440 (H) 12/07/2023   NA 135 12/07/2023   K 3.9 12/07/2023   CL 94 (L) 12/07/2023   CO2 30 12/07/2023   GLUCOSE 374 (H) 12/07/2023   BUN 11 12/07/2023   CREATININE 0.85 12/07/2023   BILITOT 0.3 12/07/2023   ALKPHOS 60 12/20/2021   AST 15 12/07/2023   ALT 15 12/07/2023   PROT 7.4 12/07/2023   ALBUMIN 3.5 12/20/2021   CALCIUM 10.0 12/07/2023   GFRAA >60 12/17/2019   QFTBGOLD Negative 09/18/2016   QFTBGOLDPLUS NEGATIVE 02/09/2023    Speciality Comments: Remicade  6mg / kg every 5 weeks-ACY 12/25/19 MTX -dcd 04/21 due to GI SEs  Procedures:  No procedures performed Allergies: Latex, Lisinopril , Penicillins, and Methotrexate  derivatives   Assessment / Plan:     Visit Diagnoses: Rheumatoid arthritis involving multiple sites with positive rheumatoid factor (HCC) - + RF, Anti-CCP +, ANA negative: She has no synovitis on examination today.  She has not had any signs or symptoms of a rheumatoid arthritis flare.  She has clinically been doing well taking Rinvoq  15 mg 1 tablet by mouth daily and Arava  10 mg daily.  She is tolerating combination therapy and up until  recently had not had a gap in therapy.  She has been taking a half a tablet of Rinvoq  daily for the past few days to help stretch her prescription until refill was sent.  A refill of Rinvoq  was sent to the pharmacy today.   She has been able to remain active and has been fishing on a regular basis which she enjoys.  She will notify us  if she develops any new or worsening symptoms.  She will follow-up in the office in 5 months or sooner if needed.- Plan: Upadacitinib  ER (RINVOQ ) 15 MG TB24  High risk medication use - Rinvoq  15 mg 1 tablet by mouth daily,  Arava  10 mg daily. (d/c [name] on 11/28/2022 due to unreliable visual field). Lipid panel updated on 07/10/23.  CBC and CMP updated on 12/07/23. Her next lab work will be due in November and every 3 months.  TB gold negative on 02/09/23. Future order for TB gold placed today.  No recent or recurrent infections. Discussed the importance of holding rinvoq  and arava  if she develops signs or symptoms of an infection and to resume once the infection has completely cleared.   - Plan: QuantiFERON-TB Gold Plus, Upadacitinib  ER (RINVOQ ) 15 MG TB24  Mixed hyperlipidemia: Lipid panel updated on 07/10/2023.  She is taking Lipitor as prescribed.  Screening for tuberculosis -Future order for TB gold placed today.  Plan: QuantiFERON-TB Gold Plus  Bilateral scleritis - Dr. Alvin signs or symptoms of a scleritis flare.  No conjunctival injection noted.  Primary osteoarthritis of both hands: No tenderness or synovitis noted.  Complete fist formation bilaterally.  Discussed the importance of joint protection and muscle strengthening.  Trochanteric bursitis, right hip: Not currently symptomatic.  Primary osteoarthritis of both knees: Intermittent discomfort in both knees.  No warmth or effusion noted.  DDD (degenerative disc disease), cervical: C-spine has limited range of motion with lateral rotation.  Degeneration of intervertebral disc of lumbar region without  discogenic back pain or lower extremity pain: Intermittent discomfort in her lower back.  No symptoms of radiculopathy.  Other medical conditions are listed as follows:  Osteopenia of multiple sites - DEXA updated on 06/06/21: The BMD measured at Femur Neck Left is 0.843 g/cm2 with a T-score of -1.4. DEXA updated on 09/19/2023-ordered by PCP: Left femoral neck BMD 0.790 with  T-score -1.8.  Right femoral neck BMD 0.832 with T-score -1.5. Her next DEXA will be due in May 2027.  History of vitamin D  deficiency  Essential hypertension: Blood pressure was 132/84 today in the office.  History of diabetes mellitus, type II  Anxiety    Orders: Orders Placed This Encounter  Procedures   QuantiFERON-TB Gold Plus   Meds ordered this encounter  Medications   DISCONTD: Upadacitinib  ER (RINVOQ ) 15 MG TB24    Sig: Take 1 tablet (15 mg total) by mouth daily.    Dispense:  90 tablet    Refill:  0   Upadacitinib  ER (RINVOQ ) 15 MG TB24    Sig: Take 1 tablet (15 mg total) by mouth daily.    Dispense:  90 tablet    Refill:  0    Follow-Up Instructions: Return in about 5 months (around 05/11/2024) for Rheumatoid arthritis.   Waddell CHRISTELLA Craze, PA-C  Note - This record has been created using Dragon software.  Chart creation errors have been sought, but may not always  have been located. Such creation errors do not reflect on  the standard of medical care.

## 2023-12-07 ENCOUNTER — Other Ambulatory Visit: Payer: Self-pay | Admitting: *Deleted

## 2023-12-07 DIAGNOSIS — Z79899 Other long term (current) drug therapy: Secondary | ICD-10-CM

## 2023-12-07 LAB — COMPREHENSIVE METABOLIC PANEL WITH GFR
AG Ratio: 1.2 (calc) (ref 1.0–2.5)
ALT: 15 U/L (ref 6–29)
AST: 15 U/L (ref 10–35)
Albumin: 4.1 g/dL (ref 3.6–5.1)
Alkaline phosphatase (APISO): 57 U/L (ref 37–153)
BUN: 11 mg/dL (ref 7–25)
CO2: 30 mmol/L (ref 20–32)
Calcium: 10 mg/dL (ref 8.6–10.4)
Chloride: 94 mmol/L — ABNORMAL LOW (ref 98–110)
Creat: 0.85 mg/dL (ref 0.50–1.05)
Globulin: 3.3 g/dL (ref 1.9–3.7)
Glucose, Bld: 374 mg/dL — ABNORMAL HIGH (ref 65–99)
Potassium: 3.9 mmol/L (ref 3.5–5.3)
Sodium: 135 mmol/L (ref 135–146)
Total Bilirubin: 0.3 mg/dL (ref 0.2–1.2)
Total Protein: 7.4 g/dL (ref 6.1–8.1)
eGFR: 77 mL/min/1.73m2 (ref >=60)

## 2023-12-07 LAB — CBC WITH DIFFERENTIAL/PLATELET
Absolute Lymphocytes: 1469 {cells}/uL (ref 850–3900)
Absolute Monocytes: 400 {cells}/uL (ref 200–950)
Basophils Absolute: 32 {cells}/uL (ref 0–200)
Basophils Relative: 0.6 %
Eosinophils Absolute: 103 {cells}/uL (ref 15–500)
Eosinophils Relative: 1.9 %
HCT: 35.5 % (ref 35.0–45.0)
Hemoglobin: 11.5 g/dL — ABNORMAL LOW (ref 11.7–15.5)
MCH: 29.6 pg (ref 27.0–33.0)
MCHC: 32.4 g/dL (ref 32.0–36.0)
MCV: 91.3 fL (ref 80.0–100.0)
MPV: 9.1 fL (ref 7.5–12.5)
Monocytes Relative: 7.4 %
Neutro Abs: 3397 {cells}/uL (ref 1500–7800)
Neutrophils Relative %: 62.9 %
Platelets: 440 Thousand/uL — ABNORMAL HIGH (ref 140–400)
RBC: 3.89 Million/uL (ref 3.80–5.10)
RDW: 13.6 % (ref 11.0–15.0)
Total Lymphocyte: 27.2 %
WBC: 5.4 Thousand/uL (ref 3.8–10.8)

## 2023-12-09 ENCOUNTER — Ambulatory Visit: Payer: Self-pay | Admitting: Rheumatology

## 2023-12-09 NOTE — Progress Notes (Signed)
 Glucose is elevated at 374.  CBC normal except low hemoglobin which is stable.  Please notify patient and forward results to her PCP.

## 2023-12-10 ENCOUNTER — Encounter: Payer: Self-pay | Admitting: Physician Assistant

## 2023-12-10 ENCOUNTER — Other Ambulatory Visit: Payer: Self-pay

## 2023-12-10 ENCOUNTER — Ambulatory Visit: Attending: Physician Assistant | Admitting: Physician Assistant

## 2023-12-10 VITALS — BP 132/84 | HR 70 | Resp 16 | Ht 64.0 in | Wt 161.4 lb

## 2023-12-10 DIAGNOSIS — E782 Mixed hyperlipidemia: Secondary | ICD-10-CM

## 2023-12-10 DIAGNOSIS — M170 Bilateral primary osteoarthritis of knee: Secondary | ICD-10-CM

## 2023-12-10 DIAGNOSIS — M7061 Trochanteric bursitis, right hip: Secondary | ICD-10-CM

## 2023-12-10 DIAGNOSIS — I10 Essential (primary) hypertension: Secondary | ICD-10-CM

## 2023-12-10 DIAGNOSIS — M0579 Rheumatoid arthritis with rheumatoid factor of multiple sites without organ or systems involvement: Secondary | ICD-10-CM

## 2023-12-10 DIAGNOSIS — F419 Anxiety disorder, unspecified: Secondary | ICD-10-CM

## 2023-12-10 DIAGNOSIS — Z79899 Other long term (current) drug therapy: Secondary | ICD-10-CM

## 2023-12-10 DIAGNOSIS — M5030 Other cervical disc degeneration, unspecified cervical region: Secondary | ICD-10-CM

## 2023-12-10 DIAGNOSIS — Z111 Encounter for screening for respiratory tuberculosis: Secondary | ICD-10-CM

## 2023-12-10 DIAGNOSIS — M19041 Primary osteoarthritis, right hand: Secondary | ICD-10-CM | POA: Diagnosis not present

## 2023-12-10 DIAGNOSIS — Z8639 Personal history of other endocrine, nutritional and metabolic disease: Secondary | ICD-10-CM

## 2023-12-10 DIAGNOSIS — H15003 Unspecified scleritis, bilateral: Secondary | ICD-10-CM | POA: Diagnosis not present

## 2023-12-10 DIAGNOSIS — M8589 Other specified disorders of bone density and structure, multiple sites: Secondary | ICD-10-CM

## 2023-12-10 DIAGNOSIS — M51369 Other intervertebral disc degeneration, lumbar region without mention of lumbar back pain or lower extremity pain: Secondary | ICD-10-CM

## 2023-12-10 DIAGNOSIS — M19042 Primary osteoarthritis, left hand: Secondary | ICD-10-CM

## 2023-12-10 MED ORDER — RINVOQ 15 MG PO TB24: 15.0000 mg | ORAL_TABLET | Freq: Every day | ORAL | 0 refills | Status: DC

## 2023-12-10 MED ORDER — RINVOQ 15 MG PO TB24
15.0000 mg | ORAL_TABLET | Freq: Every day | ORAL | 0 refills | Status: DC
  Filled 2023-12-10: qty 90, 90d supply, fill #0
  Filled 2023-12-12: qty 30, 30d supply, fill #0
  Filled 2024-01-08: qty 30, 30d supply, fill #1
  Filled 2024-02-04: qty 30, 30d supply, fill #2

## 2023-12-10 NOTE — Patient Instructions (Signed)
 Standing Labs We placed an order today for your standing lab work.   Please have your standing labs drawn in November and every 3 months   Please have your labs drawn 2 weeks prior to your appointment so that the provider can discuss your lab results at your appointment, if possible.  Please note that you may see your imaging and lab results in MyChart before we have reviewed them. We will contact you once all results are reviewed. Please allow our office up to 72 hours to thoroughly review all of the results before contacting the office for clarification of your results.  WALK-IN LAB HOURS  Monday through Thursday from 8:00 am -12:30 pm and 1:00 pm-4:30 pm and Friday from 8:00 am-12:00 pm.  Patients with office visits requiring labs will be seen before walk-in labs.  You may encounter longer than normal wait times. Please allow additional time. Wait times may be shorter on  Monday and Thursday afternoons.  We do not book appointments for walk-in labs. We appreciate your patience and understanding with our staff.   Labs are drawn by [facility]. Please bring your co-pay at the time of your lab draw.  You may receive a bill from [facility] for your lab work.  Please note if you are on Hydroxychloroquine  and and an order has been placed for a Hydroxychloroquine  level,  you will need to have it drawn 4 hours or more after your last dose.  If you wish to have your labs drawn at another location, please call the office 24 hours in advance so we can fax the orders.  The office is located at [address], [place], [name] [address]   If you have any questions regarding directions or hours of operation,  please call [phone].   As a reminder, please drink plenty of water prior to coming for your lab work. Thanks!

## 2023-12-12 ENCOUNTER — Other Ambulatory Visit: Payer: Self-pay

## 2023-12-12 NOTE — Progress Notes (Signed)
 Specialty Pharmacy Refill Coordination Note  Wendy Woods is a 63 y.o. female contacted today regarding refills of specialty medication(s) Upadacitinib  (Rinvoq )   Patient requested Delivery   Delivery date: 12/13/23   Verified address: [address] [name]   Medication will be filled on 08.13.25.

## 2024-01-08 ENCOUNTER — Other Ambulatory Visit: Payer: Self-pay | Admitting: Pharmacy Technician

## 2024-01-08 ENCOUNTER — Other Ambulatory Visit (HOSPITAL_COMMUNITY): Payer: Self-pay

## 2024-01-08 ENCOUNTER — Other Ambulatory Visit: Payer: Self-pay

## 2024-01-08 NOTE — Progress Notes (Signed)
 Specialty Pharmacy Refill Coordination Note  Wendy Woods is a 63 y.o. female contacted today regarding refills of specialty medication(s) Upadacitinib  (Rinvoq )   Patient requested Delivery   Delivery date: 01/09/24   Verified address: [address] [place]   Medication will be filled on 01/08/24.

## 2024-02-01 ENCOUNTER — Other Ambulatory Visit: Payer: Self-pay | Admitting: Medical Genetics

## 2024-02-01 DIAGNOSIS — Z006 Encounter for examination for normal comparison and control in clinical research program: Secondary | ICD-10-CM

## 2024-02-04 ENCOUNTER — Other Ambulatory Visit: Payer: Self-pay | Admitting: Pharmacy Technician

## 2024-02-04 ENCOUNTER — Other Ambulatory Visit: Payer: Self-pay

## 2024-02-04 NOTE — Progress Notes (Signed)
 Specialty Pharmacy Refill Coordination Note  Wendy Woods is a 63 y.o. female contacted today regarding refills of specialty medication(s) Upadacitinib  (Rinvoq )   Patient requested Delivery   Delivery date: 02/06/24   Verified address: [address]  [place]   Medication will be filled on 02/05/24.

## 2024-02-25 ENCOUNTER — Other Ambulatory Visit: Payer: Self-pay | Admitting: Medical Genetics

## 2024-02-25 DIAGNOSIS — Z006 Encounter for examination for normal comparison and control in clinical research program: Secondary | ICD-10-CM

## 2024-02-28 ENCOUNTER — Other Ambulatory Visit: Payer: Self-pay

## 2024-02-28 ENCOUNTER — Other Ambulatory Visit: Payer: Self-pay | Admitting: Physician Assistant

## 2024-02-28 ENCOUNTER — Other Ambulatory Visit (HOSPITAL_COMMUNITY): Payer: Self-pay

## 2024-02-28 DIAGNOSIS — Z79899 Other long term (current) drug therapy: Secondary | ICD-10-CM

## 2024-02-28 DIAGNOSIS — M0579 Rheumatoid arthritis with rheumatoid factor of multiple sites without organ or systems involvement: Secondary | ICD-10-CM

## 2024-02-28 MED ORDER — RINVOQ 15 MG PO TB24
15.0000 mg | ORAL_TABLET | Freq: Every day | ORAL | 0 refills | Status: DC
  Filled 2024-02-28: qty 30, 30d supply, fill #0

## 2024-02-28 NOTE — Progress Notes (Signed)
 Specialty Pharmacy Refill Coordination Note  Wendy Woods is a 63 y.o. female contacted today regarding refills of specialty medication(s) Upadacitinib  (Rinvoq )   Patient requested Delivery   Delivery date: 03/03/24   Verified address: [address]  [place] [address]   Medication will be filled on: 02/29/24   This fill date is pending response to refill request from provider. Patient is aware and if they have not received fill by intended date they must follow up with pharmacy.

## 2024-02-28 NOTE — Telephone Encounter (Signed)
 Last Fill: 12/10/2023  Labs: 12/07/2023 Glucose is elevated at 374. CBC normal except low hemoglobin which is stable.   TB Gold: 02/09/2023 Neg    Next Visit: 05/12/2024  Last Visit: 11/30/2023  IK:Myzlfjunpi arthritis involving multiple sites with positive rheumatoid factor   Current Dose per office note 12/10/2023: Rinvoq  15 mg 1 tablet by mouth daily   Left message to advise patient she is due to update labs.   Okay to refill Rinvoq ?

## 2024-03-03 ENCOUNTER — Encounter: Payer: Self-pay | Admitting: Radiology

## 2024-03-19 ENCOUNTER — Other Ambulatory Visit: Payer: Self-pay

## 2024-03-19 ENCOUNTER — Other Ambulatory Visit: Payer: Self-pay | Admitting: Physician Assistant

## 2024-03-19 DIAGNOSIS — Z79899 Other long term (current) drug therapy: Secondary | ICD-10-CM

## 2024-03-19 DIAGNOSIS — M0579 Rheumatoid arthritis with rheumatoid factor of multiple sites without organ or systems involvement: Secondary | ICD-10-CM

## 2024-03-20 ENCOUNTER — Other Ambulatory Visit: Payer: Self-pay

## 2024-03-21 ENCOUNTER — Other Ambulatory Visit: Payer: Self-pay

## 2024-03-24 LAB — GENECONNECT MOLECULAR SCREEN: Genetic Analysis Overall Interpretation: NEGATIVE

## 2024-03-25 ENCOUNTER — Other Ambulatory Visit: Payer: Self-pay

## 2024-04-28 NOTE — Progress Notes (Unsigned)
 "  Office Visit Note  Patient: Wendy Woods             Date of Birth: [birth year]           MRN: [id number]             PCP: Shelda Atlas, MD Referring: Shelda Atlas, MD Visit Date: 05/12/2024 Occupation: Data Unavailable  Subjective:  Medication monitoring   History of Present Illness: Wendy Woods is a 63 y.o. female with history of seropositive rheumatoid arthritis and bilateral scleritis. Patient remains on Rinvoq  15 mg 1 tablet by mouth daily.  She is tolerating Rinvoq  without any side effects and has not had any gaps in therapy.  She denies any signs or symptoms of a rheumatoid arthritis flare.  She has occasional arthralgias but overall her symptoms have been well-controlled.  Her morning stiffness has been lasting for about 10 minutes daily.  She denies any joint swelling at this time.  She denies any scleritis flares.  She denies any recent or recurrent infections.  She denies any new medical conditions.  Activities of Daily Living:  Patient reports morning stiffness for 10 minutes.   Patient Reports nocturnal pain.  Difficulty dressing/grooming: Reports Difficulty climbing stairs: Denies Difficulty getting out of chair: Reports Difficulty using hands for taps, buttons, cutlery, and/or writing: Reports  Review of Systems  Constitutional:  Positive for fatigue.  HENT:  Positive for mouth dryness. Negative for mouth sores.   Eyes:  Positive for dryness.  Respiratory:  Negative for shortness of breath.   Cardiovascular:  Negative for chest pain and palpitations.  Gastrointestinal:  Positive for constipation. Negative for blood in stool and diarrhea.  Endocrine: Negative for increased urination.  Genitourinary:  Negative for involuntary urination.  Musculoskeletal:  Positive for joint pain, joint pain, myalgias, morning stiffness, muscle tenderness and myalgias. Negative for gait problem, joint swelling and muscle weakness.  Skin:  Negative for color change, rash, hair  loss and sensitivity to sunlight.  Allergic/Immunologic: Negative for susceptible to infections.  Neurological:  Negative for dizziness and headaches.  Hematological:  Negative for swollen glands.  Psychiatric/Behavioral:  Positive for sleep disturbance. Negative for depressed mood. The patient is not nervous/anxious.     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   Angioedema 07/15/2014   Hypokalemia 07/15/2014   Diabetes mellitus, controlled (HCC) 07/03/2012   HTN (hypertension) 07/03/2012   Rheumatoid arthritis (HCC) 07/03/2012   Current chronic use of systemic steroids 07/03/2012   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 07/03/2012    Past Medical History:  Diagnosis Date   Asthma    as a child   DDD (degenerative disc disease), cervical 03/22/2016   DDD (degenerative disc disease), lumbar 03/22/2016   GERD (gastroesophageal reflux disease)    Hyperlipidemia    Hypertension    Rheumatoid arthritis (HCC)    Rheumatoid arthritis(714.0)    Scleritis 03/22/2016   Seasonal allergies    Type II diabetes mellitus (HCC)     Family History  Problem Relation Age of Onset   Hypertension Mother    Anxiety disorder Mother    Colon  polyps Father    Prostate cancer Father    Heart attack Maternal Grandmother    Tuberculosis Maternal Grandfather    Hypertension Paternal Grandmother    Stroke Paternal Grandmother    Diabetes Paternal Grandfather    Heart failure Paternal Grandfather    Breast cancer Cousin 26   Colon cancer Neg Hx    Past Surgical History:  Procedure Laterality Date   APPENDECTOMY  1968   CATARACT  EXTRACTION W/ INTRAOCULAR LENS  IMPLANT, BILATERAL Bilateral 11/2013-12/2013   KNEE ARTHROPLASTY     KNEE ARTHROSCOPY Left 1980's-1990's X 3   MYOMECTOMY  1990's   NASOPHARYNGEAL BIOPSY N/A 11/16/2021   Procedure: NASOPHARYNGEAL BIOPSY;  Surgeon: Carlie Clark, MD;  Location: [facility] OR;  Service: ENT;  Laterality: N/A;   Social History[1] Social History   Social History Narrative   Right Handed   Lives in a two story home      Immunization History  Administered Date(s) Administered   PFIZER(Purple Top)SARS-COV-2 Vaccination 04/23/2019, 05/12/2019, 01/28/2020     Objective: Vital Signs: BP 132/85   Pulse 60   Temp 97.7 F (36.5 C)   Resp 14   Ht 5' 4 (1.626 m)   Wt 147 lb 12.8 oz (67 kg)   LMP 07/09/2014   BMI 25.37 kg/m    Physical Exam Vitals and nursing note reviewed.  Constitutional:      Appearance: She is well-developed.  HENT:     Head: Normocephalic and atraumatic.  Eyes:     Conjunctiva/sclera: Conjunctivae normal.  Cardiovascular:     Rate and Rhythm: Normal rate and regular rhythm.     Heart sounds: Normal heart sounds.  Pulmonary:     Effort: Pulmonary effort is normal.     Breath sounds: Normal breath sounds.  Abdominal:     General: Bowel sounds are normal.     Palpations: Abdomen is soft.  Musculoskeletal:     Cervical back: Normal range of motion.  Lymphadenopathy:     Cervical: No cervical adenopathy.  Skin:    General: Skin is warm and dry.     Capillary Refill: Capillary refill takes less than 2 seconds.  Neurological:     Mental Status: She is alert and oriented to person, place, and time.  Psychiatric:        Behavior: Behavior normal.      Musculoskeletal Exam: C-spine has slightly limited range of motion without rotation especially to the left.  Left trapezius muscle tension and tenderness.  Thoracic spine and lumbar spine have good range of motion.  No midline spinal tenderness.  No SI joint tenderness.  Shoulder joints, elbow joints,  MCPs, PIPs, DIPs have good range of motion with no synovitis.  Limited extension of both wrist joints. Complete fist formation bilaterally.  Hip joints have good range of motion with no groin pain.  Knee joints have good range of motion no warmth or effusion.  Ankle joints have good range of motion no tenderness or joint swelling.  No evidence of Achilles tendinitis or plantar fasciitis.   CDAI Exam: CDAI Score: -- Patient Global: --; Provider Global: -- Swollen: --; Tender: -- Joint Exam 05/12/2024   No joint exam has been documented for this visit   There is currently no information documented on the homunculus. Go to the Rheumatology activity and complete the homunculus joint exam.  Investigation: No additional findings.  Imaging: No results found.  Recent Labs: Lab Results  Component Value Date   WBC 5.4 12/07/2023  HGB 11.5 (L) 12/07/2023   PLT 440 (H) 12/07/2023   NA 135 12/07/2023   K 3.9 12/07/2023   CL 94 (L) 12/07/2023   CO2 30 12/07/2023   GLUCOSE 374 (H) 12/07/2023   BUN 11 12/07/2023   CREATININE 0.85 12/07/2023   BILITOT 0.3 12/07/2023   ALKPHOS 60 12/20/2021   AST 15 12/07/2023   ALT 15 12/07/2023   PROT 7.4 12/07/2023   ALBUMIN 3.5 12/20/2021   CALCIUM 10.0 12/07/2023   GFRAA >60 12/17/2019   QFTBGOLD Negative 09/18/2016   QFTBGOLDPLUS NEGATIVE 02/09/2023    Speciality Comments: Remicade  6mg / kg every 5 weeks-ACY 12/25/19 MTX -dcd 04/21 due to GI SEs  Procedures:  No procedures performed Allergies: Latex, Lisinopril , Penicillins, and Methotrexate  and trimetrexate   Assessment / Plan:     Visit Diagnoses: Rheumatoid arthritis involving multiple sites with positive rheumatoid factor (HCC) - + RF, Anti-CCP +, ANA negative: She has no synovitis on examination today.  She has not had any signs or symptoms of a rheumatoid arthritis flare.  She is clinically doing well taking Rinvoq  15 mg 1 tablet by mouth daily.  She is tolerating Rinvoq  without any side  effects and has not had any gaps in therapy.  Her morning stiffness has been lasting for about 10 minutes daily.  No difficulty performing ADLs.  No recent or recurrent infections.  No new medical conditions.  Discussed increased risk for major cardiovascular events in patients on Jak inhibitors--discussed importance of modifying risk factors.  Plan to update lipid panel today along with CBC and CMP. She was advised to notify us  if she develops any new or worsening symptoms.  She will follow-up in the office in 5 months or sooner if needed.- Plan: Lipid panel  High risk medication use - Rinvoq  15 mg 1 tablet by mouth daily.  (d/c [name] on 11/28/2022 due to unreliable visual field).  CBC and CMP updated on 12/07/23.  Orders for CBC and CMP released today.  Lipid panel updated today.  TB gold negative on 02/09/23. Order for TB gold released today.  No recent or recurrent infections.  Discussed the importance of holding rinvoq  and arava  if she develops signs or symptoms of an infection and to resume once the infection has completely cleared.  - Plan: CBC with Differential/Platelet, Comprehensive metabolic panel with GFR, QuantiFERON-TB Gold Plus, Lipid panel  Screening for tuberculosis -order for TB gold released today.  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  No conjunctival injection.  No eye pain at this time.  She plans on scheduling a routine visit with Dr. McQuen for a diabetic eye exam.   Primary osteoarthritis of both hands: No synovitis. Complete fist formation bilaterally.    Trochanteric bursitis, right hip: Not currently symptomatic.    Primary osteoarthritis of both knees: She has good ROM of both knee joints with no warmth or effusion.    DDD (degenerative disc disease), cervical: C-spine has slightly limited ROM with lateral rotation to the left.  Left trapezius muscle tension and tenderness.    Degeneration of intervertebral disc of lumbar  region without discogenic back pain or lower extremity pain: No symptoms of radiculopathy.   Other medical conditions are listed as follows:  Osteopenia of multiple sites - DEXA updated on 06/06/21:LFN 0.843 g/cm2 with T-score of -1.4. DEXA 09/19/2023: LFN BMD 0.790 with T-score -1.8.  Right femoral neck BMD 0.832 with T-score -1.5.  History of vitamin D  deficiency  Essential hypertension:  Blood pressure was 132/85 today in the office.  History of diabetes mellitus, type II -plan to check lipid panel today with the patient is fasting.  Plan: Lipid panel  Anxiety  Mixed hyperlipidemia -plan to check lipid panel today with the patient is fasting.  Plan: Lipid panel  Orders: Orders Placed This Encounter  Procedures   CBC with Differential/Platelet   Comprehensive metabolic panel with GFR   QuantiFERON-TB Gold Plus   Lipid panel   No orders of the defined types were placed in this encounter.    Follow-Up Instructions: Return in about 5 months (around 10/10/2024) for Rheumatoid arthritis.   Waddell CHRISTELLA Craze, PA-C  Note - This record has been created using Dragon software.  Chart creation errors have been sought, but may not always  have been located. Such creation errors do not reflect on  the standard of medical care.     [1]  Social History Tobacco Use   Smoking status: Never    Passive exposure: Never   Smokeless tobacco: Never  Vaping Use   Vaping status: Never Used  Substance Use Topics   Alcohol use: No    Alcohol/week: 0.0 standard drinks of alcohol   Drug use: No   "

## 2024-05-05 ENCOUNTER — Telehealth: Payer: Self-pay | Admitting: Pharmacist

## 2024-05-05 NOTE — Telephone Encounter (Signed)
 Submitted a Prior Authorization renewal request to [employer] for RINVOQ  via CoverMyMeds. Will update once we receive a response.  Key: [id number]

## 2024-05-06 NOTE — Telephone Encounter (Signed)
 PA for Rinvoq  already approved through 04/30/2025  Case # [id number]

## 2024-05-12 ENCOUNTER — Ambulatory Visit: Attending: Physician Assistant | Admitting: Physician Assistant

## 2024-05-12 ENCOUNTER — Encounter: Payer: Self-pay | Admitting: Physician Assistant

## 2024-05-12 VITALS — BP 132/85 | HR 60 | Temp 97.7°F | Resp 14 | Ht 64.0 in | Wt 147.8 lb

## 2024-05-12 DIAGNOSIS — M7061 Trochanteric bursitis, right hip: Secondary | ICD-10-CM

## 2024-05-12 DIAGNOSIS — M170 Bilateral primary osteoarthritis of knee: Secondary | ICD-10-CM

## 2024-05-12 DIAGNOSIS — F419 Anxiety disorder, unspecified: Secondary | ICD-10-CM

## 2024-05-12 DIAGNOSIS — M51369 Other intervertebral disc degeneration, lumbar region without mention of lumbar back pain or lower extremity pain: Secondary | ICD-10-CM | POA: Diagnosis not present

## 2024-05-12 DIAGNOSIS — Z111 Encounter for screening for respiratory tuberculosis: Secondary | ICD-10-CM

## 2024-05-12 DIAGNOSIS — M8589 Other specified disorders of bone density and structure, multiple sites: Secondary | ICD-10-CM | POA: Diagnosis not present

## 2024-05-12 DIAGNOSIS — H15003 Unspecified scleritis, bilateral: Secondary | ICD-10-CM | POA: Diagnosis not present

## 2024-05-12 DIAGNOSIS — M5030 Other cervical disc degeneration, unspecified cervical region: Secondary | ICD-10-CM

## 2024-05-12 DIAGNOSIS — M0579 Rheumatoid arthritis with rheumatoid factor of multiple sites without organ or systems involvement: Secondary | ICD-10-CM

## 2024-05-12 DIAGNOSIS — Z8639 Personal history of other endocrine, nutritional and metabolic disease: Secondary | ICD-10-CM

## 2024-05-12 DIAGNOSIS — M19041 Primary osteoarthritis, right hand: Secondary | ICD-10-CM | POA: Diagnosis not present

## 2024-05-12 DIAGNOSIS — M19042 Primary osteoarthritis, left hand: Secondary | ICD-10-CM

## 2024-05-12 DIAGNOSIS — E782 Mixed hyperlipidemia: Secondary | ICD-10-CM

## 2024-05-12 DIAGNOSIS — Z79899 Other long term (current) drug therapy: Secondary | ICD-10-CM

## 2024-05-12 DIAGNOSIS — I10 Essential (primary) hypertension: Secondary | ICD-10-CM

## 2024-05-13 ENCOUNTER — Ambulatory Visit: Payer: Self-pay | Admitting: Physician Assistant

## 2024-05-13 NOTE — Progress Notes (Signed)
 Platelet count remains elevated but stable. Rest of CBC WNL.   Glucose is very elevated-379.  Sodium is low-133.  Total cholesterol is very elevated-338 and LDL is elevated-227.  Please forward results to PCP as requested.

## 2024-05-14 LAB — QUANTIFERON-TB GOLD PLUS
Mitogen-NIL: 1.78 [IU]/mL
NIL: 0.01 [IU]/mL
QuantiFERON-TB Gold Plus: NEGATIVE
TB1-NIL: 0.01 [IU]/mL
TB2-NIL: 0.02 [IU]/mL

## 2024-05-14 LAB — CBC WITH DIFFERENTIAL/PLATELET
Absolute Lymphocytes: 2149 {cells}/uL (ref 850–3900)
Absolute Monocytes: 373 {cells}/uL (ref 200–950)
Basophils Absolute: 38 {cells}/uL (ref 0–200)
Basophils Relative: 0.7 %
Eosinophils Absolute: 108 {cells}/uL (ref 15–500)
Eosinophils Relative: 2 %
HCT: 38.2 % (ref 35.9–46.0)
Hemoglobin: 12.7 g/dL (ref 11.7–15.5)
MCH: 29.6 pg (ref 27.0–33.0)
MCHC: 33.2 g/dL (ref 31.6–35.4)
MCV: 89 fL (ref 81.4–101.7)
MPV: 10 fL (ref 7.5–12.5)
Monocytes Relative: 6.9 %
Neutro Abs: 2732 {cells}/uL (ref 1500–7800)
Neutrophils Relative %: 50.6 %
Platelets: 435 Thousand/uL — ABNORMAL HIGH (ref 140–400)
RBC: 4.29 Million/uL (ref 3.80–5.10)
RDW: 12.1 % (ref 11.0–15.0)
Total Lymphocyte: 39.8 %
WBC: 5.4 Thousand/uL (ref 3.8–10.8)

## 2024-05-14 LAB — COMPREHENSIVE METABOLIC PANEL WITH GFR
AG Ratio: 1.2 (calc) (ref 1.0–2.5)
ALT: 17 U/L (ref 6–29)
AST: 17 U/L (ref 10–35)
Albumin: 4.3 g/dL (ref 3.6–5.1)
Alkaline phosphatase (APISO): 65 U/L (ref 37–153)
BUN: 12 mg/dL (ref 7–25)
CO2: 32 mmol/L (ref 20–32)
Calcium: 10.1 mg/dL (ref 8.6–10.4)
Chloride: 92 mmol/L — ABNORMAL LOW (ref 98–110)
Creat: 0.77 mg/dL (ref 0.50–1.05)
Globulin: 3.7 g/dL (ref 1.9–3.7)
Glucose, Bld: 379 mg/dL — ABNORMAL HIGH (ref 65–99)
Potassium: 4.4 mmol/L (ref 3.5–5.3)
Sodium: 133 mmol/L — ABNORMAL LOW (ref 135–146)
Total Bilirubin: 0.4 mg/dL (ref 0.2–1.2)
Total Protein: 8 g/dL (ref 6.1–8.1)
eGFR: 87 mL/min/1.73m2

## 2024-05-14 LAB — LIPID PANEL
Cholesterol: 338 mg/dL — ABNORMAL HIGH
HDL: 81 mg/dL
LDL Cholesterol (Calc): 227 mg/dL — ABNORMAL HIGH
Non-HDL Cholesterol (Calc): 257 mg/dL — ABNORMAL HIGH
Total CHOL/HDL Ratio: 4.2 (calc)
Triglycerides: 144 mg/dL

## 2024-05-15 NOTE — Progress Notes (Signed)
 TB gold negative

## 2024-05-28 ENCOUNTER — Other Ambulatory Visit: Payer: Self-pay | Admitting: Physician Assistant

## 2024-05-28 ENCOUNTER — Other Ambulatory Visit: Payer: Self-pay

## 2024-05-28 DIAGNOSIS — Z79899 Other long term (current) drug therapy: Secondary | ICD-10-CM

## 2024-05-28 DIAGNOSIS — M0579 Rheumatoid arthritis with rheumatoid factor of multiple sites without organ or systems involvement: Secondary | ICD-10-CM

## 2024-05-28 MED ORDER — RINVOQ 15 MG PO TB24
15.0000 mg | ORAL_TABLET | Freq: Every day | ORAL | 0 refills | Status: AC
  Filled 2024-05-28: qty 30, 30d supply, fill #0

## 2024-05-28 NOTE — Telephone Encounter (Signed)
 Last Fill: 02/28/2024  Labs: 05/12/2024 Platelet count remains elevated but stable. Rest of CBC WNL.   Glucose is very elevated-379.  Sodium is low-133.  Total cholesterol is very elevated-338 and LDL is elevated-227.   TB Gold: 05/12/2024 Neg    Next Visit: 10/10/2024  Last Visit: 05/12/2024  DX: Rheumatoid arthritis involving multiple sites with positive rheumatoid factor   Current Dose per office note 05/12/2024:  Rinvoq  15 mg 1 tablet by mouth daily.    Okay to refill Rinvoq ?

## 2024-05-28 NOTE — Progress Notes (Signed)
 Specialty Pharmacy Ongoing Clinical Assessment Note  ITA FRITZSCHE is a 64 y.o. female who is being followed by the specialty pharmacy service for RxSp Rheumatoid Arthritis   Patient's specialty medication(s) reviewed today: Upadacitinib  (Rinvoq )   Missed doses in the last 4 weeks: 0   Patient/Caregiver did not have any additional questions or concerns.   Therapeutic benefit summary: Patient is achieving benefit   Adverse events/side effects summary: No adverse events/side effects   Patient's therapy is appropriate to: Continue    Goals Addressed             This Visit's Progress    Minimize recurrence of flares   On track    Patient is on track. Patient will maintain adherence.  Patient reports that she is well-controlled on therapy at this time.          Follow up: 12 months  [facility]

## 2024-05-28 NOTE — Progress Notes (Signed)
 Specialty Pharmacy Refill Coordination Note  Wendy Woods is a 64 y.o. female contacted today regarding refills of specialty medication(s) Upadacitinib  (Rinvoq )   Patient requested Delivery   Delivery date: 05/30/24   Verified address: [address]  [place] [address]   Medication will be filled on: 05/29/24   This fill date is pending response to refill request from provider. Patient is aware and if they have not received fill by intended date they must follow up with pharmacy.

## 2024-05-29 ENCOUNTER — Other Ambulatory Visit: Payer: Self-pay

## 2024-06-03 ENCOUNTER — Other Ambulatory Visit: Payer: Self-pay

## 2024-06-04 ENCOUNTER — Other Ambulatory Visit: Payer: Self-pay

## 2024-06-06 ENCOUNTER — Other Ambulatory Visit: Payer: Self-pay

## 2024-10-10 ENCOUNTER — Ambulatory Visit: Admitting: Physician Assistant
